# Patient Record
Sex: Female | Born: 1940 | Race: White | Hispanic: No | Marital: Married | Smoking: Never smoker
Health system: Southern US, Community
[De-identification: ages and names within clinical notes are randomized; demographics above are authoritative.]

## PROBLEM LIST (undated history)

## (undated) DIAGNOSIS — I499 Cardiac arrhythmia, unspecified: Secondary | ICD-10-CM

## (undated) DIAGNOSIS — I4891 Unspecified atrial fibrillation: Secondary | ICD-10-CM

## (undated) DIAGNOSIS — D128 Benign neoplasm of rectum: Secondary | ICD-10-CM

## (undated) DIAGNOSIS — Z9221 Personal history of antineoplastic chemotherapy: Secondary | ICD-10-CM

## (undated) DIAGNOSIS — C9510 Chronic leukemia of unspecified cell type not having achieved remission: Secondary | ICD-10-CM

## (undated) DIAGNOSIS — E7800 Pure hypercholesterolemia, unspecified: Secondary | ICD-10-CM

## (undated) DIAGNOSIS — E0590 Thyrotoxicosis, unspecified without thyrotoxic crisis or storm: Secondary | ICD-10-CM

## (undated) DIAGNOSIS — D129 Benign neoplasm of anus and anal canal: Secondary | ICD-10-CM

## (undated) DIAGNOSIS — C8590 Non-Hodgkin lymphoma, unspecified, unspecified site: Secondary | ICD-10-CM

## (undated) DIAGNOSIS — I10 Essential (primary) hypertension: Secondary | ICD-10-CM

## (undated) HISTORY — DX: Essential (primary) hypertension: I10

## (undated) HISTORY — DX: Non-Hodgkin lymphoma, unspecified, unspecified site: C85.90

## (undated) HISTORY — DX: Unspecified atrial fibrillation: I48.91

## (undated) HISTORY — DX: Chronic leukemia of unspecified cell type not having achieved remission: C95.10

## (undated) HISTORY — PX: APPENDECTOMY: SHX54

## (undated) HISTORY — DX: Pure hypercholesterolemia, unspecified: E78.00

## (undated) HISTORY — DX: Thyrotoxicosis, unspecified without thyrotoxic crisis or storm: E05.90

## (undated) HISTORY — DX: Benign neoplasm of anus and anal canal: D12.9

## (undated) HISTORY — DX: Benign neoplasm of rectum: D12.8

## (undated) HISTORY — PX: COLONOSCOPY: SHX174

---
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
## 1968-01-23 HISTORY — PX: BREAST BIOPSY: SHX20

## 1988-01-23 HISTORY — PX: BREAST SURGERY: SHX581

## 1996-01-23 HISTORY — PX: FLEXIBLE SIGMOIDOSCOPY: SHX1649

## 1997-01-22 HISTORY — PX: CORONARY ARTERY BYPASS GRAFT: SHX141

## 1998-01-22 HISTORY — PX: BREAST CYST ASPIRATION: SHX578

## 2000-01-23 HISTORY — PX: BREAST BIOPSY: SHX20

## 2004-08-16 ENCOUNTER — Ambulatory Visit: Payer: Self-pay | Admitting: Family Medicine

## 2004-11-07 ENCOUNTER — Ambulatory Visit: Payer: Self-pay | Admitting: General Surgery

## 2005-01-22 DIAGNOSIS — C8590 Non-Hodgkin lymphoma, unspecified, unspecified site: Secondary | ICD-10-CM

## 2005-01-22 HISTORY — DX: Non-Hodgkin lymphoma, unspecified, unspecified site: C85.90

## 2005-07-06 ENCOUNTER — Other Ambulatory Visit: Payer: Self-pay

## 2005-08-06 ENCOUNTER — Ambulatory Visit: Payer: Self-pay | Admitting: General Surgery

## 2005-08-13 ENCOUNTER — Ambulatory Visit: Payer: Self-pay | Admitting: Oncology

## 2005-08-22 ENCOUNTER — Ambulatory Visit: Payer: Self-pay | Admitting: Oncology

## 2005-09-22 ENCOUNTER — Ambulatory Visit: Payer: Self-pay | Admitting: Oncology

## 2005-10-22 ENCOUNTER — Ambulatory Visit: Payer: Self-pay | Admitting: Oncology

## 2005-11-22 ENCOUNTER — Ambulatory Visit: Payer: Self-pay | Admitting: Oncology

## 2006-01-18 ENCOUNTER — Ambulatory Visit: Payer: Self-pay | Admitting: Nurse Practitioner

## 2006-01-22 DIAGNOSIS — C8590 Non-Hodgkin lymphoma, unspecified, unspecified site: Secondary | ICD-10-CM

## 2006-01-22 HISTORY — DX: Non-Hodgkin lymphoma, unspecified, unspecified site: C85.90

## 2006-02-01 ENCOUNTER — Ambulatory Visit: Payer: Self-pay | Admitting: Oncology

## 2006-02-22 ENCOUNTER — Ambulatory Visit: Payer: Self-pay | Admitting: Oncology

## 2006-04-24 ENCOUNTER — Ambulatory Visit: Payer: Self-pay | Admitting: Oncology

## 2006-04-26 ENCOUNTER — Ambulatory Visit: Payer: Self-pay | Admitting: Oncology

## 2006-05-23 ENCOUNTER — Ambulatory Visit: Payer: Self-pay | Admitting: Oncology

## 2006-06-03 ENCOUNTER — Ambulatory Visit: Payer: Self-pay | Admitting: Family Medicine

## 2006-07-19 ENCOUNTER — Ambulatory Visit: Payer: Self-pay | Admitting: Oncology

## 2006-07-23 ENCOUNTER — Ambulatory Visit: Payer: Self-pay | Admitting: Oncology

## 2006-09-23 ENCOUNTER — Ambulatory Visit: Payer: Self-pay | Admitting: Oncology

## 2006-10-11 ENCOUNTER — Ambulatory Visit: Payer: Self-pay | Admitting: Oncology

## 2006-10-23 ENCOUNTER — Ambulatory Visit: Payer: Self-pay | Admitting: Oncology

## 2006-12-23 ENCOUNTER — Ambulatory Visit: Payer: Self-pay | Admitting: Oncology

## 2007-01-03 ENCOUNTER — Ambulatory Visit: Payer: Self-pay | Admitting: Oncology

## 2007-01-23 ENCOUNTER — Ambulatory Visit: Payer: Self-pay | Admitting: Oncology

## 2007-03-23 ENCOUNTER — Ambulatory Visit: Payer: Self-pay | Admitting: Oncology

## 2007-03-26 ENCOUNTER — Ambulatory Visit: Payer: Self-pay | Admitting: Oncology

## 2007-04-23 ENCOUNTER — Ambulatory Visit: Payer: Self-pay | Admitting: Oncology

## 2007-05-23 ENCOUNTER — Ambulatory Visit: Payer: Self-pay | Admitting: Internal Medicine

## 2007-06-20 ENCOUNTER — Ambulatory Visit: Payer: Self-pay | Admitting: Oncology

## 2007-06-23 ENCOUNTER — Ambulatory Visit: Payer: Self-pay | Admitting: Oncology

## 2007-06-23 ENCOUNTER — Ambulatory Visit: Payer: Self-pay | Admitting: Internal Medicine

## 2007-09-19 ENCOUNTER — Ambulatory Visit: Payer: Self-pay | Admitting: Oncology

## 2007-09-23 ENCOUNTER — Ambulatory Visit: Payer: Self-pay | Admitting: Oncology

## 2007-10-30 ENCOUNTER — Ambulatory Visit: Payer: Self-pay | Admitting: Family Medicine

## 2007-12-01 ENCOUNTER — Ambulatory Visit: Payer: Self-pay | Admitting: General Surgery

## 2007-12-23 ENCOUNTER — Ambulatory Visit: Payer: Self-pay | Admitting: Oncology

## 2008-01-22 ENCOUNTER — Ambulatory Visit: Payer: Self-pay | Admitting: Oncology

## 2008-01-23 ENCOUNTER — Ambulatory Visit: Payer: Self-pay | Admitting: Oncology

## 2008-07-21 ENCOUNTER — Ambulatory Visit: Payer: Self-pay | Admitting: Oncology

## 2008-07-22 ENCOUNTER — Ambulatory Visit: Payer: Self-pay | Admitting: Oncology

## 2008-07-23 ENCOUNTER — Ambulatory Visit: Payer: Self-pay | Admitting: Oncology

## 2008-08-22 ENCOUNTER — Ambulatory Visit: Payer: Self-pay | Admitting: Oncology

## 2008-12-22 ENCOUNTER — Ambulatory Visit: Payer: Self-pay | Admitting: Oncology

## 2008-12-28 ENCOUNTER — Ambulatory Visit: Payer: Self-pay | Admitting: Family Medicine

## 2009-01-10 ENCOUNTER — Ambulatory Visit: Payer: Self-pay | Admitting: Oncology

## 2009-01-22 ENCOUNTER — Ambulatory Visit: Payer: Self-pay | Admitting: Oncology

## 2009-06-22 ENCOUNTER — Ambulatory Visit: Payer: Self-pay | Admitting: Oncology

## 2009-07-11 ENCOUNTER — Ambulatory Visit: Payer: Self-pay | Admitting: Oncology

## 2009-07-22 ENCOUNTER — Ambulatory Visit: Payer: Self-pay | Admitting: Oncology

## 2010-01-10 ENCOUNTER — Ambulatory Visit: Payer: Self-pay | Admitting: Oncology

## 2010-01-22 ENCOUNTER — Ambulatory Visit: Payer: Self-pay | Admitting: Oncology

## 2010-02-20 ENCOUNTER — Ambulatory Visit: Payer: Self-pay | Admitting: Family Medicine

## 2010-02-28 ENCOUNTER — Ambulatory Visit: Payer: Self-pay | Admitting: Family Medicine

## 2010-07-13 ENCOUNTER — Ambulatory Visit: Payer: Self-pay | Admitting: Oncology

## 2010-07-23 ENCOUNTER — Ambulatory Visit: Payer: Self-pay | Admitting: Oncology

## 2010-12-29 ENCOUNTER — Ambulatory Visit: Payer: Self-pay | Admitting: General Surgery

## 2010-12-29 LAB — HM COLONOSCOPY

## 2011-01-01 LAB — PATHOLOGY REPORT

## 2011-01-09 ENCOUNTER — Ambulatory Visit: Payer: Self-pay | Admitting: Oncology

## 2011-01-19 ENCOUNTER — Ambulatory Visit: Payer: Self-pay | Admitting: Oncology

## 2011-01-23 ENCOUNTER — Ambulatory Visit: Payer: Self-pay | Admitting: Oncology

## 2011-02-06 DIAGNOSIS — N83209 Unspecified ovarian cyst, unspecified side: Secondary | ICD-10-CM | POA: Diagnosis not present

## 2011-02-06 DIAGNOSIS — Z7982 Long term (current) use of aspirin: Secondary | ICD-10-CM | POA: Diagnosis not present

## 2011-02-06 DIAGNOSIS — Z79899 Other long term (current) drug therapy: Secondary | ICD-10-CM | POA: Diagnosis not present

## 2011-02-06 DIAGNOSIS — Z90710 Acquired absence of both cervix and uterus: Secondary | ICD-10-CM | POA: Diagnosis not present

## 2011-02-06 DIAGNOSIS — Z5111 Encounter for antineoplastic chemotherapy: Secondary | ICD-10-CM | POA: Diagnosis not present

## 2011-02-06 DIAGNOSIS — N838 Other noninflammatory disorders of ovary, fallopian tube and broad ligament: Secondary | ICD-10-CM | POA: Diagnosis not present

## 2011-02-06 DIAGNOSIS — Z951 Presence of aortocoronary bypass graft: Secondary | ICD-10-CM | POA: Diagnosis not present

## 2011-02-06 DIAGNOSIS — I10 Essential (primary) hypertension: Secondary | ICD-10-CM | POA: Diagnosis not present

## 2011-02-06 DIAGNOSIS — I2510 Atherosclerotic heart disease of native coronary artery without angina pectoris: Secondary | ICD-10-CM | POA: Diagnosis not present

## 2011-02-06 DIAGNOSIS — Z780 Asymptomatic menopausal state: Secondary | ICD-10-CM | POA: Diagnosis not present

## 2011-02-06 DIAGNOSIS — C8595 Non-Hodgkin lymphoma, unspecified, lymph nodes of inguinal region and lower limb: Secondary | ICD-10-CM | POA: Diagnosis not present

## 2011-02-07 DIAGNOSIS — I2510 Atherosclerotic heart disease of native coronary artery without angina pectoris: Secondary | ICD-10-CM | POA: Diagnosis not present

## 2011-02-07 DIAGNOSIS — Z951 Presence of aortocoronary bypass graft: Secondary | ICD-10-CM | POA: Diagnosis not present

## 2011-02-07 DIAGNOSIS — Z5111 Encounter for antineoplastic chemotherapy: Secondary | ICD-10-CM | POA: Diagnosis not present

## 2011-02-07 DIAGNOSIS — N83209 Unspecified ovarian cyst, unspecified side: Secondary | ICD-10-CM | POA: Diagnosis not present

## 2011-02-07 DIAGNOSIS — C8595 Non-Hodgkin lymphoma, unspecified, lymph nodes of inguinal region and lower limb: Secondary | ICD-10-CM | POA: Diagnosis not present

## 2011-02-14 DIAGNOSIS — I2510 Atherosclerotic heart disease of native coronary artery without angina pectoris: Secondary | ICD-10-CM | POA: Diagnosis not present

## 2011-02-14 DIAGNOSIS — N83209 Unspecified ovarian cyst, unspecified side: Secondary | ICD-10-CM | POA: Diagnosis not present

## 2011-02-14 DIAGNOSIS — Z951 Presence of aortocoronary bypass graft: Secondary | ICD-10-CM | POA: Diagnosis not present

## 2011-02-14 DIAGNOSIS — C8595 Non-Hodgkin lymphoma, unspecified, lymph nodes of inguinal region and lower limb: Secondary | ICD-10-CM | POA: Diagnosis not present

## 2011-02-14 DIAGNOSIS — Z5111 Encounter for antineoplastic chemotherapy: Secondary | ICD-10-CM | POA: Diagnosis not present

## 2011-02-14 LAB — CBC CANCER CENTER
Basophil %: 0.9 %
Eosinophil #: 0.1 x10 3/mm (ref 0.0–0.7)
Eosinophil %: 2.1 %
HGB: 13.9 g/dL (ref 12.0–16.0)
Lymphocyte #: 0.9 x10 3/mm — ABNORMAL LOW (ref 1.0–3.6)
MCH: 30.9 pg (ref 26.0–34.0)
MCHC: 34.7 g/dL (ref 32.0–36.0)
MCV: 89 fL (ref 80–100)
Monocyte #: 0.4 x10 3/mm (ref 0.0–0.7)
Monocyte %: 9.9 %
Neutrophil #: 2.7 x10 3/mm (ref 1.4–6.5)
Neutrophil %: 65.6 %
Platelet: 146 x10 3/mm — ABNORMAL LOW (ref 150–440)

## 2011-02-14 LAB — BASIC METABOLIC PANEL
Anion Gap: 6 — ABNORMAL LOW (ref 7–16)
BUN: 20 mg/dL — ABNORMAL HIGH (ref 7–18)
Chloride: 105 mmol/L (ref 98–107)
Creatinine: 1.01 mg/dL (ref 0.60–1.30)
EGFR (Non-African Amer.): 57 — ABNORMAL LOW
Potassium: 3.8 mmol/L (ref 3.5–5.1)
Sodium: 142 mmol/L (ref 136–145)

## 2011-02-21 DIAGNOSIS — I2510 Atherosclerotic heart disease of native coronary artery without angina pectoris: Secondary | ICD-10-CM | POA: Diagnosis not present

## 2011-02-21 DIAGNOSIS — C8595 Non-Hodgkin lymphoma, unspecified, lymph nodes of inguinal region and lower limb: Secondary | ICD-10-CM | POA: Diagnosis not present

## 2011-02-21 DIAGNOSIS — N83209 Unspecified ovarian cyst, unspecified side: Secondary | ICD-10-CM | POA: Diagnosis not present

## 2011-02-21 DIAGNOSIS — Z951 Presence of aortocoronary bypass graft: Secondary | ICD-10-CM | POA: Diagnosis not present

## 2011-02-21 DIAGNOSIS — Z5111 Encounter for antineoplastic chemotherapy: Secondary | ICD-10-CM | POA: Diagnosis not present

## 2011-02-21 LAB — CBC CANCER CENTER
Basophil #: 0 x10 3/mm (ref 0.0–0.1)
Basophil %: 0.7 %
Eosinophil #: 0.1 x10 3/mm (ref 0.0–0.7)
Eosinophil %: 2.3 %
HCT: 38.1 % (ref 35.0–47.0)
HGB: 13.4 g/dL (ref 12.0–16.0)
Lymphocyte #: 1.1 x10 3/mm (ref 1.0–3.6)
Lymphocyte %: 19.7 %
Monocyte %: 8.2 %
Neutrophil #: 3.8 x10 3/mm (ref 1.4–6.5)
RBC: 4.3 10*6/uL (ref 3.80–5.20)
RDW: 14 % (ref 11.5–14.5)
WBC: 5.6 x10 3/mm (ref 3.6–11.0)

## 2011-02-23 ENCOUNTER — Ambulatory Visit: Payer: Self-pay | Admitting: Oncology

## 2011-02-28 ENCOUNTER — Ambulatory Visit: Payer: Self-pay | Admitting: Oncology

## 2011-02-28 DIAGNOSIS — Z7982 Long term (current) use of aspirin: Secondary | ICD-10-CM | POA: Diagnosis not present

## 2011-02-28 DIAGNOSIS — N83209 Unspecified ovarian cyst, unspecified side: Secondary | ICD-10-CM | POA: Diagnosis not present

## 2011-02-28 DIAGNOSIS — I10 Essential (primary) hypertension: Secondary | ICD-10-CM | POA: Diagnosis not present

## 2011-02-28 DIAGNOSIS — Z5111 Encounter for antineoplastic chemotherapy: Secondary | ICD-10-CM | POA: Diagnosis not present

## 2011-02-28 DIAGNOSIS — Z90710 Acquired absence of both cervix and uterus: Secondary | ICD-10-CM | POA: Diagnosis not present

## 2011-02-28 DIAGNOSIS — Z79899 Other long term (current) drug therapy: Secondary | ICD-10-CM | POA: Diagnosis not present

## 2011-02-28 DIAGNOSIS — C8595 Non-Hodgkin lymphoma, unspecified, lymph nodes of inguinal region and lower limb: Secondary | ICD-10-CM | POA: Diagnosis not present

## 2011-02-28 DIAGNOSIS — I2510 Atherosclerotic heart disease of native coronary artery without angina pectoris: Secondary | ICD-10-CM | POA: Diagnosis not present

## 2011-02-28 DIAGNOSIS — Z951 Presence of aortocoronary bypass graft: Secondary | ICD-10-CM | POA: Diagnosis not present

## 2011-02-28 LAB — CBC CANCER CENTER
Eosinophil %: 2.2 %
HCT: 41.1 % (ref 35.0–47.0)
Lymphocyte #: 1.1 x10 3/mm (ref 1.0–3.6)
MCH: 30.8 pg (ref 26.0–34.0)
MCHC: 34.7 g/dL (ref 32.0–36.0)
MCV: 89 fL (ref 80–100)
Monocyte #: 0.4 x10 3/mm (ref 0.0–0.7)
Monocyte %: 7.8 %
Neutrophil #: 3.8 x10 3/mm (ref 1.4–6.5)
Platelet: 190 x10 3/mm (ref 150–440)
RBC: 4.64 10*6/uL (ref 3.80–5.20)
WBC: 5.5 x10 3/mm (ref 3.6–11.0)

## 2011-03-22 ENCOUNTER — Ambulatory Visit: Payer: Self-pay | Admitting: Family Medicine

## 2011-03-22 DIAGNOSIS — R928 Other abnormal and inconclusive findings on diagnostic imaging of breast: Secondary | ICD-10-CM | POA: Diagnosis not present

## 2011-03-22 DIAGNOSIS — Z1231 Encounter for screening mammogram for malignant neoplasm of breast: Secondary | ICD-10-CM | POA: Diagnosis not present

## 2011-03-23 ENCOUNTER — Ambulatory Visit: Payer: Self-pay | Admitting: Oncology

## 2011-04-10 DIAGNOSIS — Z7982 Long term (current) use of aspirin: Secondary | ICD-10-CM | POA: Diagnosis not present

## 2011-04-10 DIAGNOSIS — C8595 Non-Hodgkin lymphoma, unspecified, lymph nodes of inguinal region and lower limb: Secondary | ICD-10-CM | POA: Diagnosis not present

## 2011-04-10 DIAGNOSIS — N9489 Other specified conditions associated with female genital organs and menstrual cycle: Secondary | ICD-10-CM | POA: Diagnosis not present

## 2011-04-10 DIAGNOSIS — I2510 Atherosclerotic heart disease of native coronary artery without angina pectoris: Secondary | ICD-10-CM | POA: Diagnosis not present

## 2011-04-10 DIAGNOSIS — I10 Essential (primary) hypertension: Secondary | ICD-10-CM | POA: Diagnosis not present

## 2011-04-10 DIAGNOSIS — Z79899 Other long term (current) drug therapy: Secondary | ICD-10-CM | POA: Diagnosis not present

## 2011-04-10 DIAGNOSIS — Z780 Asymptomatic menopausal state: Secondary | ICD-10-CM | POA: Diagnosis not present

## 2011-04-10 DIAGNOSIS — Z951 Presence of aortocoronary bypass graft: Secondary | ICD-10-CM | POA: Diagnosis not present

## 2011-04-10 DIAGNOSIS — N838 Other noninflammatory disorders of ovary, fallopian tube and broad ligament: Secondary | ICD-10-CM | POA: Diagnosis not present

## 2011-04-10 DIAGNOSIS — Z90710 Acquired absence of both cervix and uterus: Secondary | ICD-10-CM | POA: Diagnosis not present

## 2011-04-23 ENCOUNTER — Ambulatory Visit: Payer: Self-pay | Admitting: Oncology

## 2011-05-21 DIAGNOSIS — I2510 Atherosclerotic heart disease of native coronary artery without angina pectoris: Secondary | ICD-10-CM | POA: Diagnosis not present

## 2011-05-21 DIAGNOSIS — E7800 Pure hypercholesterolemia, unspecified: Secondary | ICD-10-CM | POA: Diagnosis not present

## 2011-05-21 DIAGNOSIS — Z23 Encounter for immunization: Secondary | ICD-10-CM | POA: Diagnosis not present

## 2011-05-21 DIAGNOSIS — I10 Essential (primary) hypertension: Secondary | ICD-10-CM | POA: Diagnosis not present

## 2011-07-12 ENCOUNTER — Ambulatory Visit: Payer: Self-pay | Admitting: Oncology

## 2011-07-12 DIAGNOSIS — Z9221 Personal history of antineoplastic chemotherapy: Secondary | ICD-10-CM | POA: Diagnosis not present

## 2011-07-12 DIAGNOSIS — I2510 Atherosclerotic heart disease of native coronary artery without angina pectoris: Secondary | ICD-10-CM | POA: Diagnosis not present

## 2011-07-12 DIAGNOSIS — Z79899 Other long term (current) drug therapy: Secondary | ICD-10-CM | POA: Diagnosis not present

## 2011-07-12 DIAGNOSIS — Z7982 Long term (current) use of aspirin: Secondary | ICD-10-CM | POA: Diagnosis not present

## 2011-07-12 DIAGNOSIS — I10 Essential (primary) hypertension: Secondary | ICD-10-CM | POA: Diagnosis not present

## 2011-07-12 DIAGNOSIS — C8595 Non-Hodgkin lymphoma, unspecified, lymph nodes of inguinal region and lower limb: Secondary | ICD-10-CM | POA: Diagnosis not present

## 2011-07-12 DIAGNOSIS — R599 Enlarged lymph nodes, unspecified: Secondary | ICD-10-CM | POA: Diagnosis not present

## 2011-07-12 DIAGNOSIS — Z951 Presence of aortocoronary bypass graft: Secondary | ICD-10-CM | POA: Diagnosis not present

## 2011-07-12 LAB — CBC CANCER CENTER
Eosinophil #: 0.1 x10 3/mm (ref 0.0–0.7)
Eosinophil %: 1.6 %
HCT: 39.3 % (ref 35.0–47.0)
HGB: 13.1 g/dL (ref 12.0–16.0)
Lymphocyte #: 0.9 x10 3/mm — ABNORMAL LOW (ref 1.0–3.6)
Lymphocyte %: 24.4 %
MCH: 29.7 pg (ref 26.0–34.0)
MCHC: 33.4 g/dL (ref 32.0–36.0)
Monocyte #: 0.4 x10 3/mm (ref 0.2–0.9)
Monocyte %: 10.5 %
Neutrophil #: 2.4 x10 3/mm (ref 1.4–6.5)
Platelet: 122 x10 3/mm — ABNORMAL LOW (ref 150–440)
RDW: 13.8 % (ref 11.5–14.5)
WBC: 3.8 x10 3/mm (ref 3.6–11.0)

## 2011-07-12 LAB — COMPREHENSIVE METABOLIC PANEL
Albumin: 3.9 g/dL (ref 3.4–5.0)
Alkaline Phosphatase: 57 U/L (ref 50–136)
Anion Gap: 5 — ABNORMAL LOW (ref 7–16)
Bilirubin,Total: 0.6 mg/dL (ref 0.2–1.0)
Calcium, Total: 10.1 mg/dL (ref 8.5–10.1)
Co2: 30 mmol/L (ref 21–32)
Creatinine: 0.95 mg/dL (ref 0.60–1.30)
EGFR (African American): >60
Glucose: 123 mg/dL — ABNORMAL HIGH (ref 65–99)
Potassium: 3.7 mmol/L (ref 3.5–5.1)
SGOT(AST): 29 U/L (ref 15–37)
SGPT (ALT): 31 U/L
Sodium: 141 mmol/L (ref 136–145)

## 2011-07-12 LAB — SEDIMENTATION RATE: Erythrocyte Sed Rate: 3 mm/hr (ref 0–30)

## 2011-07-23 ENCOUNTER — Ambulatory Visit: Payer: Self-pay | Admitting: Oncology

## 2011-07-24 DIAGNOSIS — I10 Essential (primary) hypertension: Secondary | ICD-10-CM | POA: Diagnosis not present

## 2011-07-24 DIAGNOSIS — C8295 Follicular lymphoma, unspecified, lymph nodes of inguinal region and lower limb: Secondary | ICD-10-CM | POA: Diagnosis not present

## 2011-07-24 DIAGNOSIS — I2510 Atherosclerotic heart disease of native coronary artery without angina pectoris: Secondary | ICD-10-CM | POA: Diagnosis not present

## 2011-07-24 DIAGNOSIS — Z5111 Encounter for antineoplastic chemotherapy: Secondary | ICD-10-CM | POA: Diagnosis not present

## 2011-07-24 DIAGNOSIS — G479 Sleep disorder, unspecified: Secondary | ICD-10-CM | POA: Diagnosis not present

## 2011-07-24 DIAGNOSIS — Z7982 Long term (current) use of aspirin: Secondary | ICD-10-CM | POA: Diagnosis not present

## 2011-07-24 DIAGNOSIS — Z79899 Other long term (current) drug therapy: Secondary | ICD-10-CM | POA: Diagnosis not present

## 2011-08-16 DIAGNOSIS — Z5111 Encounter for antineoplastic chemotherapy: Secondary | ICD-10-CM | POA: Diagnosis not present

## 2011-08-16 DIAGNOSIS — G479 Sleep disorder, unspecified: Secondary | ICD-10-CM | POA: Diagnosis not present

## 2011-08-16 DIAGNOSIS — C8295 Follicular lymphoma, unspecified, lymph nodes of inguinal region and lower limb: Secondary | ICD-10-CM | POA: Diagnosis not present

## 2011-08-16 DIAGNOSIS — I10 Essential (primary) hypertension: Secondary | ICD-10-CM | POA: Diagnosis not present

## 2011-08-16 DIAGNOSIS — Z7982 Long term (current) use of aspirin: Secondary | ICD-10-CM | POA: Diagnosis not present

## 2011-08-16 DIAGNOSIS — I2510 Atherosclerotic heart disease of native coronary artery without angina pectoris: Secondary | ICD-10-CM | POA: Diagnosis not present

## 2011-08-16 LAB — BASIC METABOLIC PANEL
Anion Gap: 9 (ref 7–16)
BUN: 38 mg/dL — ABNORMAL HIGH (ref 7–18)
Chloride: 103 mmol/L (ref 98–107)
Co2: 30 mmol/L (ref 21–32)
Creatinine: 1.17 mg/dL (ref 0.60–1.30)
Potassium: 4.1 mmol/L (ref 3.5–5.1)
Sodium: 142 mmol/L (ref 136–145)

## 2011-08-16 LAB — CBC CANCER CENTER
Basophil #: 0 x10 3/mm (ref 0.0–0.1)
Basophil %: 0.8 %
Eosinophil %: 2.4 %
HGB: 13.8 g/dL (ref 12.0–16.0)
Lymphocyte #: 1.1 x10 3/mm (ref 1.0–3.6)
Lymphocyte %: 24.6 %
MCH: 30.6 pg (ref 26.0–34.0)
MCV: 89 fL (ref 80–100)
Monocyte #: 0.4 x10 3/mm (ref 0.2–0.9)
Monocyte %: 10.1 %
Neutrophil %: 62.1 %
Platelet: 151 x10 3/mm (ref 150–440)
RBC: 4.5 10*6/uL (ref 3.80–5.20)

## 2011-08-23 ENCOUNTER — Ambulatory Visit: Payer: Self-pay | Admitting: Oncology

## 2011-10-18 ENCOUNTER — Ambulatory Visit: Payer: Self-pay | Admitting: Oncology

## 2011-10-18 DIAGNOSIS — Z951 Presence of aortocoronary bypass graft: Secondary | ICD-10-CM | POA: Diagnosis not present

## 2011-10-18 DIAGNOSIS — Z79899 Other long term (current) drug therapy: Secondary | ICD-10-CM | POA: Diagnosis not present

## 2011-10-18 DIAGNOSIS — Z5111 Encounter for antineoplastic chemotherapy: Secondary | ICD-10-CM | POA: Diagnosis not present

## 2011-10-18 DIAGNOSIS — I2510 Atherosclerotic heart disease of native coronary artery without angina pectoris: Secondary | ICD-10-CM | POA: Diagnosis not present

## 2011-10-18 DIAGNOSIS — C8295 Follicular lymphoma, unspecified, lymph nodes of inguinal region and lower limb: Secondary | ICD-10-CM | POA: Diagnosis not present

## 2011-10-18 DIAGNOSIS — I10 Essential (primary) hypertension: Secondary | ICD-10-CM | POA: Diagnosis not present

## 2011-10-18 DIAGNOSIS — Z7982 Long term (current) use of aspirin: Secondary | ICD-10-CM | POA: Diagnosis not present

## 2011-10-18 LAB — CBC CANCER CENTER
Basophil #: 0 x10 3/mm (ref 0.0–0.1)
Basophil %: 1.1 %
Eosinophil #: 0.1 x10 3/mm (ref 0.0–0.7)
Eosinophil %: 2.6 %
HGB: 12.9 g/dL (ref 12.0–16.0)
Lymphocyte %: 23.9 %
MCHC: 32.9 g/dL (ref 32.0–36.0)
Monocyte #: 0.5 x10 3/mm (ref 0.2–0.9)
Monocyte %: 11.5 %
Neutrophil #: 2.6 x10 3/mm (ref 1.4–6.5)
Platelet: 131 x10 3/mm — ABNORMAL LOW (ref 150–440)
RDW: 14 % (ref 11.5–14.5)
WBC: 4.2 x10 3/mm (ref 3.6–11.0)

## 2011-10-18 LAB — COMPREHENSIVE METABOLIC PANEL
Anion Gap: 6 — ABNORMAL LOW (ref 7–16)
Calcium, Total: 9.9 mg/dL (ref 8.5–10.1)
Chloride: 105 mmol/L (ref 98–107)
Co2: 28 mmol/L (ref 21–32)
EGFR (African American): 55 — ABNORMAL LOW
EGFR (Non-African Amer.): 47 — ABNORMAL LOW
SGOT(AST): 29 U/L (ref 15–37)
SGPT (ALT): 35 U/L (ref 12–78)
Sodium: 139 mmol/L (ref 136–145)

## 2011-10-18 LAB — SEDIMENTATION RATE: Erythrocyte Sed Rate: 4 mm/hr (ref 0–30)

## 2011-10-18 LAB — LACTATE DEHYDROGENASE: LDH: 193 U/L (ref 81–246)

## 2011-10-23 ENCOUNTER — Ambulatory Visit: Payer: Self-pay | Admitting: Oncology

## 2011-11-20 DIAGNOSIS — I2510 Atherosclerotic heart disease of native coronary artery without angina pectoris: Secondary | ICD-10-CM | POA: Diagnosis not present

## 2011-11-20 DIAGNOSIS — Z0000 Encounter for general adult medical examination without abnormal findings: Secondary | ICD-10-CM | POA: Diagnosis not present

## 2011-11-20 DIAGNOSIS — Z1339 Encounter for screening examination for other mental health and behavioral disorders: Secondary | ICD-10-CM | POA: Diagnosis not present

## 2011-11-20 DIAGNOSIS — I10 Essential (primary) hypertension: Secondary | ICD-10-CM | POA: Diagnosis not present

## 2011-11-20 DIAGNOSIS — Z23 Encounter for immunization: Secondary | ICD-10-CM | POA: Diagnosis not present

## 2011-11-20 DIAGNOSIS — Z1331 Encounter for screening for depression: Secondary | ICD-10-CM | POA: Diagnosis not present

## 2011-12-17 DIAGNOSIS — Z1331 Encounter for screening for depression: Secondary | ICD-10-CM | POA: Diagnosis not present

## 2011-12-17 DIAGNOSIS — Z23 Encounter for immunization: Secondary | ICD-10-CM | POA: Diagnosis not present

## 2011-12-27 ENCOUNTER — Ambulatory Visit: Payer: Self-pay | Admitting: Oncology

## 2011-12-27 DIAGNOSIS — Z5111 Encounter for antineoplastic chemotherapy: Secondary | ICD-10-CM | POA: Diagnosis not present

## 2011-12-27 DIAGNOSIS — C8295 Follicular lymphoma, unspecified, lymph nodes of inguinal region and lower limb: Secondary | ICD-10-CM | POA: Diagnosis not present

## 2011-12-27 DIAGNOSIS — Z79899 Other long term (current) drug therapy: Secondary | ICD-10-CM | POA: Diagnosis not present

## 2011-12-27 DIAGNOSIS — I2510 Atherosclerotic heart disease of native coronary artery without angina pectoris: Secondary | ICD-10-CM | POA: Diagnosis not present

## 2011-12-27 DIAGNOSIS — Z90710 Acquired absence of both cervix and uterus: Secondary | ICD-10-CM | POA: Diagnosis not present

## 2011-12-27 DIAGNOSIS — I10 Essential (primary) hypertension: Secondary | ICD-10-CM | POA: Diagnosis not present

## 2011-12-27 LAB — COMPREHENSIVE METABOLIC PANEL
Albumin: 3.9 g/dL (ref 3.4–5.0)
Anion Gap: 10 (ref 7–16)
BUN: 18 mg/dL (ref 7–18)
Calcium, Total: 10.4 mg/dL — ABNORMAL HIGH (ref 8.5–10.1)
Co2: 28 mmol/L (ref 21–32)
Creatinine: 1.02 mg/dL (ref 0.60–1.30)
EGFR (African American): >60
Glucose: 118 mg/dL — ABNORMAL HIGH (ref 65–99)
Osmolality: 286 (ref 275–301)
Potassium: 3.7 mmol/L (ref 3.5–5.1)
SGOT(AST): 30 U/L (ref 15–37)
Sodium: 142 mmol/L (ref 136–145)

## 2011-12-27 LAB — CBC CANCER CENTER
Basophil #: 0.1 x10 3/mm (ref 0.0–0.1)
Basophil %: 1 %
Eosinophil #: 0.1 x10 3/mm (ref 0.0–0.7)
HCT: 39.6 % (ref 35.0–47.0)
HGB: 13.8 g/dL (ref 12.0–16.0)
Lymphocyte #: 1.1 x10 3/mm (ref 1.0–3.6)
Lymphocyte %: 23.1 %
MCH: 30.7 pg (ref 26.0–34.0)
MCHC: 34.8 g/dL (ref 32.0–36.0)
MCV: 88 fL (ref 80–100)
Monocyte #: 0.4 x10 3/mm (ref 0.2–0.9)
Neutrophil #: 3.2 x10 3/mm (ref 1.4–6.5)
RDW: 14.1 % (ref 11.5–14.5)
WBC: 4.9 x10 3/mm (ref 3.6–11.0)

## 2011-12-27 LAB — SEDIMENTATION RATE: Erythrocyte Sed Rate: 4 mm/hr (ref 0–30)

## 2012-01-21 DIAGNOSIS — I10 Essential (primary) hypertension: Secondary | ICD-10-CM | POA: Diagnosis not present

## 2012-01-21 DIAGNOSIS — I059 Rheumatic mitral valve disease, unspecified: Secondary | ICD-10-CM | POA: Diagnosis not present

## 2012-01-21 DIAGNOSIS — E782 Mixed hyperlipidemia: Secondary | ICD-10-CM | POA: Diagnosis not present

## 2012-01-21 DIAGNOSIS — I25810 Atherosclerosis of coronary artery bypass graft(s) without angina pectoris: Secondary | ICD-10-CM | POA: Diagnosis not present

## 2012-01-23 ENCOUNTER — Ambulatory Visit: Payer: Self-pay | Admitting: Oncology

## 2012-01-23 HISTORY — PX: CARDIAC CATHETERIZATION: SHX172

## 2012-01-23 HISTORY — PX: BREAST BIOPSY: SHX20

## 2012-02-23 ENCOUNTER — Ambulatory Visit: Payer: Self-pay | Admitting: Oncology

## 2012-02-23 DIAGNOSIS — Z5111 Encounter for antineoplastic chemotherapy: Secondary | ICD-10-CM | POA: Diagnosis not present

## 2012-02-23 DIAGNOSIS — I2510 Atherosclerotic heart disease of native coronary artery without angina pectoris: Secondary | ICD-10-CM | POA: Diagnosis not present

## 2012-02-23 DIAGNOSIS — Z79899 Other long term (current) drug therapy: Secondary | ICD-10-CM | POA: Diagnosis not present

## 2012-02-23 DIAGNOSIS — Z7982 Long term (current) use of aspirin: Secondary | ICD-10-CM | POA: Diagnosis not present

## 2012-02-23 DIAGNOSIS — C8295 Follicular lymphoma, unspecified, lymph nodes of inguinal region and lower limb: Secondary | ICD-10-CM | POA: Diagnosis not present

## 2012-02-23 DIAGNOSIS — Z90710 Acquired absence of both cervix and uterus: Secondary | ICD-10-CM | POA: Diagnosis not present

## 2012-02-23 DIAGNOSIS — I10 Essential (primary) hypertension: Secondary | ICD-10-CM | POA: Diagnosis not present

## 2012-02-28 DIAGNOSIS — Z90710 Acquired absence of both cervix and uterus: Secondary | ICD-10-CM | POA: Diagnosis not present

## 2012-02-28 DIAGNOSIS — C8295 Follicular lymphoma, unspecified, lymph nodes of inguinal region and lower limb: Secondary | ICD-10-CM | POA: Diagnosis not present

## 2012-02-28 LAB — COMPREHENSIVE METABOLIC PANEL
Albumin: 4 g/dL (ref 3.4–5.0)
Anion Gap: 7 (ref 7–16)
Calcium, Total: 10.2 mg/dL — ABNORMAL HIGH (ref 8.5–10.1)
Chloride: 103 mmol/L (ref 98–107)
Creatinine: 0.98 mg/dL (ref 0.60–1.30)
EGFR (African American): >60
EGFR (Non-African Amer.): 57 — ABNORMAL LOW
Glucose: 96 mg/dL (ref 65–99)
Osmolality: 284 (ref 275–301)
Potassium: 3.9 mmol/L (ref 3.5–5.1)
SGPT (ALT): 27 U/L (ref 12–78)
Total Protein: 7.5 g/dL (ref 6.4–8.2)

## 2012-02-28 LAB — CBC CANCER CENTER
Basophil #: 0 x10 3/mm (ref 0.0–0.1)
Eosinophil %: 1.7 %
HCT: 42.1 % (ref 35.0–47.0)
HGB: 14.5 g/dL (ref 12.0–16.0)
Lymphocyte #: 1.1 x10 3/mm (ref 1.0–3.6)
Lymphocyte %: 25 %
MCHC: 34.6 g/dL (ref 32.0–36.0)
Monocyte #: 0.4 x10 3/mm (ref 0.2–0.9)
Monocyte %: 9.7 %
Neutrophil #: 2.6 x10 3/mm (ref 1.4–6.5)
Platelet: 148 x10 3/mm — ABNORMAL LOW (ref 150–440)
RBC: 4.84 10*6/uL (ref 3.80–5.20)
WBC: 4.2 x10 3/mm (ref 3.6–11.0)

## 2012-03-22 ENCOUNTER — Ambulatory Visit: Payer: Self-pay | Admitting: Oncology

## 2012-04-16 ENCOUNTER — Ambulatory Visit: Payer: Self-pay | Admitting: Family Medicine

## 2012-04-16 DIAGNOSIS — Z1231 Encounter for screening mammogram for malignant neoplasm of breast: Secondary | ICD-10-CM | POA: Diagnosis not present

## 2012-04-16 DIAGNOSIS — N6459 Other signs and symptoms in breast: Secondary | ICD-10-CM | POA: Diagnosis not present

## 2012-04-22 ENCOUNTER — Ambulatory Visit: Payer: Self-pay | Admitting: Oncology

## 2012-04-22 DIAGNOSIS — Z90710 Acquired absence of both cervix and uterus: Secondary | ICD-10-CM | POA: Diagnosis not present

## 2012-04-22 DIAGNOSIS — Z951 Presence of aortocoronary bypass graft: Secondary | ICD-10-CM | POA: Diagnosis not present

## 2012-04-22 DIAGNOSIS — Z7982 Long term (current) use of aspirin: Secondary | ICD-10-CM | POA: Diagnosis not present

## 2012-04-22 DIAGNOSIS — Z79899 Other long term (current) drug therapy: Secondary | ICD-10-CM | POA: Diagnosis not present

## 2012-04-22 DIAGNOSIS — Z5111 Encounter for antineoplastic chemotherapy: Secondary | ICD-10-CM | POA: Diagnosis not present

## 2012-04-22 DIAGNOSIS — R928 Other abnormal and inconclusive findings on diagnostic imaging of breast: Secondary | ICD-10-CM | POA: Diagnosis not present

## 2012-04-22 DIAGNOSIS — N6489 Other specified disorders of breast: Secondary | ICD-10-CM | POA: Diagnosis not present

## 2012-04-22 DIAGNOSIS — I10 Essential (primary) hypertension: Secondary | ICD-10-CM | POA: Diagnosis not present

## 2012-04-22 DIAGNOSIS — I2510 Atherosclerotic heart disease of native coronary artery without angina pectoris: Secondary | ICD-10-CM | POA: Diagnosis not present

## 2012-04-22 DIAGNOSIS — C8295 Follicular lymphoma, unspecified, lymph nodes of inguinal region and lower limb: Secondary | ICD-10-CM | POA: Diagnosis not present

## 2012-05-01 ENCOUNTER — Ambulatory Visit: Payer: Self-pay | Admitting: Family Medicine

## 2012-05-01 DIAGNOSIS — N6489 Other specified disorders of breast: Secondary | ICD-10-CM | POA: Diagnosis not present

## 2012-05-01 DIAGNOSIS — R928 Other abnormal and inconclusive findings on diagnostic imaging of breast: Secondary | ICD-10-CM | POA: Diagnosis not present

## 2012-05-01 DIAGNOSIS — N63 Unspecified lump in unspecified breast: Secondary | ICD-10-CM | POA: Diagnosis not present

## 2012-05-01 DIAGNOSIS — C8295 Follicular lymphoma, unspecified, lymph nodes of inguinal region and lower limb: Secondary | ICD-10-CM | POA: Diagnosis not present

## 2012-05-01 LAB — COMPREHENSIVE METABOLIC PANEL
Albumin: 3.9 g/dL (ref 3.4–5.0)
BUN: 24 mg/dL — ABNORMAL HIGH (ref 7–18)
Bilirubin,Total: 0.5 mg/dL (ref 0.2–1.0)
Calcium, Total: 9.9 mg/dL (ref 8.5–10.1)
Chloride: 102 mmol/L (ref 98–107)
EGFR (Non-African Amer.): 47 — ABNORMAL LOW
Glucose: 109 mg/dL — ABNORMAL HIGH (ref 65–99)
Osmolality: 282 (ref 275–301)
Potassium: 3.9 mmol/L (ref 3.5–5.1)
SGPT (ALT): 27 U/L (ref 12–78)
Total Protein: 7.6 g/dL (ref 6.4–8.2)

## 2012-05-01 LAB — CBC CANCER CENTER
Basophil #: 0 x10 3/mm (ref 0.0–0.1)
Basophil %: 1.1 %
Eosinophil %: 1.3 %
Lymphocyte #: 1.3 x10 3/mm (ref 1.0–3.6)
MCH: 30.1 pg (ref 26.0–34.0)
MCHC: 34.4 g/dL (ref 32.0–36.0)
MCV: 88 fL (ref 80–100)
Monocyte #: 0.4 x10 3/mm (ref 0.2–0.9)
Neutrophil %: 59.9 %
Platelet: 201 x10 3/mm (ref 150–440)
RBC: 4.73 10*6/uL (ref 3.80–5.20)
RDW: 13.8 % (ref 11.5–14.5)
WBC: 4.6 x10 3/mm (ref 3.6–11.0)

## 2012-05-08 ENCOUNTER — Ambulatory Visit: Payer: Medicare Other

## 2012-05-08 ENCOUNTER — Other Ambulatory Visit: Payer: Self-pay | Admitting: *Deleted

## 2012-05-08 ENCOUNTER — Encounter: Payer: Self-pay | Admitting: General Surgery

## 2012-05-08 ENCOUNTER — Ambulatory Visit (INDEPENDENT_AMBULATORY_CARE_PROVIDER_SITE_OTHER): Payer: Medicare Other | Admitting: General Surgery

## 2012-05-08 VITALS — BP 132/68 | HR 76 | Resp 14 | Ht 65.0 in | Wt 157.0 lb

## 2012-05-08 DIAGNOSIS — N6029 Fibroadenosis of unspecified breast: Secondary | ICD-10-CM | POA: Diagnosis not present

## 2012-05-08 DIAGNOSIS — N6310 Unspecified lump in the right breast, unspecified quadrant: Secondary | ICD-10-CM

## 2012-05-08 DIAGNOSIS — N6019 Diffuse cystic mastopathy of unspecified breast: Secondary | ICD-10-CM | POA: Diagnosis not present

## 2012-05-08 DIAGNOSIS — N6009 Solitary cyst of unspecified breast: Secondary | ICD-10-CM | POA: Diagnosis not present

## 2012-05-08 DIAGNOSIS — N63 Unspecified lump in unspecified breast: Secondary | ICD-10-CM | POA: Diagnosis not present

## 2012-05-08 DIAGNOSIS — D249 Benign neoplasm of unspecified breast: Secondary | ICD-10-CM | POA: Diagnosis not present

## 2012-05-08 HISTORY — PX: BREAST SURGERY: SHX581

## 2012-05-08 NOTE — Progress Notes (Signed)
The patient has been asked to return to the office in six months for a unilateral right breast diagnostic mammogram. 

## 2012-05-08 NOTE — Patient Instructions (Addendum)
Continue to do self breast exams. Call for any new breast changes or concerns. The breast biopsy procedure was reviewed with the patient. The potential for bleeding, infection, and pain was reviewed. At this time, the benefits outweigh the risk and the patient is amenable to proceed.     CARE AFTER BREAST BIOPSY  1. Leave the dressing on that your doctor applied after surgery. It is waterproof. You may bathe, shower and/or swim. The dressing will probably remain intact until your return office visit. If the dressing comes off, you will see small strips of tape against your skin on the incision. Do not remove these strips.  2. You may want to use a gauze,cloth or similar protection in your bra to prevent rubbing against your dressing and incision. This is not necessary, but you may feel more comfortable doing so.  3. It is recommended that you wear a bra day and night to give support to the breast. This will prevent the weight of the breast from pulling on the incision.  4. Your breast will feel hard and lumpy under the incision. Do not be alarmed. This is the underlying stitching of tissue. Softening of this tissue will occur in time.  5. Make sure you call the office and schedule an appointment in one week after your surgery. The office phone number is [phone]. The [facility] may have already done this for you.  6. You will notice about a week after your office visit that the strips of the tape on your incision will begin to loosen. These may then be removed.  7. Report to your doctor any of the following:  * Severe pain not relieved by your pain medication  *Redness of the incision  * Drainage from the incision  *Fever greater than 101 degrees

## 2012-05-08 NOTE — Progress Notes (Signed)
Patient ID: Jacqueline Webb, female   DOB: [birth year], 72 y.o.   MRN: [id number]  Chief Complaint  Patient presents with  . Other    mammogram    HPI Jacqueline Webb is a 72 y.o. female here today for an abnormal mammogram done on 05-01-12.  States she really "can't feel anything" in the breast. An ultrasound was done as well.   HPI  Past Medical History  Diagnosis Date  . Hypertension   . Hypercholesteremia   . Lymphoma 2007    Past Surgical History  Procedure Laterality Date  . Appendectomy    . Colonoscopy  2009, 2012    Jacqueline Webb  . Abdominal hysterectomy  1958  . Breast surgery Left 1990    biopsy  . Flexible sigmoidoscopy  1998  . Coronary artery bypass graft  1999    No family history on file.  Social History History  Substance Use Topics  . Smoking status: Never Smoker   . Smokeless tobacco: Not on file  . Alcohol Use: Yes    No Known Allergies  Current Outpatient Prescriptions  Medication Sig Dispense Refill  . aspirin 81 MG tablet Take 81 mg by mouth daily.      [name] levothyroxine (SYNTHROID, LEVOTHROID) 100 MCG tablet Take 100 mcg by mouth daily before breakfast.      . lisinopril-hydrochlorothiazide (PRINZIDE,ZESTORETIC) 20-12.5 MG per tablet Take 1 tablet by mouth daily.      [name] RASPBERRY KETONES PO Take 100 mg by mouth daily.      . simvastatin (ZOCOR) 40 MG tablet Take 40 mg by mouth every evening.       No current facility-administered medications for this visit.    Review of Systems Review of Systems  Constitutional: Negative.   Respiratory: Negative.   Cardiovascular: Negative.     Blood pressure 132/68, pulse 76, resp. rate 14, height 5\' 5"  (1.651 m), weight 157 lb (71.215 kg).  Physical Exam Physical Exam  Constitutional: She is oriented to person, place, and time. She appears well-developed and well-nourished.  Cardiovascular: Normal rate and regular rhythm.   Pulmonary/Chest: Effort normal and breath sounds normal. Right breast  exhibits no inverted nipple, no mass, no nipple discharge, no skin change and no tenderness. Left breast exhibits no inverted nipple, no mass, no nipple discharge, no skin change and no tenderness.  Neurological: She is alert and oriented to person, place, and time.  Skin: Skin is warm and dry.    Data Reviewed  Right breast mammogram dated May 01, 2012 as well as the associated ultrasound was reviewed. There is a hypoechoic mass at the 10:00 positional right breast. BI-RAD-4.  Ultrasound examination of the upper outer quadrant of the right breast showed multiple small cysts. The index lesion at the 10:00 position, 4 cm from the nipple showed a 0.5 x 0.65 x 0 point a 1 cm hypoechoic mass with internal echoes.  The patient was amenable to a vacuum-assisted biopsy. 10 cc of 0.5% Xylocaine with 0.25% Marcaine with 1-200,000 units of epinephrine was utilized a well tolerated. The 14-gauge Vasculitis was then passed under ultrasound guidance to the area and multiple planes. A core samples were obtained. No bleeding was evident. A postbiopsy clip was placed. The skin defect was closed with benzoin and Steri-Strips followed by Telfa and Tegaderm dressing. Written instructions were provided to the patient regarding postoperative wound care.  Assessment    Abnormal right breast mammogram.     Plan    The patient  will becontacted when the pathology is available.        Jacqueline Webb 05/09/2012, 8:21 PM

## 2012-05-09 DIAGNOSIS — N6310 Unspecified lump in the right breast, unspecified quadrant: Secondary | ICD-10-CM | POA: Insufficient documentation

## 2012-05-13 ENCOUNTER — Telehealth: Payer: Self-pay | Admitting: *Deleted

## 2012-05-13 LAB — PATHOLOGY

## 2012-05-13 NOTE — Telephone Encounter (Signed)
Message copied by Currie Paris on Tue May 13, 2012  2:18 PM ------      Message from: [place], [place]      Created: Tue May 13, 2012 12:47 PM       Please notify the biopsy was fine.  Arrange for a f/u exam in six months with a mammogram the day of the appt, office u/s to follow. Thanks. ------

## 2012-05-13 NOTE — Telephone Encounter (Signed)
The patient is aware to call back for any questions or concerns. Notified patient as instructed, patient pleased. Discussed follow-up appointments, patient agrees

## 2012-05-14 NOTE — Progress Notes (Signed)
Quick Note:  Pathology showed a small complex fibroadenoma with benign adjacent breast tissue. This would correspond with the rubbery texture appreciated on core biopsy.  Arrangements were made for a followup right breast mammogram and office visit in 6 months.  CC: Julieanne Manson, M.D. ______

## 2012-05-19 ENCOUNTER — Ambulatory Visit (INDEPENDENT_AMBULATORY_CARE_PROVIDER_SITE_OTHER): Payer: Medicare Other | Admitting: *Deleted

## 2012-05-19 DIAGNOSIS — N63 Unspecified lump in unspecified breast: Secondary | ICD-10-CM

## 2012-05-19 NOTE — Progress Notes (Signed)
Patient here today for follow up post right breast biopsy.  Dressing and steristrip was already removed.  Minimal bruising noted.  The patient is aware that a heating pad may be used for comfort as needed.  Aware of pathology. Follow up as scheduled.

## 2012-05-19 NOTE — Patient Instructions (Addendum)
Patient to return as scheduled.  

## 2012-05-22 ENCOUNTER — Ambulatory Visit: Payer: Self-pay | Admitting: Oncology

## 2012-06-09 DIAGNOSIS — I2510 Atherosclerotic heart disease of native coronary artery without angina pectoris: Secondary | ICD-10-CM | POA: Diagnosis not present

## 2012-06-09 DIAGNOSIS — E7800 Pure hypercholesterolemia, unspecified: Secondary | ICD-10-CM | POA: Diagnosis not present

## 2012-06-09 DIAGNOSIS — C8589 Other specified types of non-Hodgkin lymphoma, extranodal and solid organ sites: Secondary | ICD-10-CM | POA: Diagnosis not present

## 2012-06-09 DIAGNOSIS — I10 Essential (primary) hypertension: Secondary | ICD-10-CM | POA: Diagnosis not present

## 2012-06-17 DIAGNOSIS — E039 Hypothyroidism, unspecified: Secondary | ICD-10-CM | POA: Diagnosis not present

## 2012-06-17 DIAGNOSIS — E7800 Pure hypercholesterolemia, unspecified: Secondary | ICD-10-CM | POA: Diagnosis not present

## 2012-06-17 DIAGNOSIS — I10 Essential (primary) hypertension: Secondary | ICD-10-CM | POA: Diagnosis not present

## 2012-06-22 ENCOUNTER — Ambulatory Visit: Payer: Self-pay | Admitting: Oncology

## 2012-06-22 DIAGNOSIS — Z79899 Other long term (current) drug therapy: Secondary | ICD-10-CM | POA: Diagnosis not present

## 2012-06-22 DIAGNOSIS — C8299 Follicular lymphoma, unspecified, extranodal and solid organ sites: Secondary | ICD-10-CM | POA: Diagnosis not present

## 2012-06-22 DIAGNOSIS — Z5111 Encounter for antineoplastic chemotherapy: Secondary | ICD-10-CM | POA: Diagnosis not present

## 2012-06-22 DIAGNOSIS — I2510 Atherosclerotic heart disease of native coronary artery without angina pectoris: Secondary | ICD-10-CM | POA: Diagnosis not present

## 2012-06-22 DIAGNOSIS — I10 Essential (primary) hypertension: Secondary | ICD-10-CM | POA: Diagnosis not present

## 2012-06-22 DIAGNOSIS — Z951 Presence of aortocoronary bypass graft: Secondary | ICD-10-CM | POA: Diagnosis not present

## 2012-06-22 DIAGNOSIS — R5381 Other malaise: Secondary | ICD-10-CM | POA: Diagnosis not present

## 2012-06-22 DIAGNOSIS — R109 Unspecified abdominal pain: Secondary | ICD-10-CM | POA: Diagnosis not present

## 2012-07-03 DIAGNOSIS — C8299 Follicular lymphoma, unspecified, extranodal and solid organ sites: Secondary | ICD-10-CM | POA: Diagnosis not present

## 2012-07-03 LAB — COMPREHENSIVE METABOLIC PANEL
Albumin: 3.9 g/dL (ref 3.4–5.0)
Alkaline Phosphatase: 61 U/L (ref 50–136)
Anion Gap: 6 — ABNORMAL LOW (ref 7–16)
BUN: 28 mg/dL — ABNORMAL HIGH (ref 7–18)
Bilirubin,Total: 0.7 mg/dL (ref 0.2–1.0)
Calcium, Total: 10.3 mg/dL — ABNORMAL HIGH (ref 8.5–10.1)
Chloride: 102 mmol/L (ref 98–107)
Co2: 31 mmol/L (ref 21–32)
Creatinine: 1.13 mg/dL (ref 0.60–1.30)
Glucose: 132 mg/dL — ABNORMAL HIGH (ref 65–99)
Osmolality: 285 (ref 275–301)
Potassium: 3.6 mmol/L (ref 3.5–5.1)
SGOT(AST): 29 U/L (ref 15–37)
SGPT (ALT): 39 U/L (ref 12–78)
Sodium: 139 mmol/L (ref 136–145)
Total Protein: 7.2 g/dL (ref 6.4–8.2)

## 2012-07-03 LAB — CBC CANCER CENTER
Basophil #: 0.1 x10 3/mm (ref 0.0–0.1)
Eosinophil #: 0.1 x10 3/mm (ref 0.0–0.7)
Lymphocyte #: 1.3 x10 3/mm (ref 1.0–3.6)
Lymphocyte %: 26.5 %
MCV: 88 fL (ref 80–100)
Neutrophil %: 60 %
Platelet: 179 x10 3/mm (ref 150–440)
RBC: 4.61 10*6/uL (ref 3.80–5.20)
RDW: 13.6 % (ref 11.5–14.5)
WBC: 5 x10 3/mm (ref 3.6–11.0)

## 2012-07-22 ENCOUNTER — Ambulatory Visit: Payer: Self-pay | Admitting: Oncology

## 2012-08-07 ENCOUNTER — Encounter: Payer: Self-pay | Admitting: *Deleted

## 2012-08-22 ENCOUNTER — Ambulatory Visit: Payer: Self-pay | Admitting: Oncology

## 2012-08-22 DIAGNOSIS — C8299 Follicular lymphoma, unspecified, extranodal and solid organ sites: Secondary | ICD-10-CM | POA: Diagnosis not present

## 2012-08-22 DIAGNOSIS — R109 Unspecified abdominal pain: Secondary | ICD-10-CM | POA: Diagnosis not present

## 2012-08-22 DIAGNOSIS — I2510 Atherosclerotic heart disease of native coronary artery without angina pectoris: Secondary | ICD-10-CM | POA: Diagnosis not present

## 2012-08-22 DIAGNOSIS — Z951 Presence of aortocoronary bypass graft: Secondary | ICD-10-CM | POA: Diagnosis not present

## 2012-08-22 DIAGNOSIS — Z79899 Other long term (current) drug therapy: Secondary | ICD-10-CM | POA: Diagnosis not present

## 2012-08-22 DIAGNOSIS — Z803 Family history of malignant neoplasm of breast: Secondary | ICD-10-CM | POA: Diagnosis not present

## 2012-08-22 DIAGNOSIS — N839 Noninflammatory disorder of ovary, fallopian tube and broad ligament, unspecified: Secondary | ICD-10-CM | POA: Diagnosis not present

## 2012-08-22 DIAGNOSIS — Z801 Family history of malignant neoplasm of trachea, bronchus and lung: Secondary | ICD-10-CM | POA: Diagnosis not present

## 2012-08-22 DIAGNOSIS — Z7982 Long term (current) use of aspirin: Secondary | ICD-10-CM | POA: Diagnosis not present

## 2012-08-22 DIAGNOSIS — Z90710 Acquired absence of both cervix and uterus: Secondary | ICD-10-CM | POA: Diagnosis not present

## 2012-08-22 DIAGNOSIS — Z5111 Encounter for antineoplastic chemotherapy: Secondary | ICD-10-CM | POA: Diagnosis not present

## 2012-08-22 DIAGNOSIS — R599 Enlarged lymph nodes, unspecified: Secondary | ICD-10-CM | POA: Diagnosis not present

## 2012-08-22 DIAGNOSIS — I10 Essential (primary) hypertension: Secondary | ICD-10-CM | POA: Diagnosis not present

## 2012-09-04 ENCOUNTER — Ambulatory Visit: Payer: Self-pay | Admitting: Oncology

## 2012-09-04 DIAGNOSIS — R599 Enlarged lymph nodes, unspecified: Secondary | ICD-10-CM | POA: Diagnosis not present

## 2012-09-04 DIAGNOSIS — C8299 Follicular lymphoma, unspecified, extranodal and solid organ sites: Secondary | ICD-10-CM | POA: Diagnosis not present

## 2012-09-04 DIAGNOSIS — C8589 Other specified types of non-Hodgkin lymphoma, extranodal and solid organ sites: Secondary | ICD-10-CM | POA: Diagnosis not present

## 2012-09-04 DIAGNOSIS — R109 Unspecified abdominal pain: Secondary | ICD-10-CM | POA: Diagnosis not present

## 2012-09-04 DIAGNOSIS — N839 Noninflammatory disorder of ovary, fallopian tube and broad ligament, unspecified: Secondary | ICD-10-CM | POA: Diagnosis not present

## 2012-09-04 LAB — COMPREHENSIVE METABOLIC PANEL
Albumin: 4 g/dL (ref 3.4–5.0)
Alkaline Phosphatase: 59 U/L (ref 50–136)
Anion Gap: 8 (ref 7–16)
Bilirubin,Total: 0.6 mg/dL (ref 0.2–1.0)
Calcium, Total: 10.6 mg/dL — ABNORMAL HIGH (ref 8.5–10.1)
EGFR (Non-African Amer.): 54 — ABNORMAL LOW
Glucose: 101 mg/dL — ABNORMAL HIGH (ref 65–99)
Osmolality: 285 (ref 275–301)
Potassium: 3.7 mmol/L (ref 3.5–5.1)
SGOT(AST): 25 U/L (ref 15–37)
SGPT (ALT): 28 U/L (ref 12–78)
Sodium: 139 mmol/L (ref 136–145)
Total Protein: 7.3 g/dL (ref 6.4–8.2)

## 2012-09-04 LAB — CBC CANCER CENTER
Basophil #: 0 x10 3/mm (ref 0.0–0.1)
Eosinophil #: 0.1 x10 3/mm (ref 0.0–0.7)
HCT: 41.9 % (ref 35.0–47.0)
HGB: 14.8 g/dL (ref 12.0–16.0)
Lymphocyte #: 0.9 x10 3/mm — ABNORMAL LOW (ref 1.0–3.6)
Monocyte %: 8.1 %
Neutrophil %: 66.6 %
RBC: 4.79 10*6/uL (ref 3.80–5.20)
WBC: 4 x10 3/mm (ref 3.6–11.0)

## 2012-09-22 ENCOUNTER — Ambulatory Visit: Payer: Self-pay | Admitting: Oncology

## 2012-10-21 DIAGNOSIS — R079 Chest pain, unspecified: Secondary | ICD-10-CM | POA: Diagnosis not present

## 2012-10-21 DIAGNOSIS — E785 Hyperlipidemia, unspecified: Secondary | ICD-10-CM | POA: Diagnosis not present

## 2012-10-21 DIAGNOSIS — I2510 Atherosclerotic heart disease of native coronary artery without angina pectoris: Secondary | ICD-10-CM | POA: Diagnosis not present

## 2012-10-21 DIAGNOSIS — I10 Essential (primary) hypertension: Secondary | ICD-10-CM | POA: Diagnosis not present

## 2012-11-03 DIAGNOSIS — R9430 Abnormal result of cardiovascular function study, unspecified: Secondary | ICD-10-CM | POA: Diagnosis not present

## 2012-11-03 DIAGNOSIS — I209 Angina pectoris, unspecified: Secondary | ICD-10-CM | POA: Diagnosis not present

## 2012-11-03 DIAGNOSIS — I2510 Atherosclerotic heart disease of native coronary artery without angina pectoris: Secondary | ICD-10-CM | POA: Diagnosis not present

## 2012-11-04 ENCOUNTER — Ambulatory Visit: Payer: Self-pay | Admitting: Oncology

## 2012-11-04 ENCOUNTER — Ambulatory Visit: Payer: Medicare Other | Admitting: General Surgery

## 2012-11-04 DIAGNOSIS — Z7982 Long term (current) use of aspirin: Secondary | ICD-10-CM | POA: Diagnosis not present

## 2012-11-04 DIAGNOSIS — Z79899 Other long term (current) drug therapy: Secondary | ICD-10-CM | POA: Diagnosis not present

## 2012-11-04 DIAGNOSIS — C8589 Other specified types of non-Hodgkin lymphoma, extranodal and solid organ sites: Secondary | ICD-10-CM | POA: Diagnosis not present

## 2012-11-04 DIAGNOSIS — Z5111 Encounter for antineoplastic chemotherapy: Secondary | ICD-10-CM | POA: Diagnosis not present

## 2012-11-04 DIAGNOSIS — I2510 Atherosclerotic heart disease of native coronary artery without angina pectoris: Secondary | ICD-10-CM | POA: Diagnosis not present

## 2012-11-04 DIAGNOSIS — I10 Essential (primary) hypertension: Secondary | ICD-10-CM | POA: Diagnosis not present

## 2012-11-04 DIAGNOSIS — Z23 Encounter for immunization: Secondary | ICD-10-CM | POA: Diagnosis not present

## 2012-11-04 LAB — CBC CANCER CENTER
Basophil %: 1.4 %
HCT: 40.5 % (ref 35.0–47.0)
HGB: 13.9 g/dL (ref 12.0–16.0)
Lymphocyte %: 22.5 %
MCH: 30.8 pg (ref 26.0–34.0)
MCHC: 34.3 g/dL (ref 32.0–36.0)
MCV: 90 fL (ref 80–100)
Monocyte %: 8.2 %
Neutrophil #: 2.8 x10 3/mm (ref 1.4–6.5)
Neutrophil %: 64.5 %
Platelet: 162 x10 3/mm (ref 150–440)
RDW: 13.7 % (ref 11.5–14.5)
WBC: 4.3 x10 3/mm (ref 3.6–11.0)

## 2012-11-04 LAB — COMPREHENSIVE METABOLIC PANEL
Albumin: 3.5 g/dL (ref 3.4–5.0)
Bilirubin,Total: 0.5 mg/dL (ref 0.2–1.0)
Creatinine: 1.16 mg/dL (ref 0.60–1.30)
EGFR (African American): 54 — ABNORMAL LOW
EGFR (Non-African Amer.): 46 — ABNORMAL LOW
Glucose: 130 mg/dL — ABNORMAL HIGH (ref 65–99)
Osmolality: 295 (ref 275–301)
Potassium: 4 mmol/L (ref 3.5–5.1)
SGPT (ALT): 35 U/L (ref 12–78)
Sodium: 143 mmol/L (ref 136–145)
Total Protein: 6.9 g/dL (ref 6.4–8.2)

## 2012-11-05 ENCOUNTER — Ambulatory Visit: Payer: Self-pay | Admitting: Internal Medicine

## 2012-11-05 DIAGNOSIS — R9430 Abnormal result of cardiovascular function study, unspecified: Secondary | ICD-10-CM | POA: Diagnosis not present

## 2012-11-05 DIAGNOSIS — E785 Hyperlipidemia, unspecified: Secondary | ICD-10-CM | POA: Diagnosis not present

## 2012-11-05 DIAGNOSIS — I2510 Atherosclerotic heart disease of native coronary artery without angina pectoris: Secondary | ICD-10-CM | POA: Diagnosis not present

## 2012-11-05 DIAGNOSIS — I498 Other specified cardiac arrhythmias: Secondary | ICD-10-CM | POA: Diagnosis not present

## 2012-11-05 DIAGNOSIS — I209 Angina pectoris, unspecified: Secondary | ICD-10-CM | POA: Diagnosis not present

## 2012-11-05 DIAGNOSIS — I10 Essential (primary) hypertension: Secondary | ICD-10-CM | POA: Diagnosis not present

## 2012-11-05 DIAGNOSIS — Z79899 Other long term (current) drug therapy: Secondary | ICD-10-CM | POA: Diagnosis not present

## 2012-11-05 DIAGNOSIS — Z7982 Long term (current) use of aspirin: Secondary | ICD-10-CM | POA: Diagnosis not present

## 2012-11-05 DIAGNOSIS — Z9861 Coronary angioplasty status: Secondary | ICD-10-CM | POA: Diagnosis not present

## 2012-11-05 DIAGNOSIS — Z8249 Family history of ischemic heart disease and other diseases of the circulatory system: Secondary | ICD-10-CM | POA: Diagnosis not present

## 2012-11-05 DIAGNOSIS — I25810 Atherosclerosis of coronary artery bypass graft(s) without angina pectoris: Secondary | ICD-10-CM | POA: Diagnosis not present

## 2012-11-18 ENCOUNTER — Encounter: Payer: Self-pay | Admitting: General Surgery

## 2012-11-18 ENCOUNTER — Ambulatory Visit: Payer: Self-pay | Admitting: General Surgery

## 2012-11-18 ENCOUNTER — Ambulatory Visit (INDEPENDENT_AMBULATORY_CARE_PROVIDER_SITE_OTHER): Payer: Medicare Other | Admitting: General Surgery

## 2012-11-18 VITALS — BP 110/70 | HR 62 | Resp 14 | Ht 65.0 in | Wt 154.0 lb

## 2012-11-18 DIAGNOSIS — I209 Angina pectoris, unspecified: Secondary | ICD-10-CM | POA: Diagnosis not present

## 2012-11-18 DIAGNOSIS — N63 Unspecified lump in unspecified breast: Secondary | ICD-10-CM | POA: Diagnosis not present

## 2012-11-18 DIAGNOSIS — I25810 Atherosclerosis of coronary artery bypass graft(s) without angina pectoris: Secondary | ICD-10-CM | POA: Diagnosis not present

## 2012-11-18 DIAGNOSIS — I119 Hypertensive heart disease without heart failure: Secondary | ICD-10-CM | POA: Diagnosis not present

## 2012-11-18 DIAGNOSIS — I059 Rheumatic mitral valve disease, unspecified: Secondary | ICD-10-CM | POA: Diagnosis not present

## 2012-11-18 NOTE — Patient Instructions (Signed)
Patient to return as needed. 

## 2012-11-18 NOTE — Progress Notes (Signed)
Patient ID: Jacqueline Webb, female   DOB: 03/02/1940, 72 y.o.   MRN: [id number]  Chief Complaint  Patient presents with  . Follow-up    6 month follow up right diagnostic mammogram     HPI Jacqueline Webb is a 72 y.o. female who presents for a breast evaluation. The most recent mammogram was done on 11/18/12. Patient does perform regular self breast checks and gets regular mammograms done. The patient denies any new problems with her breasts at this time. The patient underwent a right breast biopsy in April 2014 with findings of fibroadenomatous changes.   HPI  Past Medical History  Diagnosis Date  . Hypertension   . Hypercholesteremia   . Lymphoma 2007  . Benign neoplasm of rectum and anal canal   . Chronic leukemia of unspecified cell type, without mention of having achieved remission     Past Surgical History  Procedure Laterality Date  . Appendectomy    . Colonoscopy  2009, 2012     hyperplastic rectal polyp 2012 tubular adenoma of proximal ascending colon 2000 9 repeat exam due to 2017.  . Abdominal hysterectomy  1958  . Flexible sigmoidoscopy  1998  . Coronary artery bypass graft  1999  . Cardiac catheterization  2014  . Breast surgery Left 1990    biopsy  . Breast surgery Right May 08, 2012    complex fibroadenoma without malignancy.    History reviewed. No pertinent family history.  Social History History  Substance Use Topics  . Smoking status: Never Smoker   . Smokeless tobacco: Never Used  . Alcohol Use: Yes    No Known Allergies  Current Outpatient Prescriptions  Medication Sig Dispense Refill  . aspirin 81 MG tablet Take 81 mg by mouth daily.      . isosorbide mononitrate (IMDUR) 30 MG 24 hr tablet Take 1 tablet by mouth daily.      [name] levothyroxine (SYNTHROID, LEVOTHROID) 100 MCG tablet Take 88 mcg by mouth daily before breakfast.       . lisinopril-hydrochlorothiazide (PRINZIDE,ZESTORETIC) 20-12.5 MG per tablet Take 1 tablet by mouth daily.       . Melatonin 1 MG CAPS Take 1 capsule by mouth at bedtime.      [name] RASPBERRY KETONES PO Take 100 mg by mouth daily.      . simvastatin (ZOCOR) 40 MG tablet Take 40 mg by mouth every evening.       No current facility-administered medications for this visit.    Review of Systems Review of Systems  Constitutional: Negative.   Respiratory: Negative.   Cardiovascular: Negative.     Blood pressure 110/70, pulse 62, resp. rate 14, height 5\' 5"  (1.651 m), weight 154 lb (69.854 kg).  Physical Exam Physical Exam  Constitutional: She is oriented to person, place, and time. She appears well-developed and well-nourished.  Neck: No thyromegaly present.  Cardiovascular: Normal rate, regular rhythm and normal heart sounds.   No murmur heard. Pulmonary/Chest: Effort normal and breath sounds normal. Right breast exhibits no inverted nipple, no mass, no nipple discharge, no skin change and no tenderness. Left breast exhibits no inverted nipple, no mass, no nipple discharge, no skin change and no tenderness.  Lymphadenopathy:    She has no cervical adenopathy.    She has no axillary adenopathy.  Neurological: She is alert and oriented to person, place, and time.  Skin: Skin is warm and dry.     Left shoulder recurrent lipoma.     Data  Reviewed Right breast mammogram dated today showed a biopsy clip present. No areas of concern. BI-RAD-1.  Assessment    Benign breast exam.     Plan    The patient will resume screening mammograms with Julieanne Manson, M.D. In spring 2015. She'll be contacted in 2017 regarding a followup colonoscopy.         [name] 11/18/2012, 7:42 PM

## 2012-11-22 ENCOUNTER — Ambulatory Visit: Payer: Self-pay | Admitting: Oncology

## 2012-11-27 ENCOUNTER — Encounter: Payer: Self-pay | Admitting: General Surgery

## 2012-12-16 DIAGNOSIS — I10 Essential (primary) hypertension: Secondary | ICD-10-CM | POA: Diagnosis not present

## 2012-12-16 DIAGNOSIS — I2510 Atherosclerotic heart disease of native coronary artery without angina pectoris: Secondary | ICD-10-CM | POA: Diagnosis not present

## 2012-12-16 DIAGNOSIS — Z1339 Encounter for screening examination for other mental health and behavioral disorders: Secondary | ICD-10-CM | POA: Diagnosis not present

## 2012-12-16 DIAGNOSIS — Z1331 Encounter for screening for depression: Secondary | ICD-10-CM | POA: Diagnosis not present

## 2012-12-16 DIAGNOSIS — Z0000 Encounter for general adult medical examination without abnormal findings: Secondary | ICD-10-CM | POA: Diagnosis not present

## 2012-12-16 DIAGNOSIS — E7800 Pure hypercholesterolemia, unspecified: Secondary | ICD-10-CM | POA: Diagnosis not present

## 2012-12-30 ENCOUNTER — Ambulatory Visit: Payer: Self-pay | Admitting: Oncology

## 2012-12-30 DIAGNOSIS — Z951 Presence of aortocoronary bypass graft: Secondary | ICD-10-CM | POA: Diagnosis not present

## 2012-12-30 DIAGNOSIS — I209 Angina pectoris, unspecified: Secondary | ICD-10-CM | POA: Diagnosis not present

## 2012-12-30 DIAGNOSIS — I119 Hypertensive heart disease without heart failure: Secondary | ICD-10-CM | POA: Diagnosis not present

## 2012-12-30 DIAGNOSIS — N839 Noninflammatory disorder of ovary, fallopian tube and broad ligament, unspecified: Secondary | ICD-10-CM | POA: Diagnosis not present

## 2012-12-30 DIAGNOSIS — Z9089 Acquired absence of other organs: Secondary | ICD-10-CM | POA: Diagnosis not present

## 2012-12-30 DIAGNOSIS — R109 Unspecified abdominal pain: Secondary | ICD-10-CM | POA: Diagnosis not present

## 2012-12-30 DIAGNOSIS — Z5111 Encounter for antineoplastic chemotherapy: Secondary | ICD-10-CM | POA: Diagnosis not present

## 2012-12-30 DIAGNOSIS — R5381 Other malaise: Secondary | ICD-10-CM | POA: Diagnosis not present

## 2012-12-30 DIAGNOSIS — I2510 Atherosclerotic heart disease of native coronary artery without angina pectoris: Secondary | ICD-10-CM | POA: Diagnosis not present

## 2012-12-30 DIAGNOSIS — Z79899 Other long term (current) drug therapy: Secondary | ICD-10-CM | POA: Diagnosis not present

## 2012-12-30 DIAGNOSIS — Z7982 Long term (current) use of aspirin: Secondary | ICD-10-CM | POA: Diagnosis not present

## 2012-12-30 DIAGNOSIS — I10 Essential (primary) hypertension: Secondary | ICD-10-CM | POA: Diagnosis not present

## 2012-12-30 DIAGNOSIS — I25810 Atherosclerosis of coronary artery bypass graft(s) without angina pectoris: Secondary | ICD-10-CM | POA: Diagnosis not present

## 2012-12-30 DIAGNOSIS — C8299 Follicular lymphoma, unspecified, extranodal and solid organ sites: Secondary | ICD-10-CM | POA: Diagnosis not present

## 2012-12-30 DIAGNOSIS — I059 Rheumatic mitral valve disease, unspecified: Secondary | ICD-10-CM | POA: Diagnosis not present

## 2012-12-30 DIAGNOSIS — Z90710 Acquired absence of both cervix and uterus: Secondary | ICD-10-CM | POA: Diagnosis not present

## 2012-12-30 LAB — CBC CANCER CENTER
Basophil #: 0 x10 3/mm (ref 0.0–0.1)
Eosinophil #: 0.1 x10 3/mm (ref 0.0–0.7)
Eosinophil %: 2.1 %
HCT: 42 % (ref 35.0–47.0)
HGB: 14.2 g/dL (ref 12.0–16.0)
Lymphocyte %: 22.6 %
MCHC: 33.7 g/dL (ref 32.0–36.0)
Platelet: 153 x10 3/mm (ref 150–440)

## 2012-12-30 LAB — COMPREHENSIVE METABOLIC PANEL
Albumin: 3.8 g/dL (ref 3.4–5.0)
Alkaline Phosphatase: 47 U/L
Anion Gap: 7 (ref 7–16)
Bilirubin,Total: 0.6 mg/dL (ref 0.2–1.0)
Calcium, Total: 10 mg/dL (ref 8.5–10.1)
Chloride: 102 mmol/L (ref 98–107)
Creatinine: 1.02 mg/dL (ref 0.60–1.30)
EGFR (Non-African Amer.): 54 — ABNORMAL LOW
Glucose: 110 mg/dL — ABNORMAL HIGH (ref 65–99)
Osmolality: 284 (ref 275–301)
Potassium: 3.7 mmol/L (ref 3.5–5.1)
SGOT(AST): 28 U/L (ref 15–37)

## 2013-01-22 ENCOUNTER — Ambulatory Visit: Payer: Self-pay | Admitting: Oncology

## 2013-03-02 ENCOUNTER — Ambulatory Visit: Payer: Self-pay | Admitting: Oncology

## 2013-03-02 DIAGNOSIS — Z7982 Long term (current) use of aspirin: Secondary | ICD-10-CM | POA: Diagnosis not present

## 2013-03-02 DIAGNOSIS — Z803 Family history of malignant neoplasm of breast: Secondary | ICD-10-CM | POA: Diagnosis not present

## 2013-03-02 DIAGNOSIS — Z801 Family history of malignant neoplasm of trachea, bronchus and lung: Secondary | ICD-10-CM | POA: Diagnosis not present

## 2013-03-02 DIAGNOSIS — C8295 Follicular lymphoma, unspecified, lymph nodes of inguinal region and lower limb: Secondary | ICD-10-CM | POA: Diagnosis not present

## 2013-03-02 DIAGNOSIS — Z8249 Family history of ischemic heart disease and other diseases of the circulatory system: Secondary | ICD-10-CM | POA: Diagnosis not present

## 2013-03-02 DIAGNOSIS — Z79899 Other long term (current) drug therapy: Secondary | ICD-10-CM | POA: Diagnosis not present

## 2013-03-02 DIAGNOSIS — I10 Essential (primary) hypertension: Secondary | ICD-10-CM | POA: Diagnosis not present

## 2013-03-02 DIAGNOSIS — Z5111 Encounter for antineoplastic chemotherapy: Secondary | ICD-10-CM | POA: Diagnosis not present

## 2013-03-02 DIAGNOSIS — Z951 Presence of aortocoronary bypass graft: Secondary | ICD-10-CM | POA: Diagnosis not present

## 2013-03-02 DIAGNOSIS — I2510 Atherosclerotic heart disease of native coronary artery without angina pectoris: Secondary | ICD-10-CM | POA: Diagnosis not present

## 2013-03-03 DIAGNOSIS — C8295 Follicular lymphoma, unspecified, lymph nodes of inguinal region and lower limb: Secondary | ICD-10-CM | POA: Diagnosis not present

## 2013-03-03 DIAGNOSIS — Z5111 Encounter for antineoplastic chemotherapy: Secondary | ICD-10-CM | POA: Diagnosis not present

## 2013-03-03 DIAGNOSIS — I2510 Atherosclerotic heart disease of native coronary artery without angina pectoris: Secondary | ICD-10-CM | POA: Diagnosis not present

## 2013-03-03 DIAGNOSIS — I10 Essential (primary) hypertension: Secondary | ICD-10-CM | POA: Diagnosis not present

## 2013-03-03 DIAGNOSIS — Z7982 Long term (current) use of aspirin: Secondary | ICD-10-CM | POA: Diagnosis not present

## 2013-03-03 LAB — CBC CANCER CENTER
BASOS PCT: 1.4 %
Basophil #: 0.1 x10 3/mm (ref 0.0–0.1)
EOS PCT: 2.4 %
Eosinophil #: 0.1 x10 3/mm (ref 0.0–0.7)
HCT: 41.6 % (ref 35.0–47.0)
HGB: 13.9 g/dL (ref 12.0–16.0)
LYMPHS PCT: 23.2 %
Lymphocyte #: 1.1 x10 3/mm (ref 1.0–3.6)
MCH: 29.7 pg (ref 26.0–34.0)
MCHC: 33.3 g/dL (ref 32.0–36.0)
MCV: 89 fL (ref 80–100)
Monocyte #: 0.4 x10 3/mm (ref 0.2–0.9)
Monocyte %: 9.5 %
NEUTROS ABS: 2.9 x10 3/mm (ref 1.4–6.5)
Neutrophil %: 63.5 %
PLATELETS: 158 x10 3/mm (ref 150–440)
RBC: 4.66 10*6/uL (ref 3.80–5.20)
RDW: 13.7 % (ref 11.5–14.5)
WBC: 4.6 x10 3/mm (ref 3.6–11.0)

## 2013-03-03 LAB — COMPREHENSIVE METABOLIC PANEL
ALK PHOS: 48 U/L
ALT: 30 U/L (ref 12–78)
ANION GAP: 8 (ref 7–16)
Albumin: 3.8 g/dL (ref 3.4–5.0)
BILIRUBIN TOTAL: 0.6 mg/dL (ref 0.2–1.0)
BUN: 24 mg/dL — ABNORMAL HIGH (ref 7–18)
CALCIUM: 9.4 mg/dL (ref 8.5–10.1)
CREATININE: 1.1 mg/dL (ref 0.60–1.30)
Chloride: 102 mmol/L (ref 98–107)
Co2: 30 mmol/L (ref 21–32)
EGFR (African American): 57 — ABNORMAL LOW
GFR CALC NON AF AMER: 49 — AB
Glucose: 120 mg/dL — ABNORMAL HIGH (ref 65–99)
Osmolality: 285 (ref 275–301)
Potassium: 3.7 mmol/L (ref 3.5–5.1)
SGOT(AST): 29 U/L (ref 15–37)
Sodium: 140 mmol/L (ref 136–145)
Total Protein: 6.9 g/dL (ref 6.4–8.2)

## 2013-03-03 LAB — MAGNESIUM: Magnesium: 1.3 mg/dL — ABNORMAL LOW

## 2013-03-03 LAB — LACTATE DEHYDROGENASE: LDH: 168 U/L (ref 81–246)

## 2013-03-22 ENCOUNTER — Ambulatory Visit: Payer: Self-pay | Admitting: Oncology

## 2013-04-09 ENCOUNTER — Ambulatory Visit: Payer: Self-pay | Admitting: Family Medicine

## 2013-04-09 DIAGNOSIS — C8589 Other specified types of non-Hodgkin lymphoma, extranodal and solid organ sites: Secondary | ICD-10-CM | POA: Diagnosis not present

## 2013-04-09 DIAGNOSIS — C8295 Follicular lymphoma, unspecified, lymph nodes of inguinal region and lower limb: Secondary | ICD-10-CM | POA: Diagnosis not present

## 2013-04-09 DIAGNOSIS — R948 Abnormal results of function studies of other organs and systems: Secondary | ICD-10-CM | POA: Diagnosis not present

## 2013-04-09 DIAGNOSIS — R946 Abnormal results of thyroid function studies: Secondary | ICD-10-CM | POA: Diagnosis not present

## 2013-04-22 ENCOUNTER — Ambulatory Visit: Payer: Self-pay | Admitting: Oncology

## 2013-04-22 DIAGNOSIS — Z7982 Long term (current) use of aspirin: Secondary | ICD-10-CM | POA: Diagnosis not present

## 2013-04-22 DIAGNOSIS — Z8249 Family history of ischemic heart disease and other diseases of the circulatory system: Secondary | ICD-10-CM | POA: Diagnosis not present

## 2013-04-22 DIAGNOSIS — I10 Essential (primary) hypertension: Secondary | ICD-10-CM | POA: Diagnosis not present

## 2013-04-22 DIAGNOSIS — C8295 Follicular lymphoma, unspecified, lymph nodes of inguinal region and lower limb: Secondary | ICD-10-CM | POA: Diagnosis not present

## 2013-04-22 DIAGNOSIS — Z951 Presence of aortocoronary bypass graft: Secondary | ICD-10-CM | POA: Diagnosis not present

## 2013-04-22 DIAGNOSIS — Z803 Family history of malignant neoplasm of breast: Secondary | ICD-10-CM | POA: Diagnosis not present

## 2013-04-22 DIAGNOSIS — Z801 Family history of malignant neoplasm of trachea, bronchus and lung: Secondary | ICD-10-CM | POA: Diagnosis not present

## 2013-05-05 DIAGNOSIS — I059 Rheumatic mitral valve disease, unspecified: Secondary | ICD-10-CM | POA: Diagnosis not present

## 2013-05-05 DIAGNOSIS — E7800 Pure hypercholesterolemia, unspecified: Secondary | ICD-10-CM | POA: Diagnosis not present

## 2013-05-05 DIAGNOSIS — I119 Hypertensive heart disease without heart failure: Secondary | ICD-10-CM | POA: Diagnosis not present

## 2013-05-05 DIAGNOSIS — I2510 Atherosclerotic heart disease of native coronary artery without angina pectoris: Secondary | ICD-10-CM | POA: Diagnosis not present

## 2013-05-19 DIAGNOSIS — E041 Nontoxic single thyroid nodule: Secondary | ICD-10-CM | POA: Diagnosis not present

## 2013-05-19 DIAGNOSIS — I2510 Atherosclerotic heart disease of native coronary artery without angina pectoris: Secondary | ICD-10-CM | POA: Diagnosis not present

## 2013-05-19 DIAGNOSIS — N839 Noninflammatory disorder of ovary, fallopian tube and broad ligament, unspecified: Secondary | ICD-10-CM | POA: Diagnosis not present

## 2013-05-19 DIAGNOSIS — C8295 Follicular lymphoma, unspecified, lymph nodes of inguinal region and lower limb: Secondary | ICD-10-CM | POA: Diagnosis not present

## 2013-05-19 DIAGNOSIS — I10 Essential (primary) hypertension: Secondary | ICD-10-CM | POA: Diagnosis not present

## 2013-05-19 DIAGNOSIS — E039 Hypothyroidism, unspecified: Secondary | ICD-10-CM | POA: Diagnosis not present

## 2013-05-19 DIAGNOSIS — E785 Hyperlipidemia, unspecified: Secondary | ICD-10-CM | POA: Diagnosis not present

## 2013-05-19 DIAGNOSIS — N949 Unspecified condition associated with female genital organs and menstrual cycle: Secondary | ICD-10-CM | POA: Diagnosis not present

## 2013-05-22 ENCOUNTER — Ambulatory Visit: Payer: Self-pay | Admitting: Oncology

## 2013-05-25 DIAGNOSIS — E7800 Pure hypercholesterolemia, unspecified: Secondary | ICD-10-CM | POA: Diagnosis not present

## 2013-05-25 DIAGNOSIS — E039 Hypothyroidism, unspecified: Secondary | ICD-10-CM | POA: Diagnosis not present

## 2013-05-25 DIAGNOSIS — I10 Essential (primary) hypertension: Secondary | ICD-10-CM | POA: Diagnosis not present

## 2013-05-25 LAB — HEPATIC FUNCTION PANEL
ALK PHOS: 46 U/L (ref 25–125)
ALT: 23 U/L (ref 7–35)
AST: 32 U/L (ref 13–35)
Bilirubin, Total: 0.6 mg/dL

## 2013-05-25 LAB — CBC AND DIFFERENTIAL
HCT: 39 % (ref 36–46)
Hemoglobin: 12.9 g/dL (ref 12.0–16.0)
NEUTROS ABS: 60 /uL
PLATELETS: 175 10*3/uL (ref 150–399)
WBC: 3.1 10*3/mL

## 2013-05-25 LAB — BASIC METABOLIC PANEL
BUN: 27 mg/dL — AB (ref 4–21)
Creatinine: 1.1 mg/dL (ref <=1.1)
Glucose: 98 mg/dL
Potassium: 4.4 mmol/L (ref 3.4–5.3)
Sodium: 142 mmol/L (ref 137–147)

## 2013-05-25 LAB — TSH: TSH: 1.81 u[IU]/mL (ref <=5.90)

## 2013-07-09 ENCOUNTER — Ambulatory Visit: Payer: Self-pay | Admitting: Oncology

## 2013-07-09 DIAGNOSIS — R599 Enlarged lymph nodes, unspecified: Secondary | ICD-10-CM | POA: Diagnosis not present

## 2013-07-09 DIAGNOSIS — Z9089 Acquired absence of other organs: Secondary | ICD-10-CM | POA: Diagnosis not present

## 2013-07-09 DIAGNOSIS — F411 Generalized anxiety disorder: Secondary | ICD-10-CM | POA: Diagnosis not present

## 2013-07-09 DIAGNOSIS — Z803 Family history of malignant neoplasm of breast: Secondary | ICD-10-CM | POA: Diagnosis not present

## 2013-07-09 DIAGNOSIS — Z90710 Acquired absence of both cervix and uterus: Secondary | ICD-10-CM | POA: Diagnosis not present

## 2013-07-09 DIAGNOSIS — Z79899 Other long term (current) drug therapy: Secondary | ICD-10-CM | POA: Diagnosis not present

## 2013-07-09 DIAGNOSIS — Z8249 Family history of ischemic heart disease and other diseases of the circulatory system: Secondary | ICD-10-CM | POA: Diagnosis not present

## 2013-07-09 DIAGNOSIS — I2510 Atherosclerotic heart disease of native coronary artery without angina pectoris: Secondary | ICD-10-CM | POA: Diagnosis not present

## 2013-07-09 DIAGNOSIS — R1032 Left lower quadrant pain: Secondary | ICD-10-CM | POA: Diagnosis not present

## 2013-07-09 DIAGNOSIS — Z7982 Long term (current) use of aspirin: Secondary | ICD-10-CM | POA: Diagnosis not present

## 2013-07-09 DIAGNOSIS — Z9221 Personal history of antineoplastic chemotherapy: Secondary | ICD-10-CM | POA: Diagnosis not present

## 2013-07-09 DIAGNOSIS — Z801 Family history of malignant neoplasm of trachea, bronchus and lung: Secondary | ICD-10-CM | POA: Diagnosis not present

## 2013-07-09 DIAGNOSIS — N9489 Other specified conditions associated with female genital organs and menstrual cycle: Secondary | ICD-10-CM | POA: Diagnosis not present

## 2013-07-09 DIAGNOSIS — C8589 Other specified types of non-Hodgkin lymphoma, extranodal and solid organ sites: Secondary | ICD-10-CM | POA: Diagnosis not present

## 2013-07-09 DIAGNOSIS — I10 Essential (primary) hypertension: Secondary | ICD-10-CM | POA: Diagnosis not present

## 2013-07-09 LAB — COMPREHENSIVE METABOLIC PANEL
ALT: 31 U/L (ref 12–78)
Albumin: 4 g/dL (ref 3.4–5.0)
Alkaline Phosphatase: 53 U/L
Anion Gap: 8 (ref 7–16)
BUN: 21 mg/dL — ABNORMAL HIGH (ref 7–18)
Bilirubin,Total: 0.6 mg/dL (ref 0.2–1.0)
Calcium, Total: 10 mg/dL (ref 8.5–10.1)
Chloride: 104 mmol/L (ref 98–107)
Co2: 30 mmol/L (ref 21–32)
Creatinine: 1.04 mg/dL (ref 0.60–1.30)
GFR CALC NON AF AMER: 53 — AB
GLUCOSE: 101 mg/dL — AB (ref 65–99)
Osmolality: 286 (ref 275–301)
Potassium: 3.7 mmol/L (ref 3.5–5.1)
SGOT(AST): 27 U/L (ref 15–37)
Sodium: 142 mmol/L (ref 136–145)
TOTAL PROTEIN: 7.3 g/dL (ref 6.4–8.2)

## 2013-07-09 LAB — CBC CANCER CENTER
BASOS ABS: 0 x10 3/mm (ref 0.0–0.1)
BASOS PCT: 0.9 %
EOS PCT: 0.8 %
Eosinophil #: 0 x10 3/mm (ref 0.0–0.7)
HCT: 40.7 % (ref 35.0–47.0)
HGB: 13.8 g/dL (ref 12.0–16.0)
Lymphocyte #: 1.1 x10 3/mm (ref 1.0–3.6)
Lymphocyte %: 22.2 %
MCH: 30.2 pg (ref 26.0–34.0)
MCHC: 33.8 g/dL (ref 32.0–36.0)
MCV: 89 fL (ref 80–100)
MONO ABS: 0.4 x10 3/mm (ref 0.2–0.9)
Monocyte %: 8.5 %
NEUTROS PCT: 67.6 %
Neutrophil #: 3.2 x10 3/mm (ref 1.4–6.5)
Platelet: 145 x10 3/mm — ABNORMAL LOW (ref 150–440)
RBC: 4.56 10*6/uL (ref 3.80–5.20)
RDW: 13.7 % (ref 11.5–14.5)
WBC: 4.7 x10 3/mm (ref 3.6–11.0)

## 2013-07-09 LAB — LACTATE DEHYDROGENASE: LDH: 180 U/L (ref 81–246)

## 2013-07-22 ENCOUNTER — Ambulatory Visit: Payer: Self-pay | Admitting: Oncology

## 2013-11-24 DIAGNOSIS — I25810 Atherosclerosis of coronary artery bypass graft(s) without angina pectoris: Secondary | ICD-10-CM | POA: Insufficient documentation

## 2013-11-25 ENCOUNTER — Ambulatory Visit: Payer: Self-pay | Admitting: Family Medicine

## 2013-11-25 DIAGNOSIS — Z1231 Encounter for screening mammogram for malignant neoplasm of breast: Secondary | ICD-10-CM | POA: Diagnosis not present

## 2013-11-25 DIAGNOSIS — E782 Mixed hyperlipidemia: Secondary | ICD-10-CM | POA: Diagnosis not present

## 2013-11-25 DIAGNOSIS — I10 Essential (primary) hypertension: Secondary | ICD-10-CM | POA: Diagnosis not present

## 2013-11-25 DIAGNOSIS — I25810 Atherosclerosis of coronary artery bypass graft(s) without angina pectoris: Secondary | ICD-10-CM | POA: Diagnosis not present

## 2013-11-25 LAB — HM MAMMOGRAPHY: HM Mammogram: NEGATIVE

## 2013-12-24 DIAGNOSIS — Z1389 Encounter for screening for other disorder: Secondary | ICD-10-CM | POA: Diagnosis not present

## 2013-12-24 DIAGNOSIS — Z0000 Encounter for general adult medical examination without abnormal findings: Secondary | ICD-10-CM | POA: Diagnosis not present

## 2013-12-31 ENCOUNTER — Ambulatory Visit: Payer: Self-pay | Admitting: Oncology

## 2013-12-31 DIAGNOSIS — Z9049 Acquired absence of other specified parts of digestive tract: Secondary | ICD-10-CM | POA: Diagnosis not present

## 2013-12-31 DIAGNOSIS — Z7982 Long term (current) use of aspirin: Secondary | ICD-10-CM | POA: Diagnosis not present

## 2013-12-31 DIAGNOSIS — F419 Anxiety disorder, unspecified: Secondary | ICD-10-CM | POA: Diagnosis not present

## 2013-12-31 DIAGNOSIS — Z951 Presence of aortocoronary bypass graft: Secondary | ICD-10-CM | POA: Diagnosis not present

## 2013-12-31 DIAGNOSIS — C8590 Non-Hodgkin lymphoma, unspecified, unspecified site: Secondary | ICD-10-CM | POA: Diagnosis not present

## 2013-12-31 DIAGNOSIS — R1032 Left lower quadrant pain: Secondary | ICD-10-CM | POA: Diagnosis not present

## 2013-12-31 DIAGNOSIS — I2510 Atherosclerotic heart disease of native coronary artery without angina pectoris: Secondary | ICD-10-CM | POA: Diagnosis not present

## 2013-12-31 DIAGNOSIS — Z79899 Other long term (current) drug therapy: Secondary | ICD-10-CM | POA: Diagnosis not present

## 2013-12-31 DIAGNOSIS — Z803 Family history of malignant neoplasm of breast: Secondary | ICD-10-CM | POA: Diagnosis not present

## 2013-12-31 DIAGNOSIS — I10 Essential (primary) hypertension: Secondary | ICD-10-CM | POA: Diagnosis not present

## 2013-12-31 DIAGNOSIS — Z23 Encounter for immunization: Secondary | ICD-10-CM | POA: Diagnosis not present

## 2013-12-31 DIAGNOSIS — Z801 Family history of malignant neoplasm of trachea, bronchus and lung: Secondary | ICD-10-CM | POA: Diagnosis not present

## 2013-12-31 DIAGNOSIS — Z90710 Acquired absence of both cervix and uterus: Secondary | ICD-10-CM | POA: Diagnosis not present

## 2013-12-31 DIAGNOSIS — Z0000 Encounter for general adult medical examination without abnormal findings: Secondary | ICD-10-CM | POA: Diagnosis not present

## 2013-12-31 DIAGNOSIS — Z1389 Encounter for screening for other disorder: Secondary | ICD-10-CM | POA: Diagnosis not present

## 2013-12-31 LAB — CBC CANCER CENTER
Basophil #: 0 x10 3/mm (ref 0.0–0.1)
Basophil %: 1.2 %
EOS PCT: 1.3 %
Eosinophil #: 0.1 x10 3/mm (ref 0.0–0.7)
HCT: 41.5 % (ref 35.0–47.0)
HGB: 13.8 g/dL (ref 12.0–16.0)
LYMPHS PCT: 27.4 %
Lymphocyte #: 1.1 x10 3/mm (ref 1.0–3.6)
MCH: 29.5 pg (ref 26.0–34.0)
MCHC: 33.2 g/dL (ref 32.0–36.0)
MCV: 89 fL (ref 80–100)
Monocyte #: 0.4 x10 3/mm (ref 0.2–0.9)
Monocyte %: 10.8 %
NEUTROS ABS: 2.4 x10 3/mm (ref 1.4–6.5)
Neutrophil %: 59.3 %
Platelet: 148 x10 3/mm — ABNORMAL LOW (ref 150–440)
RBC: 4.67 10*6/uL (ref 3.80–5.20)
RDW: 14 % (ref 11.5–14.5)
WBC: 4 x10 3/mm (ref 3.6–11.0)

## 2013-12-31 LAB — COMPREHENSIVE METABOLIC PANEL WITH GFR
Albumin: 3.8 g/dL (ref 3.4–5.0)
Alkaline Phosphatase: 58 U/L
Anion Gap: 9 (ref 7–16)
BUN: 24 mg/dL — ABNORMAL HIGH (ref 7–18)
Bilirubin,Total: 0.8 mg/dL (ref 0.2–1.0)
Calcium, Total: 10 mg/dL (ref 8.5–10.1)
Chloride: 103 mmol/L (ref 98–107)
Co2: 29 mmol/L (ref 21–32)
Creatinine: 0.99 mg/dL (ref 0.60–1.30)
EGFR (African American): >60
EGFR (Non-African Amer.): 58 — ABNORMAL LOW
Glucose: 124 mg/dL — ABNORMAL HIGH (ref 65–99)
Osmolality: 287 (ref 275–301)
Potassium: 3.9 mmol/L (ref 3.5–5.1)
SGOT(AST): 22 U/L (ref 15–37)
SGPT (ALT): 27 U/L
Sodium: 141 mmol/L (ref 136–145)
Total Protein: 6.9 g/dL (ref 6.4–8.2)

## 2013-12-31 LAB — LACTATE DEHYDROGENASE: LDH: 166 U/L (ref 81–246)

## 2014-01-19 DIAGNOSIS — E780 Pure hypercholesterolemia: Secondary | ICD-10-CM | POA: Diagnosis not present

## 2014-01-20 LAB — LIPID PANEL
Cholesterol: 214 mg/dL — AB (ref 0–200)
HDL: 73 mg/dL — AB (ref 35–70)
LDL Cholesterol: 127 mg/dL
LDL/HDL RATIO: 1.7
Triglycerides: 68 mg/dL (ref 40–160)

## 2014-01-22 ENCOUNTER — Ambulatory Visit: Payer: Self-pay | Admitting: Oncology

## 2014-03-17 DIAGNOSIS — H2510 Age-related nuclear cataract, unspecified eye: Secondary | ICD-10-CM | POA: Diagnosis not present

## 2014-04-05 ENCOUNTER — Ambulatory Visit: Payer: Self-pay | Admitting: Oncology

## 2014-04-05 DIAGNOSIS — I2510 Atherosclerotic heart disease of native coronary artery without angina pectoris: Secondary | ICD-10-CM | POA: Diagnosis not present

## 2014-04-05 DIAGNOSIS — R161 Splenomegaly, not elsewhere classified: Secondary | ICD-10-CM | POA: Diagnosis not present

## 2014-04-05 DIAGNOSIS — C8590 Non-Hodgkin lymphoma, unspecified, unspecified site: Secondary | ICD-10-CM | POA: Diagnosis not present

## 2014-04-05 DIAGNOSIS — R590 Localized enlarged lymph nodes: Secondary | ICD-10-CM | POA: Diagnosis not present

## 2014-04-05 DIAGNOSIS — R399 Unspecified symptoms and signs involving the genitourinary system: Secondary | ICD-10-CM | POA: Diagnosis not present

## 2014-04-05 DIAGNOSIS — Z23 Encounter for immunization: Secondary | ICD-10-CM | POA: Diagnosis not present

## 2014-04-05 DIAGNOSIS — C9110 Chronic lymphocytic leukemia of B-cell type not having achieved remission: Secondary | ICD-10-CM | POA: Diagnosis not present

## 2014-04-07 ENCOUNTER — Ambulatory Visit
Admit: 2014-04-07 | Disposition: A | Discharge status: Home / Self Care | Payer: Self-pay | Attending: Oncology | Admitting: Oncology

## 2014-04-07 DIAGNOSIS — Z801 Family history of malignant neoplasm of trachea, bronchus and lung: Secondary | ICD-10-CM | POA: Diagnosis not present

## 2014-04-07 DIAGNOSIS — C8590 Non-Hodgkin lymphoma, unspecified, unspecified site: Secondary | ICD-10-CM | POA: Diagnosis not present

## 2014-04-07 DIAGNOSIS — I2510 Atherosclerotic heart disease of native coronary artery without angina pectoris: Secondary | ICD-10-CM | POA: Diagnosis not present

## 2014-04-07 DIAGNOSIS — Z90710 Acquired absence of both cervix and uterus: Secondary | ICD-10-CM | POA: Diagnosis not present

## 2014-04-07 DIAGNOSIS — I10 Essential (primary) hypertension: Secondary | ICD-10-CM | POA: Diagnosis not present

## 2014-04-07 DIAGNOSIS — Z9049 Acquired absence of other specified parts of digestive tract: Secondary | ICD-10-CM | POA: Diagnosis not present

## 2014-04-07 DIAGNOSIS — Z951 Presence of aortocoronary bypass graft: Secondary | ICD-10-CM | POA: Diagnosis not present

## 2014-04-07 DIAGNOSIS — F419 Anxiety disorder, unspecified: Secondary | ICD-10-CM | POA: Diagnosis not present

## 2014-04-07 DIAGNOSIS — Z803 Family history of malignant neoplasm of breast: Secondary | ICD-10-CM | POA: Diagnosis not present

## 2014-04-07 DIAGNOSIS — Z9221 Personal history of antineoplastic chemotherapy: Secondary | ICD-10-CM | POA: Diagnosis not present

## 2014-04-07 DIAGNOSIS — Z79899 Other long term (current) drug therapy: Secondary | ICD-10-CM | POA: Diagnosis not present

## 2014-04-07 DIAGNOSIS — Z7982 Long term (current) use of aspirin: Secondary | ICD-10-CM | POA: Diagnosis not present

## 2014-04-07 DIAGNOSIS — N3281 Overactive bladder: Secondary | ICD-10-CM | POA: Diagnosis not present

## 2014-04-09 DIAGNOSIS — H25011 Cortical age-related cataract, right eye: Secondary | ICD-10-CM | POA: Diagnosis not present

## 2014-04-09 DIAGNOSIS — H2511 Age-related nuclear cataract, right eye: Secondary | ICD-10-CM | POA: Diagnosis not present

## 2014-04-09 DIAGNOSIS — H25012 Cortical age-related cataract, left eye: Secondary | ICD-10-CM | POA: Diagnosis not present

## 2014-04-09 DIAGNOSIS — H40013 Open angle with borderline findings, low risk, bilateral: Secondary | ICD-10-CM | POA: Diagnosis not present

## 2014-04-09 DIAGNOSIS — H524 Presbyopia: Secondary | ICD-10-CM | POA: Diagnosis not present

## 2014-04-13 ENCOUNTER — Encounter: Payer: Self-pay | Admitting: General Surgery

## 2014-04-13 ENCOUNTER — Other Ambulatory Visit: Payer: PRIVATE HEALTH INSURANCE

## 2014-04-13 ENCOUNTER — Ambulatory Visit (INDEPENDENT_AMBULATORY_CARE_PROVIDER_SITE_OTHER): Payer: Medicare Other | Admitting: General Surgery

## 2014-04-13 VITALS — BP 132/82 | HR 76 | Resp 12 | Ht 65.0 in | Wt 158.0 lb

## 2014-04-13 DIAGNOSIS — R599 Enlarged lymph nodes, unspecified: Secondary | ICD-10-CM

## 2014-04-13 DIAGNOSIS — C8590 Non-Hodgkin lymphoma, unspecified, unspecified site: Secondary | ICD-10-CM | POA: Diagnosis not present

## 2014-04-13 DIAGNOSIS — R591 Generalized enlarged lymph nodes: Secondary | ICD-10-CM | POA: Diagnosis not present

## 2014-04-13 DIAGNOSIS — C8304 Small cell B-cell lymphoma, lymph nodes of axilla and upper limb: Secondary | ICD-10-CM | POA: Diagnosis not present

## 2014-04-13 NOTE — Progress Notes (Signed)
Patient ID: GLAYDS INSCO, female   DOB: 06-09-1940, 74 y.o.   MRN: [id number]  Chief Complaint  Patient presents with  . Follow-up    left inguinal hernia    HPI Jacqueline Webb is a 74 y.o. female here today for a evaluation of a Left groin lymph node. Patient states their is swelling present 3+ months. Patient was seen at least 6 years ago for the same problem. She has been followed by Blain Pais, M.D. for medical oncology. Patient had a PET scan on 04/12/14. She stopped treatment in 2015.  HPI  Past Medical History  Diagnosis Date  . Hypertension   . Hypercholesteremia   . Lymphoma 2007  . Benign neoplasm of rectum and anal canal   . Chronic leukemia of unspecified cell type, without mention of having achieved remission     Past Surgical History  Procedure Laterality Date  . Appendectomy    . Colonoscopy  2009, 2012     hyperplastic rectal polyp 2012 tubular adenoma of proximal ascending colon 2000 9 repeat exam due to 2017.  . Abdominal hysterectomy  1958  . Flexible sigmoidoscopy  1998  . Coronary artery bypass graft  1999  . Cardiac catheterization  2014  . Breast surgery Left 1990    biopsy  . Breast surgery Right May 08, 2012    complex fibroadenoma without malignancy.    Family History  Problem Relation Age of Onset  . Cancer Other     breast cancer nieces  . Lung cancer Father     Social History History  Substance Use Topics  . Smoking status: Never Smoker   . Smokeless tobacco: Never Used  . Alcohol Use: Yes    No Known Allergies  Current Outpatient Prescriptions  Medication Sig Dispense Refill  . aspirin 81 MG tablet Take 81 mg by mouth daily.    [name] levothyroxine (SYNTHROID, LEVOTHROID) 100 MCG tablet Take 88 mcg by mouth daily before breakfast.     . lisinopril-hydrochlorothiazide (PRINZIDE,ZESTORETIC) 20-12.5 MG per tablet Take 1 tablet by mouth daily.    . Melatonin 1 MG CAPS Take 1 capsule by mouth at bedtime.    . simvastatin  (ZOCOR) 40 MG tablet Take 40 mg by mouth every evening.    . solifenacin (VESICARE) 5 MG tablet Take 5 mg by mouth daily.     No current facility-administered medications for this visit.    Review of Systems Review of Systems  Constitutional: Negative.   Respiratory: Negative.   Cardiovascular: Positive for leg swelling (left).    Blood pressure 132/82, pulse 76, resp. rate 12, height 5\' 5"  (1.651 m), weight 158 lb (71.668 kg).  Physical Exam Physical Exam  Constitutional: She is oriented to person, place, and time. She appears well-developed and well-nourished.  Eyes: Conjunctivae are normal. No scleral icterus.  Neck: Neck supple.  Cardiovascular: Normal rate, regular rhythm and normal heart sounds.   Pulses:      Dorsalis pedis pulses are 2+ on the right side, and 2+ on the left side.       Posterior tibial pulses are 2+ on the right side, and 2+ on the left side.  Bilateral leg mild edema. 1+  Pulmonary/Chest: Effort normal and breath sounds normal.  Abdominal: Soft. Normal appearance and bowel sounds are normal. There is no splenomegaly or hepatomegaly. There is no tenderness.  Lymphadenopathy:    She has cervical adenopathy.       Right cervical: Posterior cervical adenopathy present.  Left cervical: Posterior cervical adenopathy present.    She has axillary adenopathy.       Right: Inguinal (small ) adenopathy present.       Left: Inguinal adenopathy present.  Neurological: She is alert and oriented to person, place, and time.  Skin: Skin is warm and dry.    Data Reviewed PET/CT of 04/05/2014 was reviewed. Multiple nodal basins with increased uptake in elevated SUV.    Assessment    Recurrent lymphoma.    Plan    The case was reviewed with Dr. Oliva Bustard by phone. As she has arty an established diagnosis of lymphoma, a core biopsy was adequate from his point of view to his reestablish cell type.  The patient was amenable to core biopsy.  Ultrasound  examination of the left axilla shows a markedly enlarged lymph node measuring 2.0 x 2.9 x 6.06 cm.  10 mL of 0.5% Xylocaine with 0.25% Marcaine with 1-200,000 of epinephrine was utilized well tolerated. ChloraPrep was applied to the skin and multiple samples with a 14-gauge Bard biopsy device were obtained. The specimen was hand delivered to pathology with telephone report suggestive of lymphoma. Flow cytometry will be completed.  The patient will have a wound check with the staff in one week.     PCP:  Cranford Mon, Federico Flake 04/14/2014, 6:44 AM

## 2014-04-14 DIAGNOSIS — R599 Enlarged lymph nodes, unspecified: Secondary | ICD-10-CM | POA: Insufficient documentation

## 2014-04-15 LAB — PATHOLOGY

## 2014-04-19 ENCOUNTER — Telehealth: Payer: Self-pay | Admitting: *Deleted

## 2014-04-19 NOTE — Telephone Encounter (Signed)
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 Final report is pending. Treatment plans will be based on Dr. Metro Kung recommendations.

## 2014-04-19 NOTE — Telephone Encounter (Signed)
Pt is looking for her results on a BX that she had done on 04/13/14

## 2014-04-23 ENCOUNTER — Ambulatory Visit
Admit: 2014-04-23 | Disposition: A | Discharge status: Home / Self Care | Payer: Self-pay | Attending: Oncology | Admitting: Oncology

## 2014-04-23 DIAGNOSIS — I10 Essential (primary) hypertension: Secondary | ICD-10-CM | POA: Diagnosis not present

## 2014-04-23 DIAGNOSIS — Z803 Family history of malignant neoplasm of breast: Secondary | ICD-10-CM | POA: Diagnosis not present

## 2014-04-23 DIAGNOSIS — Z801 Family history of malignant neoplasm of trachea, bronchus and lung: Secondary | ICD-10-CM | POA: Diagnosis not present

## 2014-04-23 DIAGNOSIS — Z7982 Long term (current) use of aspirin: Secondary | ICD-10-CM | POA: Diagnosis not present

## 2014-04-23 DIAGNOSIS — R161 Splenomegaly, not elsewhere classified: Secondary | ICD-10-CM | POA: Diagnosis not present

## 2014-04-23 DIAGNOSIS — I2510 Atherosclerotic heart disease of native coronary artery without angina pectoris: Secondary | ICD-10-CM | POA: Diagnosis not present

## 2014-04-23 DIAGNOSIS — Z9221 Personal history of antineoplastic chemotherapy: Secondary | ICD-10-CM | POA: Diagnosis not present

## 2014-04-23 DIAGNOSIS — Z90710 Acquired absence of both cervix and uterus: Secondary | ICD-10-CM | POA: Diagnosis not present

## 2014-04-23 DIAGNOSIS — Z951 Presence of aortocoronary bypass graft: Secondary | ICD-10-CM | POA: Diagnosis not present

## 2014-04-23 DIAGNOSIS — F419 Anxiety disorder, unspecified: Secondary | ICD-10-CM | POA: Diagnosis not present

## 2014-04-23 DIAGNOSIS — R599 Enlarged lymph nodes, unspecified: Secondary | ICD-10-CM | POA: Diagnosis not present

## 2014-04-23 DIAGNOSIS — D709 Neutropenia, unspecified: Secondary | ICD-10-CM | POA: Diagnosis not present

## 2014-04-23 DIAGNOSIS — T451X5S Adverse effect of antineoplastic and immunosuppressive drugs, sequela: Secondary | ICD-10-CM | POA: Diagnosis not present

## 2014-04-23 DIAGNOSIS — C8590 Non-Hodgkin lymphoma, unspecified, unspecified site: Secondary | ICD-10-CM | POA: Diagnosis not present

## 2014-04-23 DIAGNOSIS — Z418 Encounter for other procedures for purposes other than remedying health state: Secondary | ICD-10-CM | POA: Diagnosis not present

## 2014-04-23 DIAGNOSIS — Z79899 Other long term (current) drug therapy: Secondary | ICD-10-CM | POA: Diagnosis not present

## 2014-04-23 DIAGNOSIS — Z9049 Acquired absence of other specified parts of digestive tract: Secondary | ICD-10-CM | POA: Diagnosis not present

## 2014-04-26 DIAGNOSIS — C8590 Non-Hodgkin lymphoma, unspecified, unspecified site: Secondary | ICD-10-CM | POA: Diagnosis not present

## 2014-04-26 DIAGNOSIS — T451X5S Adverse effect of antineoplastic and immunosuppressive drugs, sequela: Secondary | ICD-10-CM | POA: Diagnosis not present

## 2014-04-26 DIAGNOSIS — Z79899 Other long term (current) drug therapy: Secondary | ICD-10-CM | POA: Diagnosis not present

## 2014-04-26 DIAGNOSIS — Z418 Encounter for other procedures for purposes other than remedying health state: Secondary | ICD-10-CM | POA: Diagnosis not present

## 2014-04-26 DIAGNOSIS — D709 Neutropenia, unspecified: Secondary | ICD-10-CM | POA: Diagnosis not present

## 2014-04-26 DIAGNOSIS — R599 Enlarged lymph nodes, unspecified: Secondary | ICD-10-CM | POA: Diagnosis not present

## 2014-04-27 DIAGNOSIS — H2511 Age-related nuclear cataract, right eye: Secondary | ICD-10-CM | POA: Diagnosis not present

## 2014-05-05 DIAGNOSIS — C8590 Non-Hodgkin lymphoma, unspecified, unspecified site: Secondary | ICD-10-CM | POA: Diagnosis not present

## 2014-05-05 DIAGNOSIS — H2511 Age-related nuclear cataract, right eye: Secondary | ICD-10-CM | POA: Diagnosis not present

## 2014-05-05 DIAGNOSIS — D709 Neutropenia, unspecified: Secondary | ICD-10-CM | POA: Diagnosis not present

## 2014-05-05 DIAGNOSIS — R599 Enlarged lymph nodes, unspecified: Secondary | ICD-10-CM | POA: Diagnosis not present

## 2014-05-05 DIAGNOSIS — T451X5S Adverse effect of antineoplastic and immunosuppressive drugs, sequela: Secondary | ICD-10-CM | POA: Diagnosis not present

## 2014-05-11 DIAGNOSIS — D709 Neutropenia, unspecified: Secondary | ICD-10-CM | POA: Diagnosis not present

## 2014-05-11 DIAGNOSIS — T451X5S Adverse effect of antineoplastic and immunosuppressive drugs, sequela: Secondary | ICD-10-CM | POA: Diagnosis not present

## 2014-05-11 DIAGNOSIS — Z79899 Other long term (current) drug therapy: Secondary | ICD-10-CM | POA: Diagnosis not present

## 2014-05-11 DIAGNOSIS — R599 Enlarged lymph nodes, unspecified: Secondary | ICD-10-CM | POA: Diagnosis not present

## 2014-05-11 DIAGNOSIS — C8590 Non-Hodgkin lymphoma, unspecified, unspecified site: Secondary | ICD-10-CM | POA: Diagnosis not present

## 2014-05-11 DIAGNOSIS — Z418 Encounter for other procedures for purposes other than remedying health state: Secondary | ICD-10-CM | POA: Diagnosis not present

## 2014-05-11 LAB — COMPREHENSIVE METABOLIC PANEL
Albumin: 4.7 g/dL
Alkaline Phosphatase: 47 U/L
Anion Gap: 9 (ref 7–16)
BILIRUBIN TOTAL: 1 mg/dL
BUN: 22 mg/dL — AB
CHLORIDE: 103 mmol/L
CO2: 29 mmol/L
CREATININE: 1.07 mg/dL — AB
Calcium, Total: 10.1 mg/dL
EGFR (African American): 59 — ABNORMAL LOW
GFR CALC NON AF AMER: 51 — AB
Glucose: 107 mg/dL — ABNORMAL HIGH
POTASSIUM: 3.6 mmol/L
SGOT(AST): 29 U/L
SGPT (ALT): 20 U/L
Sodium: 141 mmol/L
Total Protein: 7.3 g/dL

## 2014-05-11 LAB — CBC CANCER CENTER
BASOS PCT: 1.3 %
Basophil #: 0.1 x10 3/mm (ref 0.0–0.1)
EOS ABS: 0.1 x10 3/mm (ref 0.0–0.7)
Eosinophil %: 2.5 %
HCT: 44.5 % (ref 35.0–47.0)
HGB: 15 g/dL (ref 12.0–16.0)
Lymphocyte #: 1.5 x10 3/mm (ref 1.0–3.6)
Lymphocyte %: 31 %
MCH: 29.5 pg (ref 26.0–34.0)
MCHC: 33.8 g/dL (ref 32.0–36.0)
MCV: 87 fL (ref 80–100)
MONO ABS: 0.5 x10 3/mm (ref 0.2–0.9)
Monocyte %: 10.6 %
NEUTROS ABS: 2.6 x10 3/mm (ref 1.4–6.5)
NEUTROS PCT: 54.6 %
PLATELETS: 128 x10 3/mm — AB (ref 150–440)
RBC: 5.1 10*6/uL (ref 3.80–5.20)
RDW: 14 % (ref 11.5–14.5)
WBC: 4.7 x10 3/mm (ref 3.6–11.0)

## 2014-05-11 LAB — LACTATE DEHYDROGENASE: LDH: 161 U/L

## 2014-05-12 DIAGNOSIS — R599 Enlarged lymph nodes, unspecified: Secondary | ICD-10-CM | POA: Diagnosis not present

## 2014-05-12 DIAGNOSIS — Z418 Encounter for other procedures for purposes other than remedying health state: Secondary | ICD-10-CM | POA: Diagnosis not present

## 2014-05-12 DIAGNOSIS — D709 Neutropenia, unspecified: Secondary | ICD-10-CM | POA: Diagnosis not present

## 2014-05-12 DIAGNOSIS — Z79899 Other long term (current) drug therapy: Secondary | ICD-10-CM | POA: Diagnosis not present

## 2014-05-12 DIAGNOSIS — C8590 Non-Hodgkin lymphoma, unspecified, unspecified site: Secondary | ICD-10-CM | POA: Diagnosis not present

## 2014-05-12 DIAGNOSIS — T451X5S Adverse effect of antineoplastic and immunosuppressive drugs, sequela: Secondary | ICD-10-CM | POA: Diagnosis not present

## 2014-05-14 DIAGNOSIS — I10 Essential (primary) hypertension: Secondary | ICD-10-CM | POA: Insufficient documentation

## 2014-05-14 DIAGNOSIS — R519 Headache, unspecified: Secondary | ICD-10-CM | POA: Insufficient documentation

## 2014-05-14 DIAGNOSIS — E039 Hypothyroidism, unspecified: Secondary | ICD-10-CM | POA: Insufficient documentation

## 2014-05-14 DIAGNOSIS — Q245 Malformation of coronary vessels: Secondary | ICD-10-CM | POA: Insufficient documentation

## 2014-05-14 DIAGNOSIS — Z87718 Personal history of other specified (corrected) congenital malformations of genitourinary system: Secondary | ICD-10-CM | POA: Insufficient documentation

## 2014-05-14 DIAGNOSIS — B029 Zoster without complications: Secondary | ICD-10-CM | POA: Insufficient documentation

## 2014-05-14 DIAGNOSIS — D126 Benign neoplasm of colon, unspecified: Secondary | ICD-10-CM | POA: Insufficient documentation

## 2014-05-14 DIAGNOSIS — Z9889 Other specified postprocedural states: Secondary | ICD-10-CM | POA: Insufficient documentation

## 2014-05-14 DIAGNOSIS — Z853 Personal history of malignant neoplasm of breast: Secondary | ICD-10-CM | POA: Insufficient documentation

## 2014-05-14 DIAGNOSIS — I7300 Raynaud's syndrome without gangrene: Secondary | ICD-10-CM | POA: Insufficient documentation

## 2014-05-14 DIAGNOSIS — N3281 Overactive bladder: Secondary | ICD-10-CM | POA: Insufficient documentation

## 2014-05-14 DIAGNOSIS — R51 Headache: Secondary | ICD-10-CM

## 2014-05-14 DIAGNOSIS — E7800 Pure hypercholesterolemia, unspecified: Secondary | ICD-10-CM | POA: Insufficient documentation

## 2014-05-14 DIAGNOSIS — Z87448 Personal history of other diseases of urinary system: Secondary | ICD-10-CM | POA: Insufficient documentation

## 2014-05-17 DIAGNOSIS — H2511 Age-related nuclear cataract, right eye: Secondary | ICD-10-CM | POA: Diagnosis not present

## 2014-05-17 DIAGNOSIS — H25012 Cortical age-related cataract, left eye: Secondary | ICD-10-CM | POA: Diagnosis not present

## 2014-05-25 DIAGNOSIS — H2512 Age-related nuclear cataract, left eye: Secondary | ICD-10-CM | POA: Diagnosis not present

## 2014-06-01 DIAGNOSIS — I2510 Atherosclerotic heart disease of native coronary artery without angina pectoris: Secondary | ICD-10-CM | POA: Diagnosis not present

## 2014-06-01 DIAGNOSIS — C8590 Non-Hodgkin lymphoma, unspecified, unspecified site: Secondary | ICD-10-CM | POA: Diagnosis not present

## 2014-06-01 DIAGNOSIS — E782 Mixed hyperlipidemia: Secondary | ICD-10-CM | POA: Diagnosis not present

## 2014-06-01 DIAGNOSIS — I25810 Atherosclerosis of coronary artery bypass graft(s) without angina pectoris: Secondary | ICD-10-CM | POA: Diagnosis not present

## 2014-06-01 DIAGNOSIS — I10 Essential (primary) hypertension: Secondary | ICD-10-CM | POA: Diagnosis not present

## 2014-06-01 DIAGNOSIS — E780 Pure hypercholesterolemia: Secondary | ICD-10-CM | POA: Diagnosis not present

## 2014-06-01 DIAGNOSIS — H2512 Age-related nuclear cataract, left eye: Secondary | ICD-10-CM | POA: Diagnosis not present

## 2014-06-01 DIAGNOSIS — E039 Hypothyroidism, unspecified: Secondary | ICD-10-CM | POA: Diagnosis not present

## 2014-06-04 ENCOUNTER — Other Ambulatory Visit: Payer: Self-pay | Admitting: *Deleted

## 2014-06-04 DIAGNOSIS — C8590 Non-Hodgkin lymphoma, unspecified, unspecified site: Secondary | ICD-10-CM

## 2014-06-08 ENCOUNTER — Inpatient Hospital Stay: Payer: Medicare Other | Attending: Oncology | Admitting: Oncology

## 2014-06-08 ENCOUNTER — Encounter (INDEPENDENT_AMBULATORY_CARE_PROVIDER_SITE_OTHER): Payer: Self-pay

## 2014-06-08 ENCOUNTER — Other Ambulatory Visit: Payer: Self-pay | Admitting: Oncology

## 2014-06-08 ENCOUNTER — Inpatient Hospital Stay: Payer: Medicare Other

## 2014-06-08 VITALS — BP 123/82 | HR 71 | Temp 95.1°F | Wt 148.4 lb

## 2014-06-08 VITALS — BP 98/61 | HR 70 | Resp 18

## 2014-06-08 DIAGNOSIS — Z803 Family history of malignant neoplasm of breast: Secondary | ICD-10-CM | POA: Insufficient documentation

## 2014-06-08 DIAGNOSIS — Z7982 Long term (current) use of aspirin: Secondary | ICD-10-CM | POA: Diagnosis not present

## 2014-06-08 DIAGNOSIS — C8590 Non-Hodgkin lymphoma, unspecified, unspecified site: Secondary | ICD-10-CM

## 2014-06-08 DIAGNOSIS — E780 Pure hypercholesterolemia: Secondary | ICD-10-CM | POA: Diagnosis not present

## 2014-06-08 DIAGNOSIS — Z79899 Other long term (current) drug therapy: Secondary | ICD-10-CM | POA: Diagnosis not present

## 2014-06-08 DIAGNOSIS — Z90710 Acquired absence of both cervix and uterus: Secondary | ICD-10-CM | POA: Diagnosis not present

## 2014-06-08 DIAGNOSIS — I10 Essential (primary) hypertension: Secondary | ICD-10-CM | POA: Diagnosis not present

## 2014-06-08 DIAGNOSIS — Z5111 Encounter for antineoplastic chemotherapy: Secondary | ICD-10-CM | POA: Insufficient documentation

## 2014-06-08 DIAGNOSIS — R1909 Other intra-abdominal and pelvic swelling, mass and lump: Secondary | ICD-10-CM | POA: Insufficient documentation

## 2014-06-08 DIAGNOSIS — Z9049 Acquired absence of other specified parts of digestive tract: Secondary | ICD-10-CM

## 2014-06-08 DIAGNOSIS — Z951 Presence of aortocoronary bypass graft: Secondary | ICD-10-CM

## 2014-06-08 DIAGNOSIS — Z800 Family history of malignant neoplasm of digestive organs: Secondary | ICD-10-CM | POA: Insufficient documentation

## 2014-06-08 LAB — CBC WITH DIFFERENTIAL/PLATELET
Basophils Absolute: 0.1 10*3/uL (ref 0–0.1)
Basophils Relative: 2 %
EOS ABS: 0.3 10*3/uL (ref 0–0.7)
Eosinophils Relative: 6 %
HCT: 40.5 % (ref 35.0–47.0)
HEMOGLOBIN: 13.7 g/dL (ref 12.0–16.0)
LYMPHS ABS: 0.5 10*3/uL — AB (ref 1.0–3.6)
LYMPHS PCT: 13 %
MCH: 29 pg (ref 26.0–34.0)
MCHC: 33.7 g/dL (ref 32.0–36.0)
MCV: 86.2 fL (ref 80.0–100.0)
MONOS PCT: 12 %
Monocytes Absolute: 0.5 10*3/uL (ref 0.2–0.9)
NEUTROS ABS: 2.9 10*3/uL (ref 1.4–6.5)
Neutrophils Relative %: 67 %
Platelets: 139 10*3/uL — ABNORMAL LOW (ref 150–440)
RBC: 4.7 MIL/uL (ref 3.80–5.20)
RDW: 14.1 % (ref 11.5–14.5)
WBC: 4.3 10*3/uL (ref 3.6–11.0)

## 2014-06-08 LAB — COMPREHENSIVE METABOLIC PANEL
ALT: 23 U/L (ref 14–54)
ANION GAP: 6 (ref 5–15)
AST: 29 U/L (ref 15–41)
Albumin: 4 g/dL (ref 3.5–5.0)
Alkaline Phosphatase: 53 U/L (ref 38–126)
BILIRUBIN TOTAL: 0.8 mg/dL (ref 0.3–1.2)
BUN: 16 mg/dL (ref 6–20)
CO2: 29 mmol/L (ref 22–32)
CREATININE: 0.92 mg/dL (ref 0.44–1.00)
Calcium: 9.9 mg/dL (ref 8.9–10.3)
Chloride: 105 mmol/L (ref 101–111)
GFR calc non Af Amer: 59 mL/min — ABNORMAL LOW (ref >60)
Glucose, Bld: 111 mg/dL — ABNORMAL HIGH (ref 65–99)
Potassium: 3.8 mmol/L (ref 3.5–5.1)
Sodium: 140 mmol/L (ref 135–145)
TOTAL PROTEIN: 6.5 g/dL (ref 6.5–8.1)

## 2014-06-08 LAB — LACTATE DEHYDROGENASE: LDH: 165 U/L (ref 98–192)

## 2014-06-08 LAB — MAGNESIUM: Magnesium: 1.6 mg/dL — ABNORMAL LOW (ref 1.7–2.4)

## 2014-06-08 MED ORDER — DIPHENHYDRAMINE HCL 25 MG PO CAPS
50.0000 mg | ORAL_CAPSULE | Freq: Once | ORAL | Status: AC
  Administered 2014-06-08: 50 mg via ORAL
  Filled 2014-06-08: qty 2

## 2014-06-08 MED ORDER — SODIUM CHLORIDE 0.9 % IV SOLN
80.0000 mg/m2 | Freq: Once | INTRAVENOUS | Status: AC
  Administered 2014-06-08: 144 mg via INTRAVENOUS
  Filled 2014-06-08: qty 1.6

## 2014-06-08 MED ORDER — SODIUM CHLORIDE 0.9 % IV SOLN
375.0000 mg/m2 | Freq: Once | INTRAVENOUS | Status: AC
  Administered 2014-06-08: 700 mg via INTRAVENOUS
  Filled 2014-06-08: qty 60

## 2014-06-08 MED ORDER — SODIUM CHLORIDE 0.9 % IV SOLN
Freq: Once | INTRAVENOUS | Status: AC
  Administered 2014-06-08: 11:00:00 via INTRAVENOUS
  Filled 2014-06-08: qty 1000

## 2014-06-08 MED ORDER — SODIUM CHLORIDE 0.9 % IV SOLN
Freq: Once | INTRAVENOUS | Status: AC
  Administered 2014-06-08: 13:00:00 via INTRAVENOUS
  Filled 2014-06-08: qty 4

## 2014-06-08 MED ORDER — BENDAMUSTINE HCL CHEMO INJECTION 180 MG/2ML: 80.0000 mg/m2 | Freq: Once | INTRAVENOUS | Status: DC

## 2014-06-08 MED ORDER — SODIUM CHLORIDE 0.9 % IV SOLN: 375.0000 mg/m2 | Freq: Once | INTRAVENOUS | Status: DC

## 2014-06-08 MED ORDER — SODIUM CHLORIDE 0.9 % IV SOLN: 80.0000 mg/m2 | Freq: Once | INTRAVENOUS | Status: DC

## 2014-06-08 MED ORDER — ACETAMINOPHEN 325 MG PO TABS
650.0000 mg | ORAL_TABLET | Freq: Once | ORAL | Status: AC
  Administered 2014-06-08: 650 mg via ORAL
  Filled 2014-06-08: qty 2

## 2014-06-08 NOTE — Progress Notes (Signed)
[place] [facility] Telephone:([phone]  Fax:([phone]) [place] OB: May 24, 1940  MR#: [id number]  ELF#:[id number]  Patient Care Team: [name], MD as PCP - General (Unknown Physician Specialty) Robert Bellow, MD (General Surgery) Forest Gleason, MD (Oncology)  CHIEF COMPLAINT:  Chief Complaint  Patient presents with  . Follow-up    Oncology History   1. Lymphoma low-grade, status Rituxan therapy. 2. Recurrent disease by clinical examination in December of 2012 3. Ovarian mass on PET scan in December of 2012 (normal CA 125). Ovarian mass has resolved after rituximab therapy. 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, 2016) 6, patient started on bendamustine and Rituxan because of progressive disease and symptomatic disease (May 11, 2014)     Lymphoma   04/14/2014 Initial Diagnosis Lymphoma    No flowsheet data found.  INTERVAL HISTORY: 74 year old lady with stage IV facility lymphoma with progressive disease and symptomatic disease was started on bendamustine tolerated first treatment very well here for second treatment consideration.  Had mild nausea grade 2 responded to Zofran.  No soreness in the mouth.  See [name] noticed an very pleased with reduction in the size of the lymph node.  Chills.  No fever.  REVIEW OF SYSTEMS:   GENERAL:  Feels good.  Active.  No fevers, sweats or weight loss. PERFORMANCE STATUS (ECOG):  01 HEENT:  No visual changes, runny nose, sore throat, mouth sores or tenderness. Lungs: No shortness of breath or cough.  No hemoptysis. Cardiac:  No chest pain, palpitations, orthopnea, or PND. GI:  No nausea, vomiting, diarrhea, constipation, melena or hematochezia. GU:  No urgency, frequency, dysuria, or hematuria. Musculoskeletal:  No back pain.  No joint pain.  No muscle tenderness. Extremities:  No pain or swelling. Skin:  No rashes or  skin changes. Neuro:  No headache, numbness or weakness, balance or coordination issues. Endocrine:  No diabetes, thyroid issues, hot flashes or night sweats. Psych:  No mood changes, depression or anxiety. Pain:  No focal pain. Review of systems:  All other systems reviewed and found to be negative.  As per HPI. Otherwise, a complete review of systems is negatve.  PAST MEDICAL HISTORY: Past Medical History  Diagnosis Date  . Hypertension   . Hypercholesteremia   . Lymphoma 2007  . Benign neoplasm of rectum and anal canal   . Chronic leukemia of unspecified cell type, without mention of having achieved remission     PAST SURGICAL HISTORY: Past Surgical History  Procedure Laterality Date  . Colonoscopy  2009, 2012     hyperplastic rectal polyp 2012 tubular adenoma of proximal ascending colon 2000 9 repeat exam due to 2017.  [name] Flexible sigmoidoscopy  1998  . Cardiac catheterization  2014  . Appendectomy    . Breast surgery Left 1990    biopsy  . Breast surgery Right May 08, 2012    complex fibroadenoma without malignancy.  . Coronary artery bypass graft  1999  . Abdominal hysterectomy  1958    FAMILY HISTORY Family History  Problem Relation Age of Onset  . Cancer Other     breast cancer niece, pancreatic cancer niece  . Lung cancer Father     Died age 37  . Heart disease Mother     Died age 29  . Asthma Mother   . CAD Brother     CABG age 37  . Drug abuse Daughter   .  Diabetes Maternal Uncle   . Early death Paternal 81   . Heart attack Sister   . Tuberculosis Sister     Teen years  . Lung cancer Sister     Died in her 32s  . Heart attack Sister   . Angina Sister   . Breast cancer Sister   . Rheum arthritis Sister   . Aneurysm Sister     Brain, died age 107  . Angina Sister   . Angina Sister   . Kidney cancer Sister   . Heart disease Brother     Stent placement  . Heart disease Brother     stent placement x 2    GYNECOLOGIC HISTORY:  No LMP  recorded. Patient has had a hysterectomy.     ADVANCED DIRECTIVES:    HEALTH MAINTENANCE: History  Substance Use Topics  . Smoking status: Never Smoker   . Smokeless tobacco: Never Used  . Alcohol Use: 1.8 - 2.4 oz/week    3-4 Shots of liquor per week     Colonoscopy:  PAP:  Bone density:  Lipid panel:  No Known Allergies  Current Outpatient Prescriptions  Medication Sig Dispense Refill  . aspirin 81 MG tablet Take 81 mg by mouth daily.    [name] levothyroxine (SYNTHROID, LEVOTHROID) 100 MCG tablet Take 88 mcg by mouth daily before breakfast.     . lisinopril-hydrochlorothiazide (PRINZIDE,ZESTORETIC) 20-12.5 MG per tablet Take 1 tablet by mouth daily.    . Melatonin 1 MG CAPS Take 1 capsule by mouth at bedtime.    . solifenacin (VESICARE) 5 MG tablet Take 5 mg by mouth daily.    . ASPIRIN EC LOW STRENGTH PO Take 1 tablet by mouth 1 day or 1 dose.    . levothyroxine (SYNTHROID, LEVOTHROID) 88 MCG tablet Take 1 tablet by mouth 1 day or 1 dose.    [name] lisinopril-hydrochlorothiazide (PRINZIDE,ZESTORETIC) 20-12.5 MG per tablet Take 1 tablet by mouth 1 day or 1 dose.    . nitrofurantoin, macrocrystal-monohydrate, (MACROBID) 100 MG capsule Take 1 capsule by mouth 2 (two) times daily.    . simvastatin (ZOCOR) 40 MG tablet Take 40 mg by mouth every evening.    . solifenacin (VESICARE) 10 MG tablet Take 1 tablet by mouth 1 day or 1 dose.    . solifenacin (VESICARE) 5 MG tablet Take 1 tablet by mouth 1 day or 1 dose.     No current facility-administered medications for this visit.    OBJECTIVE:  Filed Vitals:   06/08/14 0946  BP: 123/82  Pulse: 71  Temp: 95.1 F (35.1 C)     Body mass index is 24.69 kg/(m^2).    ECOG FS:1 - Symptomatic but completely ambulatory  PHYSICAL EXAM: Gen. status: Patient is alert oriented not any acute distress performance status..Examination of the chest was unremarkable. There were no bony deformities, no asymmetry, and no other abnormalities.Abdominal  exam revealed normal bowel sounds. The abdomen was soft, non-tender, and without masses, organomegaly, or appreciable enlargement of the abdominal aorta.  Lymphatic system: Palpable axillary lymph node has decreased in size inguinal lymph node a decrease in size.Neurologically, the patient was awake, alert, and oriented to person, place and time. There were no obvious focal neurologic abnormalities.. Neck was supple and without jugular venous distension, thyromegaly, or carotid bruits. Carotids were easily palpable bilaterally. There was no adenopathy.  Other systems were examined LAB RESULTS:  Appointment on 06/08/2014  Component Date Value Ref Range Status  . WBC 06/08/2014 4.3  3.6 - 11.0 K/uL Final  . RBC 06/08/2014 4.70  3.80 - 5.20 MIL/uL Final  . Hemoglobin 06/08/2014 13.7  12.0 - 16.0 g/dL Final  . HCT 06/08/2014 40.5  35.0 - 47.0 % Final  . MCV 06/08/2014 86.2  80.0 - 100.0 fL Final  . MCH 06/08/2014 29.0  26.0 - 34.0 pg Final  . MCHC 06/08/2014 33.7  32.0 - 36.0 g/dL Final  . RDW 06/08/2014 14.1  11.5 - 14.5 % Final  . Platelets 06/08/2014 139* 150 - 440 K/uL Final  . Neutrophils Relative % 06/08/2014 67   Final  . Neutro Abs 06/08/2014 2.9  1.4 - 6.5 K/uL Final  . Lymphocytes Relative 06/08/2014 13   Final  . Lymphs Abs 06/08/2014 0.5* 1.0 - 3.6 K/uL Final  . Monocytes Relative 06/08/2014 12   Final  . Monocytes Absolute 06/08/2014 0.5  0.2 - 0.9 K/uL Final  . Eosinophils Relative 06/08/2014 6   Final  . Eosinophils Absolute 06/08/2014 0.3  0 - 0.7 K/uL Final  . Basophils Relative 06/08/2014 2   Final  . Basophils Absolute 06/08/2014 0.1  0 - 0.1 K/uL Final    No results found for: LABCA2 No results found for: CA199 No results found for: CEA No results found for: PSA Lab Results  Component Value Date   CA125 18.9 01/22/2011     STUDIES: No results found.  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   Patient is symptomatic significant improvement in lymph node as well as the symptoms after first cycle of chemotherapy  MEDICAL DECISION MAKING:  All lab data has been reviewed.  All the questions patient had is been answered. We will continue chemotherapy  Patient expressed understanding and was in agreement with this plan. She also understands that She can call clinic at any time with any questions, concerns, or complaints.    Lymphoma   Staging form: Lymphoid Neoplasms, AJCC 6th Edition     Clinical: No stage assigned - Marni Griffon, MD   06/08/2014 10:07 AM

## 2014-06-09 ENCOUNTER — Encounter (INDEPENDENT_AMBULATORY_CARE_PROVIDER_SITE_OTHER): Payer: Self-pay

## 2014-06-09 ENCOUNTER — Inpatient Hospital Stay: Payer: Medicare Other

## 2014-06-09 VITALS — BP 122/74 | HR 67 | Temp 96.0°F | Resp 16

## 2014-06-09 DIAGNOSIS — I10 Essential (primary) hypertension: Secondary | ICD-10-CM | POA: Diagnosis not present

## 2014-06-09 DIAGNOSIS — E780 Pure hypercholesterolemia: Secondary | ICD-10-CM | POA: Diagnosis not present

## 2014-06-09 DIAGNOSIS — C8590 Non-Hodgkin lymphoma, unspecified, unspecified site: Secondary | ICD-10-CM | POA: Diagnosis not present

## 2014-06-09 DIAGNOSIS — Z7982 Long term (current) use of aspirin: Secondary | ICD-10-CM | POA: Diagnosis not present

## 2014-06-09 DIAGNOSIS — Z79899 Other long term (current) drug therapy: Secondary | ICD-10-CM | POA: Diagnosis not present

## 2014-06-09 DIAGNOSIS — Z5111 Encounter for antineoplastic chemotherapy: Secondary | ICD-10-CM | POA: Diagnosis not present

## 2014-06-09 MED ORDER — SODIUM CHLORIDE 0.9 % IV SOLN
144.0000 mg | Freq: Once | INTRAVENOUS | Status: AC
  Administered 2014-06-09: 144 mg via INTRAVENOUS
  Filled 2014-06-09: qty 1.6

## 2014-06-09 MED ORDER — DEXAMETHASONE SODIUM PHOSPHATE 100 MG/10ML IJ SOLN
Freq: Once | INTRAMUSCULAR | Status: AC
  Administered 2014-06-09: 15:00:00 via INTRAVENOUS
  Filled 2014-06-09: qty 4

## 2014-06-09 MED ORDER — SODIUM CHLORIDE 0.9 % IV SOLN
Freq: Once | INTRAVENOUS | Status: AC
  Administered 2014-06-09: 14:00:00 via INTRAVENOUS
  Filled 2014-06-09: qty 250

## 2014-06-09 MED ORDER — PEGFILGRASTIM 6 MG/0.6ML ~~LOC~~ PSKT
6.0000 mg | PREFILLED_SYRINGE | Freq: Once | SUBCUTANEOUS | Status: AC
  Administered 2014-06-09: 6 mg via SUBCUTANEOUS
  Filled 2014-06-09: qty 0.6

## 2014-07-06 ENCOUNTER — Ambulatory Visit: Payer: Medicare Other | Admitting: Family Medicine

## 2014-07-06 ENCOUNTER — Other Ambulatory Visit: Payer: Medicare Other

## 2014-07-06 ENCOUNTER — Ambulatory Visit: Payer: Medicare Other

## 2014-07-07 ENCOUNTER — Other Ambulatory Visit: Payer: Self-pay | Admitting: Oncology

## 2014-07-07 ENCOUNTER — Inpatient Hospital Stay: Payer: Medicare Other | Attending: Oncology | Admitting: Family Medicine

## 2014-07-07 ENCOUNTER — Inpatient Hospital Stay: Payer: Medicare Other

## 2014-07-07 ENCOUNTER — Encounter: Payer: Self-pay | Admitting: Oncology

## 2014-07-07 ENCOUNTER — Ambulatory Visit: Payer: Medicare Other

## 2014-07-07 VITALS — BP 103/62 | HR 59 | Temp 95.7°F

## 2014-07-07 VITALS — BP 122/74 | HR 70 | Temp 96.6°F | Wt 134.9 lb

## 2014-07-07 DIAGNOSIS — C8590 Non-Hodgkin lymphoma, unspecified, unspecified site: Secondary | ICD-10-CM | POA: Insufficient documentation

## 2014-07-07 DIAGNOSIS — Z803 Family history of malignant neoplasm of breast: Secondary | ICD-10-CM | POA: Diagnosis not present

## 2014-07-07 DIAGNOSIS — I10 Essential (primary) hypertension: Secondary | ICD-10-CM

## 2014-07-07 DIAGNOSIS — Z800 Family history of malignant neoplasm of digestive organs: Secondary | ICD-10-CM | POA: Insufficient documentation

## 2014-07-07 DIAGNOSIS — Z79899 Other long term (current) drug therapy: Secondary | ICD-10-CM | POA: Insufficient documentation

## 2014-07-07 DIAGNOSIS — E780 Pure hypercholesterolemia: Secondary | ICD-10-CM | POA: Diagnosis not present

## 2014-07-07 DIAGNOSIS — R110 Nausea: Secondary | ICD-10-CM | POA: Diagnosis not present

## 2014-07-07 DIAGNOSIS — Z7982 Long term (current) use of aspirin: Secondary | ICD-10-CM | POA: Diagnosis not present

## 2014-07-07 DIAGNOSIS — Z5111 Encounter for antineoplastic chemotherapy: Secondary | ICD-10-CM | POA: Diagnosis not present

## 2014-07-07 DIAGNOSIS — Z801 Family history of malignant neoplasm of trachea, bronchus and lung: Secondary | ICD-10-CM | POA: Diagnosis not present

## 2014-07-07 LAB — COMPREHENSIVE METABOLIC PANEL
ALT: 28 U/L (ref 14–54)
ANION GAP: 4 — AB (ref 5–15)
AST: 28 U/L (ref 15–41)
Albumin: 4 g/dL (ref 3.5–5.0)
Alkaline Phosphatase: 59 U/L (ref 38–126)
BUN: 22 mg/dL — ABNORMAL HIGH (ref 6–20)
CO2: 27 mmol/L (ref 22–32)
CREATININE: 0.93 mg/dL (ref 0.44–1.00)
Calcium: 9.3 mg/dL (ref 8.9–10.3)
Chloride: 106 mmol/L (ref 101–111)
GFR calc non Af Amer: 59 mL/min — ABNORMAL LOW (ref >60)
Glucose, Bld: 123 mg/dL — ABNORMAL HIGH (ref 65–99)
Potassium: 3.7 mmol/L (ref 3.5–5.1)
SODIUM: 137 mmol/L (ref 135–145)
Total Bilirubin: 1.1 mg/dL (ref 0.3–1.2)
Total Protein: 6.4 g/dL — ABNORMAL LOW (ref 6.5–8.1)

## 2014-07-07 LAB — CBC WITH DIFFERENTIAL/PLATELET
BASOS PCT: 2 %
Basophils Absolute: 0.1 10*3/uL (ref 0–0.1)
EOS PCT: 5 %
Eosinophils Absolute: 0.1 10*3/uL (ref 0–0.7)
HEMATOCRIT: 38 % (ref 35.0–47.0)
HEMOGLOBIN: 12.8 g/dL (ref 12.0–16.0)
Lymphocytes Relative: 21 %
Lymphs Abs: 0.7 10*3/uL — ABNORMAL LOW (ref 1.0–3.6)
MCH: 29.6 pg (ref 26.0–34.0)
MCHC: 33.7 g/dL (ref 32.0–36.0)
MCV: 87.7 fL (ref 80.0–100.0)
MONO ABS: 0.4 10*3/uL (ref 0.2–0.9)
Monocytes Relative: 12 %
NEUTROS ABS: 2 10*3/uL (ref 1.4–6.5)
Neutrophils Relative %: 60 %
Platelets: 144 10*3/uL — ABNORMAL LOW (ref 150–440)
RBC: 4.33 MIL/uL (ref 3.80–5.20)
RDW: 14.9 % — ABNORMAL HIGH (ref 11.5–14.5)
WBC: 3.4 10*3/uL — ABNORMAL LOW (ref 3.6–11.0)

## 2014-07-07 LAB — MAGNESIUM: MAGNESIUM: 1.6 mg/dL — AB (ref 1.7–2.4)

## 2014-07-07 MED ORDER — DIPHENHYDRAMINE HCL 25 MG PO CAPS
50.0000 mg | ORAL_CAPSULE | Freq: Once | ORAL | Status: AC
  Administered 2014-07-07: 50 mg via ORAL
  Filled 2014-07-07: qty 2

## 2014-07-07 MED ORDER — ACETAMINOPHEN 325 MG PO TABS
650.0000 mg | ORAL_TABLET | Freq: Once | ORAL | Status: AC
  Administered 2014-07-07: 650 mg via ORAL
  Filled 2014-07-07: qty 2

## 2014-07-07 MED ORDER — SODIUM CHLORIDE 0.9 % IV SOLN: 375.0000 mg/m2 | Freq: Once | INTRAVENOUS | Status: DC

## 2014-07-07 MED ORDER — SODIUM CHLORIDE 0.9 % IV SOLN
Freq: Once | INTRAVENOUS | Status: AC
  Administered 2014-07-07: 13:00:00 via INTRAVENOUS
  Filled 2014-07-07: qty 4

## 2014-07-07 MED ORDER — RITUXIMAB CHEMO INJECTION 500 MG/50ML
700.0000 mg | Freq: Once | INTRAVENOUS | Status: AC
Start: 2014-07-07 — End: 2014-07-07
  Administered 2014-07-07: 700 mg via INTRAVENOUS
  Filled 2014-07-07: qty 60

## 2014-07-07 MED ORDER — SODIUM CHLORIDE 0.9 % IV SOLN
Freq: Once | INTRAVENOUS | Status: AC
  Administered 2014-07-07: 11:00:00 via INTRAVENOUS
  Filled 2014-07-07: qty 1000

## 2014-07-07 MED ORDER — SODIUM CHLORIDE 0.9 % IJ SOLN
3.0000 mL | INTRAMUSCULAR | Status: DC | PRN
  Filled 2014-07-07: qty 10

## 2014-07-07 MED ORDER — SODIUM CHLORIDE 0.9 % IV SOLN
80.0000 mg/m2 | Freq: Once | INTRAVENOUS | Status: AC
  Administered 2014-07-07: 150 mg via INTRAVENOUS
  Filled 2014-07-07: qty 6

## 2014-07-08 ENCOUNTER — Inpatient Hospital Stay: Payer: Medicare Other

## 2014-07-08 DIAGNOSIS — R110 Nausea: Secondary | ICD-10-CM | POA: Diagnosis not present

## 2014-07-08 DIAGNOSIS — Z5111 Encounter for antineoplastic chemotherapy: Secondary | ICD-10-CM | POA: Diagnosis not present

## 2014-07-08 DIAGNOSIS — C8590 Non-Hodgkin lymphoma, unspecified, unspecified site: Secondary | ICD-10-CM

## 2014-07-08 DIAGNOSIS — Z79899 Other long term (current) drug therapy: Secondary | ICD-10-CM | POA: Diagnosis not present

## 2014-07-08 DIAGNOSIS — I10 Essential (primary) hypertension: Secondary | ICD-10-CM | POA: Diagnosis not present

## 2014-07-08 DIAGNOSIS — Z7982 Long term (current) use of aspirin: Secondary | ICD-10-CM | POA: Diagnosis not present

## 2014-07-08 MED ORDER — SODIUM CHLORIDE 0.9 % IV SOLN
Freq: Once | INTRAVENOUS | Status: AC
  Administered 2014-07-08: 15:00:00 via INTRAVENOUS
  Filled 2014-07-08: qty 1000

## 2014-07-08 MED ORDER — SODIUM CHLORIDE 0.9 % IV SOLN
80.0000 mg/m2 | Freq: Once | INTRAVENOUS | Status: AC
  Administered 2014-07-08: 150 mg via INTRAVENOUS
  Filled 2014-07-08: qty 6

## 2014-07-08 MED ORDER — PEGFILGRASTIM 6 MG/0.6ML ~~LOC~~ PSKT
6.0000 mg | PREFILLED_SYRINGE | Freq: Once | SUBCUTANEOUS | Status: AC
  Administered 2014-07-08: 6 mg via SUBCUTANEOUS
  Filled 2014-07-08: qty 0.6

## 2014-07-08 MED ORDER — SODIUM CHLORIDE 0.9 % IV SOLN
Freq: Once | INTRAVENOUS | Status: AC
  Administered 2014-07-08: 15:00:00 via INTRAVENOUS
  Filled 2014-07-08: qty 4

## 2014-07-13 LAB — LACTATE DEHYDROGENASE: LDH: 197 U/L — AB (ref 98–192)

## 2014-07-22 ENCOUNTER — Other Ambulatory Visit: Payer: Self-pay | Admitting: Family Medicine

## 2014-07-22 NOTE — Progress Notes (Unsigned)
[place]  [phone]  [phone]     Jacqueline Webb DOB: 1940/09/24  MR#: [id number]  OZD#:[id number]  Patient Care Team: [name], MD as PCP - General (Unknown Physician Specialty) Robert Bellow, MD (General Surgery) Forest Gleason, MD (Oncology)  CHIEF COMPLAINT:  Patient is here for continued evaluation of lymphoma and treatment consideration.  INTERVAL HISTORY:  Patient is here for treatment consideration and further evaluation regarding low-grade lymphoma. She is currently on bendamustine and Rituxan due to progressive disease, started 05/11/2014. Today she denies any complaints other than trace lower extremity edema bilaterally. Has been in her usual state of health, active, plays golf.  REVIEW OF SYSTEMS:   Review of Systems  Constitutional: Negative for fever, chills, weight loss, malaise/fatigue and diaphoresis.  HENT: Negative for congestion, ear discharge, ear pain, hearing loss, nosebleeds, sore throat and tinnitus.   Eyes: Negative for blurred vision, double vision, photophobia, pain, discharge and redness.  Respiratory: Negative for cough, hemoptysis, sputum production, shortness of breath, wheezing and stridor.   Cardiovascular: Positive for leg swelling. Negative for chest pain, palpitations, orthopnea, claudication and PND.  Gastrointestinal: Negative for heartburn, nausea, vomiting, abdominal pain, diarrhea, constipation, blood in stool and melena.  Genitourinary: Negative.   Musculoskeletal: Negative.   Skin: Negative.   Neurological: Negative for dizziness, tingling, focal weakness, seizures, weakness and headaches.  Endo/Heme/Allergies: Does not bruise/bleed easily.  Psychiatric/Behavioral: Negative for depression. The patient is not nervous/anxious and does not have insomnia.     As per HPI. Otherwise, a complete review of systems is negatve.  ONCOLOGY HISTORY: Oncology History   1. Lymphoma  low-grade, status Rituxan therapy. 2. Recurrent disease by clinical examination in December of 2012 3. Ovarian mass on PET scan in December of 2012 (normal CA 125). Ovarian mass has resolved after rituximab therapy. 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, 2016) 6, patient started on bendamustine and Rituxan because of progressive disease and symptomatic disease (May 11, 2014)     Lymphoma   04/14/2014 Initial Diagnosis Lymphoma    PAST MEDICAL HISTORY: Past Medical History  Diagnosis Date  . Hypertension   . Hypercholesteremia   . Lymphoma 2007  . Benign neoplasm of rectum and anal canal   . Chronic leukemia of unspecified cell type, without mention of having achieved remission     PAST SURGICAL HISTORY: Past Surgical History  Procedure Laterality Date  . Colonoscopy  2009, 2012     hyperplastic rectal polyp 2012 tubular adenoma of proximal ascending colon 2000 9 repeat exam due to 2017.  [name] Flexible sigmoidoscopy  1998  . Cardiac catheterization  2014  . Appendectomy    . Breast surgery Left 1990    biopsy  . Breast surgery Right May 08, 2012    complex fibroadenoma without malignancy.  . Coronary artery bypass graft  1999  . Abdominal hysterectomy  1958    FAMILY HISTORY Family History  Problem Relation Age of Onset  . Cancer Other     breast cancer niece, pancreatic cancer niece  . Lung cancer Father     Died age 56  . Heart disease Mother     Died age 4  . Asthma Mother   . CAD Brother     CABG age 32  . Drug abuse Daughter   . Diabetes Maternal Uncle   . Early death Paternal 30   . Heart attack Sister   .  Tuberculosis Sister     Teen years  . Lung cancer Sister     Died in her 43s  . Heart attack Sister   . Angina Sister   . Breast cancer Sister   . Rheum arthritis Sister   . Aneurysm Sister     Brain, died age 29  . Angina Sister   . Angina Sister   .  Kidney cancer Sister   . Heart disease Brother     Stent placement  . Heart disease Brother     stent placement x 2    GYNECOLOGIC HISTORY:  No LMP recorded. Patient has had a hysterectomy.     ADVANCED DIRECTIVES:    HEALTH MAINTENANCE: History  Substance Use Topics  . Smoking status: Never Smoker   . Smokeless tobacco: Never Used  . Alcohol Use: 1.8 - 2.4 oz/week    3-4 Shots of liquor per week     Colonoscopy:  PAP:  Bone density:  Lipid panel:  No Known Allergies  Current Outpatient Prescriptions  Medication Sig Dispense Refill  . aspirin 81 MG tablet Take 81 mg by mouth daily.    . ASPIRIN EC LOW STRENGTH PO Take 1 tablet by mouth 1 day or 1 dose.    . levothyroxine (SYNTHROID, LEVOTHROID) 100 MCG tablet Take 88 mcg by mouth daily before breakfast.     . levothyroxine (SYNTHROID, LEVOTHROID) 88 MCG tablet Take 1 tablet by mouth 1 day or 1 dose.    [name] lisinopril-hydrochlorothiazide (PRINZIDE,ZESTORETIC) 20-12.5 MG per tablet Take 1 tablet by mouth daily.    [name] lisinopril-hydrochlorothiazide (PRINZIDE,ZESTORETIC) 20-12.5 MG per tablet Take 1 tablet by mouth 1 day or 1 dose.    . Melatonin 1 MG CAPS Take 1 capsule by mouth at bedtime.    . nitrofurantoin, macrocrystal-monohydrate, (MACROBID) 100 MG capsule Take 1 capsule by mouth 2 (two) times daily.    . simvastatin (ZOCOR) 40 MG tablet Take 40 mg by mouth every evening.    . solifenacin (VESICARE) 10 MG tablet Take 1 tablet by mouth 1 day or 1 dose.    . solifenacin (VESICARE) 5 MG tablet Take 5 mg by mouth daily.    . solifenacin (VESICARE) 5 MG tablet Take 1 tablet by mouth 1 day or 1 dose.     No current facility-administered medications for this visit.   Facility-Administered Medications Ordered in Other Visits  Medication Dose Route Frequency Provider Last Rate Last Dose  . sodium chloride 0.9 % injection 3 mL  3 mL Intravenous PRN Evlyn Kanner, NP        OBJECTIVE: There were no vitals taken for this  visit.   There is no weight on file to calculate BMI.    ECOG FS:0 - Asymptomatic  General: Well-developed, well-nourished, no acute distress. Eyes: Pink conjunctiva, anicteric sclera. HEENT: Normocephalic, moist mucous membranes, clear oropharnyx. Lungs: Clear to auscultation bilaterally. Heart: Regular rate and rhythm. No rubs, murmurs, or gallops. Abdomen: Soft, nontender, nondistended. No organomegaly noted, normoactive bowel sounds. Musculoskeletal: No edema, cyanosis, or clubbing. Neuro: Alert, answering all questions appropriately. Cranial nerves grossly intact. Skin: No rashes or petechiae noted. Psych: Normal affect. Lymphatics: No cervical, calvicular, axillary or inguinal LAD.   LAB RESULTS:  No visits with results within 3 Day(s) from this visit. Latest known visit with results is:  Appointment on 07/07/2014  Component Date Value Ref Range Status  . WBC 07/07/2014 3.4* 3.6 - 11.0 K/uL Final  . RBC 07/07/2014 4.33  3.80 -  5.20 MIL/uL Final  . Hemoglobin 07/07/2014 12.8  12.0 - 16.0 g/dL Final  . HCT 07/07/2014 38.0  35.0 - 47.0 % Final  . MCV 07/07/2014 87.7  80.0 - 100.0 fL Final  . MCH 07/07/2014 29.6  26.0 - 34.0 pg Final  . MCHC 07/07/2014 33.7  32.0 - 36.0 g/dL Final  . RDW 07/07/2014 14.9* 11.5 - 14.5 % Final  . Platelets 07/07/2014 144* 150 - 440 K/uL Final  . Neutrophils Relative % 07/07/2014 60   Final  . Neutro Abs 07/07/2014 2.0  1.4 - 6.5 K/uL Final  . Lymphocytes Relative 07/07/2014 21   Final  . Lymphs Abs 07/07/2014 0.7* 1.0 - 3.6 K/uL Final  . Monocytes Relative 07/07/2014 12   Final  . Monocytes Absolute 07/07/2014 0.4  0.2 - 0.9 K/uL Final  . Eosinophils Relative 07/07/2014 5   Final  . Eosinophils Absolute 07/07/2014 0.1  0 - 0.7 K/uL Final  . Basophils Relative 07/07/2014 2   Final  . Basophils Absolute 07/07/2014 0.1  0 - 0.1 K/uL Final  . Sodium 07/07/2014 137  135 - 145 mmol/L Final  . Potassium 07/07/2014 3.7  3.5 - 5.1 mmol/L Final  .  Chloride 07/07/2014 106  101 - 111 mmol/L Final  . CO2 07/07/2014 27  22 - 32 mmol/L Final  . Glucose, Bld 07/07/2014 123* 65 - 99 mg/dL Final  . BUN 07/07/2014 22* 6 - 20 mg/dL Final  . Creatinine, Ser 07/07/2014 0.93  0.44 - 1.00 mg/dL Final  . Calcium 07/07/2014 9.3  8.9 - 10.3 mg/dL Final  . Total Protein 07/07/2014 6.4* 6.5 - 8.1 g/dL Final  . Albumin 07/07/2014 4.0  3.5 - 5.0 g/dL Final  . AST 07/07/2014 28  15 - 41 U/L Final  . ALT 07/07/2014 28  14 - 54 U/L Final  . Alkaline Phosphatase 07/07/2014 59  38 - 126 U/L Final  . Total Bilirubin 07/07/2014 1.1  0.3 - 1.2 mg/dL Final  . GFR calc non Af Amer 07/07/2014 59* >60 mL/min Final  . GFR calc Af Amer 07/07/2014 >60  >60 mL/min Final   Comment: (NOTE) The eGFR has been calculated using the CKD EPI equation. This calculation has not been validated in all clinical situations. eGFR's persistently <60 mL/min signify possible Chronic Kidney Disease.   . Anion gap 07/07/2014 4* 5 - 15 Final  . Magnesium 07/07/2014 1.6* 1.7 - 2.4 mg/dL Final  . LDH 07/07/2014 197* 98 - 192 U/L Final    STUDIES: No results found.  ASSESSMENT:  Stage IV small lymphocytic lymphoma.  PLAN:   1. Lymphoma. Patient has responded very well to treatment with Rituxan and bendamustine. We'll continue with chemotherapy as previously dosed. Patient would like to delay the next cycle by 1 week due to a golf tournament, she will return July 19 and 20th.  Patient expressed understanding and was in agreement with this plan. She also understands that She can call clinic at any time with any questions, concerns, or complaints.   Dr. Grayland Ormond was available for consultation and review of plan of care for this patient.  Lymphoma   Staging form: Lymphoid Neoplasms, AJCC 6th Edition     Clinical: No stage assigned - Unsigned   Evlyn Kanner, NP   07/07/2014 10:13 AM

## 2014-07-29 ENCOUNTER — Other Ambulatory Visit: Payer: Self-pay | Admitting: Family Medicine

## 2014-07-30 NOTE — Progress Notes (Signed)
[place]  [phone]) [phone]  [phone]     Jacqueline Webb DOB: Mar 29, 1940  MR#: [id number]  WVP#:[id number]  Patient Care Team: [name], MD as PCP - General (Unknown Physician Specialty) Robert Bellow, MD (General Surgery) Forest Gleason, MD (Oncology)   Patient was seen and evaluated on 07/07/2014.  CHIEF COMPLAINT:  Chief Complaint  Patient presents with  . Follow-up   Patient with history of low-grade lymphoma. Patient was started on bendamustine and Rituxan due to progressive disease in April 2016.  INTERVAL HISTORY:  Overall patient feels very well today and denies any acute complaints. She has occasional nausea that is managed with oral Zofran. This is her second treatment cycle with Rituxan and bendamustine and reports a noticeable reduction in size of lymph nodes. She is requesting to delay her next treatment by 1 week in order to play in a golf tournament.  REVIEW OF SYSTEMS:   Review of Systems  Constitutional: Negative for fever, chills, weight loss, malaise/fatigue and diaphoresis.  HENT: Negative for congestion, ear discharge, ear pain, hearing loss, nosebleeds, sore throat and tinnitus.   Eyes: Negative for blurred vision, double vision, photophobia, pain, discharge and redness.  Respiratory: Negative for cough, hemoptysis, sputum production, shortness of breath, wheezing and stridor.   Cardiovascular: Negative for chest pain, palpitations, orthopnea, claudication, leg swelling and PND.  Gastrointestinal: Negative for heartburn, nausea, vomiting, abdominal pain, diarrhea, constipation, blood in stool and melena.  Genitourinary: Negative.   Musculoskeletal: Negative.   Skin: Negative.   Neurological: Negative for dizziness, tingling, focal weakness, seizures, weakness and headaches.  Endo/Heme/Allergies: Does not bruise/bleed easily.  Psychiatric/Behavioral: Negative for depression. The patient is not  nervous/anxious and does not have insomnia.     As per HPI. Otherwise, a complete review of systems is negatve.  ONCOLOGY HISTORY: Oncology History   1. Lymphoma low-grade, status Rituxan therapy. 2. Recurrent disease by clinical examination in December of 2012 3. Ovarian mass on PET scan in December of 2012 (normal CA 125). Ovarian mass has resolved after rituximab therapy. 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, 2016) 6, patient started on bendamustine and Rituxan because of progressive disease and symptomatic disease (May 11, 2014)     Lymphoma   04/14/2014 Initial Diagnosis Lymphoma    PAST MEDICAL HISTORY: Past Medical History  Diagnosis Date  . Hypertension   . Hypercholesteremia   . Lymphoma 2007  . Benign neoplasm of rectum and anal canal   . Chronic leukemia of unspecified cell type, without mention of having achieved remission     PAST SURGICAL HISTORY: Past Surgical History  Procedure Laterality Date  . Colonoscopy  2009, 2012     hyperplastic rectal polyp 2012 tubular adenoma of proximal ascending colon 2000 9 repeat exam due to 2017.  [name] Flexible sigmoidoscopy  1998  . Cardiac catheterization  2014  . Appendectomy    . Breast surgery Left 1990    biopsy  . Breast surgery Right May 08, 2012    complex fibroadenoma without malignancy.  . Coronary artery bypass graft  1999  . Abdominal hysterectomy  1958    FAMILY HISTORY Family History  Problem Relation Age of Onset  . Cancer Other     breast cancer niece, pancreatic cancer niece  . Lung cancer Father     Died age 19  . Heart disease Mother     Died age 25  .  Asthma Mother   . CAD Brother     CABG age 35  . Drug abuse Daughter   . Diabetes Maternal Uncle   . Early death Paternal 51   . Heart attack Sister   . Tuberculosis Sister     Teen years  . Lung cancer Sister     Died in her 13s  . Heart attack  Sister   . Angina Sister   . Breast cancer Sister   . Rheum arthritis Sister   . Aneurysm Sister     Brain, died age 85  . Angina Sister   . Angina Sister   . Kidney cancer Sister   . Heart disease Brother     Stent placement  . Heart disease Brother     stent placement x 2    GYNECOLOGIC HISTORY:  No LMP recorded. Patient has had a hysterectomy.     ADVANCED DIRECTIVES:    HEALTH MAINTENANCE: History  Substance Use Topics  . Smoking status: Never Smoker   . Smokeless tobacco: Never Used  . Alcohol Use: 1.8 - 2.4 oz/week    3-4 Shots of liquor per week     Colonoscopy:  PAP:  Bone density:  Lipid panel:  No Known Allergies  Current Outpatient Prescriptions  Medication Sig Dispense Refill  . aspirin 81 MG tablet Take 81 mg by mouth daily.    [name] levothyroxine (SYNTHROID, LEVOTHROID) 100 MCG tablet Take 88 mcg by mouth daily before breakfast.     . lisinopril-hydrochlorothiazide (PRINZIDE,ZESTORETIC) 20-12.5 MG per tablet Take 1 tablet by mouth daily.    . Melatonin 1 MG CAPS Take 1 capsule by mouth at bedtime.    . solifenacin (VESICARE) 5 MG tablet Take 5 mg by mouth daily.    . ASPIRIN EC LOW STRENGTH PO Take 1 tablet by mouth 1 day or 1 dose.    . levothyroxine (SYNTHROID, LEVOTHROID) 88 MCG tablet Take 1 tablet by mouth 1 day or 1 dose.    [name] lisinopril-hydrochlorothiazide (PRINZIDE,ZESTORETIC) 20-12.5 MG per tablet Take 1 tablet by mouth 1 day or 1 dose.    . nitrofurantoin, macrocrystal-monohydrate, (MACROBID) 100 MG capsule Take 1 capsule by mouth 2 (two) times daily.    . simvastatin (ZOCOR) 40 MG tablet Take 40 mg by mouth every evening.    . solifenacin (VESICARE) 10 MG tablet Take 1 tablet by mouth 1 day or 1 dose.    . solifenacin (VESICARE) 5 MG tablet Take 1 tablet by mouth 1 day or 1 dose.     No current facility-administered medications for this visit.   Facility-Administered Medications Ordered in Other Visits  Medication Dose Route Frequency  Provider Last Rate Last Dose  . sodium chloride 0.9 % injection 3 mL  3 mL Intravenous PRN Evlyn Kanner, NP        OBJECTIVE: BP 122/74 mmHg  Pulse 70  Temp(Src) 96.6 F (35.9 C) (Tympanic)  Wt 134 lb 14.7 oz (61.2 kg)   Body mass index is 22.45 kg/(m^2).    ECOG FS:0 - Asymptomatic  General: Well-developed, well-nourished, no acute distress. Eyes: Pink conjunctiva, anicteric sclera. HEENT: Normocephalic, moist mucous membranes, clear oropharnyx. Lungs: Clear to auscultation bilaterally. Heart: Regular rate and rhythm. No rubs, murmurs, or gallops. Abdomen: Soft, nontender, nondistended. No organomegaly noted, normoactive bowel sounds. Musculoskeletal: No edema, cyanosis, or clubbing. Neuro: Alert, answering all questions appropriately. Cranial nerves grossly intact. Skin: No rashes or petechiae noted. Psych: Normal affect. Lymphatics: No palpable cervical, calvicular, axillary  or inguinal LAD.   LAB RESULTS:  Appointment on 07/07/2014  Component Date Value Ref Range Status  . WBC 07/07/2014 3.4* 3.6 - 11.0 K/uL Final  . RBC 07/07/2014 4.33  3.80 - 5.20 MIL/uL Final  . Hemoglobin 07/07/2014 12.8  12.0 - 16.0 g/dL Final  . HCT 07/07/2014 38.0  35.0 - 47.0 % Final  . MCV 07/07/2014 87.7  80.0 - 100.0 fL Final  . MCH 07/07/2014 29.6  26.0 - 34.0 pg Final  . MCHC 07/07/2014 33.7  32.0 - 36.0 g/dL Final  . RDW 07/07/2014 14.9* 11.5 - 14.5 % Final  . Platelets 07/07/2014 144* 150 - 440 K/uL Final  . Neutrophils Relative % 07/07/2014 60   Final  . Neutro Abs 07/07/2014 2.0  1.4 - 6.5 K/uL Final  . Lymphocytes Relative 07/07/2014 21   Final  . Lymphs Abs 07/07/2014 0.7* 1.0 - 3.6 K/uL Final  . Monocytes Relative 07/07/2014 12   Final  . Monocytes Absolute 07/07/2014 0.4  0.2 - 0.9 K/uL Final  . Eosinophils Relative 07/07/2014 5   Final  . Eosinophils Absolute 07/07/2014 0.1  0 - 0.7 K/uL Final  . Basophils Relative 07/07/2014 2   Final  . Basophils Absolute 07/07/2014 0.1  0  - 0.1 K/uL Final  . Sodium 07/07/2014 137  135 - 145 mmol/L Final  . Potassium 07/07/2014 3.7  3.5 - 5.1 mmol/L Final  . Chloride 07/07/2014 106  101 - 111 mmol/L Final  . CO2 07/07/2014 27  22 - 32 mmol/L Final  . Glucose, Bld 07/07/2014 123* 65 - 99 mg/dL Final  . BUN 07/07/2014 22* 6 - 20 mg/dL Final  . Creatinine, Ser 07/07/2014 0.93  0.44 - 1.00 mg/dL Final  . Calcium 07/07/2014 9.3  8.9 - 10.3 mg/dL Final  . Total Protein 07/07/2014 6.4* 6.5 - 8.1 g/dL Final  . Albumin 07/07/2014 4.0  3.5 - 5.0 g/dL Final  . AST 07/07/2014 28  15 - 41 U/L Final  . ALT 07/07/2014 28  14 - 54 U/L Final  . Alkaline Phosphatase 07/07/2014 59  38 - 126 U/L Final  . Total Bilirubin 07/07/2014 1.1  0.3 - 1.2 mg/dL Final  . GFR calc non Af Amer 07/07/2014 59* >60 mL/min Final  . GFR calc Af Amer 07/07/2014 >60  >60 mL/min Final   Comment: (NOTE) The eGFR has been calculated using the CKD EPI equation. This calculation has not been validated in all clinical situations. eGFR's persistently <60 mL/min signify possible Chronic Kidney Disease.   . Anion gap 07/07/2014 4* 5 - 15 Final  . Magnesium 07/07/2014 1.6* 1.7 - 2.4 mg/dL Final  . LDH 07/07/2014 197* 98 - 192 U/L Final    STUDIES: No results found.  ASSESSMENT:  Stage IV small lymphocytic lymphoma.  PLAN:  1. Lymphoma. Patient reports grade improvement in overall size of lymph nodes. She also reports most symptoms have resolved. We'll proceed with cycle 3 of bendamustine and Rituxan. Patient has requested to delay next treatment by 1 week. We'll schedule for July 19.  Patient expressed understanding and was in agreement with this plan. She also understands that She can call clinic at any time with any questions, concerns, or complaints.   Dr. Oliva Bustard was available for consultation and review of plan of care for this patient.  Lymphoma   Staging form: Lymphoid Neoplasms, AJCC 6th Edition     Clinical: No stage assigned - Unsigned   Evlyn Kanner, NP  07/07/2014 9:30 AM

## 2014-08-06 ENCOUNTER — Other Ambulatory Visit: Payer: Self-pay | Admitting: *Deleted

## 2014-08-06 DIAGNOSIS — C8590 Non-Hodgkin lymphoma, unspecified, unspecified site: Secondary | ICD-10-CM

## 2014-08-10 ENCOUNTER — Inpatient Hospital Stay: Payer: Medicare Other | Attending: Oncology | Admitting: Oncology

## 2014-08-10 ENCOUNTER — Encounter: Payer: Self-pay | Admitting: Oncology

## 2014-08-10 ENCOUNTER — Inpatient Hospital Stay: Payer: Medicare Other

## 2014-08-10 VITALS — BP 111/72 | HR 79 | Temp 96.2°F | Wt 141.5 lb

## 2014-08-10 DIAGNOSIS — C8590 Non-Hodgkin lymphoma, unspecified, unspecified site: Secondary | ICD-10-CM

## 2014-08-10 DIAGNOSIS — Z90710 Acquired absence of both cervix and uterus: Secondary | ICD-10-CM | POA: Diagnosis not present

## 2014-08-10 DIAGNOSIS — Z86010 Personal history of colonic polyps: Secondary | ICD-10-CM | POA: Diagnosis not present

## 2014-08-10 DIAGNOSIS — I10 Essential (primary) hypertension: Secondary | ICD-10-CM | POA: Diagnosis not present

## 2014-08-10 DIAGNOSIS — Z79899 Other long term (current) drug therapy: Secondary | ICD-10-CM | POA: Insufficient documentation

## 2014-08-10 DIAGNOSIS — Z803 Family history of malignant neoplasm of breast: Secondary | ICD-10-CM | POA: Diagnosis not present

## 2014-08-10 DIAGNOSIS — E780 Pure hypercholesterolemia: Secondary | ICD-10-CM | POA: Diagnosis not present

## 2014-08-10 DIAGNOSIS — Z800 Family history of malignant neoplasm of digestive organs: Secondary | ICD-10-CM | POA: Insufficient documentation

## 2014-08-10 DIAGNOSIS — Z7982 Long term (current) use of aspirin: Secondary | ICD-10-CM | POA: Diagnosis not present

## 2014-08-10 DIAGNOSIS — Z9049 Acquired absence of other specified parts of digestive tract: Secondary | ICD-10-CM | POA: Diagnosis not present

## 2014-08-10 DIAGNOSIS — R5383 Other fatigue: Secondary | ICD-10-CM

## 2014-08-10 DIAGNOSIS — R531 Weakness: Secondary | ICD-10-CM | POA: Diagnosis not present

## 2014-08-10 DIAGNOSIS — Z5111 Encounter for antineoplastic chemotherapy: Secondary | ICD-10-CM | POA: Insufficient documentation

## 2014-08-10 DIAGNOSIS — Z801 Family history of malignant neoplasm of trachea, bronchus and lung: Secondary | ICD-10-CM | POA: Diagnosis not present

## 2014-08-10 LAB — COMPREHENSIVE METABOLIC PANEL
ALBUMIN: 4 g/dL (ref 3.5–5.0)
ALK PHOS: 70 U/L (ref 38–126)
ALT: 24 U/L (ref 14–54)
ANION GAP: 8 (ref 5–15)
AST: 27 U/L (ref 15–41)
BUN: 24 mg/dL — ABNORMAL HIGH (ref 6–20)
CALCIUM: 9.6 mg/dL (ref 8.9–10.3)
CO2: 28 mmol/L (ref 22–32)
CREATININE: 0.94 mg/dL (ref 0.44–1.00)
Chloride: 104 mmol/L (ref 101–111)
GFR, EST NON AFRICAN AMERICAN: 58 mL/min — AB (ref >60)
GLUCOSE: 113 mg/dL — AB (ref 65–99)
POTASSIUM: 3.8 mmol/L (ref 3.5–5.1)
Sodium: 140 mmol/L (ref 135–145)
Total Bilirubin: 1 mg/dL (ref 0.3–1.2)
Total Protein: 6.6 g/dL (ref 6.5–8.1)

## 2014-08-10 LAB — CBC WITH DIFFERENTIAL/PLATELET
Basophils Absolute: 0.1 10*3/uL (ref 0–0.1)
Basophils Relative: 2 %
EOS PCT: 4 %
Eosinophils Absolute: 0.1 10*3/uL (ref 0–0.7)
HCT: 37.1 % (ref 35.0–47.0)
HEMOGLOBIN: 12.6 g/dL (ref 12.0–16.0)
LYMPHS PCT: 18 %
Lymphs Abs: 0.7 10*3/uL — ABNORMAL LOW (ref 1.0–3.6)
MCH: 29.8 pg (ref 26.0–34.0)
MCHC: 34.1 g/dL (ref 32.0–36.0)
MCV: 87.6 fL (ref 80.0–100.0)
MONO ABS: 0.4 10*3/uL (ref 0.2–0.9)
Monocytes Relative: 10 %
Neutro Abs: 2.5 10*3/uL (ref 1.4–6.5)
Neutrophils Relative %: 66 %
PLATELETS: 144 10*3/uL — AB (ref 150–440)
RBC: 4.24 MIL/uL (ref 3.80–5.20)
RDW: 13.8 % (ref 11.5–14.5)
WBC: 3.7 10*3/uL (ref 3.6–11.0)

## 2014-08-10 LAB — MAGNESIUM: MAGNESIUM: 1.7 mg/dL (ref 1.7–2.4)

## 2014-08-10 LAB — LACTATE DEHYDROGENASE: LDH: 172 U/L (ref 98–192)

## 2014-08-10 MED ORDER — SODIUM CHLORIDE 0.9 % IJ SOLN
10.0000 mL | INTRAMUSCULAR | Status: DC | PRN
  Filled 2014-08-10: qty 10

## 2014-08-10 MED ORDER — ACETAMINOPHEN 325 MG PO TABS
650.0000 mg | ORAL_TABLET | Freq: Once | ORAL | Status: AC
  Administered 2014-08-10: 650 mg via ORAL
  Filled 2014-08-10: qty 2

## 2014-08-10 MED ORDER — SODIUM CHLORIDE 0.9 % IV SOLN
80.0000 mg/m2 | Freq: Once | INTRAVENOUS | Status: AC
  Administered 2014-08-10: 150 mg via INTRAVENOUS
  Filled 2014-08-10: qty 6

## 2014-08-10 MED ORDER — SODIUM CHLORIDE 0.9 % IV SOLN
375.0000 mg/m2 | Freq: Once | INTRAVENOUS | Status: AC
  Administered 2014-08-10: 600 mg via INTRAVENOUS
  Filled 2014-08-10: qty 50

## 2014-08-10 MED ORDER — SODIUM CHLORIDE 0.9 % IV SOLN
Freq: Once | INTRAVENOUS | Status: AC
  Administered 2014-08-10: 10:00:00 via INTRAVENOUS
  Filled 2014-08-10: qty 1000

## 2014-08-10 MED ORDER — SODIUM CHLORIDE 0.9 % IV SOLN: 375.0000 mg/m2 | Freq: Once | INTRAVENOUS | Status: DC

## 2014-08-10 MED ORDER — SODIUM CHLORIDE 0.9 % IV SOLN
Freq: Once | INTRAVENOUS | Status: AC
  Administered 2014-08-10: 11:00:00 via INTRAVENOUS
  Filled 2014-08-10: qty 4

## 2014-08-10 MED ORDER — DIPHENHYDRAMINE HCL 25 MG PO CAPS
50.0000 mg | ORAL_CAPSULE | Freq: Once | ORAL | Status: AC
  Administered 2014-08-10: 50 mg via ORAL
  Filled 2014-08-10: qty 2

## 2014-08-10 NOTE — Progress Notes (Signed)
Patient does not have living will.  Never smoked. 

## 2014-08-10 NOTE — Progress Notes (Signed)
[place] @ [facility] [phone]) [phone]  Fax:(336) [place] OB: [birth year]  MR#: [id number]  IPJ#:[id number]  Patient Care Team: [name], MD as PCP - General (Unknown Physician Specialty) Robert Bellow, MD (General Surgery) Forest Gleason, MD (Oncology)  CHIEF COMPLAINT:  Chief Complaint  Patient presents with  . Follow-up    Oncology History   1. Lymphoma low-grade, status Rituxan therapy. 2. Recurrent disease by clinical examination in December of 2012 3. Ovarian mass on PET scan in December of 2012 (normal CA 125). Ovarian mass has resolved after rituximab therapy. 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, 2016) 6, patient started on bendamustine and Rituxan because of progressive disease and symptomatic disease (May 11, 2014)     Lymphoma   04/14/2014 Initial Diagnosis Lymphoma    Oncology Flowsheet 06/08/2014 06/09/2014 07/07/2014 07/08/2014 08/10/2014  Day, Cycle 2, Cycle 1 Day 2, Cycle 1 Day 1, Cycle 2 Day 2, Cycle 2 Day 1, Cycle 3  bendamustine (BENDEKA) IV - - 80 mg/m2 80 mg/m2 -  bendamustine (TREANDA) IV 80 mg/m2 144 mg - - -  dexamethasone (DECADRON) IV [ 10 mg ] [ 10 mg ] [ 10 mg ] [ 10 mg ] [ 10 mg ]  diphenhydrAMINE (BENADRYL) PO 50 mg - 50 mg - 50 mg  ondansetron (ZOFRAN) IV [ 8 mg ] [ 8 mg ] [ 8 mg ] [ 8 mg ] [ 8 mg ]  pegfilgrastim (NEULASTA ONPRO KIT) [place] - 6 mg - 6 mg -  riTUXimab (RITUXAN) IV 375 mg/m2 - 700 mg - 375 mg/m2    INTERVAL HISTORY:  74 year old lady with a history of low-grade lymphoma.  Patient had 3 cycles of chemotherapy with significant response however last chemotherapy patient felt weak and tired.  Has some generalized aches and pains.  No nausea no vomiting.  Patient is here for continuation of 4 cycles of chemotherapy with bendamustine and Rituxan. REVIEW OF SYSTEMS:   GENERAL:  Feels good.  Active.  No fevers,  sweats or weight loss. He should not at some generalized pain lasting for 3 days after last chemotherapy PERFORMANCE STATUS (ECOG):0 HEENT:  No visual changes, runny nose, sore throat, mouth sores or tenderness. Lungs: No shortness of breath or cough.  No hemoptysis. Cardiac:  No chest pain, palpitations, orthopnea, or PND. GI:  No nausea, vomiting, diarrhea, constipation, melena or hematochezia. GU:  No urgency, frequency, dysuria, or hematuria. Musculoskeletal:  No back pain.  No joint pain.  No muscle tenderness. Extremities:  No pain or swelling. Skin:  No rashes or skin changes. Neuro:  No headache, numbness or weakness, balance or coordination issues. Endocrine:  No diabetes, thyroid issues, hot flashes or night sweats. Psych:  No mood changes, depression or anxiety. Pain:  No focal pain. Review of systems:  All other systems reviewed and found to be negative.  As per HPI. Otherwise, a complete review of systems is negatve.  PAST MEDICAL HISTORY: Past Medical History  Diagnosis Date  . Hypertension   . Hypercholesteremia   . Lymphoma 2007  . Benign neoplasm of rectum and anal canal   . Chronic leukemia of unspecified cell type, without mention of having achieved remission     PAST SURGICAL HISTORY: Past Surgical History  Procedure Laterality Date  . Colonoscopy  2009, 2012     hyperplastic rectal polyp 2012 tubular adenoma of proximal ascending  colon 2000 9 repeat exam due to 2017.  [name] Flexible sigmoidoscopy  1998  . Cardiac catheterization  2014  . Appendectomy    . Breast surgery Left 1990    biopsy  . Breast surgery Right May 08, 2012    complex fibroadenoma without malignancy.  . Coronary artery bypass graft  1999  . Abdominal hysterectomy  1958    FAMILY HISTORY Family History  Problem Relation Age of Onset  . Cancer Other     breast cancer niece, pancreatic cancer niece  . Lung cancer Father     Died age 29  . Heart disease Mother     Died age 15  .  Asthma Mother   . CAD Brother     CABG age 75  . Drug abuse Daughter   . Diabetes Maternal Uncle   . Early death Paternal 36   . Heart attack Sister   . Tuberculosis Sister     Teen years  . Lung cancer Sister     Died in her 47s  . Heart attack Sister   . Angina Sister   . Breast cancer Sister   . Rheum arthritis Sister   . Aneurysm Sister     Brain, died age 29  . Angina Sister   . Angina Sister   . Kidney cancer Sister   . Heart disease Brother     Stent placement  . Heart disease Brother     stent placement x 2    ADVANCED DIRECTIVES:  No flowsheet data found.  HEALTH MAINTENANCE: History  Substance Use Topics  . Smoking status: Never Smoker   . Smokeless tobacco: Never Used  . Alcohol Use: 1.8 - 2.4 oz/week    3-4 Shots of liquor per week      No Known Allergies  Current Outpatient Prescriptions  Medication Sig Dispense Refill  . aspirin 81 MG tablet Take 81 mg by mouth daily.    . ASPIRIN EC LOW STRENGTH PO Take 1 tablet by mouth 1 day or 1 dose.    . levothyroxine (SYNTHROID, LEVOTHROID) 100 MCG tablet Take 88 mcg by mouth daily before breakfast.     . levothyroxine (SYNTHROID, LEVOTHROID) 88 MCG tablet Take 1 tablet by mouth 1 day or 1 dose.    [name] lisinopril-hydrochlorothiazide (PRINZIDE,ZESTORETIC) 20-12.5 MG per tablet Take 1 tablet by mouth daily.    [name] lisinopril-hydrochlorothiazide (PRINZIDE,ZESTORETIC) 20-12.5 MG per tablet Take 1 tablet by mouth 1 day or 1 dose.    . Melatonin 1 MG CAPS Take 1 capsule by mouth at bedtime.    . nitrofurantoin, macrocrystal-monohydrate, (MACROBID) 100 MG capsule Take 1 capsule by mouth 2 (two) times daily.    . simvastatin (ZOCOR) 40 MG tablet Take 40 mg by mouth every evening.    . solifenacin (VESICARE) 10 MG tablet Take 1 tablet by mouth 1 day or 1 dose.    . solifenacin (VESICARE) 5 MG tablet Take 5 mg by mouth daily.    . solifenacin (VESICARE) 5 MG tablet Take 1 tablet by mouth 1 day or 1 dose.     No current  facility-administered medications for this visit.   Facility-Administered Medications Ordered in Other Visits  Medication Dose Route Frequency Provider Last Rate Last Dose  . bendamustine (BENDEKA) 150 mg in sodium chloride 0.9 % 50 mL (2.6786 mg/mL) chemo infusion  80 mg/m2 (Treatment Plan Actual) Intravenous Once Forest Gleason, MD      . sodium chloride 0.9 % injection 10 mL  10 mL Intracatheter PRN Forest Gleason, MD        OBJECTIVE:  Filed Vitals:   08/10/14 0916  BP: 111/72  Pulse: 79  Temp: 96.2 F (35.7 C)     Body mass index is 23.55 kg/(m^2).    ECOG FS:0 - Asymptomatic  PHYSICAL EXAM: GENERAL:  Well developed, well nourished, sitting comfortably in the exam room in no acute distress. MENTAL STATUS:  Alert and oriented to person, place and time. HEAD:    Normocephalic, atraumatic, face symmetric, no Cushingoid features. EYES:  .  Pupils equal round and reactive to light and accomodation.  No conjunctivitis or scleral icterus. ENT:  Oropharynx clear without lesion.  Tongue normal. Mucous membranes moist.  RESPIRATORY:  Clear to auscultation without rales, wheezes or rhonchi. CARDIOVASCULAR:  Regular rate and rhythm without murmur, rub or gallop. BREAST:  Right breast without masses, skin changes or nipple discharge.  Left breast without masses, skin changes or nipple discharge. ABDOMEN:  Soft, non-tender, with active bowel sounds, and no hepatosplenomegaly.  No masses. BACK:  No CVA tenderness.  No tenderness on percussion of the back or rib cage. SKIN:  No rashes, ulcers or lesions. EXTREMITIES: No edema, no skin discoloration or tenderness.  No palpable cords. LYMPH NODES: All palpable lymph node has resolved . NEUROLOGICAL: Unremarkable. PSYCH:  Appropriate.  LAB RESULTS:  Appointment on 08/10/2014  Component Date Value Ref Range Status  . WBC 08/10/2014 3.7  3.6 - 11.0 K/uL Final  . RBC 08/10/2014 4.24  3.80 - 5.20 MIL/uL Final  . Hemoglobin 08/10/2014 12.6  12.0 -  16.0 g/dL Final  . HCT 08/10/2014 37.1  35.0 - 47.0 % Final  . MCV 08/10/2014 87.6  80.0 - 100.0 fL Final  . MCH 08/10/2014 29.8  26.0 - 34.0 pg Final  . MCHC 08/10/2014 34.1  32.0 - 36.0 g/dL Final  . RDW 08/10/2014 13.8  11.5 - 14.5 % Final  . Platelets 08/10/2014 144* 150 - 440 K/uL Final  . Neutrophils Relative % 08/10/2014 66   Final  . Neutro Abs 08/10/2014 2.5  1.4 - 6.5 K/uL Final  . Lymphocytes Relative 08/10/2014 18   Final  . Lymphs Abs 08/10/2014 0.7* 1.0 - 3.6 K/uL Final  . Monocytes Relative 08/10/2014 10   Final  . Monocytes Absolute 08/10/2014 0.4  0.2 - 0.9 K/uL Final  . Eosinophils Relative 08/10/2014 4   Final  . Eosinophils Absolute 08/10/2014 0.1  0 - 0.7 K/uL Final  . Basophils Relative 08/10/2014 2   Final  . Basophils Absolute 08/10/2014 0.1  0 - 0.1 K/uL Final  . Sodium 08/10/2014 140  135 - 145 mmol/L Final  . Potassium 08/10/2014 3.8  3.5 - 5.1 mmol/L Final  . Chloride 08/10/2014 104  101 - 111 mmol/L Final  . CO2 08/10/2014 28  22 - 32 mmol/L Final  . Glucose, Bld 08/10/2014 113* 65 - 99 mg/dL Final  . BUN 08/10/2014 24* 6 - 20 mg/dL Final  . Creatinine, Ser 08/10/2014 0.94  0.44 - 1.00 mg/dL Final  . Calcium 08/10/2014 9.6  8.9 - 10.3 mg/dL Final  . Total Protein 08/10/2014 6.6  6.5 - 8.1 g/dL Final  . Albumin 08/10/2014 4.0  3.5 - 5.0 g/dL Final  . AST 08/10/2014 27  15 - 41 U/L Final  . ALT 08/10/2014 24  14 - 54 U/L Final  . Alkaline Phosphatase 08/10/2014 70  38 - 126 U/L Final  . Total Bilirubin 08/10/2014 1.0  0.3 -  1.2 mg/dL Final  . GFR calc non Af Amer 08/10/2014 58* >60 mL/min Final  . GFR calc Af Amer 08/10/2014 >60  >60 mL/min Final   Comment: (NOTE) The eGFR has been calculated using the CKD EPI equation. This calculation has not been validated in all clinical situations. eGFR's persistently <60 mL/min signify possible Chronic Kidney Disease.   . Anion gap 08/10/2014 8  5 - 15 Final  . LDH 08/10/2014 172  98 - 192 U/L Final  .  Magnesium 08/10/2014 1.7  1.7 - 2.4 mg/dL Final      ASSESSMENT: Low-grade lymphoma at present time on bendamustine and Rituxan with excellent response. Patient has significant side effect with feeling weak and tired lasting for 4 days after last chemotherapy  MEDICAL DECISION MAKING:  All lab data has been reviewed.  Patient was advised to keep herself hydrated.  Will continue same chemotherapy.  After this chemotherapy treatment a PET scan can be arranged.  The patient is in complete remission chemotherapy can be discontinued. Duration on maintenance chemotherapy because of bulky disease (Rituxan every 2 months) can be considered. Patient expressed understanding and was in agreement with this plan. She also understands that She can call clinic at any time with any questions, concerns, or complaints.    Lymphoma   Staging form: Lymphoid Neoplasms, AJCC 6th Edition     Clinical: No stage assigned - Marni Griffon, MD   08/10/2014 11:56 AM

## 2014-08-11 ENCOUNTER — Ambulatory Visit: Payer: Medicare Other | Admitting: Oncology

## 2014-08-11 ENCOUNTER — Other Ambulatory Visit: Payer: Medicare Other

## 2014-08-11 ENCOUNTER — Ambulatory Visit: Payer: Medicare Other

## 2014-08-11 ENCOUNTER — Inpatient Hospital Stay: Payer: Medicare Other

## 2014-08-11 VITALS — BP 109/56 | HR 98 | Temp 97.2°F | Resp 18

## 2014-08-11 DIAGNOSIS — R531 Weakness: Secondary | ICD-10-CM | POA: Diagnosis not present

## 2014-08-11 DIAGNOSIS — Z5111 Encounter for antineoplastic chemotherapy: Secondary | ICD-10-CM | POA: Diagnosis not present

## 2014-08-11 DIAGNOSIS — C8590 Non-Hodgkin lymphoma, unspecified, unspecified site: Secondary | ICD-10-CM

## 2014-08-11 DIAGNOSIS — R5383 Other fatigue: Secondary | ICD-10-CM | POA: Diagnosis not present

## 2014-08-11 DIAGNOSIS — Z79899 Other long term (current) drug therapy: Secondary | ICD-10-CM | POA: Diagnosis not present

## 2014-08-11 DIAGNOSIS — Z7982 Long term (current) use of aspirin: Secondary | ICD-10-CM | POA: Diagnosis not present

## 2014-08-11 MED ORDER — SODIUM CHLORIDE 0.9 % IV SOLN
Freq: Once | INTRAVENOUS | Status: AC
  Administered 2014-08-11: 15:00:00 via INTRAVENOUS
  Filled 2014-08-11: qty 4

## 2014-08-11 MED ORDER — PEGFILGRASTIM 6 MG/0.6ML ~~LOC~~ PSKT
6.0000 mg | PREFILLED_SYRINGE | Freq: Once | SUBCUTANEOUS | Status: AC
  Administered 2014-08-11: 6 mg via SUBCUTANEOUS
  Filled 2014-08-11: qty 0.6

## 2014-08-11 MED ORDER — SODIUM CHLORIDE 0.9 % IV SOLN
Freq: Once | INTRAVENOUS | Status: AC
  Administered 2014-08-11: 14:00:00 via INTRAVENOUS
  Filled 2014-08-11: qty 1000

## 2014-08-11 MED ORDER — SODIUM CHLORIDE 0.9 % IV SOLN
80.0000 mg/m2 | Freq: Once | INTRAVENOUS | Status: AC
  Administered 2014-08-11: 150 mg via INTRAVENOUS
  Filled 2014-08-11: qty 6

## 2014-08-12 ENCOUNTER — Ambulatory Visit: Payer: Medicare Other

## 2014-08-23 ENCOUNTER — Telehealth: Payer: Self-pay | Admitting: Family Medicine

## 2014-08-23 NOTE — Telephone Encounter (Signed)
Pt thinks she has a UTI and asked that Dr. [name] call something in for her. I advised pt that we can not treat over the phone. Pt request that a nurse call her back. Pt stated that she has increase frequency urinating, pressure, & some blood in her urine. Pt stated that she lives 2 hours away and coming in isn't an option for her. Thanks TNP

## 2014-08-23 NOTE — Telephone Encounter (Signed)
Patient has been advised that she will need to be seen.  Have also given her the option of an urgent care since she lives so far away and feels coming in is not an option.  ED

## 2014-09-07 ENCOUNTER — Encounter (INDEPENDENT_AMBULATORY_CARE_PROVIDER_SITE_OTHER): Payer: Self-pay

## 2014-09-07 ENCOUNTER — Inpatient Hospital Stay: Payer: Medicare Other

## 2014-09-07 ENCOUNTER — Inpatient Hospital Stay: Payer: Medicare Other | Attending: Oncology | Admitting: Oncology

## 2014-09-07 ENCOUNTER — Encounter: Payer: Self-pay | Admitting: Oncology

## 2014-09-07 VITALS — BP 109/68 | HR 81 | Temp 96.4°F | Resp 18 | Wt 139.4 lb

## 2014-09-07 DIAGNOSIS — Z7982 Long term (current) use of aspirin: Secondary | ICD-10-CM

## 2014-09-07 DIAGNOSIS — E780 Pure hypercholesterolemia: Secondary | ICD-10-CM | POA: Diagnosis not present

## 2014-09-07 DIAGNOSIS — R531 Weakness: Secondary | ICD-10-CM | POA: Diagnosis not present

## 2014-09-07 DIAGNOSIS — Z800 Family history of malignant neoplasm of digestive organs: Secondary | ICD-10-CM

## 2014-09-07 DIAGNOSIS — Z803 Family history of malignant neoplasm of breast: Secondary | ICD-10-CM | POA: Diagnosis not present

## 2014-09-07 DIAGNOSIS — R5383 Other fatigue: Secondary | ICD-10-CM | POA: Diagnosis not present

## 2014-09-07 DIAGNOSIS — Z79899 Other long term (current) drug therapy: Secondary | ICD-10-CM

## 2014-09-07 DIAGNOSIS — I10 Essential (primary) hypertension: Secondary | ICD-10-CM

## 2014-09-07 DIAGNOSIS — Z801 Family history of malignant neoplasm of trachea, bronchus and lung: Secondary | ICD-10-CM

## 2014-09-07 DIAGNOSIS — C8590 Non-Hodgkin lymphoma, unspecified, unspecified site: Secondary | ICD-10-CM

## 2014-09-07 DIAGNOSIS — Z86010 Personal history of colonic polyps: Secondary | ICD-10-CM | POA: Diagnosis not present

## 2014-09-07 DIAGNOSIS — Z9221 Personal history of antineoplastic chemotherapy: Secondary | ICD-10-CM | POA: Diagnosis not present

## 2014-09-07 LAB — COMPREHENSIVE METABOLIC PANEL
ALK PHOS: 65 U/L (ref 38–126)
ALT: 25 U/L (ref 14–54)
ANION GAP: 6 (ref 5–15)
AST: 24 U/L (ref 15–41)
Albumin: 4 g/dL (ref 3.5–5.0)
BILIRUBIN TOTAL: 0.8 mg/dL (ref 0.3–1.2)
BUN: 30 mg/dL — ABNORMAL HIGH (ref 6–20)
CALCIUM: 9.3 mg/dL (ref 8.9–10.3)
CO2: 28 mmol/L (ref 22–32)
Chloride: 106 mmol/L (ref 101–111)
Creatinine, Ser: 0.99 mg/dL (ref 0.44–1.00)
GFR calc Af Amer: >60 mL/min (ref >60)
GFR calc non Af Amer: 54 mL/min — ABNORMAL LOW (ref >60)
GLUCOSE: 90 mg/dL (ref 65–99)
Potassium: 3.8 mmol/L (ref 3.5–5.1)
SODIUM: 140 mmol/L (ref 135–145)
TOTAL PROTEIN: 6.6 g/dL (ref 6.5–8.1)

## 2014-09-07 LAB — CBC WITH DIFFERENTIAL/PLATELET
BASOS ABS: 0.1 10*3/uL (ref 0–0.1)
BASOS PCT: 2 %
EOS ABS: 0.1 10*3/uL (ref 0–0.7)
Eosinophils Relative: 5 %
HEMATOCRIT: 35.5 % (ref 35.0–47.0)
HEMOGLOBIN: 12.2 g/dL (ref 12.0–16.0)
Lymphocytes Relative: 19 %
Lymphs Abs: 0.6 10*3/uL — ABNORMAL LOW (ref 1.0–3.6)
MCH: 29.8 pg (ref 26.0–34.0)
MCHC: 34.3 g/dL (ref 32.0–36.0)
MCV: 86.8 fL (ref 80.0–100.0)
Monocytes Absolute: 0.4 10*3/uL (ref 0.2–0.9)
Monocytes Relative: 13 %
NEUTROS ABS: 1.8 10*3/uL (ref 1.4–6.5)
NEUTROS PCT: 61 %
Platelets: 143 10*3/uL — ABNORMAL LOW (ref 150–440)
RBC: 4.09 MIL/uL (ref 3.80–5.20)
RDW: 13.6 % (ref 11.5–14.5)
WBC: 3 10*3/uL — AB (ref 3.6–11.0)

## 2014-09-07 LAB — MAGNESIUM: MAGNESIUM: 1.6 mg/dL — AB (ref 1.7–2.4)

## 2014-09-07 NOTE — Progress Notes (Signed)
Patient does not have living will.  Never smoked. Patient c/o weakness in her legs. Patient had UTI 2 weeks ago.  Took OTC AZO.

## 2014-09-07 NOTE — Progress Notes (Signed)
[place] @ [facility] [phone]) [phone]  Fax:(336) [place] OB: 1940-06-14  MR#: [id number]  XQJ#:[id number]  Patient Care Team: [name], MD as PCP - General (Unknown Physician Specialty) Robert Bellow, MD (General Surgery) Forest Gleason, MD (Oncology)  CHIEF COMPLAINT:  Chief Complaint  Patient presents with  . Follow-up    Oncology History   1. Lymphoma low-grade, status Rituxan therapy. 2. Recurrent disease by clinical examination in December of 2012 3. Ovarian mass on PET scan in December of 2012 (normal CA 125). Ovarian mass has resolved after rituximab therapy. 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  Patient had total 5 cycles of chemotherapy with bendamustine and rituximab and 6 cycle was omitted because of significant side effects with and weakness and fatigue.  I asked treatment was in July of 2016     Lymphoma   04/14/2014 Initial Diagnosis Lymphoma    Oncology Flowsheet 06/08/2014 06/09/2014 07/07/2014 07/08/2014 08/10/2014 08/11/2014  Day, Cycle 2, Cycle 1 Day 2, Cycle 1 Day 1, Cycle 2 Day 2, Cycle 2 Day 1, Cycle 3 Day 2, Cycle 3  bendamustine (BENDEKA) IV - - 80 mg/m2 80 mg/m2 80 mg/m2 80 mg/m2  bendamustine (TREANDA) IV 80 mg/m2 144 mg - - - -  dexamethasone (DECADRON) IV [ 10 mg ] [ 10 mg ] [ 10 mg ] [ 10 mg ] [ 10 mg ] [ 10 mg ]  diphenhydrAMINE (BENADRYL) PO 50 mg - 50 mg - 50 mg -  ondansetron (ZOFRAN) IV [ 8 mg ] [ 8 mg ] [ 8 mg ] [ 8 mg ] [ 8 mg ] [ 8 mg ]  pegfilgrastim (NEULASTA ONPRO KIT) [place] - 6 mg - 6 mg - 6 mg  riTUXimab (RITUXAN) IV 375 mg/m2 - 700 mg - 375 mg/m2 -    INTERVAL HISTORY:  74 year old lady with a history of low-grade lymphoma.  Patient had 3 cycles of chemotherapy with significant  response however last chemotherapy patient felt weak and tired.  Has some generalized aches and pains.  No nausea no vomiting.  Patient is here for continuation of 4 cycles of chemotherapy with bendamustine and Rituxan. September 07, 2014 Patient is here for ongoing evaluation and treatment consideration.  Patient is gradually recovering but weakness and fatigue still persists.  No chills.  No fever.  Appetite is improved.  No nausea or vomiting. Here for further follow-up and treatment consideration REVIEW OF SYSTEMS:   GENERAL:  Feels good.  Active.  No fevers, sweats or weight loss. Fatigue grade 2 He should not at some generalized pain lasting for 3 days after last chemotherapy PERFORMANCE STATUS (ECOG):0 HEENT:  No visual changes, runny nose, sore throat, mouth sores or tenderness. Lungs: No shortness of breath or cough.  No hemoptysis. Cardiac:  No chest pain, palpitations, orthopnea, or PND. GI:  No nausea, vomiting, diarrhea, constipation, melena or hematochezia. GU:  No urgency, frequency, dysuria, or hematuria. Musculoskeletal:  No back pain.  No joint pain.  No muscle tenderness. Extremities:  No pain or swelling. Skin:  No rashes or skin changes. Neuro:  No headache, numbness or weakness, balance or coordination issues. Endocrine:  No diabetes, thyroid issues, hot flashes or night sweats. Psych:  No mood changes, depression or anxiety. Pain:  No  focal pain. Review of systems:  All other systems reviewed and found to be negative.  As per HPI. Otherwise, a complete review of systems is negatve.  PAST MEDICAL HISTORY: Past Medical History  Diagnosis Date  . Hypertension   . Hypercholesteremia   . Lymphoma 2007  . Benign neoplasm of rectum and anal canal   . Chronic leukemia of unspecified cell type, without mention of having achieved remission     PAST SURGICAL HISTORY: Past Surgical History  Procedure Laterality Date  . Colonoscopy  2009, 2012     hyperplastic rectal  polyp 2012 tubular adenoma of proximal ascending colon 2000 9 repeat exam due to 2017.  [name] Flexible sigmoidoscopy  1998  . Cardiac catheterization  2014  . Appendectomy    . Breast surgery Left 1990    biopsy  . Breast surgery Right May 08, 2012    complex fibroadenoma without malignancy.  . Coronary artery bypass graft  1999  . Abdominal hysterectomy  1958    FAMILY HISTORY Family History  Problem Relation Age of Onset  . Cancer Other     breast cancer niece, pancreatic cancer niece  . Lung cancer Father     Died age 48  . Heart disease Mother     Died age 68  . Asthma Mother   . CAD Brother     CABG age 95  . Drug abuse Daughter   . Diabetes Maternal Uncle   . Early death Paternal 31   . Heart attack Sister   . Tuberculosis Sister     Teen years  . Lung cancer Sister     Died in her 90s  . Heart attack Sister   . Angina Sister   . Breast cancer Sister   . Rheum arthritis Sister   . Aneurysm Sister     Brain, died age 76  . Angina Sister   . Angina Sister   . Kidney cancer Sister   . Heart disease Brother     Stent placement  . Heart disease Brother     stent placement x 2    ADVANCED DIRECTIVES:  No flowsheet data found.  HEALTH MAINTENANCE: Social History  Substance Use Topics  . Smoking status: Never Smoker   . Smokeless tobacco: Never Used  . Alcohol Use: 1.8 - 2.4 oz/week    3-4 Shots of liquor per week      No Known Allergies  Current Outpatient Prescriptions  Medication Sig Dispense Refill  . aspirin 81 MG tablet Take 81 mg by mouth daily.    . ASPIRIN EC LOW STRENGTH PO Take 1 tablet by mouth 1 day or 1 dose.    . levothyroxine (SYNTHROID, LEVOTHROID) 100 MCG tablet Take 88 mcg by mouth daily before breakfast.     . levothyroxine (SYNTHROID, LEVOTHROID) 88 MCG tablet Take 1 tablet by mouth 1 day or 1 dose.    [name] lisinopril-hydrochlorothiazide (PRINZIDE,ZESTORETIC) 20-12.5 MG per tablet Take 1 tablet by mouth daily.    [name]  lisinopril-hydrochlorothiazide (PRINZIDE,ZESTORETIC) 20-12.5 MG per tablet Take 1 tablet by mouth 1 day or 1 dose.    . Melatonin 1 MG CAPS Take 1 capsule by mouth at bedtime.    . nitrofurantoin, macrocrystal-monohydrate, (MACROBID) 100 MG capsule Take 1 capsule by mouth 2 (two) times daily.    . simvastatin (ZOCOR) 40 MG tablet Take 40 mg by mouth every evening.    . solifenacin (VESICARE) 10 MG tablet Take 1 tablet by mouth 1 day  or 1 dose.    . solifenacin (VESICARE) 5 MG tablet Take 5 mg by mouth daily.    . solifenacin (VESICARE) 5 MG tablet Take 1 tablet by mouth 1 day or 1 dose.    . ondansetron (ZOFRAN) 4 MG tablet      No current facility-administered medications for this visit.    OBJECTIVE:  Filed Vitals:   09/07/14 1037  BP: 109/68  Pulse: 81  Temp: 96.4 F (35.8 C)  Resp: 18     Body mass index is 23.19 kg/(m^2).    ECOG FS:0 - Asymptomatic  PHYSICAL EXAM: GENERAL:  Well developed, well nourished, sitting comfortably in the exam room in no acute distress. MENTAL STATUS:  Alert and oriented to person, place and time. HEAD:    Normocephalic, atraumatic, face symmetric, no Cushingoid features. EYES:  .  Pupils equal round and reactive to light and accomodation.  No conjunctivitis or scleral icterus. ENT:  Oropharynx clear without lesion.  Tongue normal. Mucous membranes moist.  RESPIRATORY:  Clear to auscultation without rales, wheezes or rhonchi. CARDIOVASCULAR:  Regular rate and rhythm without murmur, rub or gallop. BREAST:  Right breast without masses, skin changes or nipple discharge.  Left breast without masses, skin changes or nipple discharge. ABDOMEN:  Soft, non-tender, with active bowel sounds, and no hepatosplenomegaly.  No masses. BACK:  No CVA tenderness.  No tenderness on percussion of the back or rib cage. SKIN:  No rashes, ulcers or lesions. EXTREMITIES: No edema, no skin discoloration or tenderness.  No palpable cords. LYMPH NODES: All palpable lymph  node has resolved . NEUROLOGICAL: Unremarkable. PSYCH:  Appropriate.  LAB RESULTS:  Appointment on 09/07/2014  Component Date Value Ref Range Status  . WBC 09/07/2014 3.0* 3.6 - 11.0 K/uL Final  . RBC 09/07/2014 4.09  3.80 - 5.20 MIL/uL Final  . Hemoglobin 09/07/2014 12.2  12.0 - 16.0 g/dL Final  . HCT 09/07/2014 35.5  35.0 - 47.0 % Final  . MCV 09/07/2014 86.8  80.0 - 100.0 fL Final  . MCH 09/07/2014 29.8  26.0 - 34.0 pg Final  . MCHC 09/07/2014 34.3  32.0 - 36.0 g/dL Final  . RDW 09/07/2014 13.6  11.5 - 14.5 % Final  . Platelets 09/07/2014 143* 150 - 440 K/uL Final  . Neutrophils Relative % 09/07/2014 61   Final  . Neutro Abs 09/07/2014 1.8  1.4 - 6.5 K/uL Final  . Lymphocytes Relative 09/07/2014 19   Final  . Lymphs Abs 09/07/2014 0.6* 1.0 - 3.6 K/uL Final  . Monocytes Relative 09/07/2014 13   Final  . Monocytes Absolute 09/07/2014 0.4  0.2 - 0.9 K/uL Final  . Eosinophils Relative 09/07/2014 5   Final  . Eosinophils Absolute 09/07/2014 0.1  0 - 0.7 K/uL Final  . Basophils Relative 09/07/2014 2   Final  . Basophils Absolute 09/07/2014 0.1  0 - 0.1 K/uL Final  . Sodium 09/07/2014 140  135 - 145 mmol/L Final  . Potassium 09/07/2014 3.8  3.5 - 5.1 mmol/L Final  . Chloride 09/07/2014 106  101 - 111 mmol/L Final  . CO2 09/07/2014 28  22 - 32 mmol/L Final  . Glucose, Bld 09/07/2014 90  65 - 99 mg/dL Final  . BUN 09/07/2014 30* 6 - 20 mg/dL Final  . Creatinine, Ser 09/07/2014 0.99  0.44 - 1.00 mg/dL Final  . Calcium 09/07/2014 9.3  8.9 - 10.3 mg/dL Final  . Total Protein 09/07/2014 6.6  6.5 - 8.1 g/dL Final  . Albumin  09/07/2014 4.0  3.5 - 5.0 g/dL Final  . AST 09/07/2014 24  15 - 41 U/L Final  . ALT 09/07/2014 25  14 - 54 U/L Final  . Alkaline Phosphatase 09/07/2014 65  38 - 126 U/L Final  . Total Bilirubin 09/07/2014 0.8  0.3 - 1.2 mg/dL Final  . GFR calc non Af Amer 09/07/2014 54* >60 mL/min Final  . GFR calc Af Amer 09/07/2014 >60  >60 mL/min Final  . Anion gap 09/07/2014 6   5 - 15 Final  . Magnesium 09/07/2014 1.6* 1.7 - 2.4 mg/dL Final      ASSESSMENT: Low-grade lymphoma patient has responded very well to bendamustine and Rituxan. At present time clinically patient is in complete remission   MEDICAL DECISION MAKING:  All lab data has been reviewed.  The anemia is improved.  Fatigue grade 2 hopefully will gradually improve.  We will reevaluate patient in 2 months with PET scan or before the patient feels any palpable lymph node or if there is no significant improvement in the fatigue or weakness.    Lymphoma   Staging form: Lymphoid Neoplasms, AJCC 6th Edition     Clinical: No stage assigned - Marni Griffon, MD   09/07/2014 8:07 PM

## 2014-09-28 ENCOUNTER — Encounter: Payer: Self-pay | Admitting: Family Medicine

## 2014-09-28 ENCOUNTER — Ambulatory Visit (INDEPENDENT_AMBULATORY_CARE_PROVIDER_SITE_OTHER): Payer: Medicare Other | Admitting: Family Medicine

## 2014-09-28 VITALS — BP 114/62 | HR 84 | Temp 97.5°F | Resp 18 | Wt 142.0 lb

## 2014-09-28 DIAGNOSIS — N390 Urinary tract infection, site not specified: Secondary | ICD-10-CM

## 2014-09-28 MED ORDER — NITROFURANTOIN MONOHYD MACRO 100 MG PO CAPS: 100.0000 mg | ORAL_CAPSULE | Freq: Two times a day (BID) | ORAL | Status: DC

## 2014-09-28 NOTE — Progress Notes (Signed)
Patient ID: Jacqueline Webb, female   DOB: 04-Oct-1940, 74 y.o.   MRN: [id number]    Subjective:  HPI  Patient is here for an acute issue. Patient states about 4 weeks ago she had UTI-blood in urine, frequency, urgency, pressure and she took OTC medication. Symptoms improved gradually for the next 2 weeks or so. Last week September 1 st she started to have pressure again in pelvic area, frequency, urgency to use the bathroom but no blood in urine this time and not as severe as last time. She takes Vesicare 5 mg daily and states she read this could cause UTIs. She is not sexually active and is not sure why she is getting this again. Last Urine culture in March was done but it was negative.   Prior to Admission medications   Medication Sig Start Date End Date Taking? Authorizing Provider  ASPIRIN EC LOW STRENGTH PO Take 1 tablet by mouth 1 day or 1 dose. 07/13/10  Yes Historical Provider, MD  levothyroxine (SYNTHROID, LEVOTHROID) 88 MCG tablet Take 1 tablet by mouth 1 day or 1 dose. 12/24/13  Yes Historical Provider, MD  lisinopril-hydrochlorothiazide (PRINZIDE,ZESTORETIC) 20-12.5 MG per tablet Take 1 tablet by mouth 1 day or 1 dose. 12/24/13  Yes Historical Provider, MD  Melatonin 1 MG CAPS Take 1 capsule by mouth at bedtime.   Yes Historical Provider, MD  ondansetron (ZOFRAN) 4 MG tablet  08/11/14  Yes Historical Provider, MD  simvastatin (ZOCOR) 40 MG tablet Take 40 mg by mouth every evening.   Yes Historical Provider, MD  solifenacin (VESICARE) 5 MG tablet Take 1 tablet by mouth 1 day or 1 dose. 04/05/14  Yes Historical Provider, MD    Patient Active Problem List   Diagnosis Date Noted  . Personal history of surgery to heart and great vessels, presenting hazards to health 05/14/2014  . Raynaud's syndrome 05/14/2014  . Adult hypothyroidism 05/14/2014  . Hypercholesteremia 05/14/2014  . Coronary artery abnormality 05/14/2014  . Essential (primary) hypertension 05/14/2014  . Personal  history of malignant neoplasm of breast 05/14/2014  . Lymphoma 04/14/2014    Past Medical History  Diagnosis Date  . Hypertension   . Hypercholesteremia   . Lymphoma 2007  . Benign neoplasm of rectum and anal canal   . 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 Marital Status: Married    Spouse Name: N/A  . Number of Children: N/A  . Years of Education: N/A   Occupational History  . Not on file.   Social History Main Topics  . Smoking status: Never Smoker   . Smokeless tobacco: Never Used  . Alcohol Use: 1.8 - 2.4 oz/week    3-4 Shots of liquor per week  . Drug Use: No  . Sexual Activity: No   Other Topics Concern  . Not on file   Social History Narrative    No Known Allergies  Review of Systems  Constitutional: Negative.   Respiratory: Negative.   Cardiovascular: Negative.   Gastrointestinal: Negative.   Genitourinary: Positive for urgency, frequency and flank pain.  Musculoskeletal: Negative.     Immunization History  Administered Date(s) Administered  . Pneumococcal Conjugate-13 12/31/2013  . Pneumococcal Polysaccharide-23 11/21/2010  . Td 10/09/2007   Objective:  BP 114/62 mmHg  Pulse 84  Temp(Src) 97.5 F (36.4 C)  Resp 18  Wt 142 lb (64.411 kg)  Physical Exam  Lab Results  Component Value Date   WBC 3.0* 09/07/2014   HGB 12.2 09/07/2014   HCT 35.5 09/07/2014   PLT 143* 09/07/2014   GLUCOSE 90 09/07/2014   CHOL 214* 01/20/2014   TRIG 68 01/20/2014   HDL 73* 01/20/2014   LDLCALC 127 01/20/2014   TSH 1.81 05/25/2013    CMP     Component Value Date/Time   NA 140 09/07/2014 0944   NA 141 05/11/2014 0853   NA 142 05/25/2013   K 3.8 09/07/2014 0944   K 3.6 05/11/2014 0853   CL 106 09/07/2014 0944   CL 103 05/11/2014 0853   CO2 28 09/07/2014 0944   CO2 29 05/11/2014 0853   GLUCOSE 90 09/07/2014 0944   GLUCOSE 107* 05/11/2014 0853   BUN 30*  09/07/2014 0944   BUN 22* 05/11/2014 0853   BUN 27* 05/25/2013   CREATININE 0.99 09/07/2014 0944   CREATININE 1.07* 05/11/2014 0853   CREATININE 1.1 05/25/2013   CALCIUM 9.3 09/07/2014 0944   CALCIUM 10.1 05/11/2014 0853   PROT 6.6 09/07/2014 0944   PROT 7.3 05/11/2014 0853   ALBUMIN 4.0 09/07/2014 0944   ALBUMIN 4.7 05/11/2014 0853   AST 24 09/07/2014 0944   AST 29 05/11/2014 0853   ALT 25 09/07/2014 0944   ALT 20 05/11/2014 0853   ALKPHOS 65 09/07/2014 0944   ALKPHOS 47 05/11/2014 0853   BILITOT 0.8 09/07/2014 0944   BILITOT 1.0 05/11/2014 0853   GFRNONAA 54* 09/07/2014 0944   GFRNONAA 51* 05/11/2014 0853   GFRNONAA 58* 12/31/2013 1052   GFRAA >60 09/07/2014 0944   GFRAA 59* 05/11/2014 0853   GFRAA >60 12/31/2013 1052    Assessment and Plan :  1. Urinary tract infection without hematuria, site unspecified  - Urine Culture 2.Lymphoma Per Dr Oliva Bustard. 3.CAD  I have done the exam and reviewed the above chart and it is accurate to the best of my knowledge.   Miguel Aschoff MD [facility] 09/28/2014 4:07 PM

## 2014-09-29 ENCOUNTER — Other Ambulatory Visit: Payer: Self-pay | Admitting: Family Medicine

## 2014-09-29 DIAGNOSIS — N390 Urinary tract infection, site not specified: Secondary | ICD-10-CM | POA: Diagnosis not present

## 2014-09-30 LAB — URINE CULTURE: Organism ID, Bacteria: NO GROWTH

## 2014-10-07 ENCOUNTER — Telehealth: Payer: Self-pay

## 2014-10-07 NOTE — Telephone Encounter (Signed)
ERROR

## 2014-10-07 NOTE — Telephone Encounter (Signed)
Patient request Survivorship Care Plan be mailed to residence.  Patient lives 3 hours away and she was not available to come to cancer center on days [place] could be presented to her.  Informed patient SCP will be mailed and to contact me for any questions or concerns about SCP or [facility] answers Survivorship booklet.

## 2014-10-14 ENCOUNTER — Encounter: Payer: Self-pay | Admitting: *Deleted

## 2014-11-16 ENCOUNTER — Ambulatory Visit
Admission: RE | Admit: 2014-11-16 | Discharge: 2014-11-16 | Disposition: A | Discharge status: Home / Self Care | Payer: Medicare Other | Source: Ambulatory Visit | Attending: Oncology | Admitting: Oncology

## 2014-11-16 DIAGNOSIS — R590 Localized enlarged lymph nodes: Secondary | ICD-10-CM | POA: Insufficient documentation

## 2014-11-16 DIAGNOSIS — Z08 Encounter for follow-up examination after completed treatment for malignant neoplasm: Secondary | ICD-10-CM | POA: Insufficient documentation

## 2014-11-16 DIAGNOSIS — C8590 Non-Hodgkin lymphoma, unspecified, unspecified site: Secondary | ICD-10-CM | POA: Insufficient documentation

## 2014-11-16 LAB — GLUCOSE, CAPILLARY: Glucose-Capillary: 78 mg/dL (ref 65–99)

## 2014-11-16 MED ORDER — FLUDEOXYGLUCOSE F - 18 (FDG) INJECTION
11.6800 | Freq: Once | INTRAVENOUS | Status: DC | PRN
  Administered 2014-11-16: 11.68 via INTRAVENOUS
  Filled 2014-11-16: qty 11.68

## 2014-11-18 ENCOUNTER — Encounter: Payer: Self-pay | Admitting: Oncology

## 2014-11-18 ENCOUNTER — Inpatient Hospital Stay: Payer: Medicare Other | Attending: Oncology | Admitting: Oncology

## 2014-11-18 ENCOUNTER — Inpatient Hospital Stay: Payer: Medicare Other

## 2014-11-18 ENCOUNTER — Other Ambulatory Visit: Payer: Medicare Other

## 2014-11-18 ENCOUNTER — Ambulatory Visit: Payer: Medicare Other | Admitting: Oncology

## 2014-11-18 ENCOUNTER — Ambulatory Visit: Payer: Medicare Other

## 2014-11-18 VITALS — BP 116/71 | HR 84 | Temp 96.0°F | Wt 140.2 lb

## 2014-11-18 DIAGNOSIS — Z1231 Encounter for screening mammogram for malignant neoplasm of breast: Secondary | ICD-10-CM

## 2014-11-18 DIAGNOSIS — Z7982 Long term (current) use of aspirin: Secondary | ICD-10-CM | POA: Diagnosis not present

## 2014-11-18 DIAGNOSIS — C8590 Non-Hodgkin lymphoma, unspecified, unspecified site: Secondary | ICD-10-CM

## 2014-11-18 DIAGNOSIS — D128 Benign neoplasm of rectum: Secondary | ICD-10-CM | POA: Insufficient documentation

## 2014-11-18 DIAGNOSIS — Z79899 Other long term (current) drug therapy: Secondary | ICD-10-CM

## 2014-11-18 DIAGNOSIS — Z803 Family history of malignant neoplasm of breast: Secondary | ICD-10-CM | POA: Diagnosis not present

## 2014-11-18 DIAGNOSIS — E7800 Pure hypercholesterolemia, unspecified: Secondary | ICD-10-CM | POA: Diagnosis not present

## 2014-11-18 DIAGNOSIS — Z8572 Personal history of non-Hodgkin lymphomas: Secondary | ICD-10-CM | POA: Insufficient documentation

## 2014-11-18 DIAGNOSIS — I10 Essential (primary) hypertension: Secondary | ICD-10-CM | POA: Diagnosis not present

## 2014-11-18 DIAGNOSIS — Z9221 Personal history of antineoplastic chemotherapy: Secondary | ICD-10-CM | POA: Diagnosis not present

## 2014-11-18 DIAGNOSIS — Z801 Family history of malignant neoplasm of trachea, bronchus and lung: Secondary | ICD-10-CM

## 2014-11-18 LAB — CBC WITH DIFFERENTIAL/PLATELET
BASOS PCT: 2 %
Basophils Absolute: 0 10*3/uL (ref 0–0.1)
EOS ABS: 0.1 10*3/uL (ref 0–0.7)
Eosinophils Relative: 4 %
HCT: 40.4 % (ref 35.0–47.0)
HEMOGLOBIN: 13.4 g/dL (ref 12.0–16.0)
LYMPHS ABS: 0.5 10*3/uL — AB (ref 1.0–3.6)
Lymphocytes Relative: 19 %
MCH: 28.8 pg (ref 26.0–34.0)
MCHC: 33.3 g/dL (ref 32.0–36.0)
MCV: 86.6 fL (ref 80.0–100.0)
MONO ABS: 0.3 10*3/uL (ref 0.2–0.9)
MONOS PCT: 10 %
NEUTROS ABS: 1.8 10*3/uL (ref 1.4–6.5)
NEUTROS PCT: 65 %
Platelets: 131 10*3/uL — ABNORMAL LOW (ref 150–440)
RBC: 4.67 MIL/uL (ref 3.80–5.20)
RDW: 14.4 % (ref 11.5–14.5)
WBC: 2.8 10*3/uL — ABNORMAL LOW (ref 3.6–11.0)

## 2014-11-18 LAB — COMPREHENSIVE METABOLIC PANEL
ALBUMIN: 4.1 g/dL (ref 3.5–5.0)
ALK PHOS: 68 U/L (ref 38–126)
ALT: 31 U/L (ref 14–54)
AST: 32 U/L (ref 15–41)
Anion gap: 6 (ref 5–15)
BUN: 21 mg/dL — ABNORMAL HIGH (ref 6–20)
CALCIUM: 9.4 mg/dL (ref 8.9–10.3)
CO2: 28 mmol/L (ref 22–32)
CREATININE: 0.99 mg/dL (ref 0.44–1.00)
Chloride: 105 mmol/L (ref 101–111)
GFR calc non Af Amer: 54 mL/min — ABNORMAL LOW (ref >60)
GLUCOSE: 119 mg/dL — AB (ref 65–99)
Potassium: 3.8 mmol/L (ref 3.5–5.1)
SODIUM: 139 mmol/L (ref 135–145)
Total Bilirubin: 0.9 mg/dL (ref 0.3–1.2)
Total Protein: 6.7 g/dL (ref 6.5–8.1)

## 2014-11-18 LAB — LACTATE DEHYDROGENASE: LDH: 172 U/L (ref 98–192)

## 2014-11-18 NOTE — Progress Notes (Signed)
[place] @ [facility] Telephone:([phone]) [phone]  Fax:([phone]) [place]: Oct 03, 1940  MR#: [id number]  KY[facility]#:[id number]  Patient Care Team: [name], MD as PCP - General (Unknown Physician Specialty) Robert Bellow, MD (General Surgery) Forest Gleason, MD (Oncology)  CHIEF COMPLAINT:  Chief Complaint  Patient presents with  . Results    1. Lymphoma low-grade, status Rituxan therapy. 2. Recurrent disease by clinical examination in December of 2012 3. Ovarian mass on PET scan in December of 2012 (normal CA 125). Ovarian mass has resolved after rituximab therapy. 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  Patient had total 5 cycles of chemotherapy with bendamustine and rituximab and 6 cycle was omitted because of significant side effects with and weakness and fatigue.  I asked treatment was in July of 2016   8.PET scan in October of 2016 Patient is   In complete remission     Oncology Flowsheet 06/08/2014 06/09/2014 07/07/2014 07/08/2014 08/10/2014 08/11/2014  Day, Cycle 2, Cycle 1 Day 2, Cycle 1 Day 1, Cycle 2 Day 2, Cycle 2 Day 1, Cycle 3 Day 2, Cycle 3  bendamustine (BENDEKA) IV - - 80 mg/m2 80 mg/m2 80 mg/m2 80 mg/m2  bendamustine (TREANDA) IV 80 mg/m2 144 mg - - - -  dexamethasone (DECADRON) IV [ 10 mg ] [ 10 mg ] [ 10 mg ] [ 10 mg ] [ 10 mg ] [ 10 mg ]  diphenhydrAMINE (BENADRYL) PO 50 mg - 50 mg - 50 mg -  ondansetron (ZOFRAN) IV [ 8 mg ] [ 8 mg ] [ 8 mg ] [ 8 mg ] [ 8 mg ] [ 8 mg ]  pegfilgrastim (NEULASTA ONPRO KIT) [place] - 6 mg - 6 mg - 6 mg  riTUXimab (RITUXAN) IV 375 mg/m2 - 700 mg - 375 mg/m2 -    INTERVAL HISTORY:  74 year old lady with a history of low-grade lymphoma.  Status post chemotherapy with bendamustine and  rituximab  Patient is here for ongoing evaluation and follow-up.  No chills.  No fever.  No abdominal pain. REVIEW OF SYSTEMS:   GENERAL:  Feels good.  Active.  No fevers, sweats or weight loss. Fatigue grade 2 He should not at some generalized pain lasting for 3 days after last chemotherapy PERFORMANCE STATUS (ECOG):0 HEENT:  No visual changes, runny nose, sore throat, mouth sores or tenderness. Lungs: No shortness of breath or cough.  No hemoptysis. Cardiac:  No chest pain, palpitations, orthopnea, or PND. GI:  No nausea, vomiting, diarrhea, constipation, melena or hematochezia. GU:  No urgency, frequency, dysuria, or hematuria. Musculoskeletal:  No back pain.  No joint pain.  No muscle tenderness. Extremities:  No pain or swelling. Skin:  No rashes or skin changes. Neuro:  No headache, numbness or weakness, balance or coordination issues. Endocrine:  No diabetes, thyroid issues, hot flashes or night sweats. Psych:  No mood changes, depression or anxiety. Pain:  No focal pain. Review of systems:  All other systems reviewed and found to be negative.  As per HPI. Otherwise, a complete review of systems is negatve.  PAST MEDICAL HISTORY: Past Medical History  Diagnosis Date  . Hypertension   . Hypercholesteremia   . Lymphoma ([place]) 2007  . Benign neoplasm of rectum and anal canal   .  Chronic leukemia of unspecified cell type, without mention of having achieved remission     PAST SURGICAL HISTORY: Past Surgical History  Procedure Laterality Date  . Colonoscopy  2009, 2012     hyperplastic rectal polyp 2012 tubular adenoma of proximal ascending colon 2000 9 repeat exam due to 2017.  [name] Flexible sigmoidoscopy  1998  . Cardiac catheterization  2014  . Appendectomy    . Breast surgery Left 1990    biopsy  . Breast surgery Right May 08, 2012    complex fibroadenoma without malignancy.  . Coronary artery bypass graft  1999  . Abdominal hysterectomy  1958    FAMILY  HISTORY Family History  Problem Relation Age of Onset  . Cancer Other     breast cancer niece, pancreatic cancer niece  . Lung cancer Father     Died age 3  . Heart disease Mother     Died age 41  . Asthma Mother   . CAD Brother     CABG age 79, died age 43  . Heart attack Sister   . Tuberculosis Sister     37 years old  . Lung cancer Sister     Died in her 3s  . Angina Sister   . Breast cancer Sister   . Rheum arthritis Sister   . Aneurysm Sister     Brain, died age 56  . Angina Sister   . Heart disease Brother     Stent placement  . Heart disease Brother     stent placement    ADVANCED DIRECTIVES:  No flowsheet data found.  HEALTH MAINTENANCE: Social History  Substance Use Topics  . Smoking status: Never Smoker   . Smokeless tobacco: Never Used  . Alcohol Use: 1.8 - 2.4 oz/week    3-4 Shots of liquor per week      No Known Allergies  Current Outpatient Prescriptions  Medication Sig Dispense Refill  . ASPIRIN EC LOW STRENGTH PO Take 1 tablet by mouth 1 day or 1 dose.    . levothyroxine (SYNTHROID, LEVOTHROID) 88 MCG tablet Take 1 tablet by mouth 1 day or 1 dose.    [name] lisinopril-hydrochlorothiazide (PRINZIDE,ZESTORETIC) 20-12.5 MG per tablet Take 1 tablet by mouth 1 day or 1 dose.    . Melatonin 1 MG CAPS Take 1 capsule by mouth at bedtime.    . nitrofurantoin, macrocrystal-monohydrate, (MACROBID) 100 MG capsule Take 1 capsule (100 mg total) by mouth 2 (two) times daily. 14 capsule 10  . ondansetron (ZOFRAN) 4 MG tablet     . simvastatin (ZOCOR) 40 MG tablet Take 40 mg by mouth every evening.    . solifenacin (VESICARE) 5 MG tablet Take 1 tablet by mouth 1 day or 1 dose.     No current facility-administered medications for this visit.   Facility-Administered Medications Ordered in Other Visits  Medication Dose Route Frequency Provider Last Rate Last Dose  . fludeoxyglucose F - 18 (FDG) injection 11.68 milli Curie  11.68 milli Curie Intravenous Once PRN  Forest Gleason, MD   11.68 milli Curie at 11/16/14 7062    OBJECTIVE:  Filed Vitals:   11/18/14 1015  BP: 116/71  Pulse: 84  Temp: 96 F (35.6 C)     Body mass index is 23.33 kg/(m^2).    ECOG FS:0 - Asymptomatic  PHYSICAL EXAM: GENERAL:  Well developed, well nourished, sitting comfortably in the exam room in no acute distress. MENTAL STATUS:  Alert and oriented to person, place and  time. HEAD:    Normocephalic, atraumatic, face symmetric, no Cushingoid features. EYES:  .  Pupils equal round and reactive to light and accomodation.  No conjunctivitis or scleral icterus. ENT:  Oropharynx clear without lesion.  Tongue normal. Mucous membranes moist.  RESPIRATORY:  Clear to auscultation without rales, wheezes or rhonchi. CARDIOVASCULAR:  Regular rate and rhythm without murmur, rub or gallop.  ABDOMEN:  Soft, non-tender, with active bowel sounds, and no hepatosplenomegaly.  No masses. BACK:  No CVA tenderness.  No tenderness on percussion of the back or rib cage. SKIN:  No rashes, ulcers or lesions. EXTREMITIES: No edema, no skin discoloration or tenderness.  No palpable cords. LYMPH NODES: All palpable lymph node has resolved . NEUROLOGICAL: Unremarkable. PSYCH:  Appropriate.  LAB RESULTS:  Appointment on 11/18/2014  Component Date Value Ref Range Status  . WBC 11/18/2014 2.8* 3.6 - 11.0 K/uL Final  . RBC 11/18/2014 4.67  3.80 - 5.20 MIL/uL Final  . Hemoglobin 11/18/2014 13.4  12.0 - 16.0 g/dL Final  . HCT 11/18/2014 40.4  35.0 - 47.0 % Final  . MCV 11/18/2014 86.6  80.0 - 100.0 fL Final  . MCH 11/18/2014 28.8  26.0 - 34.0 pg Final  . MCHC 11/18/2014 33.3  32.0 - 36.0 g/dL Final  . RDW 11/18/2014 14.4  11.5 - 14.5 % Final  . Platelets 11/18/2014 131* 150 - 440 K/uL Final  . Neutrophils Relative % 11/18/2014 65   Final  . Neutro Abs 11/18/2014 1.8  1.4 - 6.5 K/uL Final  . Lymphocytes Relative 11/18/2014 19   Final  . Lymphs Abs 11/18/2014 0.5* 1.0 - 3.6 K/uL Final  . Monocytes  Relative 11/18/2014 10   Final  . Monocytes Absolute 11/18/2014 0.3  0.2 - 0.9 K/uL Final  . Eosinophils Relative 11/18/2014 4   Final  . Eosinophils Absolute 11/18/2014 0.1  0 - 0.7 K/uL Final  . Basophils Relative 11/18/2014 2   Final  . Basophils Absolute 11/18/2014 0.0  0 - 0.1 K/uL Final  . Sodium 11/18/2014 139  135 - 145 mmol/L Final  . Potassium 11/18/2014 3.8  3.5 - 5.1 mmol/L Final  . Chloride 11/18/2014 105  101 - 111 mmol/L Final  . CO2 11/18/2014 28  22 - 32 mmol/L Final  . Glucose, Bld 11/18/2014 119* 65 - 99 mg/dL Final  . BUN 11/18/2014 21* 6 - 20 mg/dL Final  . Creatinine, Ser 11/18/2014 0.99  0.44 - 1.00 mg/dL Final  . Calcium 11/18/2014 9.4  8.9 - 10.3 mg/dL Final  . Total Protein 11/18/2014 6.7  6.5 - 8.1 g/dL Final  . Albumin 11/18/2014 4.1  3.5 - 5.0 g/dL Final  . AST 11/18/2014 32  15 - 41 U/L Final  . ALT 11/18/2014 31  14 - 54 U/L Final  . Alkaline Phosphatase 11/18/2014 68  38 - 126 U/L Final  . Total Bilirubin 11/18/2014 0.9  0.3 - 1.2 mg/dL Final  . GFR calc non Af Amer 11/18/2014 54* >60 mL/min Final  . GFR calc Af Amer 11/18/2014 >60  >60 mL/min Final   Comment: (NOTE) The eGFR has been calculated using the CKD EPI equation. This calculation has not been validated in all clinical situations. eGFR's persistently <60 mL/min signify possible Chronic Kidney Disease.   . Anion gap 11/18/2014 6  5 - 15 Final  . LDH 11/18/2014 172  98 - 192 U/L Final      ASSESSMENT: Low-grade lymphoma patient has responded very well to bendamustine and Rituxan.  At present time clinically patient is in complete remission PET scan has been reviewed independently reviewed with the patient. There is complete resolution of fall abnormality.  Clinically there are no palpable lymph node.  Continue follow-up without any intervention.  Because of small volume disease no further maintenance rituximab has been recommended. Patient will get her flu vaccine with primary care  physician Will proceed with a mammogram (screening in November. Has an appointment with primary care physician on November 17.   Lymphoma   Staging form: Lymphoid Neoplasms, AJCC 6th Edition     Clinical: No stage assigned - Marni Griffon, MD   11/18/2014 10:46 AM

## 2014-11-29 ENCOUNTER — Ambulatory Visit: Payer: Self-pay | Admitting: Family Medicine

## 2014-12-09 ENCOUNTER — Ambulatory Visit (INDEPENDENT_AMBULATORY_CARE_PROVIDER_SITE_OTHER): Payer: Medicare Other | Admitting: Family Medicine

## 2014-12-09 ENCOUNTER — Ambulatory Visit
Admission: RE | Admit: 2014-12-09 | Discharge: 2014-12-09 | Disposition: A | Discharge status: Home / Self Care | Payer: Medicare Other | Source: Ambulatory Visit | Attending: Oncology | Admitting: Oncology

## 2014-12-09 ENCOUNTER — Encounter: Payer: Self-pay | Admitting: Family Medicine

## 2014-12-09 ENCOUNTER — Other Ambulatory Visit: Payer: Self-pay | Admitting: Oncology

## 2014-12-09 VITALS — BP 114/62 | HR 72 | Temp 98.4°F | Resp 14 | Wt 142.0 lb

## 2014-12-09 DIAGNOSIS — Z1231 Encounter for screening mammogram for malignant neoplasm of breast: Secondary | ICD-10-CM | POA: Diagnosis not present

## 2014-12-09 DIAGNOSIS — Z23 Encounter for immunization: Secondary | ICD-10-CM

## 2014-12-09 DIAGNOSIS — E038 Other specified hypothyroidism: Secondary | ICD-10-CM | POA: Diagnosis not present

## 2014-12-09 DIAGNOSIS — I10 Essential (primary) hypertension: Secondary | ICD-10-CM

## 2014-12-09 DIAGNOSIS — E7800 Pure hypercholesterolemia, unspecified: Secondary | ICD-10-CM

## 2014-12-09 MED ORDER — LEVOTHYROXINE SODIUM 88 MCG PO TABS: 88.0000 ug | ORAL_TABLET | ORAL | Status: DC

## 2014-12-09 MED ORDER — LISINOPRIL-HYDROCHLOROTHIAZIDE 20-12.5 MG PO TABS: 1.0000 | ORAL_TABLET | ORAL | Status: DC

## 2014-12-09 NOTE — Progress Notes (Signed)
Patient ID: Jacqueline Webb, female   DOB: September 27, 1940, 74 y.o.   MRN: [id number]    Subjective:  Hyperlipidemia  Hypertension    Hypertension follow up: Patient was last seen in September for UTI. She does not check her B/P due to seen Dr. Oliva Bustard often and having it checked there. BP Readings from Last 3 Encounters:  12/09/14 114/62  11/18/14 116/71  09/28/14 114/62    Hyperlipidemia follow up: Lab Results  Component Value Date   CHOL 214* 01/20/2014   HDL 73* 01/20/2014   LDLCALC 127 01/20/2014   TRIG 68 01/20/2014    Hypothyroidism follow up: Lab Results  Component Value Date   TSH 1.81 05/25/2013   CBC and MetC gets checked routinely with Dr. Oliva Bustard. Prior to Admission medications   Medication Sig Start Date End Date Taking? Authorizing Provider  ASPIRIN EC LOW STRENGTH PO Take 1 tablet by mouth 1 day or 1 dose. 07/13/10  Yes Historical Provider, MD  levothyroxine (SYNTHROID, LEVOTHROID) 88 MCG tablet Take 1 tablet (88 mcg total) by mouth 1 day or 1 dose. 12/09/14  Yes Richard Maceo Pro., MD  lisinopril-hydrochlorothiazide (PRINZIDE,ZESTORETIC) 20-12.5 MG tablet Take 1 tablet by mouth 1 day or 1 dose. 12/09/14  Yes Richard Maceo Pro., MD  Melatonin 1 MG CAPS Take 1 capsule by mouth at bedtime.   Yes Historical Provider, MD  nitrofurantoin, macrocrystal-monohydrate, (MACROBID) 100 MG capsule Take 1 capsule (100 mg total) by mouth 2 (two) times daily. 09/28/14  Yes Richard Maceo Pro., MD  ondansetron [facility]) 4 MG tablet  08/11/14  Yes Historical Provider, MD  simvastatin (ZOCOR) 40 MG tablet Take 40 mg by mouth every evening.   Yes Historical Provider, MD  solifenacin (VESICARE) 5 MG tablet Take 1 tablet by mouth 1 day or 1 dose. 04/05/14  Yes Historical Provider, MD    Patient Active Problem List   Diagnosis Date Noted  . Personal history of surgery to heart and great vessels, presenting hazards to health 05/14/2014  . Raynaud's syndrome 05/14/2014  . Adult  hypothyroidism 05/14/2014  . Hypercholesteremia 05/14/2014  . Coronary artery abnormality 05/14/2014  . Essential (primary) hypertension 05/14/2014  . Personal history of malignant neoplasm of breast 05/14/2014  . Lymphoma ([place]) 04/14/2014    Past Medical History  Diagnosis Date  . Hypertension   . Hypercholesteremia   . Lymphoma ([place]) 2007  . Benign neoplasm of rectum and anal canal   . 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 Marital Status: Married    Spouse Name: N/A  . Number of Children: N/A  . Years of Education: N/A   Occupational History  . Not on file.   Social History Main Topics  . Smoking status: Never Smoker   . Smokeless tobacco: Never Used  . Alcohol Use: 1.8 - 2.4 oz/week    3-4 Shots of liquor per week  . Drug Use: No  . Sexual Activity: No   Other Topics Concern  . Not on file   Social History Narrative    No Known Allergies  Review of Systems  Constitutional: Negative.   Eyes: Negative.   Respiratory: Negative.   Cardiovascular: Negative.   Gastrointestinal: Negative.   Musculoskeletal: Negative.   Skin: Negative.   Neurological: Negative.   Psychiatric/Behavioral: Negative.     Immunization History  Administered Date(s) Administered  . Pneumococcal Conjugate-13 12/31/2013  . Pneumococcal  Polysaccharide-23 11/21/2010  . Td 10/09/2007   Objective:  BP 114/62 mmHg  Pulse 72  Temp(Src) 98.4 F (36.9 C)  Resp 14  Wt 142 lb (64.411 kg)  Physical Exam  Constitutional: She is oriented to person, place, and time and well-developed, well-nourished, and in no distress.  HENT:  Head: Normocephalic and atraumatic.  Eyes: Conjunctivae are normal. Pupils are equal, round, and reactive to light.  Neck: Normal range of motion. Neck supple.  Cardiovascular: Normal rate, regular rhythm, normal heart sounds and intact distal pulses.   No murmur  heard. Pulmonary/Chest: Effort normal and breath sounds normal.  Musculoskeletal: Normal range of motion.  Neurological: She is alert and oriented to person, place, and time.  Psychiatric: Mood, memory, affect and judgment normal.    Lab Results  Component Value Date   WBC 2.8* 11/18/2014   HGB 13.4 11/18/2014   HCT 40.4 11/18/2014   PLT 131* 11/18/2014   GLUCOSE 119* 11/18/2014   CHOL 214* 01/20/2014   TRIG 68 01/20/2014   HDL 73* 01/20/2014   LDLCALC 127 01/20/2014   TSH 1.81 05/25/2013    CMP     Component Value Date/Time   NA 139 11/18/2014 0936   NA 141 05/11/2014 0853   NA 142 05/25/2013   K 3.8 11/18/2014 0936   K 3.6 05/11/2014 0853   CL 105 11/18/2014 0936   CL 103 05/11/2014 0853   CO2 28 11/18/2014 0936   CO2 29 05/11/2014 0853   GLUCOSE 119* 11/18/2014 0936   GLUCOSE 107* 05/11/2014 0853   BUN 21* 11/18/2014 0936   BUN 22* 05/11/2014 0853   BUN 27* 05/25/2013   CREATININE 0.99 11/18/2014 0936   CREATININE 1.07* 05/11/2014 0853   CREATININE 1.1 05/25/2013   CALCIUM 9.4 11/18/2014 0936   CALCIUM 10.1 05/11/2014 0853   PROT 6.7 11/18/2014 0936   PROT 7.3 05/11/2014 0853   ALBUMIN 4.1 11/18/2014 0936   ALBUMIN 4.7 05/11/2014 0853   AST 32 11/18/2014 0936   AST 29 05/11/2014 0853   ALT 31 11/18/2014 0936   ALT 20 05/11/2014 0853   ALKPHOS 68 11/18/2014 0936   ALKPHOS 47 05/11/2014 0853   BILITOT 0.9 11/18/2014 0936   BILITOT 1.0 05/11/2014 0853   GFRNONAA 54* 11/18/2014 0936   GFRNONAA 51* 05/11/2014 0853   GFRNONAA 58* 12/31/2013 1052   GFRAA >60 11/18/2014 0936   GFRAA 59* 05/11/2014 0853   GFRAA >60 12/31/2013 1052    Assessment and Plan :  1. Essential (primary) hypertension Stable.  2. Other specified hypothyroidism  - TSH  3. Hypercholesteremia  - Lipid Panel With LDL/HDL Ratio  4. Need for influenza vaccination  - Flu vaccine HIGH DOSE PF (Fluzone High dose) 5.[place] MD [facility] 12/09/2014 4:11 PM

## 2014-12-10 DIAGNOSIS — I10 Essential (primary) hypertension: Secondary | ICD-10-CM | POA: Diagnosis not present

## 2014-12-10 DIAGNOSIS — E7800 Pure hypercholesterolemia, unspecified: Secondary | ICD-10-CM | POA: Diagnosis not present

## 2014-12-10 DIAGNOSIS — Z23 Encounter for immunization: Secondary | ICD-10-CM | POA: Diagnosis not present

## 2014-12-10 DIAGNOSIS — E038 Other specified hypothyroidism: Secondary | ICD-10-CM | POA: Diagnosis not present

## 2014-12-14 ENCOUNTER — Other Ambulatory Visit: Payer: Self-pay

## 2014-12-14 MED ORDER — LISINOPRIL-HYDROCHLOROTHIAZIDE 20-12.5 MG PO TABS: 1.0000 | ORAL_TABLET | ORAL | Status: DC

## 2014-12-14 MED ORDER — LEVOTHYROXINE SODIUM 88 MCG PO TABS: 88.0000 ug | ORAL_TABLET | ORAL | Status: DC

## 2014-12-21 ENCOUNTER — Telehealth: Payer: Self-pay | Admitting: Family Medicine

## 2014-12-21 NOTE — Telephone Encounter (Signed)
[facility] called for clarification on a prescription for Mrs. Jacqueline Webb.   Please give directions on her   Levothyroxine and Lisinpril  Call back to pharmacy is [phone]  Thanks Con Memos

## 2014-12-22 DIAGNOSIS — E7800 Pure hypercholesterolemia, unspecified: Secondary | ICD-10-CM | POA: Diagnosis not present

## 2014-12-22 DIAGNOSIS — E038 Other specified hypothyroidism: Secondary | ICD-10-CM | POA: Diagnosis not present

## 2014-12-22 MED ORDER — LISINOPRIL-HYDROCHLOROTHIAZIDE 20-12.5 MG PO TABS: 1.0000 | ORAL_TABLET | Freq: Every day | ORAL | Status: DC

## 2014-12-22 MED ORDER — LEVOTHYROXINE SODIUM 88 MCG PO TABS: 88.0000 ug | ORAL_TABLET | Freq: Every day | ORAL | Status: DC

## 2014-12-22 NOTE — Telephone Encounter (Signed)
Re sent RXs with corrected more understandable dose-aa

## 2014-12-23 LAB — LIPID PANEL WITH LDL/HDL RATIO
CHOLESTEROL TOTAL: 186 mg/dL (ref 100–199)
HDL: 74 mg/dL (ref >39)
LDL CALC: 99 mg/dL (ref 0–99)
LDl/HDL Ratio: 1.3 ratio units (ref 0.0–3.2)
Triglycerides: 64 mg/dL (ref 0–149)
VLDL CHOLESTEROL CAL: 13 mg/dL (ref 5–40)

## 2014-12-23 LAB — TSH: TSH: 0.624 u[IU]/mL (ref 0.450–4.500)

## 2015-03-10 DIAGNOSIS — H401130 Primary open-angle glaucoma, bilateral, stage unspecified: Secondary | ICD-10-CM | POA: Diagnosis not present

## 2015-03-10 DIAGNOSIS — Z961 Presence of intraocular lens: Secondary | ICD-10-CM | POA: Diagnosis not present

## 2015-03-29 ENCOUNTER — Encounter: Payer: Self-pay | Admitting: *Deleted

## 2015-05-18 ENCOUNTER — Ambulatory Visit: Payer: Medicare Other | Admitting: Oncology

## 2015-05-18 ENCOUNTER — Other Ambulatory Visit: Payer: Medicare Other

## 2015-05-19 ENCOUNTER — Inpatient Hospital Stay: Payer: Medicare Other | Attending: Oncology

## 2015-05-19 ENCOUNTER — Inpatient Hospital Stay (HOSPITAL_BASED_OUTPATIENT_CLINIC_OR_DEPARTMENT_OTHER): Payer: Medicare Other | Admitting: Oncology

## 2015-05-19 ENCOUNTER — Encounter: Payer: Self-pay | Admitting: Oncology

## 2015-05-19 VITALS — BP 141/96 | HR 97 | Temp 96.4°F | Resp 18 | Wt 147.3 lb

## 2015-05-19 DIAGNOSIS — E7800 Pure hypercholesterolemia, unspecified: Secondary | ICD-10-CM

## 2015-05-19 DIAGNOSIS — Z801 Family history of malignant neoplasm of trachea, bronchus and lung: Secondary | ICD-10-CM

## 2015-05-19 DIAGNOSIS — Z9221 Personal history of antineoplastic chemotherapy: Secondary | ICD-10-CM | POA: Insufficient documentation

## 2015-05-19 DIAGNOSIS — I10 Essential (primary) hypertension: Secondary | ICD-10-CM

## 2015-05-19 DIAGNOSIS — Z7982 Long term (current) use of aspirin: Secondary | ICD-10-CM | POA: Insufficient documentation

## 2015-05-19 DIAGNOSIS — Z8572 Personal history of non-Hodgkin lymphomas: Secondary | ICD-10-CM | POA: Insufficient documentation

## 2015-05-19 DIAGNOSIS — Z803 Family history of malignant neoplasm of breast: Secondary | ICD-10-CM

## 2015-05-19 DIAGNOSIS — Z86010 Personal history of colonic polyps: Secondary | ICD-10-CM | POA: Diagnosis not present

## 2015-05-19 DIAGNOSIS — C8590 Non-Hodgkin lymphoma, unspecified, unspecified site: Secondary | ICD-10-CM

## 2015-05-19 DIAGNOSIS — Z800 Family history of malignant neoplasm of digestive organs: Secondary | ICD-10-CM | POA: Diagnosis not present

## 2015-05-19 LAB — CBC WITH DIFFERENTIAL/PLATELET
BASOS ABS: 0 10*3/uL (ref 0–0.1)
BASOS PCT: 1 %
EOS ABS: 0.1 10*3/uL (ref 0–0.7)
EOS PCT: 2 %
HEMATOCRIT: 41.9 % (ref 35.0–47.0)
Hemoglobin: 14.6 g/dL (ref 12.0–16.0)
Lymphocytes Relative: 27 %
Lymphs Abs: 1 10*3/uL (ref 1.0–3.6)
MCH: 31.5 pg (ref 26.0–34.0)
MCHC: 34.9 g/dL (ref 32.0–36.0)
MCV: 90.3 fL (ref 80.0–100.0)
MONO ABS: 0.3 10*3/uL (ref 0.2–0.9)
Monocytes Relative: 8 %
NEUTROS ABS: 2.2 10*3/uL (ref 1.4–6.5)
Neutrophils Relative %: 62 %
PLATELETS: 145 10*3/uL — AB (ref 150–440)
RBC: 4.64 MIL/uL (ref 3.80–5.20)
RDW: 13.9 % (ref 11.5–14.5)
WBC: 3.6 10*3/uL (ref 3.6–11.0)

## 2015-05-19 LAB — COMPREHENSIVE METABOLIC PANEL
ALBUMIN: 4.5 g/dL (ref 3.5–5.0)
ALT: 18 U/L (ref 14–54)
ANION GAP: 6 (ref 5–15)
AST: 26 U/L (ref 15–41)
Alkaline Phosphatase: 52 U/L (ref 38–126)
BUN: 25 mg/dL — AB (ref 6–20)
CHLORIDE: 105 mmol/L (ref 101–111)
CO2: 30 mmol/L (ref 22–32)
Calcium: 11.2 mg/dL — ABNORMAL HIGH (ref 8.9–10.3)
Creatinine, Ser: 1 mg/dL (ref 0.44–1.00)
GFR calc Af Amer: >60 mL/min (ref >60)
GFR calc non Af Amer: 53 mL/min — ABNORMAL LOW (ref >60)
GLUCOSE: 114 mg/dL — AB (ref 65–99)
POTASSIUM: 3.8 mmol/L (ref 3.5–5.1)
SODIUM: 141 mmol/L (ref 135–145)
Total Bilirubin: 0.9 mg/dL (ref 0.3–1.2)
Total Protein: 7.1 g/dL (ref 6.5–8.1)

## 2015-05-19 LAB — LACTATE DEHYDROGENASE: LDH: 146 U/L (ref 98–192)

## 2015-05-19 LAB — SEDIMENTATION RATE: Sed Rate: 5 mm/hr (ref 0–30)

## 2015-05-19 NOTE — Progress Notes (Signed)
[place] @ [facility] [phone]  Fax:([phone]) [place] OB: October 10, 1940  MR#: [id number]  BPZ#:[id number]  Patient Care Team: [name], MD as PCP - General (Unknown Physician Specialty) Robert Bellow, MD (General Surgery) Forest Gleason, MD (Oncology)  CHIEF COMPLAINT:  Chief Complaint  Patient presents with  . Lymphoma    1. 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 Recurrent disease by clinical examination in December of 2012 3. Ovarian mass on PET scan in December of 2012 (normal CA 125). Ovarian mass has resolved after rituximab therapy. 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  Patient had total 5 cycles of chemotherapy with bendamustine and rituximab and 6 cycle was omitted because of significant side effects with and weakness and fatigue.  I asked treatment was in July of 2016   8.PET scan in October of 2016 Patient is   In complete remission       INTERVAL HISTORY:  75 year old lady with a history of low-grade lymphoma.  Status post chemotherapy with bendamustine and rituximab  Patient is feeling very well.  No abdominal pain.  No nausea no vomiting.  No chills.  Here for further follow-up and treatment consideration Patient is here for ongoing evaluation and follow-up.  No chills.  No fever.  No abdominal pain. REVIEW OF SYSTEMS:   Has not noticed any enlarged lymph nodes GENERAL:  Feels good.  Active.  No fevers, sweats or weight loss. Fatigue grade 2 He should not at some generalized pain lasting for 3 days after last chemotherapy PERFORMANCE STATUS (ECOG):0 HEENT:  No visual changes, runny nose, sore throat, mouth sores or tenderness. Lungs: No shortness of breath or cough.  No  hemoptysis. Cardiac:  No chest pain, palpitations, orthopnea, or PND. GI:  No nausea, vomiting, diarrhea, constipation, melena or hematochezia. GU:  No urgency, frequency, dysuria, or hematuria. Musculoskeletal:  No back pain.  No joint pain.  No muscle tenderness. Extremities:  No pain or swelling. Skin:  No rashes or skin changes. Neuro:  No headache, numbness or weakness, balance or coordination issues. Endocrine:  No diabetes, thyroid issues, hot flashes or night sweats. Psych:  No mood changes, depression or anxiety. Pain:  No focal pain. Review of systems:  All other systems reviewed and found to be negative.  As per HPI. Otherwise, a complete review of systems is negatve.  PAST MEDICAL HISTORY: Past Medical History  Diagnosis Date  . Hypertension   . Hypercholesteremia   . Lymphoma ([place]) 2007  . Benign neoplasm of rectum and anal canal   . Chronic leukemia of unspecified cell type, without mention of having achieved remission     PAST SURGICAL HISTORY: Past Surgical History  Procedure Laterality Date  . Colonoscopy  2009, 2012     hyperplastic rectal polyp 2012 tubular adenoma of proximal ascending colon 2000 9 repeat exam due to 2017.  [name] Flexible sigmoidoscopy  1998  . Cardiac catheterization  2014  . Appendectomy    . Breast surgery Left 1990    biopsy  . Breast surgery Right May 08, 2012    complex fibroadenoma without malignancy.  . Coronary artery bypass graft  1999  . Abdominal hysterectomy  1958  . Breast biopsy Right 1970    right breast biopsy with clip  .  Breast biopsy Left 2002    left breast biopsy with clip  . Breast biopsy Right 2014    right breast biopsy with clip  . Breast cyst aspiration Bilateral 2000    bilateral fine needle aspiration    FAMILY HISTORY Family History  Problem Relation Age of Onset  . Cancer Other     breast cancer niece, pancreatic cancer niece  . Breast cancer Other   . Lung cancer Father     Died age 6  . Heart  disease Mother     Died age 1  . Asthma Mother   . CAD Brother     CABG age 61, died age 20  . Heart attack Sister   . Tuberculosis Sister     68 years old  . Lung cancer Sister     Died in her 30s  . Angina Sister   . Breast cancer Sister   . Rheum arthritis Sister   . Aneurysm Sister     Brain, died age 72  . Angina Sister   . Heart disease Brother     Stent placement  . Heart disease Brother     stent placement    ADVANCED DIRECTIVES:  No flowsheet data found.  HEALTH MAINTENANCE: Social History  Substance Use Topics  . Smoking status: Never Smoker   . Smokeless tobacco: Never Used  . Alcohol Use: 1.8 - 2.4 oz/week    3-4 Shots of liquor per week      No Known Allergies  Current Outpatient Prescriptions  Medication Sig Dispense Refill  . ASPIRIN EC LOW STRENGTH PO Take 1 tablet by mouth 1 day or 1 dose.    . levothyroxine (SYNTHROID, LEVOTHROID) 88 MCG tablet Take 1 tablet (88 mcg total) by mouth daily before breakfast. 90 tablet 3  . lisinopril-hydrochlorothiazide (PRINZIDE,ZESTORETIC) 20-12.5 MG tablet Take 1 tablet by mouth daily. 90 tablet 3  . Melatonin 1 MG CAPS Take 1 capsule by mouth at bedtime.    . nitrofurantoin, macrocrystal-monohydrate, (MACROBID) 100 MG capsule Take 1 capsule (100 mg total) by mouth 2 (two) times daily. 14 capsule 10  . ondansetron (ZOFRAN) 4 MG tablet     . simvastatin (ZOCOR) 40 MG tablet Take 40 mg by mouth every evening.    . solifenacin (VESICARE) 5 MG tablet Take 1 tablet by mouth 1 day or 1 dose.     No current facility-administered medications for this visit.    OBJECTIVE:  Filed Vitals:   05/19/15 1149  BP: 141/96  Pulse: 97  Temp: 96.4 F (35.8 C)  Resp: 18     Body mass index is 24.51 kg/(m^2).    ECOG FS:0 - Asymptomatic  PHYSICAL EXAM: GENERAL:  Well developed, well nourished, sitting comfortably in the exam room in no acute distress. MENTAL STATUS:  Alert and oriented to person, place and time. HEAD:     Normocephalic, atraumatic, face symmetric, no Cushingoid features. EYES:  .  Pupils equal round and reactive to light and accomodation.  No conjunctivitis or scleral icterus. ENT:  Oropharynx clear without lesion.  Tongue normal. Mucous membranes moist.  RESPIRATORY:  Clear to auscultation without rales, wheezes or rhonchi. CARDIOVASCULAR:  Regular rate and rhythm without murmur, rub or gallop.  ABDOMEN:  Soft, non-tender, with active bowel sounds, and no hepatosplenomegaly.  No masses. BACK:  No CVA tenderness.  No tenderness on percussion of the back or rib cage. SKIN:  No rashes, ulcers or lesions. EXTREMITIES: No edema, no skin discoloration  or tenderness.  No palpable cords. LYMPH NODES: All palpable lymph node has resolved . NEUROLOGICAL: Unremarkable. PSYCH:  Appropriate.  LAB RESULTS:  Appointment on 05/19/2015  Component Date Value Ref Range Status  . WBC 05/19/2015 3.6  3.6 - 11.0 K/uL Final  . RBC 05/19/2015 4.64  3.80 - 5.20 MIL/uL Final  . Hemoglobin 05/19/2015 14.6  12.0 - 16.0 g/dL Final  . HCT 05/19/2015 41.9  35.0 - 47.0 % Final  . MCV 05/19/2015 90.3  80.0 - 100.0 fL Final  . MCH 05/19/2015 31.5  26.0 - 34.0 pg Final  . MCHC 05/19/2015 34.9  32.0 - 36.0 g/dL Final  . RDW 05/19/2015 13.9  11.5 - 14.5 % Final  . Platelets 05/19/2015 145* 150 - 440 K/uL Final  . Neutrophils Relative % 05/19/2015 62   Final  . Neutro Abs 05/19/2015 2.2  1.4 - 6.5 K/uL Final  . Lymphocytes Relative 05/19/2015 27   Final  . Lymphs Abs 05/19/2015 1.0  1.0 - 3.6 K/uL Final  . Monocytes Relative 05/19/2015 8   Final  . Monocytes Absolute 05/19/2015 0.3  0.2 - 0.9 K/uL Final  . Eosinophils Relative 05/19/2015 2   Final  . Eosinophils Absolute 05/19/2015 0.1  0 - 0.7 K/uL Final  . Basophils Relative 05/19/2015 1   Final  . Basophils Absolute 05/19/2015 0.0  0 - 0.1 K/uL Final  . Sodium 05/19/2015 141  135 - 145 mmol/L Final  . Potassium 05/19/2015 3.8  3.5 - 5.1 mmol/L Final  . Chloride  05/19/2015 105  101 - 111 mmol/L Final  . CO2 05/19/2015 30  22 - 32 mmol/L Final  . Glucose, Bld 05/19/2015 114* 65 - 99 mg/dL Final  . BUN 05/19/2015 25* 6 - 20 mg/dL Final  . Creatinine, Ser 05/19/2015 1.00  0.44 - 1.00 mg/dL Final  . Calcium 05/19/2015 11.2* 8.9 - 10.3 mg/dL Final  . Total Protein 05/19/2015 7.1  6.5 - 8.1 g/dL Final  . Albumin 05/19/2015 4.5  3.5 - 5.0 g/dL Final  . AST 05/19/2015 26  15 - 41 U/L Final  . ALT 05/19/2015 18  14 - 54 U/L Final  . Alkaline Phosphatase 05/19/2015 52  38 - 126 U/L Final  . Total Bilirubin 05/19/2015 0.9  0.3 - 1.2 mg/dL Final  . GFR calc non Af Amer 05/19/2015 53* >60 mL/min Final  . GFR calc Af Amer 05/19/2015 >60  >60 mL/min Final   Comment: (NOTE) The eGFR has been calculated using the CKD EPI equation. This calculation has not been validated in all clinical situations. eGFR's persistently <60 mL/min signify possible Chronic Kidney Disease.   . Anion gap 05/19/2015 6  5 - 15 Final  . LDH 05/19/2015 146  98 - 192 U/L Final      ASSESSMENT: Low-grade lymphoma patient has responded very well to bendamustine and Rituxan.(CLL versus SLL) At present time clinically patient is in complete remission We will continue observation. 2.  Old median mass being followed by Dr. Theora Gianotti most likely is due to lymphoma 3.  Hypercalcemia The patient has started taking calcium.  Recently.  Calcium level was normal throughout patient's past evaluation. She was advised to stop calcium tablets and recheck calcium level if it continues to be up then patient will be further evaluated rule out progressive lymphoma versus hyperparathyroidism  Lymphoma   Staging form: Lymphoid Neoplasms, AJCC 6th Edition     Clinical: No stage assigned - Unsigned   Forest Gleason, MD  05/19/2015 11:59 AM

## 2015-05-21 ENCOUNTER — Encounter: Payer: Self-pay | Admitting: Oncology

## 2015-06-06 ENCOUNTER — Encounter: Payer: Medicare Other | Admitting: Family Medicine

## 2015-06-07 ENCOUNTER — Ambulatory Visit (INDEPENDENT_AMBULATORY_CARE_PROVIDER_SITE_OTHER): Payer: Medicare Other | Admitting: Family Medicine

## 2015-06-07 VITALS — BP 112/68 | HR 84 | Resp 14 | Wt 149.0 lb

## 2015-06-07 DIAGNOSIS — C8590 Non-Hodgkin lymphoma, unspecified, unspecified site: Secondary | ICD-10-CM | POA: Diagnosis not present

## 2015-06-07 DIAGNOSIS — N3941 Urge incontinence: Secondary | ICD-10-CM

## 2015-06-07 DIAGNOSIS — E038 Other specified hypothyroidism: Secondary | ICD-10-CM | POA: Diagnosis not present

## 2015-06-07 DIAGNOSIS — I10 Essential (primary) hypertension: Secondary | ICD-10-CM

## 2015-06-07 DIAGNOSIS — E7800 Pure hypercholesterolemia, unspecified: Secondary | ICD-10-CM | POA: Diagnosis not present

## 2015-06-07 DIAGNOSIS — M25512 Pain in left shoulder: Secondary | ICD-10-CM

## 2015-06-07 MED ORDER — MELOXICAM 7.5 MG PO TABS: 7.5000 mg | ORAL_TABLET | Freq: Two times a day (BID) | ORAL | Status: DC

## 2015-06-07 MED ORDER — MIRABEGRON ER 25 MG PO TB24: 25.0000 mg | ORAL_TABLET | Freq: Every day | ORAL | Status: DC

## 2015-06-07 NOTE — Progress Notes (Signed)
Patient ID: Jacqueline Webb, female   DOB: 31-Oct-1940, 75 y.o.   MRN: [id number]    Subjective:  HPI  Patient is here for 6 months follow up.  Hypertension: Patient does not check her b/p regularly, when she thinks about it. No cardiac symptoms. BP Readings from Last 3 Encounters:  06/07/15 112/68  05/19/15 141/96  12/09/14 114/62   Hypothyroidism: last level and labs were done on 12/22/14. CBC and MetC was repeated in April. Lab Results  Component Value Date   TSH 0.624 12/22/2014   Lab Results  Component Value Date   CHOL 186 12/22/2014   HDL 74 12/22/2014   LDLCALC 99 12/22/2014   TRIG 64 12/22/2014   Patient has been following Dr. Oliva Bustard for lymphoma, she had labs done and her calcium was up but she took calcium supplements for 3 weeks prior to the lab draw and she was advised to stop. She is scheduled to have this repeated on June 26th and follow up with a new doctor in August since Dr. Oliva Bustard is leaving.  Prior to Admission medications   Medication Sig Start Date End Date Taking? Authorizing Provider  ASPIRIN EC LOW STRENGTH PO Take 1 tablet by mouth 1 day or 1 dose. 07/13/10  Yes Historical Provider, MD  levothyroxine (SYNTHROID, LEVOTHROID) 88 MCG tablet Take 1 tablet (88 mcg total) by mouth daily before breakfast. 12/22/14  Yes [name], MD  lisinopril-hydrochlorothiazide (PRINZIDE,ZESTORETIC) 20-12.5 MG tablet Take 1 tablet by mouth daily. 12/22/14  Yes Richard Maceo Pro., MD  Melatonin 1 MG CAPS Take 1 capsule by mouth at bedtime.   Yes Historical Provider, MD  ondansetron (ZOFRAN) 4 MG tablet  08/11/14  Yes Historical Provider, MD  simvastatin (ZOCOR) 40 MG tablet Take 40 mg by mouth every evening.   Yes Historical Provider, MD  solifenacin (VESICARE) 5 MG tablet Take 1 tablet by mouth 1 day or 1 dose. 04/05/14  Yes Historical Provider, MD    Patient Active Problem List   Diagnosis Date Noted  . Personal history of surgery to heart and great  vessels, presenting hazards to health 05/14/2014  . Raynaud's syndrome 05/14/2014  . Adult hypothyroidism 05/14/2014  . Hypercholesteremia 05/14/2014  . Coronary artery abnormality 05/14/2014  . Essential (primary) hypertension 05/14/2014  . Personal history of malignant neoplasm of breast 05/14/2014  . Lymphoma ([place]) 04/14/2014    Past Medical History  Diagnosis Date  . Hypertension   . Hypercholesteremia   . Lymphoma ([place]) 2007  . Benign neoplasm of rectum and anal canal   . 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 Marital Status: Married    Spouse Name: N/A  . Number of Children: N/A  . Years of Education: N/A   Occupational History  . Not on file.   Social History Main Topics  . Smoking status: Never Smoker   . Smokeless tobacco: Never Used  . Alcohol Use: 1.8 - 2.4 oz/week    3-4 Shots of liquor per week  . Drug Use: No  . Sexual Activity: No   Other Topics Concern  . Not on file   Social History Narrative    No Known Allergies  Review of Systems  Constitutional: Negative.   HENT: Negative.   Eyes: Negative.   Respiratory: Negative.   Cardiovascular: Positive for leg swelling. Negative for chest pain and palpitations.  Gastrointestinal: Negative.  Genitourinary: Positive for urgency.       Urinary incontinence.  Musculoskeletal: Positive for myalgias and joint pain.  Skin: Negative.   Neurological: Negative.     Immunization History  Administered Date(s) Administered  . Influenza, High Dose Seasonal PF 12/10/2014  . Pneumococcal Conjugate-13 12/31/2013  . Pneumococcal Polysaccharide-23 11/21/2010  . Td 10/09/2007   Objective:  BP 112/68 mmHg  Pulse 84  Resp 14  Wt 149 lb (67.586 kg)  Physical Exam  Constitutional: She is oriented to person, place, and time and well-developed, well-nourished, and in no distress.  HENT:  Head: Normocephalic and  atraumatic.  Right Ear: External ear normal.  Left Ear: External ear normal.  Nose: Nose normal.  Eyes: Conjunctivae are normal. Pupils are equal, round, and reactive to light.  Neck: Normal range of motion. Neck supple.  Cardiovascular: Normal rate, regular rhythm, normal heart sounds and intact distal pulses.   No murmur heard. Pulmonary/Chest: Effort normal and breath sounds normal. No respiratory distress. She has no wheezes.  Abdominal: Soft.  Musculoskeletal:  Slight Tenderness over the left trapezius  Neurological: She is alert and oriented to person, place, and time. Gait normal.  Skin: Skin is warm and dry.  Psychiatric: Mood, memory, affect and judgment normal.    Lab Results  Component Value Date   WBC 3.6 05/19/2015   HGB 14.6 05/19/2015   HCT 41.9 05/19/2015   PLT 145* 05/19/2015   GLUCOSE 114* 05/19/2015   CHOL 186 12/22/2014   TRIG 64 12/22/2014   HDL 74 12/22/2014   LDLCALC 99 12/22/2014   TSH 0.624 12/22/2014    CMP     Component Value Date/Time   NA 141 05/19/2015 1108   NA 141 05/11/2014 0853   NA 142 05/25/2013   K 3.8 05/19/2015 1108   K 3.6 05/11/2014 0853   CL 105 05/19/2015 1108   CL 103 05/11/2014 0853   CO2 30 05/19/2015 1108   CO2 29 05/11/2014 0853   GLUCOSE 114* 05/19/2015 1108   GLUCOSE 107* 05/11/2014 0853   BUN 25* 05/19/2015 1108   BUN 22* 05/11/2014 0853   BUN 27* 05/25/2013   CREATININE 1.00 05/19/2015 1108   CREATININE 1.07* 05/11/2014 0853   CREATININE 1.1 05/25/2013   CALCIUM 11.2* 05/19/2015 1108   CALCIUM 10.1 05/11/2014 0853   PROT 7.1 05/19/2015 1108   PROT 7.3 05/11/2014 0853   ALBUMIN 4.5 05/19/2015 1108   ALBUMIN 4.7 05/11/2014 0853   AST 26 05/19/2015 1108   AST 29 05/11/2014 0853   ALT 18 05/19/2015 1108   ALT 20 05/11/2014 0853   ALKPHOS 52 05/19/2015 1108   ALKPHOS 47 05/11/2014 0853   BILITOT 0.9 05/19/2015 1108   BILITOT 1.0 05/11/2014 0853   GFRNONAA 53* 05/19/2015 1108   GFRNONAA 51* 05/11/2014 0853    GFRNONAA 58* 12/31/2013 1052   GFRAA >60 05/19/2015 1108   GFRAA 59* 05/11/2014 0853   GFRAA >60 12/31/2013 1052    Assessment and Plan :  1. Essential (primary) hypertension Stable.  2. Other specified hypothyroidism Stable.  3. Hypercholesteremia Stable.  4. Lymphoma, unspecified body region, unspecified lymphoma type [facility]) Follows oncologist.  5. Left shoulder pain Will try Meloxicam and offered Xray. Advised patient just to take Meloxicam till the end of May and follow up as needed at that time. - DG Cervical Spine Complete; Future  6. Urge incontinence of urine/overactive bladder Worsening again. Patient was doing ok on Vesicare for a while but not anymore in the  past 2 months at least. Discussed options with patient. Will try Myrbetriq and if this fails patient will need to see urologist. Patient does not want to be referred at this time.  Patient was seen and examined by Dr. Eulas Post and note was scribed by Theressa Millard, RMA.    Miguel Aschoff MD [facility] 06/07/2015 3:22 PM

## 2015-06-14 ENCOUNTER — Telehealth: Payer: Self-pay | Admitting: Family Medicine

## 2015-06-14 NOTE — Telephone Encounter (Signed)
Pt was in last week to see Dr. [name] for neck pain.  He ask her if she wanted to get an xray but she said she would rather try medication first.  She took the medication but is still hurting.  She wants to know if someone can still set her up to have the xray.  She understands Dr. [name] is out of the office.  Her call back is [phone]  Thanks teri

## 2015-06-14 NOTE — Telephone Encounter (Signed)
Looks like there was an order put in for this on the day of the visit. Pt informed and instructed where to go for this.

## 2015-06-15 ENCOUNTER — Ambulatory Visit
Admission: RE | Admit: 2015-06-15 | Discharge: 2015-06-15 | Disposition: A | Discharge status: Home / Self Care | Payer: Medicare Other | Source: Ambulatory Visit | Attending: Family Medicine | Admitting: Family Medicine

## 2015-06-15 DIAGNOSIS — M25512 Pain in left shoulder: Secondary | ICD-10-CM | POA: Diagnosis not present

## 2015-06-15 DIAGNOSIS — M50322 Other cervical disc degeneration at C5-C6 level: Secondary | ICD-10-CM | POA: Diagnosis not present

## 2015-06-15 DIAGNOSIS — M50323 Other cervical disc degeneration at C6-C7 level: Secondary | ICD-10-CM | POA: Diagnosis not present

## 2015-06-15 DIAGNOSIS — M5032 Other cervical disc degeneration, mid-cervical region, unspecified level: Secondary | ICD-10-CM | POA: Diagnosis not present

## 2015-06-16 ENCOUNTER — Telehealth: Payer: Self-pay

## 2015-06-16 DIAGNOSIS — M5030 Other cervical disc degeneration, unspecified cervical region: Secondary | ICD-10-CM

## 2015-06-16 MED ORDER — ETODOLAC ER 400 MG PO TB24: 400.0000 mg | ORAL_TABLET | Freq: Every day | ORAL | Status: DC

## 2015-06-16 NOTE — Telephone Encounter (Signed)
Will switch to etodolac (another NSAID) to see if it offers better relief. Take daily with food. Med sent to [facility] in [place].

## 2015-06-16 NOTE — Telephone Encounter (Signed)
Before i call patient just wanted to make sure no other medications she is able to try?-aa

## 2015-06-16 NOTE — Telephone Encounter (Signed)
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 Most likely cause of pain.

## 2015-06-16 NOTE — Telephone Encounter (Signed)
Patient advised of results. Patient is still taking Meloxicam 7.5 mg 1 BID since May 16th. Pain has not improved much and wanted to see what else can she try and take or what the next step is. Patient [place] 2 [place] away just [place]. Please review. Thank you-aa

## 2015-06-16 NOTE — Telephone Encounter (Signed)
If interested she could consider PT. That can help a lot. Also if wanted we can refer her to a back orhtopedic close to where she is located for further treatment management.

## 2015-06-16 NOTE — Telephone Encounter (Signed)
Pt advised and she will try the new medication and will let us know if she wants to proceed with referral or PT-aa

## 2015-07-18 ENCOUNTER — Inpatient Hospital Stay: Payer: Medicare Other | Attending: Internal Medicine

## 2015-07-18 DIAGNOSIS — I480 Paroxysmal atrial fibrillation: Secondary | ICD-10-CM | POA: Diagnosis not present

## 2015-07-18 DIAGNOSIS — Z8572 Personal history of non-Hodgkin lymphomas: Secondary | ICD-10-CM | POA: Diagnosis not present

## 2015-07-18 DIAGNOSIS — I10 Essential (primary) hypertension: Secondary | ICD-10-CM | POA: Diagnosis not present

## 2015-07-18 DIAGNOSIS — C8590 Non-Hodgkin lymphoma, unspecified, unspecified site: Secondary | ICD-10-CM

## 2015-07-18 DIAGNOSIS — Z0000 Encounter for general adult medical examination without abnormal findings: Secondary | ICD-10-CM | POA: Diagnosis not present

## 2015-07-18 DIAGNOSIS — I25810 Atherosclerosis of coronary artery bypass graft(s) without angina pectoris: Secondary | ICD-10-CM | POA: Diagnosis not present

## 2015-07-18 DIAGNOSIS — E782 Mixed hyperlipidemia: Secondary | ICD-10-CM | POA: Diagnosis not present

## 2015-07-18 LAB — CALCIUM: CALCIUM: 10 mg/dL (ref 8.9–10.3)

## 2015-08-08 DIAGNOSIS — I480 Paroxysmal atrial fibrillation: Secondary | ICD-10-CM | POA: Diagnosis not present

## 2015-08-10 DIAGNOSIS — I10 Essential (primary) hypertension: Secondary | ICD-10-CM | POA: Diagnosis not present

## 2015-08-10 DIAGNOSIS — I25810 Atherosclerosis of coronary artery bypass graft(s) without angina pectoris: Secondary | ICD-10-CM | POA: Diagnosis not present

## 2015-08-10 DIAGNOSIS — I340 Nonrheumatic mitral (valve) insufficiency: Secondary | ICD-10-CM | POA: Insufficient documentation

## 2015-08-10 DIAGNOSIS — I480 Paroxysmal atrial fibrillation: Secondary | ICD-10-CM | POA: Diagnosis not present

## 2015-08-10 DIAGNOSIS — I4819 Other persistent atrial fibrillation: Secondary | ICD-10-CM | POA: Insufficient documentation

## 2015-08-10 DIAGNOSIS — I481 Persistent atrial fibrillation: Secondary | ICD-10-CM | POA: Diagnosis not present

## 2015-08-18 ENCOUNTER — Ambulatory Visit (INDEPENDENT_AMBULATORY_CARE_PROVIDER_SITE_OTHER): Payer: Medicare Other | Admitting: Family Medicine

## 2015-08-18 VITALS — BP 100/62 | HR 82 | Temp 98.2°F | Resp 14 | Wt 148.0 lb

## 2015-08-18 DIAGNOSIS — N3941 Urge incontinence: Secondary | ICD-10-CM

## 2015-08-18 DIAGNOSIS — I4819 Other persistent atrial fibrillation: Secondary | ICD-10-CM

## 2015-08-18 DIAGNOSIS — I481 Persistent atrial fibrillation: Secondary | ICD-10-CM | POA: Diagnosis not present

## 2015-08-18 DIAGNOSIS — C8590 Non-Hodgkin lymphoma, unspecified, unspecified site: Secondary | ICD-10-CM | POA: Diagnosis not present

## 2015-08-18 LAB — POCT URINALYSIS DIPSTICK
BILIRUBIN UA: NEGATIVE
Blood, UA: NEGATIVE
GLUCOSE UA: NEGATIVE
Ketones, UA: NEGATIVE
LEUKOCYTES UA: NEGATIVE
NITRITE UA: NEGATIVE
Protein, UA: NEGATIVE
Spec Grav, UA: 1.015
Urobilinogen, UA: 0.2
pH, UA: 6

## 2015-08-18 MED ORDER — MIRABEGRON ER 50 MG PO TB24: 50.0000 mg | ORAL_TABLET | Freq: Every day | ORAL | 5 refills | Status: DC

## 2015-08-18 NOTE — Progress Notes (Signed)
Subjective:  HPI  Patient is here for follow up for urinary incontinence. She was taking Vesicare and that did not help anymore, she is now on Myrbetriq and states at first it seemed to help but now she is having severe urinary incontinence again. She has not seen urologist for this yet.  Prior to Admission medications   Medication Sig Start Date End Date Taking? Authorizing Provider  etodolac (LODINE XL) 400 MG 24 hr tablet Take 1 tablet (400 mg total) by mouth daily. 06/16/15  Yes Mar Daring, PA-C  levothyroxine (SYNTHROID, LEVOTHROID) 88 MCG tablet Take 1 tablet (88 mcg total) by mouth daily before breakfast. 12/22/14  Yes [name], MD  lisinopril-hydrochlorothiazide (PRINZIDE,ZESTORETIC) 20-12.5 MG tablet Take 1 tablet by mouth daily. 12/22/14  Yes Richard Maceo Pro., MD  Melatonin 1 MG CAPS Take 1 capsule by mouth at bedtime.   Yes Historical Provider, MD  metoprolol succinate (TOPROL-XL) 25 MG 24 hr tablet  08/10/15  Yes Historical Provider, MD  mirabegron ER (MYRBETRIQ) 25 MG TB24 tablet Take 1 tablet (25 mg total) by mouth daily. 06/07/15  Yes Richard Maceo Pro., MD  XARELTO 20 MG TABS tablet  08/12/15  Yes Historical Provider, MD  ondansetron (ZOFRAN) 4 MG tablet  08/11/14   Historical Provider, MD    Patient Active Problem List   Diagnosis Date Noted  . Urge incontinence of urine 06/07/2015  . Personal history of surgery to heart and great vessels, presenting hazards to health 05/14/2014  . Raynaud's syndrome 05/14/2014  . Adult hypothyroidism 05/14/2014  . Hypercholesteremia 05/14/2014  . Coronary artery abnormality 05/14/2014  . Essential (primary) hypertension 05/14/2014  . Personal history of malignant neoplasm of breast 05/14/2014  . Lymphoma ([place]) 04/14/2014    Past Medical History:  Diagnosis Date  . Benign neoplasm of rectum and anal canal   . 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  Hypercholesteremia   . Hypertension   . Lymphoma ([place]) 2007    Social History   Social History  . Marital status: Married    Spouse name: N/A  . Number of children: N/A  . Years of education: N/A   Occupational History  . Not on file.   Social History Main Topics  . Smoking status: Never Smoker  . Smokeless tobacco: Never Used  . Alcohol use 1.8 - 2.4 oz/week    3 - 4 Shots of liquor per week  . Drug use: No  . Sexual activity: No   Other Topics Concern  . Not on file   Social History Narrative  . No narrative on file    No Known Allergies  Review of Systems  Constitutional: Negative.   HENT: Negative.   Eyes: Negative.   Respiratory: Negative.   Cardiovascular: Negative.   Gastrointestinal: Negative.   Genitourinary: Positive for frequency and urgency.  Musculoskeletal: Negative.   Skin: Negative.   Endo/Heme/Allergies: Negative.   Psychiatric/Behavioral: Negative.     Immunization History  Administered Date(s) Administered  . Influenza, High Dose Seasonal PF 12/10/2014  . Pneumococcal Conjugate-13 12/31/2013  . Pneumococcal Polysaccharide-23 11/21/2010  . Td 10/09/2007   Objective:  BP 100/62   Pulse 82   Temp 98.2 F (36.8 C)   Resp 14   Wt 148 lb (67.1 kg)   BMI 24.63 kg/m   Physical Exam  Constitutional: She is oriented to person, place, and time and well-developed, well-nourished, and in  no distress.  HENT:  Head: Normocephalic and atraumatic.  Eyes: Conjunctivae are normal. Pupils are equal, round, and reactive to light.  Neck: Normal range of motion. Neck supple.  Cardiovascular: Normal rate, regular rhythm, normal heart sounds and intact distal pulses.   No murmur heard. Pulmonary/Chest: Effort normal and breath sounds normal. No respiratory distress. She has no wheezes.  Abdominal: Soft. She exhibits no distension. There is no tenderness.  Musculoskeletal: Normal range of motion.  Neurological: She is alert and oriented to person,  place, and time. Gait normal.  Skin: Skin is warm and dry.  Psychiatric: Mood, memory, affect and judgment normal.    Lab Results  Component Value Date   WBC 3.6 05/19/2015   HGB 14.6 05/19/2015   HCT 41.9 05/19/2015   PLT 145 (L) 05/19/2015   GLUCOSE 114 (H) 05/19/2015   CHOL 186 12/22/2014   TRIG 64 12/22/2014   HDL 74 12/22/2014   LDLCALC 99 12/22/2014   TSH 0.624 12/22/2014    CMP     Component Value Date/Time   NA 141 05/19/2015 1108   NA 141 05/11/2014 0853   K 3.8 05/19/2015 1108   K 3.6 05/11/2014 0853   CL 105 05/19/2015 1108   CL 103 05/11/2014 0853   CO2 30 05/19/2015 1108   CO2 29 05/11/2014 0853   GLUCOSE 114 (H) 05/19/2015 1108   GLUCOSE 107 (H) 05/11/2014 0853   BUN 25 (H) 05/19/2015 1108   BUN 22 (H) 05/11/2014 0853   CREATININE 1.00 05/19/2015 1108   CREATININE 1.07 (H) 05/11/2014 0853   CALCIUM 10.0 07/18/2015 0950   CALCIUM 10.1 05/11/2014 0853   PROT 7.1 05/19/2015 1108   PROT 7.3 05/11/2014 0853   ALBUMIN 4.5 05/19/2015 1108   ALBUMIN 4.7 05/11/2014 0853   AST 26 05/19/2015 1108   AST 29 05/11/2014 0853   ALT 18 05/19/2015 1108   ALT 20 05/11/2014 0853   ALKPHOS 52 05/19/2015 1108   ALKPHOS 47 05/11/2014 0853   BILITOT 0.9 05/19/2015 1108   BILITOT 1.0 05/11/2014 0853   GFRNONAA 53 (L) 05/19/2015 1108   GFRNONAA 51 (L) 05/11/2014 0853   GFRAA >60 05/19/2015 1108   GFRAA 59 (L) 05/11/2014 0853    Assessment and Plan :  1. Urge incontinence of urine/OAB by description Not better. UA normal today. Will increase Myrbetriq to 50 mg and will refer to urologist for further plan of care. - POCT urinalysis dipstick - mirabegron ER (MYRBETRIQ) 50 MG TB24 tablet; Take 1 tablet (50 mg total) by mouth daily.  Dispense: 30 tablet; Refill: 5 - Ambulatory referral to Urology  2. Persistent atrial fibrillation (HCC) New. Rhythm normal today. Following Dr. Nehemiah Massed.  3. Lymphoma, unspecified body region, unspecified lymphoma type [facility]) Following  oncologist. 4.Breast Cancer 5.CAD Stable.RTC 3-4 months. Patient was seen and examined by Dr. Eulas Post and note was scribed by Theressa Millard, RMA.    Miguel Aschoff MD [facility] 08/18/2015 10:44 AM

## 2015-08-19 ENCOUNTER — Ambulatory Visit: Payer: Medicare Other | Admitting: Family Medicine

## 2015-09-08 ENCOUNTER — Encounter: Payer: Self-pay | Admitting: Urology

## 2015-09-08 ENCOUNTER — Ambulatory Visit (INDEPENDENT_AMBULATORY_CARE_PROVIDER_SITE_OTHER): Payer: Medicare Other | Admitting: Urology

## 2015-09-08 VITALS — BP 125/78 | HR 85 | Ht 66.0 in | Wt 148.7 lb

## 2015-09-08 DIAGNOSIS — N393 Stress incontinence (female) (male): Secondary | ICD-10-CM | POA: Diagnosis not present

## 2015-09-08 DIAGNOSIS — N952 Postmenopausal atrophic vaginitis: Secondary | ICD-10-CM | POA: Diagnosis not present

## 2015-09-08 DIAGNOSIS — IMO0002 Reserved for concepts with insufficient information to code with codable children: Secondary | ICD-10-CM

## 2015-09-08 DIAGNOSIS — N3941 Urge incontinence: Secondary | ICD-10-CM | POA: Diagnosis not present

## 2015-09-08 DIAGNOSIS — I481 Persistent atrial fibrillation: Secondary | ICD-10-CM | POA: Diagnosis not present

## 2015-09-08 DIAGNOSIS — IMO0001 Reserved for inherently not codable concepts without codable children: Secondary | ICD-10-CM

## 2015-09-08 DIAGNOSIS — I340 Nonrheumatic mitral (valve) insufficiency: Secondary | ICD-10-CM | POA: Diagnosis not present

## 2015-09-08 DIAGNOSIS — N8110 Cystocele, unspecified: Secondary | ICD-10-CM

## 2015-09-08 DIAGNOSIS — R32 Unspecified urinary incontinence: Secondary | ICD-10-CM

## 2015-09-08 DIAGNOSIS — I25810 Atherosclerosis of coronary artery bypass graft(s) without angina pectoris: Secondary | ICD-10-CM | POA: Diagnosis not present

## 2015-09-08 DIAGNOSIS — E782 Mixed hyperlipidemia: Secondary | ICD-10-CM | POA: Diagnosis not present

## 2015-09-08 LAB — BLADDER SCAN AMB NON-IMAGING: SCAN RESULT: 0

## 2015-09-08 MED ORDER — ESTROGENS, CONJUGATED 0.625 MG/GM VA CREA: 1.0000 | TOPICAL_CREAM | Freq: Every day | VAGINAL | 12 refills | Status: DC

## 2015-09-08 MED ORDER — ESTRADIOL 0.1 MG/GM VA CREA
TOPICAL_CREAM | VAGINAL | 12 refills | Status: DC
Start: 2015-09-08 — End: 2015-10-03

## 2015-09-08 NOTE — Patient Instructions (Signed)
  I have given you two prescriptions for a vaginal estrogen cream.  Estrace and Premarin.  Please take these to your pharmacy and see which one your insurance covers.  If both are too expensive, please call the office at [phone] for an alternative.  You are given a sample of vaginal estrogen cream (Estrace) and instructed to apply 0.5mg  (pea-sized amount)  just inside the vaginal introitus with a finger-tip every night for two weeks.

## 2015-09-08 NOTE — Progress Notes (Signed)
09/08/2015 2:27 PM   Jacqueline Webb Jul 21, 1940 [id number]  Referring provider: [name], MD [address] [place], [place] [address]  Chief Complaint  Patient presents with  . New Patient (Initial Visit)    urge incontinence     HPI: Patient is a 75 year old Caucasian female who is referred by Dr. [name] for urge incontinence.    Patient states she has been suffering with incontinence for over a year.  It was initially controlled with Vesicare, but she recently had worsening of her symptoms.  She was changed to Myrbetriq 25 mg one month ago without improvement and then the dose was increased to 50 mg while she waited for her appointment with [name].  She has not noticed any improvement with the increase of dose.    She states the incontinence happens when she rises to a standing position from a sitting position.  She also has had some incidences of wetting herself when she was unaware of voiding.  She is wearing 3 to 4 pads daily and they are usually soaked.  She is also wearing dark clothes in an effort to conceal accidents.  She makes several trips to the bathroom during the day.    She has nocturia x 2, but she states she feels the urges to urinate during the night more often than twice.    Her fluid consumption is mostly water.  She golf's and will drink Gatorade during golf games.  She drinks 2 to 3 cups of decaffeinated coffees daily.    She does not have a history of recurrent UTI's, stones or gross hematuria.  She is not having pain with urination, bladder fullness or bladder emptying.    Her PVR today is 0 mL.    Her sister, who lives in [place], had an indwelling foley placed for her urinary issues.  Patient is fearful that she is heading in that direction.    PMH: Past Medical History:  Diagnosis Date  . Benign neoplasm of rectum and anal canal   . 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  Hypercholesteremia   . Hypertension   . Lymphoma ([place]) 2007    Surgical History: Past Surgical History:  Procedure Laterality Date  . ABDOMINAL HYSTERECTOMY  1958  . APPENDECTOMY    . BREAST BIOPSY Right 1970   right breast biopsy with clip  . BREAST BIOPSY Left 2002   left breast biopsy with clip  . BREAST BIOPSY Right 2014   right breast biopsy with clip  . BREAST CYST ASPIRATION Bilateral 2000   bilateral fine needle aspiration  . BREAST SURGERY Left 1990   biopsy  . BREAST SURGERY Right May 08, 2012   complex fibroadenoma without malignancy.  [name] CARDIAC CATHETERIZATION  2014  . COLONOSCOPY  2009, 2012    hyperplastic rectal polyp 2012 tubular adenoma of proximal ascending colon 2000 9 repeat exam due to 2017.  [name] CORONARY ARTERY BYPASS GRAFT  1999  . FLEXIBLE SIGMOIDOSCOPY  1998    Home Medications:    Medication List       Accurate as of 09/08/15  2:27 PM. Always use your most recent med list.          conjugated estrogens vaginal cream Commonly known as:  PREMARIN Place 1 Applicatorful vaginally daily. Apply 0.5mg  (pea-sized amount)  just inside the vaginal introitus with a finger-tip every night for two weeks and then  Monday, Wednesday and Friday nights.   estradiol 0.1 MG/GM vaginal cream Commonly known as:  ESTRACE VAGINAL Apply 0.5mg  (pea-sized amount)  just inside the vaginal introitus with a finger-tip every night for two weeks and then Monday, Wednesday and Friday nights.   etodolac 400 MG 24 hr tablet Commonly known as:  LODINE XL Take 1 tablet (400 mg total) by mouth daily.   levothyroxine 88 MCG tablet Commonly known as:  SYNTHROID, LEVOTHROID Take 1 tablet (88 mcg total) by mouth daily before breakfast.   lisinopril-hydrochlorothiazide 20-12.5 MG tablet Commonly known as:  PRINZIDE,ZESTORETIC Take 1 tablet by mouth daily.   Melatonin 1 MG Caps Take 1 capsule by mouth at bedtime.   metoprolol succinate 25 MG 24 hr tablet Commonly known as:   TOPROL-XL   mirabegron ER 50 MG Tb24 tablet Commonly known as:  MYRBETRIQ Take 1 tablet (50 mg total) by mouth daily.   XARELTO 20 MG Tabs tablet Generic drug:  rivaroxaban       Allergies: No Known Allergies  Family History: Family History  Problem Relation Age of Onset  . Lung cancer Father     Died age 65  . Heart disease Mother     Died age 84  . Asthma Mother   . CAD Brother     CABG age 26, died age 69  . Heart attack Sister   . Tuberculosis Sister     65 years old  . Lung cancer Sister     Died in her 33s  . Angina Sister   . Breast cancer Sister   . Rheum arthritis Sister   . Aneurysm Sister     Brain, died age 83  . Angina Sister   . Heart disease Brother     Stent placement  . Heart disease Brother     stent placement  . Cancer Other     breast cancer niece, pancreatic cancer niece  . Breast cancer Other     Social History:  reports that she has never smoked. She has never used smokeless tobacco. She reports that she drinks about 1.8 - 2.4 oz of alcohol per week . She reports that she does not use drugs.  ROS: UROLOGY Frequent Urination?: Yes Hard to postpone urination?: Yes Burning/pain with urination?: No Get up at night to urinate?: Yes Leakage of urine?: Yes Urine stream starts and stops?: No Trouble starting stream?: No Do you have to strain to urinate?: No Blood in urine?: No Urinary tract infection?: No Sexually transmitted disease?: No Injury to kidneys or bladder?: No Painful intercourse?: No Weak stream?: No Currently pregnant?: No Vaginal bleeding?: No Last menstrual period?: No  Gastrointestinal Nausea?: No Vomiting?: No Indigestion/heartburn?: No Diarrhea?: No Constipation?: No  Constitutional Fever: No Night sweats?: No Weight loss?: No Fatigue?: No  Skin Skin rash/lesions?: No Itching?: No  Eyes Blurred vision?: No Double vision?: No  Ears/Nose/Throat Sore throat?: No Sinus problems?:  No  Hematologic/Lymphatic Swollen glands?: No Easy bruising?: No  Cardiovascular Leg swelling?: Yes Chest pain?: No  Respiratory Cough?: No Shortness of breath?: No  Endocrine Excessive thirst?: No  Musculoskeletal Back pain?: No Joint pain?: No  Neurological Headaches?: No Dizziness?: No  Psychologic Depression?: No Anxiety?: No  Physical Exam: BP 125/78   Pulse 85   Ht 5\' 6"  (1.676 m)   Wt 148 lb 11.2 oz (67.4 kg)   BMI 24.00 kg/m   Constitutional: Well nourished. Alert and oriented, No acute distress. HEENT:  AT, moist mucus membranes.  Trachea midline, no masses. Cardiovascular: No clubbing, cyanosis, or edema. Respiratory: Normal respiratory effort, no increased work of breathing. GI: Abdomen is soft, non tender, non distended, no abdominal masses. Liver and spleen not palpable.  No hernias appreciated.  Stool sample for occult testing is not indicated.   GU: No CVA tenderness.  No bladder fullness or masses.  Atrophic external genitalia, normal pubic hair distribution, no lesions.  Normal urethral meatus, no lesions, no prolapse, no discharge.   Urethral caruncle is noted.  No bladder fullness, tenderness or masses. Pale vagina mucosa, poor estrogen effect, no discharge, no lesions, good pelvic support, Grade II cystocele is noted. No rectocele is noted.  Cervix and uterus are surgically absent.  No adnexal/parametria masses or tenderness noted.  Anus and perineum are without rashes or lesions.    Skin: No rashes, bruises or suspicious lesions. Lymph: No cervical or inguinal adenopathy. Neurologic: Grossly intact, no focal deficits, moving all 4 extremities. Psychiatric: Normal mood and affect.  Laboratory Data: Lab Results  Component Value Date   WBC 3.6 05/19/2015   HGB 14.6 05/19/2015   HCT 41.9 05/19/2015   MCV 90.3 05/19/2015   PLT 145 (L) 05/19/2015    Lab Results  Component Value Date   CREATININE 1.00 05/19/2015    Lab Results  Component  Value Date   TSH 0.624 12/22/2014       Component Value Date/Time   CHOL 186 12/22/2014 0907   HDL 74 12/22/2014 0907   LDLCALC 99 12/22/2014 0907    Lab Results  Component Value Date   AST 26 05/19/2015   Lab Results  Component Value Date   ALT 18 05/19/2015     Pertinent Imaging: Results for AHAANA, DIGLIO (MRN [id number]) as of 09/08/2015 14:06  Ref. Range 09/08/2015 14:03  Scan Result Unknown 0    Assessment & Plan:    1. Incontinence  - failed medical therapy with beta-3 adrenergic receptor agonist and anticholinergics  - offered behavioral therapies; bladder training, bladder control strategies, pelvic floor muscle training  - offered refer to gynecology for a pessary fitting-she would like a referral for pessary fitting  - offered an appointment with one of our surgeons for further evaluation for a bladder surgery  - Bladder Scan (Post Void Residual) in office  - refer to gynecology for pessary fitting  - follow up after pessary fitting to assess symptoms as the urgency may worsen  2. Vaginal atrophy  - I explained to the patient that when women go through menopause and her estrogen levels are severely diminished, the normal vaginal mucosa will change.   Because of this, the vaginal canal can narrow and the bladder can become more irritable.   Patient was given a sample of vaginal estrogen cream (Estrace) and instructed to apply 0.5mg  (pea-sized amount)  just inside the vaginal introitus with a finger-tip every night for two weeks and then Monday, Wednesday and Friday nights.  I explained to the patient that vaginally administered estrogen, which causes only a slight increase in the blood estrogen levels, have fewer contraindications and adverse systemic effects that oral HT. I have also given prescriptions for the Estrace cream and Premarin cream, so that the patient may carry them to the pharmacy to see which one of the branded creams would be most economical for  her.  She will return after pessary fitting for symptom recheck, exam and report if she was available to require the vaginal cream by prescription.  3. Cystocele  -  see Incontinence  Return for Follow up after pessary fitting.  These notes generated with voice recognition software. I apologize for typographical errors.  Zara Council, [facility] [address], [place],  [id number] [phone]

## 2015-09-19 ENCOUNTER — Inpatient Hospital Stay: Payer: Medicare Other

## 2015-09-19 ENCOUNTER — Inpatient Hospital Stay: Payer: Medicare Other | Admitting: Internal Medicine

## 2015-09-30 ENCOUNTER — Other Ambulatory Visit: Payer: Self-pay

## 2015-09-30 DIAGNOSIS — C8590 Non-Hodgkin lymphoma, unspecified, unspecified site: Secondary | ICD-10-CM

## 2015-10-03 ENCOUNTER — Inpatient Hospital Stay: Payer: Medicare Other | Attending: Internal Medicine

## 2015-10-03 ENCOUNTER — Inpatient Hospital Stay (HOSPITAL_BASED_OUTPATIENT_CLINIC_OR_DEPARTMENT_OTHER): Payer: Medicare Other | Admitting: Internal Medicine

## 2015-10-03 ENCOUNTER — Encounter: Payer: Self-pay | Admitting: Internal Medicine

## 2015-10-03 DIAGNOSIS — E7800 Pure hypercholesterolemia, unspecified: Secondary | ICD-10-CM | POA: Diagnosis not present

## 2015-10-03 DIAGNOSIS — Z9221 Personal history of antineoplastic chemotherapy: Secondary | ICD-10-CM | POA: Diagnosis not present

## 2015-10-03 DIAGNOSIS — Z7901 Long term (current) use of anticoagulants: Secondary | ICD-10-CM | POA: Insufficient documentation

## 2015-10-03 DIAGNOSIS — I10 Essential (primary) hypertension: Secondary | ICD-10-CM | POA: Insufficient documentation

## 2015-10-03 DIAGNOSIS — Z8572 Personal history of non-Hodgkin lymphomas: Secondary | ICD-10-CM | POA: Insufficient documentation

## 2015-10-03 DIAGNOSIS — Z79899 Other long term (current) drug therapy: Secondary | ICD-10-CM | POA: Insufficient documentation

## 2015-10-03 DIAGNOSIS — Z801 Family history of malignant neoplasm of trachea, bronchus and lung: Secondary | ICD-10-CM

## 2015-10-03 DIAGNOSIS — D128 Benign neoplasm of rectum: Secondary | ICD-10-CM | POA: Insufficient documentation

## 2015-10-03 DIAGNOSIS — C8308 Small cell B-cell lymphoma, lymph nodes of multiple sites: Secondary | ICD-10-CM

## 2015-10-03 DIAGNOSIS — C8590 Non-Hodgkin lymphoma, unspecified, unspecified site: Secondary | ICD-10-CM

## 2015-10-03 LAB — CBC WITH DIFFERENTIAL/PLATELET
BASOS ABS: 0 10*3/uL (ref 0–0.1)
BASOS PCT: 1 %
EOS ABS: 0.1 10*3/uL (ref 0–0.7)
Eosinophils Relative: 3 %
HCT: 39 % (ref 35.0–47.0)
HEMOGLOBIN: 13.5 g/dL (ref 12.0–16.0)
LYMPHS ABS: 1 10*3/uL (ref 1.0–3.6)
Lymphocytes Relative: 26 %
MCH: 31.4 pg (ref 26.0–34.0)
MCHC: 34.7 g/dL (ref 32.0–36.0)
MCV: 90.4 fL (ref 80.0–100.0)
Monocytes Absolute: 0.3 10*3/uL (ref 0.2–0.9)
Monocytes Relative: 8 %
NEUTROS PCT: 62 %
Neutro Abs: 2.5 10*3/uL (ref 1.4–6.5)
Platelets: 139 10*3/uL — ABNORMAL LOW (ref 150–440)
RBC: 4.32 MIL/uL (ref 3.80–5.20)
RDW: 15.1 % — ABNORMAL HIGH (ref 11.5–14.5)
WBC: 3.9 10*3/uL (ref 3.6–11.0)

## 2015-10-03 LAB — COMPREHENSIVE METABOLIC PANEL
ALBUMIN: 4.1 g/dL (ref 3.5–5.0)
ALK PHOS: 58 U/L (ref 38–126)
ALT: 27 U/L (ref 14–54)
AST: 30 U/L (ref 15–41)
Anion gap: 5 (ref 5–15)
BUN: 24 mg/dL — AB (ref 6–20)
CALCIUM: 9.5 mg/dL (ref 8.9–10.3)
CHLORIDE: 105 mmol/L (ref 101–111)
CO2: 26 mmol/L (ref 22–32)
CREATININE: 1.05 mg/dL — AB (ref 0.44–1.00)
GFR calc Af Amer: 58 mL/min — ABNORMAL LOW (ref >60)
GFR calc non Af Amer: 50 mL/min — ABNORMAL LOW (ref >60)
GLUCOSE: 102 mg/dL — AB (ref 65–99)
Potassium: 3.8 mmol/L (ref 3.5–5.1)
SODIUM: 136 mmol/L (ref 135–145)
Total Bilirubin: 1 mg/dL (ref 0.3–1.2)
Total Protein: 6.7 g/dL (ref 6.5–8.1)

## 2015-10-03 NOTE — Progress Notes (Signed)
[place] OFFICE PROGRESS NOTE  Patient Care Team: [name], MD as PCP - General (Unknown Physician Specialty) Robert Bellow, MD (General Surgery) Forest Gleason, MD (Oncology)  Lymphoma [facility])   Staging form: Lymphoid Neoplasms, AJCC 6th Edition   - Clinical: No stage assigned - Unsigned   Oncology History   1. Lymphoma low-grade, status Rituxan therapy. 2. Recurrent disease by clinical examination in December of 2012 3. Ovarian mass on PET scan in December of 2012 (normal CA 125). Ovarian mass has resolved after rituximab therapy. 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  Patient had total 5 cycles of chemotherapy with bendamustine and rituximab and 6 cycle was omitted because of significant side effects with and weakness and fatigue.  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 I reviewed the patient's prior charts/pertinent labs/imaging in detail; findings are summarized above.     INTERVAL HISTORY:  Jacqueline Webb 75 y.o.  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  Patient denies any new lumps or bumps. Appetite is good. No weight loss. No night sweats.   REVIEW OF SYSTEMS:  A complete 10 point review of system is done which is negative except mentioned  above/history of present illness.   PAST MEDICAL HISTORY :  Past Medical History:  Diagnosis Date  . Benign neoplasm of rectum and anal canal   . 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 Hypercholesteremia   . Hypertension   . Lymphoma ([place]) 2007    PAST SURGICAL HISTORY :   Past Surgical History:  Procedure Laterality Date  . ABDOMINAL HYSTERECTOMY  1958  . APPENDECTOMY    . BREAST BIOPSY Right 1970   right breast biopsy with clip  . BREAST BIOPSY Left 2002   left breast biopsy with clip  . BREAST BIOPSY Right 2014   right breast biopsy with clip  . BREAST CYST ASPIRATION Bilateral 2000   bilateral fine needle aspiration  . BREAST SURGERY Left 1990   biopsy  . BREAST SURGERY Right May 08, 2012   complex fibroadenoma without malignancy.  [name] CARDIAC CATHETERIZATION  2014  . COLONOSCOPY  2009, 2012    hyperplastic rectal polyp 2012 tubular adenoma of proximal ascending colon 2000 9 repeat exam due to 2017.  [name] CORONARY ARTERY BYPASS GRAFT  1999  . FLEXIBLE SIGMOIDOSCOPY  1998    FAMILY HISTORY :   Family History  Problem Relation Age of Onset  . Lung cancer Father     Died age 66  . Heart disease Mother     Died age 21  . Asthma Mother   . CAD Brother     CABG age 67, died age 29  . Heart attack Sister   .  Tuberculosis Sister     78 years old  . Lung cancer Sister     Died in her 67s  . Angina Sister   . Breast cancer Sister   . Rheum arthritis Sister   . Aneurysm Sister     Brain, died age 42  . Angina Sister   . Heart disease Brother     Stent placement  . Heart disease Brother     stent placement  . Cancer Other     breast cancer niece, pancreatic cancer niece  . Breast cancer Other     SOCIAL HISTORY:   Social History  Substance Use Topics  . Smoking status: Never Smoker  . Smokeless tobacco: Never Used  . Alcohol use 1.8 - 2.4 oz/week    3 - 4 Shots of liquor per week    ALLERGIES:  has No  Known Allergies.  MEDICATIONS:  Current Outpatient Prescriptions  Medication Sig Dispense Refill  . conjugated estrogens (PREMARIN) vaginal cream Place 1 Applicatorful vaginally daily. Apply 0.5mg  (pea-sized amount)  just inside the vaginal introitus with a finger-tip every night for two weeks and then Monday, Wednesday and Friday nights. 30 g 12  . levothyroxine (SYNTHROID, LEVOTHROID) 88 MCG tablet Take 1 tablet (88 mcg total) by mouth daily before breakfast. 90 tablet 3  . lisinopril-hydrochlorothiazide (PRINZIDE,ZESTORETIC) 20-12.5 MG tablet Take 1 tablet by mouth daily. 90 tablet 3  . Melatonin 1 MG CAPS Take 1 capsule by mouth at bedtime.    . metoprolol succinate (TOPROL-XL) 25 MG 24 hr tablet     . mirabegron ER (MYRBETRIQ) 50 MG TB24 tablet Take 1 tablet (50 mg total) by mouth daily. 30 tablet 5  . XARELTO 20 MG TABS tablet      No current facility-administered medications for this visit.     PHYSICAL EXAMINATION: ECOG PERFORMANCE STATUS: 0 - Asymptomatic  BP 125/84 (BP Location: Left Arm, Patient Position: Sitting)   Pulse 79   Temp (!) 96.8 F (36 C) (Tympanic)   Resp 16   Ht 5\' 6"  (1.676 m)   Wt 148 lb 12.8 oz (67.5 kg)   BMI 24.02 kg/m   Filed Weights   10/03/15 1133  Weight: 148 lb 12.8 oz (67.5 kg)    GENERAL: Well-nourished well-developed; Alert, no distress and comfortable.  Accompanied by her husband. EYES: no pallor or icterus OROPHARYNX: no thrush or ulceration; good dentition  NECK: supple, no masses felt LYMPH:  no palpable lymphadenopathy in the cervical, axillary or inguinal regions LUNGS: clear to auscultation and  No wheeze or crackles HEART/CVS: regular rate & rhythm and no murmurs; No lower extremity edema ABDOMEN:abdomen soft, non-tender and normal bowel sounds Musculoskeletal:no cyanosis of digits and no clubbing  PSYCH: alert & oriented x 3 with fluent speech NEURO: no focal motor/sensory deficits SKIN:  no rashes or significant  lesions  LABORATORY DATA:  I have reviewed the data as listed    Component Value Date/Time   NA 136 10/03/2015 1114   NA 141 05/11/2014 0853   K 3.8 10/03/2015 1114   K 3.6 05/11/2014 0853   CL 105 10/03/2015 1114   CL 103 05/11/2014 0853   CO2 26 10/03/2015 1114   CO2 29 05/11/2014 0853   GLUCOSE 102 (H) 10/03/2015 1114   GLUCOSE 107 (H) 05/11/2014 0853   BUN 24 (H) 10/03/2015 1114   BUN 22 (H) 05/11/2014 0853   CREATININE 1.05 (H) 10/03/2015 1114   CREATININE 1.07 (H) 05/11/2014 0853   CALCIUM  9.5 10/03/2015 1114   CALCIUM 10.1 05/11/2014 0853   PROT 6.7 10/03/2015 1114   PROT 7.3 05/11/2014 0853   ALBUMIN 4.1 10/03/2015 1114   ALBUMIN 4.7 05/11/2014 0853   AST 30 10/03/2015 1114   AST 29 05/11/2014 0853   ALT 27 10/03/2015 1114   ALT 20 05/11/2014 0853   ALKPHOS 58 10/03/2015 1114   ALKPHOS 47 05/11/2014 0853   BILITOT 1.0 10/03/2015 1114   BILITOT 1.0 05/11/2014 0853   GFRNONAA 50 (L) 10/03/2015 1114   GFRNONAA 51 (L) 05/11/2014 0853   GFRAA 58 (L) 10/03/2015 1114   GFRAA 59 (L) 05/11/2014 0853    No results found for: SPEP, UPEP  Lab Results  Component Value Date   WBC 3.9 10/03/2015   NEUTROABS 2.5 10/03/2015   HGB 13.5 10/03/2015   HCT 39.0 10/03/2015   MCV 90.4 10/03/2015   PLT 139 (L) 10/03/2015      Chemistry      Component Value Date/Time   NA 136 10/03/2015 1114   NA 141 05/11/2014 0853   K 3.8 10/03/2015 1114   K 3.6 05/11/2014 0853   CL 105 10/03/2015 1114   CL 103 05/11/2014 0853   CO2 26 10/03/2015 1114   CO2 29 05/11/2014 0853   BUN 24 (H) 10/03/2015 1114   BUN 22 (H) 05/11/2014 0853   CREATININE 1.05 (H) 10/03/2015 1114   CREATININE 1.07 (H) 05/11/2014 0853   GLU 98 05/25/2013      Component Value Date/Time   CALCIUM 9.5 10/03/2015 1114   CALCIUM 10.1 05/11/2014 0853   ALKPHOS 58 10/03/2015 1114   ALKPHOS 47 05/11/2014 0853   AST 30 10/03/2015 1114   AST 29 05/11/2014 0853   ALT 27 10/03/2015 1114   ALT 20 05/11/2014 0853    BILITOT 1.0 10/03/2015 1114   BILITOT 1.0 05/11/2014 0853       RADIOGRAPHIC STUDIES: I have personally reviewed the radiological images as listed and agreed with the findings in the report. No results found.   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  # Clinically no evidence of recurrence. CBC CMP and LDH normal. Recommend follow-up PET scan in 6 months.  # Elevated calcium- today calcium is normal.  # Above plan of care was discussed with the patient and family in detail. They agree.   Orders Placed This Encounter  Procedures  . NM PET Image Restag (PS) Skull Base To Thigh    Standing Status:   Future    Standing Expiration Date:   12/02/2016    Order Specific Question:   Reason for Exam (SYMPTOM  OR DIAGNOSIS REQUIRED)    Answer:   lymphoma    Order Specific Question:   Preferred imaging location?    Answer:   [facility]  . Comprehensive metabolic panel    Standing Status:   Future    Standing Expiration Date:   10/02/2016  . CBC with Differential    Standing Status:   Future    Standing Expiration Date:   10/02/2016  . Lactate dehydrogenase    Standing Status:   Future    Standing Expiration Date:   10/02/2016   All questions were answered. The patient knows to call the clinic with any problems, questions or concerns.      Lenetta Quaker R  Rogue Bussing, MD 10/03/2015 12:55 PM

## 2015-10-03 NOTE — Assessment & Plan Note (Addendum)
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  # Clinically no evidence of recurrence. CBC CMP and LDH normal. Recommend follow-up PET scan in 6 months.  # Elevated calcium- today calcium is normal.  # Above plan of care was discussed with the patient and family in detail. They agree.

## 2015-10-19 ENCOUNTER — Encounter: Payer: Self-pay | Admitting: Obstetrics and Gynecology

## 2015-10-19 ENCOUNTER — Ambulatory Visit (INDEPENDENT_AMBULATORY_CARE_PROVIDER_SITE_OTHER): Payer: Medicare Other | Admitting: Obstetrics and Gynecology

## 2015-10-19 VITALS — BP 115/77 | HR 109 | Ht 66.0 in | Wt 150.3 lb

## 2015-10-19 DIAGNOSIS — N8110 Cystocele, unspecified: Secondary | ICD-10-CM

## 2015-10-19 DIAGNOSIS — Z90710 Acquired absence of both cervix and uterus: Secondary | ICD-10-CM | POA: Diagnosis not present

## 2015-10-19 DIAGNOSIS — IMO0002 Reserved for concepts with insufficient information to code with codable children: Secondary | ICD-10-CM | POA: Insufficient documentation

## 2015-10-19 DIAGNOSIS — N952 Postmenopausal atrophic vaginitis: Secondary | ICD-10-CM | POA: Diagnosis not present

## 2015-10-19 DIAGNOSIS — N3941 Urge incontinence: Secondary | ICD-10-CM | POA: Diagnosis not present

## 2015-10-19 MED ORDER — ESTRADIOL 2 MG VA RING: 2.0000 mg | VAGINAL_RING | VAGINAL | 0 refills | Status: DC

## 2015-10-19 NOTE — Patient Instructions (Signed)
1. Discontinue Premarin cream 2. Inserted Estring vaginal insert 3. Return in 12 weeks for follow-up 4. Consider Go Girl cup for emergencies

## 2015-10-19 NOTE — Progress Notes (Signed)
GYN ENCOUNTER NOTE  Subjective:       Jacqueline Webb is a 75 y.o. G7P1102 female is here for gynecologic evaluation of the following issues:  1. Urinary incontinence 2. Cystocele 3. Vaginal atrophy  Patient is status post total abdominal hysterectomy.   Just recently started on Premarin vaginal cream. Experiences spontaneous urinary incontinence and urgency without stress urinary incontinence symptoms. Not sexually active.   Gynecologic History No LMP recorded. Patient has had a hysterectomy. Status post TAH Contraception: status post hysterectomy  Obstetric History OB History  Gravida Para Term Preterm AB Living  2 2 1 1   2   SAB TAB Ectopic Multiple Live Births          2    # Outcome Date GA Lbr Len/2nd Weight Sex Delivery Anes PTL Lv  2 Preterm 1965   5 lb 9.6 oz (2.54 kg) F Vag-Spont   LIV  1 Term 1963   5 lb 1.8 oz (2.318 kg) M Vag-Spont   LIV      Past Medical History:  Diagnosis Date  . A-fib ([place])   . Benign neoplasm of rectum and anal canal   . 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 Hypercholesteremia   . Hypertension   . Hyperthyroidism   . Lymphoma ([place]) 2007    Past Surgical History:  Procedure Laterality Date  . ABDOMINAL HYSTERECTOMY  1958  . APPENDECTOMY    . BREAST BIOPSY Right 1970   right breast biopsy with clip  . BREAST BIOPSY Left 2002   left breast biopsy with clip  . BREAST BIOPSY Right 2014   right breast biopsy with clip  . BREAST CYST ASPIRATION Bilateral 2000   bilateral fine needle aspiration  . BREAST SURGERY Left 1990   biopsy  . BREAST SURGERY Right May 08, 2012   complex fibroadenoma without malignancy.  [name] CARDIAC CATHETERIZATION  2014  . COLONOSCOPY  2009, 2012    hyperplastic rectal polyp 2012 tubular adenoma of proximal ascending colon 2000 9 repeat exam due to 2017.  [name] CORONARY ARTERY BYPASS GRAFT  1999  . [place]    Current Outpatient  Prescriptions on File Prior to Visit  Medication Sig Dispense Refill  . conjugated estrogens (PREMARIN) vaginal cream Place 1 Applicatorful vaginally daily. Apply 0.5mg  (pea-sized amount)  just inside the vaginal introitus with a finger-tip every night for two weeks and then Monday, Wednesday and Friday nights. 30 g 12  . levothyroxine (SYNTHROID, LEVOTHROID) 88 MCG tablet Take 1 tablet (88 mcg total) by mouth daily before breakfast. 90 tablet 3  . lisinopril-hydrochlorothiazide (PRINZIDE,ZESTORETIC) 20-12.5 MG tablet Take 1 tablet by mouth daily. 90 tablet 3  . Melatonin 1 MG CAPS Take 1 capsule by mouth at bedtime.    . metoprolol succinate (TOPROL-XL) 25 MG 24 hr tablet     . mirabegron ER (MYRBETRIQ) 50 MG TB24 tablet Take 1 tablet (50 mg total) by mouth daily. 30 tablet 5  . XARELTO 20 MG TABS tablet      No current facility-administered medications on file prior to visit.     No Known Allergies  Social History   Social History  . Marital status: Married    Spouse name: N/A  . Number of children: N/A  . Years of education: N/A   Occupational History  . Not on file.   Social History Main Topics  . Smoking status: Never Smoker  . Smokeless  tobacco: Never Used  . Alcohol use 1.8 - 2.4 oz/week    3 - 4 Shots of liquor per week     Comment: daily-   . Drug use: No  . Sexual activity: No   Other Topics Concern  . Not on file   Social History Narrative  . No narrative on file    Family History  Problem Relation Age of Onset  . Lung cancer Father     Died age 28  . Heart disease Mother     Died age 74  . Asthma Mother   . CAD Brother     CABG age 66, died age 1  . Heart attack Sister   . Tuberculosis Sister     4 years old  . Lung cancer Sister     Died in her 64s  . Angina Sister   . Breast cancer Sister   . Rheum arthritis Sister   . Aneurysm Sister     Brain, died age 57  . Angina Sister   . Heart disease Brother     Stent placement  . Heart disease  Brother     stent placement  . Cancer Other     breast cancer niece, pancreatic cancer niece  . Breast cancer Other   . Ovarian cancer Neg Hx   . Diabetes Neg Hx     The following portions of the patient's history were reviewed and updated as appropriate: allergies, current medications, past family history, past medical history, past social history, past surgical history and problem list.  Review of Systems Review of Systems - per history of present illness Objective:   BP 115/77   Pulse (!) 109   Ht 5\' 6"  (1.676 m)   Wt 150 lb 4.8 oz (68.2 kg)   BMI 24.26 kg/m  CONSTITUTIONAL: Well-developed, well-nourished female in no acute distress.  HENT:  Normocephalic, atraumatic.  NECK: Not examined SKIN: Skin is warm and dry. No rash noted. Not diaphoretic. No erythema. No pallor. [place]: Alert and oriented to person, place, and time. PSYCHIATRIC: Normal mood and affect. Normal behavior. Normal judgment and thought content. CARDIOVASCULAR:Not Examined RESPIRATORY: Not Examined BREASTS: Not Examined ABDOMEN: Soft, non distended; Non tender.  No Organomegaly. Midline scar well-healed PELVIC:  External Genitalia: Atrophic changes  BUS: Normal  Vagina: Moderate to severe atrophy; first-degree cystocele (second degree with Valsalva)  Cervix: Surgically absent  Uterus: Surgically absent  Adnexa: Nonpalpable and nontender  RV: Normal external exam  Bladder: Nontender MUSCULOSKELETAL: Normal range of motion. No tenderness.  No cyanosis, clubbing, or edema.   PROCEDURE: Pessary trial Smallest ring with support pessary was unsuccessful.  Assessment:   1. Urge incontinence of urine  2. Cystocele, First-degree  3. Status post hysterectomy  4. Vaginal atrophy, moderate to severe     Plan:   1. Discontinue  Premarin cream  2. Pessary trial-unsuccessful 3. Estring trial 4. Return in 12 weeks for follow-up; patient may require sling procedure.  Brayton Mars,  MD  Note: This dictation was prepared with Dragon dictation along with smaller phrase technology. Any transcriptional errors that result from this process are unintentional.

## 2015-11-07 ENCOUNTER — Other Ambulatory Visit: Payer: Self-pay | Admitting: Family Medicine

## 2015-11-07 DIAGNOSIS — Z1231 Encounter for screening mammogram for malignant neoplasm of breast: Secondary | ICD-10-CM

## 2015-11-09 DIAGNOSIS — I480 Paroxysmal atrial fibrillation: Secondary | ICD-10-CM | POA: Diagnosis not present

## 2015-11-17 ENCOUNTER — Ambulatory Visit (INDEPENDENT_AMBULATORY_CARE_PROVIDER_SITE_OTHER): Payer: Medicare Other | Admitting: Family Medicine

## 2015-11-17 VITALS — BP 99/68 | HR 100 | Temp 97.9°F | Ht 64.5 in | Wt 149.2 lb

## 2015-11-17 DIAGNOSIS — Z0000 Encounter for general adult medical examination without abnormal findings: Secondary | ICD-10-CM

## 2015-11-17 DIAGNOSIS — Z23 Encounter for immunization: Secondary | ICD-10-CM | POA: Diagnosis not present

## 2015-11-17 NOTE — Progress Notes (Signed)
Subjective:   Jacqueline Webb is a 75 y.o. female who presents for Medicare Annual (Subsequent) preventive examination. [place] with husband in [place]--2 hours from here. Review of Systems:  N/A Cardiac Risk Factors include: advanced age (>61men, >70 women);hypertension;dyslipidemia     Objective:     Vitals: BP 99/68 (BP Location: Right Arm)   Pulse 100   Temp 97.9 F (36.6 C) (Oral)   Ht 5' 4.5" (1.638 m)   Wt 149 lb 4 oz (67.7 kg)   BMI 25.22 kg/m   Body mass index is 25.22 kg/m.   Tobacco History  Smoking Status  . Never Smoker  Smokeless Tobacco  . Never Used     Counseling given: Not Answered   Past Medical History:  Diagnosis Date  . A-fib ([place])   . Benign neoplasm of rectum and anal canal   . 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 Hypercholesteremia   . Hypertension   . Hyperthyroidism   . Lymphoma ([place]) 2007  . Non Hodgkin's lymphoma ([place])   . Non Hodgkin's lymphoma ([place])   . Non Hodgkin's lymphoma ([place]) 2008  . Non Hodgkin's lymphoma ([place]) 01/22/2006   Past Surgical History:  Procedure Laterality Date  . ABDOMINAL HYSTERECTOMY  1958  . APPENDECTOMY    . BREAST BIOPSY Right 1970   right breast biopsy with clip  . BREAST BIOPSY Left 2002   left breast biopsy with clip  . BREAST BIOPSY Right 2014   right breast biopsy with clip  . BREAST CYST ASPIRATION Bilateral 2000   bilateral fine needle aspiration  . BREAST SURGERY Left 1990   biopsy  . BREAST SURGERY Right May 08, 2012   complex fibroadenoma without malignancy.  [name] CARDIAC CATHETERIZATION  2014  . COLONOSCOPY  2009, 2012    hyperplastic rectal polyp 2012 tubular adenoma of proximal ascending colon 2000 9 repeat exam due to 2017.  [name] CORONARY ARTERY BYPASS GRAFT  1999  . FLEXIBLE SIGMOIDOSCOPY  1998   Family History  Problem Relation Age of Onset  . Lung cancer Father     Died age 72  . Heart disease Mother     Died age  5  . Asthma Mother   . CAD Brother     double bypass  . Heart attack Sister   . Tuberculosis Sister     15 years old  . Lung cancer Sister     Died in her 41s  . Angina Sister   . Breast cancer Sister   . Rheum arthritis Sister   . Aneurysm Sister     Brain, died age 18  . Angina Sister   . Heart disease Brother     Stent placement  . Heart disease Brother     stent placement  . Cancer Other     breast cancer niece, pancreatic cancer niece  . Breast cancer Other   . Ovarian cancer Neg Hx   . Diabetes Neg Hx    History  Sexual Activity  . Sexual activity: No    Outpatient Encounter Prescriptions as of 11/17/2015  Medication Sig  . estradiol (ESTRING) 2 MG vaginal ring Place 2 mg vaginally every 3 (three) months. follow package directions  . levothyroxine (SYNTHROID, LEVOTHROID) 88 MCG tablet Take 1 tablet (88 mcg total) by mouth daily before breakfast.  . lisinopril-hydrochlorothiazide (PRINZIDE,ZESTORETIC) 20-12.5 MG tablet Take 1 tablet by mouth daily.  . Melatonin 1 MG CAPS Take  1 capsule by mouth at bedtime.   . metoprolol succinate (TOPROL-XL) 25 MG 24 hr tablet   . mirabegron ER (MYRBETRIQ) 50 MG TB24 tablet Take 1 tablet (50 mg total) by mouth daily.  Alveda Reasons 20 MG TABS tablet   . conjugated estrogens (PREMARIN) vaginal cream Place 1 Applicatorful vaginally daily. Apply 0.5mg  (pea-sized amount)  just inside the vaginal introitus with a finger-tip every night for two weeks and then Monday, Wednesday and Friday nights. (Patient not taking: Reported on 11/17/2015)   No facility-administered encounter medications on file as of 11/17/2015.     Activities of Daily Living In your present state of health, do you have any difficulty performing the following activities: 11/17/2015  Hearing? N  Vision? N  Difficulty concentrating or making decisions? N  Walking or climbing stairs? N  Dressing or bathing? N  Doing errands, shopping? N  Preparing Food and eating ? N    Using the Toilet? N  In the past six months, have you accidently leaked urine? Y  Do you have problems with loss of bowel control? N  Managing your Medications? N  Managing your Finances? N  Housekeeping or managing your Housekeeping? N  Some recent data might be hidden    Patient Care Team: [name], MD as PCP - General (Unknown Physician Specialty) Robert Bellow, MD (General Surgery) Forest Gleason, MD (Oncology) Corey Skains, MD as Consulting Physician (Cardiology) D Orion Modest, OD as Consulting Physician (Optometry)    Assessment:     Exercise Activities and Dietary recommendations Current Exercise Habits: Home exercise routine, Type of exercise: Other - see comments;walking (plays golf), Time (Minutes): > 60, Frequency (Times/Week): 4 (to 5 days), Weekly Exercise (Minutes/Week): 0, Intensity: Mild, Exercise limited by: None identified  Goals    . Increase water intake          Starting 11/17/15, I will increase to 6 glasses of water a day.      Fall Risk Fall Risk  11/17/2015 09/28/2014  Falls in the past year? No No   Depression Screen PHQ 2/9 Scores 11/17/2015 09/28/2014  PHQ - 2 Score 0 0     Cognitive Function     6CIT Screen 11/17/2015  What Year? 0 points  What month? 0 points  What time? 0 points  Count back from 20 0 points  Months in reverse 0 points  Repeat phrase 0 points  Total Score 0    Immunization History  Administered Date(s) Administered  . Influenza, High Dose Seasonal PF 12/10/2014, 11/17/2015  . Pneumococcal Conjugate-13 12/31/2013  . Pneumococcal Polysaccharide-23 11/21/2010  . Td 10/09/2007   Screening Tests Health Maintenance  Topic Date Due  . DEXA SCAN  11/16/2025 (Originally 10/11/2003)  . ZOSTAVAX  11/16/2025 (Originally 10/11/1998)  . TETANUS/TDAP  10/08/2017  . INFLUENZA VACCINE  Completed  . PNA vac Low Risk Adult  Completed      Plan:    I have personally reviewed and addressed the Medicare  Annual Wellness questionnaire and have noted the following in the patient's chart:  A. Medical and social history B. Use of alcohol, tobacco or illicit drugs  C. Current medications and supplements D. Functional ability and status E.  Nutritional status F.  Physical activity G. Advance directives H. List of other physicians I.  Hospitalizations, surgeries, and ER visits in previous 12 months J.  [place] such as hearing and vision if needed, cognitive and depression L. Referrals and appointments -  none  In addition, I have reviewed and discussed with patient certain preventive protocols, quality metrics, and best practice recommendations. A written personalized care plan for preventive services as well as general preventive health recommendations were provided to patient.  See attached scanned questionnaire for additional information.   Signed,  Fabio Neighbors, LPN Nurse Health Advisor  I have reviewed the information as  put it by [facility] LPN and was available for any questions or concerns. Miguel Aschoff MD [facility]

## 2015-11-17 NOTE — Patient Instructions (Signed)
Jacqueline Webb , Thank you for taking time to come for your Medicare Wellness Visit. I appreciate your ongoing commitment to your health goals. Please review the following plan we discussed and let me know if I can assist you in the future.   These are the goals we discussed: Goals    . Increase water intake          Starting 11/17/15, I will increase to 6 glasses of water a day.       This is a list of the screening recommended for you and due dates:  Health Maintenance  Topic Date Due  . DEXA scan (bone density measurement)  11/16/2025*  . Shingles Vaccine  11/16/2025*  . Tetanus Vaccine  10/08/2017  . Flu Shot  Completed  . Pneumonia vaccines  Completed  *Topic was postponed. The date shown is not the original due date.   Preventive Care for Adults  A healthy lifestyle and preventive care can promote health and wellness. Preventive health guidelines for adults include the following key practices.  . A routine yearly physical is a good way to check with your health care provider about your health and preventive screening. It is a chance to share any concerns and updates on your health and to receive a thorough exam.  . Visit your dentist for a routine exam and preventive care every 6 months. Brush your teeth twice a day and floss once a day. Good oral hygiene prevents tooth decay and gum disease.  . The frequency of eye exams is based on your age, health, family medical history, use  of contact lenses, and other factors. Follow your health care provider's ecommendations for frequency of eye exams.  . Eat a healthy diet. Foods like vegetables, fruits, whole grains, low-fat dairy products, and lean protein foods contain the nutrients you need without too many calories. Decrease your intake of foods high in solid fats, added sugars, and salt. Eat the right amount of calories for you. Get information about a proper diet from your health care provider, if necessary.  . Regular physical  exercise is one of the most important things you can do for your health. Most adults should get at least 150 minutes of moderate-intensity exercise (any activity that increases your heart rate and causes you to sweat) each week. In addition, most adults need muscle-strengthening exercises on 2 or more days a week.  Silver Sneakers may be a benefit available to you. To determine eligibility, you may visit the website: www.silversneakers.com or contact program at 747-173-8397 Mon-Fri between 8AM-8PM.   . Maintain a healthy weight. The body mass index (BMI) is a screening tool to identify possible weight problems. It provides an estimate of body fat based on height and weight. Your health care provider can find your BMI and can help you achieve or maintain a healthy weight.   For adults 20 years and older: ? A BMI below 18.5 is considered underweight. ? A BMI of 18.5 to 24.9 is normal. ? A BMI of 25 to 29.9 is considered overweight. ? A BMI of 30 and above is considered obese.   . Maintain normal blood lipids and cholesterol levels by exercising and minimizing your intake of saturated fat. Eat a balanced diet with plenty of fruit and vegetables. Blood tests for lipids and cholesterol should begin at age 50 and be repeated every 5 years. If your lipid or cholesterol levels are high, you are over 50, or you are at high risk for  heart disease, you may need your cholesterol levels checked more frequently. Ongoing high lipid and cholesterol levels should be treated with medicines if diet and exercise are not working.  . If you smoke, find out from your health care provider how to quit. If you do not use tobacco, please do not start.  . If you choose to drink alcohol, please do not consume more than 2 drinks per day. One drink is considered to be 12 ounces (355 mL) of beer, 5 ounces (148 mL) of wine, or 1.5 ounces (44 mL) of liquor.  . If you are 23-1 years old, ask your health care provider if you  should take aspirin to prevent strokes.  . Use sunscreen. Apply sunscreen liberally and repeatedly throughout the day. You should seek shade when your shadow is shorter than you. Protect yourself by wearing long sleeves, pants, a wide-brimmed hat, and sunglasses year round, whenever you are outdoors.  . Once a month, do a whole body skin exam, using a mirror to look at the skin on your back. Tell your health care provider of new moles, moles that have irregular borders, moles that are larger than a pencil eraser, or moles that have changed in shape or color.

## 2015-12-05 ENCOUNTER — Encounter: Payer: Self-pay | Admitting: Family Medicine

## 2015-12-05 ENCOUNTER — Ambulatory Visit (INDEPENDENT_AMBULATORY_CARE_PROVIDER_SITE_OTHER): Payer: Medicare Other | Admitting: Family Medicine

## 2015-12-05 ENCOUNTER — Ambulatory Visit
Admission: RE | Admit: 2015-12-05 | Discharge: 2015-12-05 | Disposition: A | Discharge status: Home / Self Care | Payer: Medicare Other | Source: Ambulatory Visit | Attending: Family Medicine | Admitting: Family Medicine

## 2015-12-05 VITALS — BP 122/78 | HR 93 | Temp 98.7°F | Resp 16 | Wt 149.0 lb

## 2015-12-05 DIAGNOSIS — N3941 Urge incontinence: Secondary | ICD-10-CM | POA: Diagnosis not present

## 2015-12-05 DIAGNOSIS — C8590 Non-Hodgkin lymphoma, unspecified, unspecified site: Secondary | ICD-10-CM

## 2015-12-05 DIAGNOSIS — I10 Essential (primary) hypertension: Secondary | ICD-10-CM

## 2015-12-05 DIAGNOSIS — Z1231 Encounter for screening mammogram for malignant neoplasm of breast: Secondary | ICD-10-CM | POA: Insufficient documentation

## 2015-12-05 DIAGNOSIS — I481 Persistent atrial fibrillation: Secondary | ICD-10-CM | POA: Diagnosis not present

## 2015-12-05 DIAGNOSIS — I4819 Other persistent atrial fibrillation: Secondary | ICD-10-CM

## 2015-12-05 HISTORY — DX: Personal history of antineoplastic chemotherapy: Z92.21

## 2015-12-05 NOTE — Progress Notes (Signed)
Patient: Jacqueline Webb Female    DOB: 13-Sep-1940   75 y.o.   MRN: [id number] Visit Date: 12/05/2015  Today's Provider: [name], MD   Chief Complaint  Patient presents with  . Hypertension  . Hypothyroidism   Subjective:    HPI  Hypertension, follow-up:  BP Readings from Last 3 Encounters:  12/05/15 122/78  11/17/15 99/68  10/19/15 115/77    She was last seen for hypertension 6 months ago.  BP at that visit was 99/68. Management since that visit includes no change. She reports good compliance with treatment. She is not having side effects.  She is not exercising. She is adherent to low salt diet.   Outside blood pressures are checked occasionally. She is experiencing none.  Patient denies chest pain, irregular heart beat, lower extremity edema and palpitations.   Cardiovascular risk factors include hypertension.    Weight trend: stable Wt Readings from Last 3 Encounters:  12/05/15 149 lb (67.6 kg)  11/17/15 149 lb 4 oz (67.7 kg)  10/19/15 150 lb 4.8 oz (68.2 kg)    Current diet: well balanced   Hypothyroidism, follow up:   Patient was last seen for this 6 months ago. No changes were made. Patient is currently taking levothyroxine 49mcg.    No Known Allergies   Current Outpatient Prescriptions:  .  estradiol (ESTRING) 2 MG vaginal ring, Place 2 mg vaginally every 3 (three) months. follow package directions, Disp: 1 each, Rfl: 0 .  levothyroxine (SYNTHROID, LEVOTHROID) 88 MCG tablet, Take 1 tablet (88 mcg total) by mouth daily before breakfast., Disp: 90 tablet, Rfl: 3 .  lisinopril-hydrochlorothiazide (PRINZIDE,ZESTORETIC) 20-12.5 MG tablet, Take 1 tablet by mouth daily., Disp: 90 tablet, Rfl: 3 .  Melatonin 1 MG CAPS, Take 1 capsule by mouth at bedtime. , Disp: , Rfl:  .  metoprolol succinate (TOPROL-XL) 25 MG 24 hr tablet, , Disp: , Rfl:  .  mirabegron ER (MYRBETRIQ) 50 MG TB24 tablet, Take 1 tablet (50 mg total) by mouth daily.,  Disp: 30 tablet, Rfl: 5 .  XARELTO 20 MG TABS tablet, , Disp: , Rfl:  .  conjugated estrogens (PREMARIN) vaginal cream, Place 1 Applicatorful vaginally daily. Apply 0.5mg  (pea-sized amount)  just inside the vaginal introitus with a finger-tip every night for two weeks and then Monday, Wednesday and Friday nights. (Patient not taking: Reported on 12/05/2015), Disp: 30 g, Rfl: 12  Review of Systems  Constitutional: Negative.   Respiratory: Negative.   Cardiovascular: Negative.   Endocrine: Negative.   Musculoskeletal: Negative.   Neurological: Negative.     Social History  Substance Use Topics  . Smoking status: Never Smoker  . Smokeless tobacco: Never Used  . Alcohol use 1.2 - 1.8 oz/week    1 - 2 Glasses of wine, 1 Shots of liquor per week     Comment: daily-    Objective:   BP 122/78 (BP Location: Left Arm, Patient Position: Sitting, Cuff Size: Normal)   Pulse 93   Temp 98.7 F (37.1 C)   Resp 16   Wt 149 lb (67.6 kg)   SpO2 100%   BMI 25.18 kg/m   Physical Exam  Constitutional: She is oriented to person, place, and time. She appears well-developed and well-nourished.  HENT:  Head: Normocephalic and atraumatic.  Right Ear: External ear normal.  Left Ear: External ear normal.  Nose: Nose normal.  Neck: Normal range of motion. Neck supple.  Cardiovascular: Normal rate, regular rhythm  and normal heart sounds.   Pulmonary/Chest: Effort normal and breath sounds normal.  Abdominal: Soft.  Musculoskeletal: She exhibits no edema.  Neurological: She is alert and oriented to person, place, and time.  Skin: Skin is warm and dry.  Psychiatric: She has a normal mood and affect. Her behavior is normal. Judgment and thought content normal.        Assessment & Plan:     1. Essential (primary) hypertension   2. Persistent atrial fibrillation (HCC)   3. Lymphoma, unspecified body region, unspecified lymphoma type ([place])   4. Urge incontinence of urine  5. CAD All risk  factors treated        [name], MD  [facility]

## 2015-12-27 ENCOUNTER — Ambulatory Visit (INDEPENDENT_AMBULATORY_CARE_PROVIDER_SITE_OTHER): Payer: Medicare Other | Admitting: Obstetrics and Gynecology

## 2015-12-27 ENCOUNTER — Encounter: Payer: Self-pay | Admitting: Obstetrics and Gynecology

## 2015-12-27 VITALS — BP 129/83 | HR 105 | Ht 64.5 in | Wt 152.0 lb

## 2015-12-27 DIAGNOSIS — Z90710 Acquired absence of both cervix and uterus: Secondary | ICD-10-CM

## 2015-12-27 DIAGNOSIS — N3289 Other specified disorders of bladder: Secondary | ICD-10-CM | POA: Diagnosis not present

## 2015-12-27 DIAGNOSIS — N952 Postmenopausal atrophic vaginitis: Secondary | ICD-10-CM | POA: Diagnosis not present

## 2015-12-27 DIAGNOSIS — N8111 Cystocele, midline: Secondary | ICD-10-CM | POA: Diagnosis not present

## 2015-12-27 NOTE — Progress Notes (Signed)
Chief complaint: 1. Urinary incontinence 2. Cystocele 3. Vaginal atrophy  Patient is status post total abdominal hysterectomy.   Patient presents for 3 month follow-up; she has been using the Estring for the past 3 months, previously used Premarin vaginal cream Trial of Premarin cream as well as the Estring has not impacted symptomatology. Experiences spontaneous urinary incontinence and urgency without stress urinary incontinence symptoms. Not sexually active.   Past Medical History:  Diagnosis Date  . A-fib ([place])   . Benign neoplasm of rectum and anal canal   . 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 Hypercholesteremia   . Hypertension   . Hyperthyroidism   . Lymphoma ([place]) 2007  . Non Hodgkin's lymphoma ([place])   . Non Hodgkin's lymphoma ([place])   . Non Hodgkin's lymphoma ([place]) 2008  . Non Hodgkin's lymphoma ([place]) 01/22/2006  . Personal history of chemotherapy    Past Surgical History:  Procedure Laterality Date  . ABDOMINAL HYSTERECTOMY  1958  . APPENDECTOMY    . BREAST BIOPSY Right 1970   right breast biopsy with clip- neg  . BREAST BIOPSY Left 2002   left breast biopsy with clip-neg  . BREAST BIOPSY Right 2014   right breast biopsy with clip-neg  . BREAST CYST ASPIRATION Bilateral 2000   bilateral fine needle aspiration  . BREAST SURGERY Left 1990   biopsy  . BREAST SURGERY Right May 08, 2012   complex fibroadenoma without malignancy.  [name] CARDIAC CATHETERIZATION  2014  . COLONOSCOPY  2009, 2012    hyperplastic rectal polyp 2012 tubular adenoma of proximal ascending colon 2000 9 repeat exam due to 2017.  [name] CORONARY ARTERY BYPASS GRAFT  1999  . [place]   Review of systems: Per history of present illness  OBJECTIVE: BP 129/83   Pulse (!) 105   Ht 5' 4.5" (1.638 m)   Wt 152 lb (68.9 kg)   BMI 25.69 kg/m  Physical exam-deferred  ASSESSMENT: 1. Urge incontinence, refractory to Merbetriq  and vaginal estrogen 2. Spontaneous urinary incontinence, uncertain etiology 3. Cystocele, first-degree 4. Status post hysterectomy 5. Vaginal atrophy, moderate to severe  PLAN: 1. Refer back to urology for further evaluation and consideration of urodynamic testing, possibly cystoscopy with intravesical Botox injections.  A total of 15 minutes were spent face-to-face with the patient during this encounter and over half of that time dealt with counseling and coordination of care.  Brayton Mars, MD   Note: This dictation was prepared with Dragon dictation along with smaller phrase technology. Any transcriptional errors that result from this process are unintentional.

## 2015-12-27 NOTE — Patient Instructions (Signed)
1. Patient will be referred to [facility] urology for follow-up on unstable bladder symptoms. Merbetriq has been unsuccessful. Vaginal estrogen therapy has not impacted her symptoms. Consideration for bladder Botox injections may be indicated.

## 2016-01-24 ENCOUNTER — Ambulatory Visit (INDEPENDENT_AMBULATORY_CARE_PROVIDER_SITE_OTHER): Payer: Medicare Other | Admitting: Urology

## 2016-01-24 ENCOUNTER — Other Ambulatory Visit: Payer: Self-pay

## 2016-01-24 ENCOUNTER — Other Ambulatory Visit: Payer: Self-pay | Admitting: Family Medicine

## 2016-01-24 VITALS — BP 144/89 | HR 108 | Ht 65.0 in | Wt 149.5 lb

## 2016-01-24 DIAGNOSIS — I10 Essential (primary) hypertension: Secondary | ICD-10-CM | POA: Diagnosis not present

## 2016-01-24 DIAGNOSIS — I25810 Atherosclerosis of coronary artery bypass graft(s) without angina pectoris: Secondary | ICD-10-CM | POA: Diagnosis not present

## 2016-01-24 DIAGNOSIS — I482 Chronic atrial fibrillation: Secondary | ICD-10-CM | POA: Diagnosis not present

## 2016-01-24 DIAGNOSIS — N3946 Mixed incontinence: Secondary | ICD-10-CM

## 2016-01-24 MED ORDER — LEVOTHYROXINE SODIUM 88 MCG PO TABS: 88.0000 ug | ORAL_TABLET | Freq: Every day | ORAL | 0 refills | Status: DC

## 2016-01-24 NOTE — Progress Notes (Signed)
01/24/2016 10:23 AM   Jacqueline Webb 1940/10/01 [id number]  Referring provider: [name], MD [address] [place], [place] [address]  Chief Complaint  Patient presents with  . Urinary Incontinence     x 9 mths     HPI: The patient is a 76 year old female with urge incontinence especially when she goes from a sitting to standing position. She can leak with coughing and sneezing but it is intermittent and mild. She can void every 30 minutes to 1 hour due to urgency. She gets up 3 times a night to urinate. She has not responded well to the beta 3 agonists or Vesicare or an Estrace ring.  She denies enuresis. Her flow is good and she does feel empty. She denies a history of kidney stones and previous GU surgery and has no neurologic issues. She does not get a lot of bladder infections  Modifying factors: There are no other modifying factors  Associated signs and symptoms: There are no other associated signs and symptoms Aggravating and relieving factors: There are no other aggravating or relieving factors Severity: Moderate Duration: Persistent     PMH: Past Medical History:  Diagnosis Date  . A-fib ([place])   . Benign neoplasm of rectum and anal canal   . 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 Hypercholesteremia   . Hypertension   . Hyperthyroidism   . Lymphoma ([place]) 2007  . Non Hodgkin's lymphoma ([place])   . Non Hodgkin's lymphoma ([place])   . Non Hodgkin's lymphoma ([place]) 2008  . Non Hodgkin's lymphoma ([place]) 01/22/2006  . Personal history of chemotherapy     Surgical History: Past Surgical History:  Procedure Laterality Date  . ABDOMINAL HYSTERECTOMY  1958  . APPENDECTOMY    . BREAST BIOPSY Right 1970   right breast biopsy with clip- neg  . BREAST BIOPSY Left 2002   left breast biopsy with clip-neg  . BREAST BIOPSY Right 2014   right breast biopsy with clip-neg  . BREAST CYST ASPIRATION  Bilateral 2000   bilateral fine needle aspiration  . BREAST SURGERY Left 1990   biopsy  . BREAST SURGERY Right May 08, 2012   complex fibroadenoma without malignancy.  [name] CARDIAC CATHETERIZATION  2014  . COLONOSCOPY  2009, 2012    hyperplastic rectal polyp 2012 tubular adenoma of proximal ascending colon 2000 9 repeat exam due to 2017.  [name] CORONARY ARTERY BYPASS GRAFT  1999  . FLEXIBLE SIGMOIDOSCOPY  1998    Home Medications:  Allergies as of 01/24/2016   No Known Allergies     Medication List       Accurate as of 01/24/16 10:23 AM. Always use your most recent med list.          levothyroxine 88 MCG tablet Commonly known as:  SYNTHROID, LEVOTHROID Take 1 tablet (88 mcg total) by mouth daily before breakfast.   lisinopril-hydrochlorothiazide 20-12.5 MG tablet Commonly known as:  PRINZIDE,ZESTORETIC TAKE 1 TABLET BY MOUTH  DAILY   Melatonin 1 MG Caps Take 1 capsule by mouth at bedtime.   metoprolol succinate 25 MG 24 hr tablet Commonly known as:  TOPROL-XL   mirabegron ER 50 MG Tb24 tablet Commonly known as:  MYRBETRIQ Take 1 tablet (50 mg total) by mouth daily.   XARELTO 20 MG Tabs tablet Generic drug:  rivaroxaban       Allergies: No Known Allergies  Family History: Family History  Problem Relation Age of Onset  . Lung cancer Father     Died age 26  . Heart disease Mother     Died age 32  . Asthma Mother   . CAD Brother     double bypass  . Heart attack Sister   . Tuberculosis Sister     92 years old  . Lung cancer Sister     Died in her 49s  . Angina Sister   . Breast cancer Sister 54  . Rheum arthritis Sister   . Aneurysm Sister     Brain, died age 16  . Angina Sister   . Heart disease Brother     Stent placement  . Heart disease Brother     stent placement  . Cancer Other     breast cancer niece, pancreatic cancer niece  . Breast cancer Other   . Ovarian cancer Neg Hx   . Diabetes Neg Hx     Social History:  reports that she has never  smoked. She has never used smokeless tobacco. She reports that she drinks about 1.2 - 1.8 oz of alcohol per week . She reports that she does not use drugs.  ROS: UROLOGY Frequent Urination?: Yes Hard to postpone urination?: Yes Burning/pain with urination?: No Get up at night to urinate?: Yes Leakage of urine?: Yes Urine stream starts and stops?: No Trouble starting stream?: No Do you have to strain to urinate?: No Blood in urine?: No Urinary tract infection?: No Sexually transmitted disease?: No Injury to kidneys or bladder?: No Painful intercourse?: No Weak stream?: No Currently pregnant?: No Vaginal bleeding?: No Last menstrual period?: No  Gastrointestinal Nausea?: No Vomiting?: No Indigestion/heartburn?: No Diarrhea?: No Constipation?: No  Constitutional Fever: No Night sweats?: No Weight loss?: No Fatigue?: No  Skin Skin rash/lesions?: No Itching?: No  Eyes Blurred vision?: No Double vision?: No  Ears/Nose/Throat Sore throat?: No Sinus problems?: No  Hematologic/Lymphatic Swollen glands?: No Easy bruising?: No  Cardiovascular Leg swelling?: Yes Chest pain?: No  Respiratory Cough?: Yes Shortness of breath?: No  Endocrine Excessive thirst?: No  Musculoskeletal Back pain?: No Joint pain?: No  Neurological Headaches?: No Dizziness?: No  Psychologic Depression?: No Anxiety?: No  Physical Exam: BP (!) 144/89 (BP Location: Left Arm, Patient Position: Sitting)   Pulse (!) 108   Ht 5\' 5"  (1.651 m)   Wt 149 lb 8 oz (67.8 kg)   BMI 24.88 kg/m   Constitutional:  Alert and oriented, No acute distress. HEENT: [place] AT, moist mucus membranes.  Trachea midline, no masses. Cardiovascular: No clubbing, cyanosis, or edema. Respiratory: Normal respiratory effort, no increased work of breathing. GI: Abdomen is soft, nontender, nondistended, no abdominal masses GU: No CVA tenderness. Mild grade 2 hyper mobility of the bladder neck and no stress  incontinence. Little to no cystocele and no rectocele Skin: No rashes, bruises or suspicious lesions. Lymph: No cervical or inguinal adenopathy. Neurologic: Grossly intact, no focal deficits, moving all 4 extremities. Psychiatric: Normal mood and affect.  Laboratory Data: Lab Results  Component Value Date   WBC 3.9 10/03/2015   HGB 13.5 10/03/2015   HCT 39.0 10/03/2015   MCV 90.4 10/03/2015   PLT 139 (L) 10/03/2015    Lab Results  Component Value Date   CREATININE 1.05 (H) 10/03/2015    No results found for: PSA  No results found for: TESTOSTERONE  No results found for: HGBA1C  Urinalysis    Component Value Date/Time   BILIRUBINUR negative 08/18/2015 1103  PROTEINUR negative 08/18/2015 1103   UROBILINOGEN 0.2 08/18/2015 1103   NITRITE negative 08/18/2015 1103   LEUKOCYTESUR Negative 08/18/2015 1103    Pertinent Imaging: none  Assessment & Plan:  Patient has mixed incontinence but primarily urge incontinence refractory to medications. She has a very frequent bladder as well as nocturia  The patient will try Toviaz samples and be reassessed in one month. If she does not reacher treatment goal I will order urodynamics and she agreed  1. Mixed incontinence 2. Urinary frequency 3. Nighttime frequency  There are no diagnoses linked to this encounter.  No Follow-up on file.  Reece Packer, MD  [facility] [address], [place], [place] [address] [phone]

## 2016-01-26 ENCOUNTER — Other Ambulatory Visit: Payer: Self-pay

## 2016-01-26 MED ORDER — LEVOTHYROXINE SODIUM 88 MCG PO TABS: 88.0000 ug | ORAL_TABLET | Freq: Every day | ORAL | 0 refills | Status: DC

## 2016-01-30 ENCOUNTER — Telehealth: Payer: Self-pay | Admitting: Urology

## 2016-01-30 DIAGNOSIS — N3281 Overactive bladder: Secondary | ICD-10-CM

## 2016-01-30 MED ORDER — FESOTERODINE FUMARATE ER 8 MG PO TB24: 8.0000 mg | ORAL_TABLET | Freq: Every day | ORAL | 0 refills | Status: DC

## 2016-01-30 NOTE — Telephone Encounter (Signed)
Medication sent to pt pharmacy 

## 2016-01-30 NOTE — Telephone Encounter (Signed)
Pt called to change appt to 2/14 w/Dr. Matilde Sprang.  She will be out of [place] for 2 weeks.  Pt wants to know if we could call her in enough for 2 weeks until her appt.  Please give pt a call.  They live out of town.

## 2016-02-07 ENCOUNTER — Ambulatory Visit: Payer: Medicare Other | Admitting: General Surgery

## 2016-02-07 ENCOUNTER — Encounter: Payer: Self-pay | Admitting: *Deleted

## 2016-02-08 ENCOUNTER — Other Ambulatory Visit: Payer: Self-pay | Admitting: Nurse Practitioner

## 2016-02-13 ENCOUNTER — Inpatient Hospital Stay: Payer: Self-pay

## 2016-02-13 ENCOUNTER — Encounter: Payer: Self-pay | Admitting: General Surgery

## 2016-02-13 ENCOUNTER — Ambulatory Visit (INDEPENDENT_AMBULATORY_CARE_PROVIDER_SITE_OTHER): Payer: Medicare Other | Admitting: General Surgery

## 2016-02-13 ENCOUNTER — Telehealth: Payer: Self-pay | Admitting: Internal Medicine

## 2016-02-13 VITALS — BP 120/70 | HR 98 | Resp 12 | Ht 65.0 in | Wt 150.0 lb

## 2016-02-13 DIAGNOSIS — Z86010 Personal history of colonic polyps: Secondary | ICD-10-CM | POA: Diagnosis not present

## 2016-02-13 DIAGNOSIS — R2232 Localized swelling, mass and lump, left upper limb: Secondary | ICD-10-CM

## 2016-02-13 NOTE — Telephone Encounter (Signed)
msg sent to cancer [facility] sch. Team to r/s pet to sooner date.

## 2016-02-13 NOTE — Telephone Encounter (Signed)
Got a call from Dr.Byrnett-regarding enlarged lymphadenopathy in the axilla. Get CBC CMP and LDH today with Dr. Bary Castilla;  Please move the PET as soon as possible [was scheduled for march 2018]/ follow up with few days later. Thx

## 2016-02-13 NOTE — Progress Notes (Signed)
Patient ID: NOVIS KEMPE, female   DOB: 1940/01/29, 76 y.o.   MRN: [id number]  Chief Complaint  Patient presents with  . Colonoscopy    HPI Jacqueline Webb is a 76 y.o. female here today for a evaluation of a colonoscopy . Last colonoscopy was 12/29/2010. The patient had a negative exam at that time, and 2009 she had a tubular adenoma removed from the ascending colon.Patient states she has no Gi problems at this time. Moves her bowels every other day.  The patient is now being followed by Dr. Rogue Bussing or her low-grade lymphoma.  Last exam was 6 months ago and was unremarkable by patient report (as well as review of the EMR).  Patient has feeling well.  HPI  Past Medical History:  Diagnosis Date  . A-fib ([place])   . Benign neoplasm of rectum and anal canal   . 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 Hypercholesteremia   . Hypertension   . Hyperthyroidism   . Lymphoma ([place]) 2007  . Non Hodgkin's lymphoma ([place])   . Non Hodgkin's lymphoma ([place])   . Non Hodgkin's lymphoma ([place]) 2008  . Non Hodgkin's lymphoma ([place]) 01/22/2006  . Personal history of chemotherapy     Past Surgical History:  Procedure Laterality Date  . ABDOMINAL HYSTERECTOMY  1958  . APPENDECTOMY    . BREAST BIOPSY Right 1970   right breast biopsy with clip- neg  . BREAST BIOPSY Left 2002   left breast biopsy with clip-neg  . BREAST BIOPSY Right 2014   right breast biopsy with clip-neg  . BREAST CYST ASPIRATION Bilateral 2000   bilateral fine needle aspiration  . BREAST SURGERY Left 1990   biopsy  . BREAST SURGERY Right May 08, 2012   complex fibroadenoma without malignancy.  [name] CARDIAC CATHETERIZATION  2014  . COLONOSCOPY  2009, 2012    hyperplastic rectal polyp 2012 tubular adenoma of proximal ascending colon 2000 9 repeat exam due to 2017.  [name] CORONARY ARTERY BYPASS GRAFT  1999  . FLEXIBLE SIGMOIDOSCOPY  1998    Family History  Problem  Relation Age of Onset  . Lung cancer Father     Died age 15  . Heart disease Mother     Died age 37  . Asthma Mother   . CAD Brother     double bypass  . Heart attack Sister   . Tuberculosis Sister     52 years old  . Lung cancer Sister     Died in her 82s  . Angina Sister   . Breast cancer Sister 88  . Rheum arthritis Sister   . Aneurysm Sister     Brain, died age 80  . Angina Sister   . Heart disease Brother     Stent placement  . Heart disease Brother     stent placement  . Cancer Other     breast cancer niece, pancreatic cancer niece  . Breast cancer Other   . Ovarian cancer Neg Hx   . Diabetes Neg Hx     Social History Social History  Substance Use Topics  . Smoking status: Never Smoker  . Smokeless tobacco: Never Used  . Alcohol use 1.2 - 1.8 oz/week    1 - 2 Glasses of wine, 1 Shots of liquor per week     Comment: daily-     No Known Allergies  Current Outpatient Prescriptions  Medication Sig Dispense Refill  .  fesoterodine (TOVIAZ) 8 MG TB24 tablet Take 1 tablet (8 mg total) by mouth daily. 30 tablet 0  . levothyroxine (SYNTHROID, LEVOTHROID) 88 MCG tablet Take 1 tablet (88 mcg total) by mouth daily before breakfast. 90 tablet 0  . lisinopril-hydrochlorothiazide (PRINZIDE,ZESTORETIC) 20-12.5 MG tablet TAKE 1 TABLET BY MOUTH  DAILY 90 tablet 3  . Melatonin 1 MG CAPS Take 1 capsule by mouth at bedtime.     . metoprolol succinate (TOPROL-XL) 25 MG 24 hr tablet     . mirabegron ER (MYRBETRIQ) 50 MG TB24 tablet Take 1 tablet (50 mg total) by mouth daily. 30 tablet 5  . XARELTO 20 MG TABS tablet      No current facility-administered medications for this visit.     Review of Systems Review of Systems  Constitutional: Negative.   Respiratory: Negative.   Cardiovascular: Negative.     Blood pressure 120/70, pulse 98, resp. rate 12, height 5\' 5"  (1.651 m), weight 150 lb (68 kg).  Physical Exam Physical Exam  Constitutional: She is oriented to person,  place, and time. She appears well-developed and well-nourished.  Eyes: Conjunctivae are normal. No scleral icterus.  Neck: Neck supple.  Cardiovascular: Normal rate and normal heart sounds.  An irregular rhythm present.  Pulmonary/Chest: Effort normal and breath sounds normal.  Abdominal: Soft. Bowel sounds are normal. There is hepatosplenomegaly. There is no tenderness.  Genitourinary: No breast swelling, tenderness, discharge or bleeding. Pelvic exam was performed with patient prone.  Lymphadenopathy:    She has no cervical adenopathy.    She has axillary adenopathy (Enlarged left axillary nodes appreciated. Nontender.).       Right: No inguinal and no supraclavicular adenopathy present.       Left: No inguinal and no supraclavicular adenopathy present.  Left axilla 3 cm lymph gland.   Neurological: She is alert and oriented to person, place, and time.  Skin: Skin is warm and dry.    Data Reviewed Ultrasound exam of the left axilla showed multiple enlarged lymph nodes with significant cortical thickening. These measure up to 2.7 cm in diameter. The axillary vein and artery are normal.  November 16, 2014 PET/CT showed residual uptake in the left axilla, markedly improved from pretreatment imaging. Axillary node measured approximately 1 cm at that time.   2012 colonoscopy showed a hyperplastic polyp in the rectum. 2009 exam showed a tubular adenoma in the ascending colon without high-grade dysplasia.  Assessment    Recurrent left axillary adenopathy.  Candidatefor follow-up colonoscopy.  Ongoing anticoagulation for atrial fibrillation.    Plan    I spoke with Dr. Rogue Bussing who requested the patient have a CBC, LDH and comprehensive metabolic panel. He will look to move her previously scheduled PET scan up from the March 26 2016 date     Based on his reassessment we'll look to determine if a repeat node biopsy is required, which would necessitate discontinuation of her  anticoagulation and looked complete colonoscopy at the same time.   Colonoscopy with possible biopsy/polypectomy prn: Information regarding the procedure, including its potential risks and complications (including but not limited to perforation of the bowel, which may require emergency surgery to repair, and bleeding) was verbally given to the patient. Educational information regarding lower intestinal endoscopy was given to the patient. Written instructions for how to complete the bowel prep using Miralax were provided. The importance of drinking ample fluids to avoid dehydration as a result of the prep emphasized.   Follow up appointment to be announced.  This information has been scribed by Gaspar Cola CMA.    Robert Bellow 02/13/2016, 8:14 PM

## 2016-02-13 NOTE — Patient Instructions (Addendum)
Colonoscopy, Adult A colonoscopy is an exam to look at the entire large intestine. During the exam, a lubricated, bendable tube is inserted into the anus and then passed into the rectum, colon, and other parts of the large intestine. A colonoscopy is often done as a part of normal colorectal screening or in response to certain symptoms, such as anemia, persistent diarrhea, abdominal pain, and blood in the stool. The exam can help screen for and diagnose medical problems, including:  Tumors.  Polyps.  Inflammation.  Areas of bleeding. Tell a health care provider about:  Any allergies you have.  All medicines you are taking, including vitamins, herbs, eye drops, creams, and over-the-counter medicines.  Any problems you or family members have had with anesthetic medicines.  Any blood disorders you have.  Any surgeries you have had.  Any medical conditions you have.  Any problems you have had passing stool. What are the risks? Generally, this is a safe procedure. However, problems may occur, including:  Bleeding.  A tear in the intestine.  A reaction to medicines given during the exam.  Infection (rare). What happens before the procedure? Eating and drinking restrictions  Follow instructions from your health care provider about eating and drinking, which may include:  A few days before the procedure - follow a low-fiber diet. Avoid nuts, seeds, dried fruit, raw fruits, and vegetables.  1-3 days before the procedure - follow a clear liquid diet. Drink only clear liquids, such as clear broth or bouillon, black coffee or tea, clear juice, clear soft drinks or sports drinks, gelatin desert, and popsicles. Avoid any liquids that contain red or purple dye.  On the day of the procedure - do not eat or drink anything during the 2 hours before the procedure, or within the time period that your health care provider recommends. Bowel prep  If you were prescribed an oral bowel prep to  clean out your colon:  Take it as told by your health care provider. Starting the day before your procedure, you will need to drink a large amount of medicated liquid. The liquid will cause you to have multiple loose stools until your stool is almost clear or light green.  If your skin or anus gets irritated from diarrhea, you may use these to relieve the irritation:  Medicated wipes, such as adult wet wipes with aloe and vitamin E.  A skin soothing-product like petroleum jelly.  If you vomit while drinking the bowel prep, take a break for up to 60 minutes and then begin the bowel prep again. If vomiting continues and you cannot take the bowel prep without vomiting, call your health care provider. General instructions  Ask your health care provider about changing or stopping your regular medicines. This is especially important if you are taking diabetes medicines or blood thinners.  Plan to have someone take you home from the hospital or clinic. What happens during the procedure?  An IV tube may be inserted into one of your veins.  You will be given medicine to help you relax (sedative).  To reduce your risk of infection:  Your health care team will wash or sanitize their hands.  Your anal area will be washed with soap.  You will be asked to lie on your side with your knees bent.  Your health care provider will lubricate a long, thin, flexible tube. The tube will have a camera and a light on the end.  The tube will be inserted into your anus.  The tube will be gently eased through your rectum and colon.  Air will be delivered into your colon to keep it open. You may feel some pressure or cramping.  The camera will be used to take images during the procedure.  A small tissue sample may be removed from your body to be examined under a microscope (biopsy). If any potential problems are found, the tissue will be sent to a lab for testing.  If small polyps are found, your health  care provider may remove them and have them checked for cancer cells.  The tube that was inserted into your anus will be slowly removed. The procedure may vary among health care providers and hospitals. What happens after the procedure?  Your blood pressure, heart rate, breathing rate, and blood oxygen level will be monitored until the medicines you were given have worn off.  Do not drive for 24 hours after the exam.  You may have a small amount of blood in your stool.  You may pass gas and have mild abdominal cramping or bloating due to the air that was used to inflate your colon during the exam.  It is up to you to get the results of your procedure. Ask your health care provider, or the department performing the procedure, when your results will be ready. This information is not intended to replace advice given to you by your health care provider. Make sure you discuss any questions you have with your health care provider. Document Released: 01/06/2000 Document Revised: 07/29/2015 Document Reviewed: 03/22/2015 Elsevier Interactive Patient Education  2017 [place].  Follow up appointment to be announced.

## 2016-02-14 LAB — CBC WITH DIFFERENTIAL/PLATELET
BASOS ABS: 0 10*3/uL (ref 0.0–0.2)
BASOS: 1 %
EOS (ABSOLUTE): 0.1 10*3/uL (ref 0.0–0.4)
Eos: 1 %
HEMATOCRIT: 41.5 % (ref 34.0–46.6)
HEMOGLOBIN: 13.7 g/dL (ref 11.1–15.9)
Immature Grans (Abs): 0 10*3/uL (ref 0.0–0.1)
Immature Granulocytes: 0 %
LYMPHS ABS: 1.5 10*3/uL (ref 0.7–3.1)
Lymphs: 26 %
MCH: 28.8 pg (ref 26.6–33.0)
MCHC: 33 g/dL (ref 31.5–35.7)
MCV: 87 fL (ref 79–97)
MONOCYTES: 8 %
Monocytes Absolute: 0.5 10*3/uL (ref 0.1–0.9)
NEUTROS ABS: 3.6 10*3/uL (ref 1.4–7.0)
Neutrophils: 64 %
Platelets: 138 10*3/uL — ABNORMAL LOW (ref 150–379)
RBC: 4.75 x10E6/uL (ref 3.77–5.28)
RDW: 14.6 % (ref 12.3–15.4)
WBC: 5.6 10*3/uL (ref 3.4–10.8)

## 2016-02-14 LAB — COMPREHENSIVE METABOLIC PANEL WITH GFR
ALT: 17 [IU]/L (ref 0–32)
AST: 23 [IU]/L (ref 0–40)
Albumin/Globulin Ratio: 1.8 (ref 1.2–2.2)
Albumin: 4.2 g/dL (ref 3.5–4.8)
Alkaline Phosphatase: 61 [IU]/L (ref 39–117)
BUN/Creatinine Ratio: 22 (ref 12–28)
BUN: 24 mg/dL (ref 8–27)
Bilirubin Total: 0.6 mg/dL (ref 0.0–1.2)
CO2: 27 mmol/L (ref 18–29)
Calcium: 10.3 mg/dL (ref 8.7–10.3)
Chloride: 103 mmol/L (ref 96–106)
Creatinine, Ser: 1.08 mg/dL — ABNORMAL HIGH (ref 0.57–1.00)
GFR calc Af Amer: 57 mL/min/{1.73_m2} — ABNORMAL LOW
GFR calc non Af Amer: 50 mL/min/{1.73_m2} — ABNORMAL LOW
Globulin, Total: 2.4 g/dL (ref 1.5–4.5)
Glucose: 84 mg/dL (ref 65–99)
Potassium: 3.9 mmol/L (ref 3.5–5.2)
Sodium: 144 mmol/L (ref 134–144)
Total Protein: 6.6 g/dL (ref 6.0–8.5)

## 2016-02-14 LAB — LACTATE DEHYDROGENASE: LDH: 179 IU/L (ref 119–226)

## 2016-02-15 ENCOUNTER — Encounter: Payer: Self-pay | Admitting: General Surgery

## 2016-02-22 ENCOUNTER — Inpatient Hospital Stay: Payer: Medicare Other | Attending: Internal Medicine | Admitting: Internal Medicine

## 2016-02-22 ENCOUNTER — Inpatient Hospital Stay: Payer: Medicare Other

## 2016-02-22 ENCOUNTER — Other Ambulatory Visit: Payer: Self-pay | Admitting: *Deleted

## 2016-02-22 ENCOUNTER — Ambulatory Visit
Admission: RE | Admit: 2016-02-22 | Discharge: 2016-02-22 | Disposition: A | Discharge status: Home / Self Care | Payer: Medicare Other | Source: Ambulatory Visit | Attending: Internal Medicine | Admitting: Internal Medicine

## 2016-02-22 ENCOUNTER — Ambulatory Visit: Payer: Medicare Other

## 2016-02-22 VITALS — BP 113/70 | HR 123 | Temp 98.4°F | Wt 147.2 lb

## 2016-02-22 DIAGNOSIS — E0590 Thyrotoxicosis, unspecified without thyrotoxic crisis or storm: Secondary | ICD-10-CM | POA: Diagnosis not present

## 2016-02-22 DIAGNOSIS — C8308 Small cell B-cell lymphoma, lymph nodes of multiple sites: Secondary | ICD-10-CM | POA: Insufficient documentation

## 2016-02-22 DIAGNOSIS — I10 Essential (primary) hypertension: Secondary | ICD-10-CM | POA: Insufficient documentation

## 2016-02-22 DIAGNOSIS — E7800 Pure hypercholesterolemia, unspecified: Secondary | ICD-10-CM | POA: Insufficient documentation

## 2016-02-22 DIAGNOSIS — Z9221 Personal history of antineoplastic chemotherapy: Secondary | ICD-10-CM | POA: Diagnosis not present

## 2016-02-22 DIAGNOSIS — Z801 Family history of malignant neoplasm of trachea, bronchus and lung: Secondary | ICD-10-CM | POA: Diagnosis not present

## 2016-02-22 DIAGNOSIS — R1900 Intra-abdominal and pelvic swelling, mass and lump, unspecified site: Secondary | ICD-10-CM | POA: Insufficient documentation

## 2016-02-22 DIAGNOSIS — I4891 Unspecified atrial fibrillation: Secondary | ICD-10-CM | POA: Diagnosis not present

## 2016-02-22 DIAGNOSIS — C8590 Non-Hodgkin lymphoma, unspecified, unspecified site: Secondary | ICD-10-CM

## 2016-02-22 DIAGNOSIS — Z803 Family history of malignant neoplasm of breast: Secondary | ICD-10-CM | POA: Diagnosis not present

## 2016-02-22 DIAGNOSIS — Z79899 Other long term (current) drug therapy: Secondary | ICD-10-CM | POA: Diagnosis not present

## 2016-02-22 LAB — CBC WITH DIFFERENTIAL/PLATELET
BASOS ABS: 0.1 10*3/uL (ref 0–0.1)
BASOS PCT: 1 %
EOS ABS: 0.1 10*3/uL (ref 0–0.7)
Eosinophils Relative: 2 %
HCT: 43 % (ref 35.0–47.0)
Hemoglobin: 14.6 g/dL (ref 12.0–16.0)
Lymphocytes Relative: 27 %
Lymphs Abs: 1.4 10*3/uL (ref 1.0–3.6)
MCH: 29.5 pg (ref 26.0–34.0)
MCHC: 33.9 g/dL (ref 32.0–36.0)
MCV: 87.1 fL (ref 80.0–100.0)
MONO ABS: 0.5 10*3/uL (ref 0.2–0.9)
Monocytes Relative: 9 %
Neutro Abs: 3.3 10*3/uL (ref 1.4–6.5)
Neutrophils Relative %: 61 %
PLATELETS: 142 10*3/uL — AB (ref 150–440)
RBC: 4.94 MIL/uL (ref 3.80–5.20)
RDW: 14.9 % — AB (ref 11.5–14.5)
WBC: 5.4 10*3/uL (ref 3.6–11.0)

## 2016-02-22 LAB — GLUCOSE, CAPILLARY: GLUCOSE-CAPILLARY: 91 mg/dL (ref 65–99)

## 2016-02-22 LAB — COMPREHENSIVE METABOLIC PANEL
ALT: 21 U/L (ref 14–54)
AST: 26 U/L (ref 15–41)
Albumin: 4.2 g/dL (ref 3.5–5.0)
Alkaline Phosphatase: 52 U/L (ref 38–126)
Anion gap: 7 (ref 5–15)
BUN: 28 mg/dL — AB (ref 6–20)
CHLORIDE: 105 mmol/L (ref 101–111)
CO2: 28 mmol/L (ref 22–32)
CREATININE: 1.09 mg/dL — AB (ref 0.44–1.00)
Calcium: 10.3 mg/dL (ref 8.9–10.3)
GFR calc non Af Amer: 48 mL/min — ABNORMAL LOW (ref >60)
GFR, EST AFRICAN AMERICAN: 55 mL/min — AB (ref >60)
Glucose, Bld: 105 mg/dL — ABNORMAL HIGH (ref 65–99)
POTASSIUM: 3.8 mmol/L (ref 3.5–5.1)
SODIUM: 140 mmol/L (ref 135–145)
Total Bilirubin: 0.9 mg/dL (ref 0.3–1.2)
Total Protein: 7 g/dL (ref 6.5–8.1)

## 2016-02-22 LAB — LACTATE DEHYDROGENASE: LDH: 153 U/L (ref 98–192)

## 2016-02-22 MED ORDER — FLUDEOXYGLUCOSE F - 18 (FDG) INJECTION
12.0000 | Freq: Once | INTRAVENOUS | Status: AC | PRN
  Administered 2016-02-22: 12.35 via INTRAVENOUS

## 2016-02-22 NOTE — Progress Notes (Signed)
Patient here today for follow up.   

## 2016-02-22 NOTE — Assessment & Plan Note (Addendum)
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 JAN 31st PET- bilateral axillary adenopathy; mild mediastinal adenopathy- however no significant SUV uptake.  # Clinically no evidence of progression. CBC CMP and LDH normal.   # left ? Adnexal mass- ? Etiology; mild SUV uptake incidental [stable compared to previous PET in 2016] Previous workup 2015- vaginal ultrasound negative. As per radiology recommended MRI of the pelvis with contrast. Patient is claustrophobic; cannot have an MRI.  Will discuss with gyn-onc re: need for further work up.   # follow up in 6 months/labs.   # I reviewed the blood work- with the patient in detail; also reviewed the imaging independently [as summarized above]; and with the patient in detail.

## 2016-02-22 NOTE — Progress Notes (Signed)
[facility] OFFICE PROGRESS NOTE  Patient Care Team: [name], MD as PCP - General (Unknown Physician Specialty) Robert Bellow, MD (General Surgery) Forest Gleason, MD (Oncology) Corey Skains, MD as Consulting Physician (Cardiology) D Orion Modest, OD as Consulting Physician (Optometry)  Cancer Staging Lymphoma [facility]) Staging form: Lymphoid Neoplasms, AJCC 6th Edition - Clinical: No stage assigned - Unsigned    Oncology History   1. Lymphoma low-grade, status Rituxan therapy. 2. Recurrent disease by clinical examination in December of 2012 3. Ovarian mass on PET scan in December of 2012 (normal CA 125). Ovarian mass has resolved after rituximab therapy. 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  Patient had total 5 cycles of chemotherapy with bendamustine and rituximab and 6 cycle was omitted because of significant side effects with and weakness and fatigue.  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  Patient was recently evaluated by surgery for routine follow-up- noted to have bilateral axillary adenopathy. And hence a PET scan was ordered sooner.   Patient denies any new lumps or bumps. Appetite is good. No weight  loss. No night sweats.   REVIEW OF SYSTEMS:  A complete 10 point review of system is done which is negative except mentioned above/history of present illness.   PAST MEDICAL HISTORY :  Past Medical History:  Diagnosis Date  . A-fib ([place])   . Benign neoplasm of rectum and anal canal   . 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 Hypercholesteremia   . Hypertension   . Hyperthyroidism   . Lymphoma ([place]) 2007  . Non Hodgkin's lymphoma ([place])   . Non Hodgkin's lymphoma ([place])   . Non Hodgkin's lymphoma ([place]) 2008  . Non Hodgkin's lymphoma ([place]) 01/22/2006  . Personal history of chemotherapy     PAST SURGICAL HISTORY :   Past Surgical History:  Procedure Laterality Date  . ABDOMINAL HYSTERECTOMY  1958  . APPENDECTOMY    . BREAST BIOPSY Right 1970   right breast biopsy with clip- neg  . BREAST BIOPSY Left 2002   left breast biopsy with clip-neg  . BREAST BIOPSY Right 2014   right breast biopsy with clip-neg  . BREAST CYST ASPIRATION Bilateral 2000   bilateral fine needle aspiration  . BREAST SURGERY Left 1990   biopsy  . BREAST SURGERY Right May 08, 2012   complex fibroadenoma without malignancy.  [name] CARDIAC CATHETERIZATION  2014  . COLONOSCOPY  2009, 2012    hyperplastic rectal polyp 2012 tubular adenoma of proximal ascending colon 2000 9 repeat exam due to 2017.  [name] CORONARY ARTERY BYPASS GRAFT  1999  . [place]  FAMILY HISTORY :   Family History  Problem Relation Age of Onset  . Lung cancer Father     Died age 93  . Heart disease Mother     Died age 43  . Asthma Mother   . CAD Brother     double bypass  . Heart attack Sister   . Tuberculosis Sister     22 years old  . Lung cancer Sister     Died in her 3s  . Angina Sister   . Breast cancer Sister 32  . Rheum arthritis Sister   . Aneurysm Sister     Brain, died age 50  . Angina Sister   . Heart disease Brother     Stent placement  .  Heart disease Brother     stent placement  . Cancer Other     breast cancer niece, pancreatic cancer niece  . Breast cancer Other   . Ovarian cancer Neg Hx   . Diabetes Neg Hx     SOCIAL HISTORY:   Social History  Substance Use Topics  . Smoking status: Never Smoker  . Smokeless tobacco: Never Used  . Alcohol use 1.2 - 1.8 oz/week    1 - 2 Glasses of wine, 1 Shots of liquor per week     Comment: daily-     ALLERGIES:  has No Known Allergies.  MEDICATIONS:  Current Outpatient Prescriptions  Medication Sig Dispense Refill  . fesoterodine (TOVIAZ) 8 MG TB24 tablet Take 1 tablet (8 mg total) by mouth daily. 30 tablet 0  . levothyroxine (SYNTHROID, LEVOTHROID) 88 MCG tablet Take 1 tablet (88 mcg total) by mouth daily before breakfast. 90 tablet 0  . lisinopril-hydrochlorothiazide (PRINZIDE,ZESTORETIC) 20-12.5 MG tablet TAKE 1 TABLET BY MOUTH  DAILY 90 tablet 3  . Melatonin 1 MG CAPS Take 1 capsule by mouth at bedtime.     . metoprolol succinate (TOPROL-XL) 25 MG 24 hr tablet Take 25 mg by mouth daily.     Alveda Reasons 20 MG TABS tablet Take 20 mg by mouth daily with supper.      No current facility-administered medications for this visit.     PHYSICAL EXAMINATION: ECOG PERFORMANCE STATUS: 0 - Asymptomatic  BP 113/70 (BP Location: Left Arm, Patient Position: Sitting)   Pulse (!) 123   Temp 98.4 F (36.9 C) (Tympanic)   Wt 147 lb 4 oz (66.8 kg)   BMI 24.50 kg/m   Filed Weights   02/22/16 1535  Weight: 147 lb 4 oz (66.8 kg)    GENERAL: Well-nourished well-developed; Alert, no distress and comfortable.  Accompanied by her husband. EYES: no pallor or icterus OROPHARYNX: no thrush or ulceration; good dentition  NECK: supple, no masses felt LYMPH: Bil rubbery 1-2 cm palpable lymphadenopathy in the axillary region; none in cervical/ingiunal regions.  LUNGS: clear to auscultation and  No wheeze or crackles HEART/CVS: regular rate & rhythm and no murmurs; No lower extremity  edema ABDOMEN:abdomen soft, non-tender and normal bowel sounds Musculoskeletal:no cyanosis of digits and no clubbing  PSYCH: alert & oriented x 3 with fluent speech NEURO: no focal motor/sensory deficits SKIN:  no rashes or significant lesions  LABORATORY DATA:  I have reviewed the data as listed    Component Value Date/Time   NA 140 02/22/2016 1500   NA 144 02/13/2016 1412   NA 141 05/11/2014 0853   K 3.8 02/22/2016 1500   K 3.6 05/11/2014 0853   CL 105 02/22/2016 1500   CL 103 05/11/2014  0853   CO2 28 02/22/2016 1500   CO2 29 05/11/2014 0853   GLUCOSE 105 (H) 02/22/2016 1500   GLUCOSE 107 (H) 05/11/2014 0853   BUN 28 (H) 02/22/2016 1500   BUN 24 02/13/2016 1412   BUN 22 (H) 05/11/2014 0853   CREATININE 1.09 (H) 02/22/2016 1500   CREATININE 1.07 (H) 05/11/2014 0853   CALCIUM 10.3 02/22/2016 1500   CALCIUM 10.1 05/11/2014 0853   PROT 7.0 02/22/2016 1500   PROT 6.6 02/13/2016 1412   PROT 7.3 05/11/2014 0853   ALBUMIN 4.2 02/22/2016 1500   ALBUMIN 4.2 02/13/2016 1412   ALBUMIN 4.7 05/11/2014 0853   AST 26 02/22/2016 1500   AST 29 05/11/2014 0853   ALT 21 02/22/2016 1500   ALT 20 05/11/2014 0853   ALKPHOS 52 02/22/2016 1500   ALKPHOS 47 05/11/2014 0853   BILITOT 0.9 02/22/2016 1500   BILITOT 0.6 02/13/2016 1412   BILITOT 1.0 05/11/2014 0853   GFRNONAA 48 (L) 02/22/2016 1500   GFRNONAA 51 (L) 05/11/2014 0853   GFRAA 55 (L) 02/22/2016 1500   GFRAA 59 (L) 05/11/2014 0853    No results found for: SPEP, UPEP  Lab Results  Component Value Date   WBC 5.4 02/22/2016   NEUTROABS 3.3 02/22/2016   HGB 14.6 02/22/2016   HCT 43.0 02/22/2016   MCV 87.1 02/22/2016   PLT 142 (L) 02/22/2016      Chemistry      Component Value Date/Time   NA 140 02/22/2016 1500   NA 144 02/13/2016 1412   NA 141 05/11/2014 0853   K 3.8 02/22/2016 1500   K 3.6 05/11/2014 0853   CL 105 02/22/2016 1500   CL 103 05/11/2014 0853   CO2 28 02/22/2016 1500   CO2 29 05/11/2014 0853   BUN 28  (H) 02/22/2016 1500   BUN 24 02/13/2016 1412   BUN 22 (H) 05/11/2014 0853   CREATININE 1.09 (H) 02/22/2016 1500   CREATININE 1.07 (H) 05/11/2014 0853   GLU 98 05/25/2013      Component Value Date/Time   CALCIUM 10.3 02/22/2016 1500   CALCIUM 10.1 05/11/2014 0853   ALKPHOS 52 02/22/2016 1500   ALKPHOS 47 05/11/2014 0853   AST 26 02/22/2016 1500   AST 29 05/11/2014 0853   ALT 21 02/22/2016 1500   ALT 20 05/11/2014 0853   BILITOT 0.9 02/22/2016 1500   BILITOT 0.6 02/13/2016 1412   BILITOT 1.0 05/11/2014 0853       RADIOGRAPHIC STUDIES: I have personally reviewed the radiological images as listed and agreed with the findings in the report. Nm Pet Image Restag (ps) Skull Base To Thigh  Result Date: 02/22/2016 CLINICAL DATA:  Subsequent Treatment strategy for small B-cell lymphoma. EXAM: NUCLEAR MEDICINE PET SKULL BASE TO THIGH TECHNIQUE: 12.4 mCi F-18 FDG was injected intravenously. Full-ring PET imaging was performed from the skull base to thigh after the radiotracer. CT data was obtained and used for attenuation correction and anatomic localization. FASTING BLOOD GLUCOSE:  Value: 91 mg/dl COMPARISON:  11/16/2014 FINDINGS: NECK Bilateral tonsillar activity is present, maximum SUV 8.0 on the left and 5.5 on the right, no CT correlate, probably physiologic. No hypermetabolic adenopathy. CHEST The previously hypermetabolic left axillary lymph node measuring 0.8 cm in parenchymal thickness have fax occlude the fatty filum has a similar morphologic appearance to the prior exam but currently has a maximum SUV of 1 point 5, previously 7.1. There some notable right axillary lymph nodes including a curved node with parenchymal  thickness of 9 mm and an oval-shaped node with parenchymal thickness of 10 mm on image 71/3, but no hypermetabolic activity. Background mediastinal blood pool activity 2.8. 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 There several mildly  enlarged paratracheal lymph nodes that are not hypermetabolic. A 1.4 cm lower paratracheal lymph node anterior to the carina has a maximum standard uptake value of 2.3, less than background mediastinal blood pool activity. ABDOMEN/PELVIS No abnormal hypermetabolic activity within the liver, pancreas, adrenal glands, or spleen. No hypermetabolic lymph nodes in the abdomen or pelvis. Photopenic cystic lesion in the lateral segment left hepatic lobe. No splenomegaly or abnormal splenic activity. Soft tissue fullness anterior to the left psoas muscle, possibly an adnexal lesion, measuring 4.2 by 3.0 cm, maximum SUV 4.2. This has been present but smaller on prior exams and also of similar metabolic activity. He was worked up in 2015 with a pelvic ultrasound which failed does show the lesion. Background hepatic activity 3.2 SKELETON No focal hypermetabolic activity to suggest skeletal metastasis. IMPRESSION: 1. Although there are prominent right paratracheal nodes and borderline prominent axillary nodes, there is no current hypermetabolic nodal activity to suggest active lymphoma. 2. There continues to be a soft tissue density in the left adnexa which is very faintly metabolic. The degree of metabolic activity has not significantly changed from 2015 in the size is similar or perhaps slightly larger. This was worked up back in 2015 with a pelvic ultrasound which did not reveal an abnormality. This may have simply not been well seen on pelvic sonography. Although the likelihood is that this is a benign lesion, consider working up with dedicated ovarian protocol pelvic MRI with and without contrast. 3. There is some bilateral activity in the tonsillar pillars, without CT correlate, likely physiologic. Electronically Signed   By: Van Clines M.D.   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 JAN 31st PET- bilateral axillary adenopathy; mild mediastinal adenopathy- however no significant SUV uptake.  # Clinically no evidence of progression. CBC CMP and LDH normal.   # left ? Adnexal mass- ? Etiology; mild SUV uptake incidental [stable compared to previous PET in 2016] Previous workup 2015- vaginal ultrasound negative. As per radiology recommended MRI of the pelvis with contrast. Patient is claustrophobic; cannot have an MRI.  Will discuss with gyn-onc re: need for further work up.   # follow up in 6 months/labs.   # I reviewed the blood work- with the patient in detail; also reviewed the imaging independently [as summarized above]; and with the patient in detail.    Orders Placed This Encounter  Procedures  . CBC with Differential/Platelet    Standing Status:   Future    Standing Expiration Date:   11/21/2016  . Comprehensive metabolic panel    Standing Status:   Future    Standing Expiration Date:   11/21/2016  . Lactate dehydrogenase    Standing Status:   Future    Standing Expiration Date:   11/21/2016   All questions were answered. The patient knows to call the clinic with any problems, questions or concerns.      Cammie Sickle, MD 02/22/2016 4:12 PM

## 2016-03-06 ENCOUNTER — Telehealth: Payer: Self-pay | Admitting: Internal Medicine

## 2016-03-06 ENCOUNTER — Other Ambulatory Visit: Payer: Self-pay | Admitting: *Deleted

## 2016-03-06 DIAGNOSIS — C8308 Small cell B-cell lymphoma, lymph nodes of multiple sites: Secondary | ICD-10-CM

## 2016-03-06 DIAGNOSIS — N838 Other noninflammatory disorders of ovary, fallopian tube and broad ligament: Secondary | ICD-10-CM | POA: Insufficient documentation

## 2016-03-06 DIAGNOSIS — F40240 Claustrophobia: Secondary | ICD-10-CM

## 2016-03-06 MED ORDER — DIAZEPAM 2 MG PO TABS: 2.0000 mg | ORAL_TABLET | Freq: Once | ORAL | 0 refills | Status: AC

## 2016-03-06 NOTE — Telephone Encounter (Signed)
msg sent to cancer center sch. Team to set up - mri of abd and pelvis asap. pt is aware that apt will need to be set up. she asked me to send an rx for valium over to her pharmacy for claustrophobia.

## 2016-03-06 NOTE — Telephone Encounter (Signed)
LVM to call us back. Spoke to Dr. Fransisca Connors; recommend MRI of abdomen/pelvis with contrast.   H/J- Please inform pt/ schedule.

## 2016-03-07 ENCOUNTER — Other Ambulatory Visit: Payer: Self-pay | Admitting: *Deleted

## 2016-03-07 ENCOUNTER — Ambulatory Visit (INDEPENDENT_AMBULATORY_CARE_PROVIDER_SITE_OTHER): Payer: Medicare Other | Admitting: Urology

## 2016-03-07 VITALS — BP 121/81 | HR 111 | Ht 65.0 in | Wt 148.6 lb

## 2016-03-07 DIAGNOSIS — N3941 Urge incontinence: Secondary | ICD-10-CM

## 2016-03-07 MED ORDER — FESOTERODINE FUMARATE ER 8 MG PO TB24: 8.0000 mg | ORAL_TABLET | Freq: Every day | ORAL | 3 refills | Status: DC

## 2016-03-07 NOTE — Progress Notes (Signed)
03/07/2016 9:53 AM   Jacqueline Webb 14-Aug-1940 [id number]  Referring provider: [name], MD [address] [place], [place] [address]  Chief Complaint  Patient presents with  . Follow-up    urinary incontinence female     HPI: Patient has mixed incontinence but primarily urge incontinence refractory to medications. She has a very frequent bladder as well as nocturia  The patient will try Toviaz samples and be reassessed in one month. If she does not reacher treatment goal I will order urodynamics and she agreed  Today Urgency incontinence greatly improved and nearly continent. Frequency much less. Patient very pleased    PMH: Past Medical History:  Diagnosis Date  . A-fib ([place])   . Benign neoplasm of rectum and anal canal   . 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 Hypercholesteremia   . Hypertension   . Hyperthyroidism   . Lymphoma ([place]) 2007  . Non Hodgkin's lymphoma ([place])   . Non Hodgkin's lymphoma ([place])   . Non Hodgkin's lymphoma ([place]) 2008  . Non Hodgkin's lymphoma ([place]) 01/22/2006  . Personal history of chemotherapy     Surgical History: Past Surgical History:  Procedure Laterality Date  . ABDOMINAL HYSTERECTOMY  1958  . APPENDECTOMY    . BREAST BIOPSY Right 1970   right breast biopsy with clip- neg  . BREAST BIOPSY Left 2002   left breast biopsy with clip-neg  . BREAST BIOPSY Right 2014   right breast biopsy with clip-neg  . BREAST CYST ASPIRATION Bilateral 2000   bilateral fine needle aspiration  . BREAST SURGERY Left 1990   biopsy  . BREAST SURGERY Right May 08, 2012   complex fibroadenoma without malignancy.  [name] CARDIAC CATHETERIZATION  2014  . COLONOSCOPY  2009, 2012    hyperplastic rectal polyp 2012 tubular adenoma of proximal ascending colon 2000 9 repeat exam due to 2017.  [name] CORONARY ARTERY BYPASS GRAFT  1999  . FLEXIBLE SIGMOIDOSCOPY  1998    Home  Medications:  Allergies as of 03/07/2016   No Known Allergies     Medication List       Accurate as of 03/07/16  9:53 AM. Always use your most recent med list.          fesoterodine 8 MG Tb24 tablet Commonly known as:  TOVIAZ Take 1 tablet (8 mg total) by mouth daily.   levothyroxine 88 MCG tablet Commonly known as:  SYNTHROID, LEVOTHROID Take 1 tablet (88 mcg total) by mouth daily before breakfast.   lisinopril-hydrochlorothiazide 20-12.5 MG tablet Commonly known as:  PRINZIDE,ZESTORETIC TAKE 1 TABLET BY MOUTH  DAILY   Melatonin 1 MG Caps Take 1 capsule by mouth at bedtime.   metoprolol succinate 25 MG 24 hr tablet Commonly known as:  TOPROL-XL Take 25 mg by mouth daily.   XARELTO 20 MG Tabs tablet Generic drug:  rivaroxaban Take 20 mg by mouth daily with supper.       Allergies: No Known Allergies  Family History: Family History  Problem Relation Age of Onset  . Lung cancer Father     Died age 53  . Heart disease Mother     Died age 80  . Asthma Mother   . CAD Brother     double bypass  . Heart attack Sister   . Tuberculosis Sister     70 years old  . Lung cancer Sister     Died in her  21s  . Angina Sister   . Breast cancer Sister 67  . Rheum arthritis Sister   . Aneurysm Sister     Brain, died age 46  . Angina Sister   . Heart disease Brother     Stent placement  . Heart disease Brother     stent placement  . Cancer Other     breast cancer niece, pancreatic cancer niece  . Breast cancer Other   . Ovarian cancer Neg Hx   . Diabetes Neg Hx     Social History:  reports that she has never smoked. She has never used smokeless tobacco. She reports that she drinks about 1.2 - 1.8 oz of alcohol per week . She reports that she does not use drugs.  ROS: UROLOGY Frequent Urination?: Yes Hard to postpone urination?: Yes Burning/pain with urination?: No Get up at night to urinate?: Yes Leakage of urine?: Yes Urine stream starts and stops?:  No Trouble starting stream?: No Do you have to strain to urinate?: No Blood in urine?: No Urinary tract infection?: No Sexually transmitted disease?: No Injury to kidneys or bladder?: No Painful intercourse?: No Weak stream?: No Currently pregnant?: No Vaginal bleeding?: No Last menstrual period?: n  Gastrointestinal Nausea?: No Vomiting?: No Indigestion/heartburn?: No Diarrhea?: No Constipation?: No  Constitutional Fever: No Night sweats?: No Weight loss?: No Fatigue?: No  Skin Skin rash/lesions?: No Itching?: No  Eyes Blurred vision?: No Double vision?: No  Ears/Nose/Throat Sore throat?: No Sinus problems?: Yes  Hematologic/Lymphatic Swollen glands?: No Easy bruising?: No  Cardiovascular Leg swelling?: No Chest pain?: No  Respiratory Cough?: No Shortness of breath?: No  Endocrine Excessive thirst?: No  Musculoskeletal Back pain?: No Joint pain?: No  Neurological Headaches?: No Dizziness?: No  Psychologic Depression?: No Anxiety?: No  Physical Exam: BP 121/81   Pulse (!) 111   Ht 5\' 5"  (1.651 m)   Wt 67.4 kg (148 lb 9.6 oz)   BMI 24.73 kg/m     Laboratory Data: Lab Results  Component Value Date   WBC 5.4 02/22/2016   HGB 14.6 02/22/2016   HCT 43.0 02/22/2016   MCV 87.1 02/22/2016   PLT 142 (L) 02/22/2016    Lab Results  Component Value Date   CREATININE 1.09 (H) 02/22/2016    No results found for: PSA  No results found for: TESTOSTERONE  No results found for: HGBA1C  Urinalysis    Component Value Date/Time   BILIRUBINUR negative 08/18/2015 1103   PROTEINUR negative 08/18/2015 1103   UROBILINOGEN 0.2 08/18/2015 1103   NITRITE negative 08/18/2015 1103   LEUKOCYTESUR Negative 08/18/2015 1103    Pertinent Imaging: none  Assessment & Plan:  Patient's overactive bladder is much better on Toviaz. I will reassess her in 4 months and then once annually. 90 tablets and 3 refills sent to mail order. Samples given  1.  Urge incontinence of urine 2. Frequency   No Follow-up on file.  Reece Packer, MD  [facility] [address] [place], [place] [address] [phone]

## 2016-03-13 ENCOUNTER — Telehealth: Payer: Self-pay | Admitting: Urology

## 2016-03-13 NOTE — Telephone Encounter (Signed)
Pt would like to try a different medication, because the [name] is  to expensive.

## 2016-03-13 NOTE — Telephone Encounter (Signed)
[facility] called pt to let her know that rx for Toviaz 8 mg non formulary would be $1,137 for a $90 day supply.  Pt wants to know if there are other options.  The pt says it works wonderful for her.

## 2016-03-14 ENCOUNTER — Telehealth: Payer: Self-pay

## 2016-03-14 DIAGNOSIS — N3941 Urge incontinence: Secondary | ICD-10-CM

## 2016-03-14 MED ORDER — OXYBUTYNIN CHLORIDE ER 10 MG PO TB24: 10.0000 mg | ORAL_TABLET | Freq: Every day | ORAL | 0 refills | Status: DC

## 2016-03-14 NOTE — Telephone Encounter (Signed)
Dr. Matilde Sprang call to f/u on the message concerning the cost of [place] medication [name]). He said it was okay to call in Oxybutnin 10MG  30 x 11refills. I called the pt and she wanted me just to send a 30day prescription no refills. She wanted to see how well the medication works.

## 2016-03-16 ENCOUNTER — Ambulatory Visit
Admission: RE | Admit: 2016-03-16 | Discharge: 2016-03-16 | Disposition: A | Discharge status: Home / Self Care | Payer: Medicare Other | Source: Ambulatory Visit | Attending: Internal Medicine | Admitting: Internal Medicine

## 2016-03-16 ENCOUNTER — Encounter
Admission: RE | Admit: 2016-03-16 | Discharge: 2016-03-16 | Disposition: A | Discharge status: Home / Self Care | Payer: Medicare Other | Source: Ambulatory Visit | Attending: Internal Medicine | Admitting: Internal Medicine

## 2016-03-16 DIAGNOSIS — R188 Other ascites: Secondary | ICD-10-CM | POA: Diagnosis not present

## 2016-03-16 DIAGNOSIS — N838 Other noninflammatory disorders of ovary, fallopian tube and broad ligament: Secondary | ICD-10-CM

## 2016-03-16 DIAGNOSIS — N839 Noninflammatory disorder of ovary, fallopian tube and broad ligament, unspecified: Secondary | ICD-10-CM | POA: Diagnosis not present

## 2016-03-16 DIAGNOSIS — C8300 Small cell B-cell lymphoma, unspecified site: Secondary | ICD-10-CM | POA: Diagnosis not present

## 2016-03-16 MED ORDER — GADOBENATE DIMEGLUMINE 529 MG/ML IV SOLN
15.0000 mL | Freq: Once | INTRAVENOUS | Status: AC | PRN
  Administered 2016-03-16: 15 mL via INTRAVENOUS

## 2016-03-26 ENCOUNTER — Telehealth: Payer: Self-pay | Admitting: Internal Medicine

## 2016-03-26 ENCOUNTER — Ambulatory Visit: Payer: Medicare Other

## 2016-03-26 ENCOUNTER — Other Ambulatory Visit: Payer: Self-pay | Admitting: *Deleted

## 2016-03-26 DIAGNOSIS — C8308 Small cell B-cell lymphoma, lymph nodes of multiple sites: Secondary | ICD-10-CM

## 2016-03-26 DIAGNOSIS — N838 Other noninflammatory disorders of ovary, fallopian tube and broad ligament: Secondary | ICD-10-CM

## 2016-03-26 NOTE — Telephone Encounter (Signed)
Spoke to pt re: results of MRI. As per Dr.Berchuck- likely lymphoma involving ovary; recommend monitornig for now.recommend CT a/p with contrast; few days prior to next visit; also ordee CA-125 at next visit. Pt agrees with the plan.  H/J-please schedule the CT.

## 2016-03-26 NOTE — Telephone Encounter (Signed)
Sent scheduling message to schedule CT, added CA 125 lab

## 2016-04-02 ENCOUNTER — Ambulatory Visit: Payer: Medicare Other | Admitting: Internal Medicine

## 2016-04-02 ENCOUNTER — Other Ambulatory Visit: Payer: Medicare Other

## 2016-04-03 ENCOUNTER — Other Ambulatory Visit: Payer: Self-pay | Admitting: Family Medicine

## 2016-04-04 ENCOUNTER — Telehealth: Payer: Self-pay | Admitting: Urology

## 2016-04-04 NOTE — Telephone Encounter (Signed)
Patient called the office this morning requesting a prescription to be sent to her mail order pharmacy [facility]) for the oxybutinin.

## 2016-04-10 ENCOUNTER — Other Ambulatory Visit: Payer: Self-pay

## 2016-04-10 DIAGNOSIS — N3941 Urge incontinence: Secondary | ICD-10-CM

## 2016-04-10 MED ORDER — OXYBUTYNIN CHLORIDE ER 10 MG PO TB24: 10.0000 mg | ORAL_TABLET | Freq: Every day | ORAL | 3 refills | Status: DC

## 2016-06-04 ENCOUNTER — Ambulatory Visit (INDEPENDENT_AMBULATORY_CARE_PROVIDER_SITE_OTHER): Payer: Medicare Other | Admitting: Family Medicine

## 2016-06-04 VITALS — BP 118/62 | HR 84 | Temp 98.8°F | Resp 16 | Wt 148.0 lb

## 2016-06-04 DIAGNOSIS — C8590 Non-Hodgkin lymphoma, unspecified, unspecified site: Secondary | ICD-10-CM

## 2016-06-04 DIAGNOSIS — H401130 Primary open-angle glaucoma, bilateral, stage unspecified: Secondary | ICD-10-CM | POA: Diagnosis not present

## 2016-06-04 DIAGNOSIS — Z961 Presence of intraocular lens: Secondary | ICD-10-CM | POA: Diagnosis not present

## 2016-06-04 DIAGNOSIS — I481 Persistent atrial fibrillation: Secondary | ICD-10-CM | POA: Diagnosis not present

## 2016-06-04 DIAGNOSIS — I10 Essential (primary) hypertension: Secondary | ICD-10-CM

## 2016-06-04 DIAGNOSIS — E7800 Pure hypercholesterolemia, unspecified: Secondary | ICD-10-CM | POA: Diagnosis not present

## 2016-06-04 DIAGNOSIS — E038 Other specified hypothyroidism: Secondary | ICD-10-CM | POA: Diagnosis not present

## 2016-06-04 DIAGNOSIS — N3281 Overactive bladder: Secondary | ICD-10-CM

## 2016-06-04 DIAGNOSIS — I4819 Other persistent atrial fibrillation: Secondary | ICD-10-CM

## 2016-06-04 NOTE — Progress Notes (Signed)
Jacqueline Webb  MRN: [id number] DOB: 1940-11-09  Subjective:  HPI  Patient is here for follow up. Last office visit was on 12/05/15. B/P: patient checks her b/p but is not sure of the readings she has been getting. No cardiac symptoms present. Some shortness of breath at times. She did have check up with cardiologist-Dr Nehemiah Massed in January 2018 for check up.  BP Readings from Last 3 Encounters:  06/04/16 118/62  03/07/16 121/81  02/22/16 113/70   Last lab work: CBC and MetC on 02/22/16, TSH and Lipid on 12/22/14. Patient also seen urologist for OAB and is on Oxybutynin right now, [name] was very expensive. Patient is seen oncologist for follow up: had Pet Scan and MRI done in January and February 2018. Everything is stable at this time. Wt Readings from Last 3 Encounters:  06/04/16 148 lb (67.1 kg)  03/07/16 148 lb 9.6 oz (67.4 kg)  02/22/16 147 lb 4 oz (66.8 kg)   Patient Active Problem List   Diagnosis Date Noted  . Mass of left ovary 03/06/2016  . Cystocele, midline 12/27/2015  . Non Hodgkin's lymphoma ([place])   . Cystocele 10/19/2015  . Status post hysterectomy 10/19/2015  . Vaginal atrophy 10/19/2015  . 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 Moderate mitral insufficiency 08/10/2015  . Persistent atrial fibrillation ([place]) 08/10/2015  . Urge incontinence of urine 06/07/2015  . Personal history of surgery to heart and great vessels, presenting hazards to health 05/14/2014  . Raynaud's syndrome 05/14/2014  . Adult hypothyroidism 05/14/2014  . Hypercholesteremia 05/14/2014  . Coronary artery abnormality 05/14/2014  . Essential (primary) hypertension 05/14/2014  . Personal history of malignant neoplasm of breast 05/14/2014  . Lymphoma ([place]) 04/14/2014  . Non Hodgkin's lymphoma ([place]) 01/22/2006    Past Medical History:  Diagnosis Date  . A-fib ([place])   . Benign neoplasm of rectum and anal canal   . Chronic leukemia of  unspecified cell type, without mention of having achieved remission   . Hypercholesteremia   . Hypertension   . Hyperthyroidism   . Lymphoma ([place]) 2007  . Non Hodgkin's lymphoma ([facility])   . Non Hodgkin's lymphoma ([place])   . Non Hodgkin's lymphoma ([place]) 2008  . Non Hodgkin's lymphoma ([place]) 01/22/2006  . Personal history of chemotherapy     Social History   Social History  . Marital status: Married    Spouse name: N/A  . Number of children: N/A  . Years of education: N/A   Occupational History  . Not on file.   Social History Main Topics  . Smoking status: Never Smoker  . Smokeless tobacco: Never Used  . Alcohol use 1.2 - 1.8 oz/week    1 - 2 Glasses of wine, 1 Shots of liquor per week     Comment: daily-   . Drug use: No  . Sexual activity: No   Other Topics Concern  . Not on file   Social History Narrative  . No narrative on file    Outpatient Encounter Prescriptions as of 06/04/2016  Medication Sig Note  . levothyroxine (SYNTHROID, LEVOTHROID) 88 MCG tablet TAKE 1 TABLET BY MOUTH  DAILY BEFORE BREAKFAST   . lisinopril-hydrochlorothiazide (PRINZIDE,ZESTORETIC) 20-12.5 MG tablet TAKE 1 TABLET BY MOUTH  DAILY   . Melatonin 1 MG CAPS Take 1 capsule by mouth at bedtime.    . metoprolol succinate (TOPROL-XL) 25 MG 24 hr tablet Take 25 mg by mouth daily.  08/18/2015: Received  from: External Pharmacy  . oxybutynin (DITROPAN-XL) 10 MG 24 hr tablet Take 1 tablet (10 mg total) by mouth daily.   Alveda Reasons 20 MG TABS tablet Take 20 mg by mouth daily with supper.  08/18/2015: Received from: External Pharmacy  . [DISCONTINUED] fesoterodine (TOVIAZ) 8 MG TB24 tablet Take 1 tablet (8 mg total) by mouth daily.   . [DISCONTINUED] fesoterodine (TOVIAZ) 8 MG TB24 tablet Take 1 tablet (8 mg total) by mouth daily.    No facility-administered encounter medications on file as of 06/04/2016.     No Known Allergies  Review of Systems  Constitutional: Negative.   Eyes: Negative.     Respiratory: Positive for shortness of breath (at times).   Cardiovascular: Negative.   Gastrointestinal: Negative.   Genitourinary: Positive for frequency and urgency.       Urinary incontinence  Musculoskeletal: Negative.   Skin: Negative.   Endo/Heme/Allergies: Negative.   Psychiatric/Behavioral: Negative.     Objective:  BP 118/62   Pulse 84   Temp 98.8 F (37.1 C)   Resp 16   Wt 148 lb (67.1 kg)   BMI 24.63 kg/m   Physical Exam  Constitutional: She is oriented to person, place, and time and well-developed, well-nourished, and in no distress.  HENT:  Head: Normocephalic and atraumatic.  Right Ear: External ear normal.  Left Ear: External ear normal.  Nose: Nose normal.  Eyes: Conjunctivae are normal. Pupils are equal, round, and reactive to light.  Neck: Normal range of motion. Neck supple.  Cardiovascular: Normal rate, regular rhythm, normal heart sounds and intact distal pulses.  Exam reveals no gallop.   No murmur heard. Pulmonary/Chest: Effort normal and breath sounds normal. No respiratory distress. She has no wheezes.  Abdominal: Soft.  Neurological: She is alert and oriented to person, place, and time. Gait normal. GCS score is 15.  Skin: Skin is warm and dry.  Psychiatric: Mood, memory, affect and judgment normal.   Assessment and Plan :  1. Essential (primary) hypertension Stable. Continue current medication.  2. Persistent atrial fibrillation (HCC) Stable. Following Dr Nehemiah Massed. Stable on Xarelto.  3. Lymphoma, unspecified body region, unspecified lymphoma type [facility]) Following up with oncologist.  4. Other specified hypothyroidism Check levels today.  5. Hypercholesteremia Patient is statin intolerant (uscle ache/joint ache). Check levels today. Consider Zetia in the future.  6. OAB (overactive bladder) Following up with urologist and is on treatment plan for this.  HPI, Exam and A&P transcribed by Theressa Millard, RMA under direction  and in the presence of Miguel Aschoff, MD. I have done the exam and reviewed the chart and it is accurate to the best of my knowledge. Development worker, community has been used and  any errors in dictation or transcription are unintentional. Miguel Aschoff M.D. [facility]

## 2016-06-07 DIAGNOSIS — E038 Other specified hypothyroidism: Secondary | ICD-10-CM | POA: Diagnosis not present

## 2016-06-07 DIAGNOSIS — E7800 Pure hypercholesterolemia, unspecified: Secondary | ICD-10-CM | POA: Diagnosis not present

## 2016-06-08 LAB — LIPID PANEL WITH LDL/HDL RATIO
Cholesterol, Total: 192 mg/dL (ref 100–199)
HDL: 71 mg/dL (ref >39)
LDL Calculated: 109 mg/dL — ABNORMAL HIGH (ref 0–99)
LDl/HDL Ratio: 1.5 ratio (ref 0.0–3.2)
Triglycerides: 61 mg/dL (ref 0–149)
VLDL Cholesterol Cal: 12 mg/dL (ref 5–40)

## 2016-06-08 LAB — TSH: TSH: 1.02 u[IU]/mL (ref 0.450–4.500)

## 2016-06-11 ENCOUNTER — Telehealth: Payer: Self-pay

## 2016-06-11 NOTE — Telephone Encounter (Signed)
-----   Message from [name], MD sent at 06/09/2016 12:23 PM EDT ----- Stable.

## 2016-06-11 NOTE — Telephone Encounter (Signed)
Advised pt of lab results. Pt verbally acknowledges understanding. Emily Drozdowski, CMA   

## 2016-07-09 ENCOUNTER — Ambulatory Visit (INDEPENDENT_AMBULATORY_CARE_PROVIDER_SITE_OTHER): Payer: Medicare Other | Admitting: Urology

## 2016-07-09 ENCOUNTER — Encounter: Payer: Self-pay | Admitting: Urology

## 2016-07-09 VITALS — BP 119/82 | HR 78 | Ht 65.0 in | Wt 146.0 lb

## 2016-07-09 DIAGNOSIS — R350 Frequency of micturition: Secondary | ICD-10-CM

## 2016-07-09 DIAGNOSIS — N3946 Mixed incontinence: Secondary | ICD-10-CM | POA: Diagnosis not present

## 2016-07-09 LAB — URINALYSIS, COMPLETE
BILIRUBIN UA: NEGATIVE
Glucose, UA: NEGATIVE
Ketones, UA: NEGATIVE
NITRITE UA: NEGATIVE
PH UA: 5 (ref 5.0–7.5)
PROTEIN UA: NEGATIVE
RBC UA: NEGATIVE
Specific Gravity, UA: 1.02 (ref 1.005–1.030)
UUROB: 0.2 mg/dL (ref 0.2–1.0)

## 2016-07-09 LAB — MICROSCOPIC EXAMINATION: RBC MICROSCOPIC, UA: NONE SEEN /HPF (ref 0–?)

## 2016-07-09 MED ORDER — OXYBUTYNIN CHLORIDE 5 MG PO TABS: 5.0000 mg | ORAL_TABLET | Freq: Two times a day (BID) | ORAL | 11 refills | Status: DC

## 2016-07-09 NOTE — Progress Notes (Signed)
07/09/2016 10:38 AM   Jacqueline Webb 1940/06/21 [id number]  Referring provider: [name], MD [address] [place], [place] [address]  Chief Complaint  Patient presents with  . Urinary Incontinence    HPI: Patient has mixed incontinence but primarily urge incontinence refractory to medications. She has a very frequent bladder as well as nocturia  The patient will try Toviaz samples and be reassessed in one month. If she does not reacher treatment goal I will order urodynamics and she agreed  Urgency incontinence greatly improved and nearly continent. Frequency much less. Patient very pleased   Today The patient was switched to oxybutynin because the [name] was expensive.  The [name] was not covered at all. The want to the oxybutynin helps by about 50%. It to approximate $96 or 3 months. She may have developed a urinary tract infection and had a lot of frequency for 2 days last week and self treat her self with a prescription a Macrodantin were divided by her primary physician. She gets 2 or sometimes 3 infections a year  Overall she has good days and bad days but the oxybutynin is helping.  PMH: Past Medical History:  Diagnosis Date  . A-fib ([place])   . Benign neoplasm of rectum and anal canal   . 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 Hypercholesteremia   . Hypertension   . Hyperthyroidism   . Lymphoma ([place]) 2007  . Non Hodgkin's lymphoma ([place])   . Non Hodgkin's lymphoma ([place])   . Non Hodgkin's lymphoma ([place]) 2008  . Non Hodgkin's lymphoma ([place]) 01/22/2006  . Personal history of chemotherapy     Surgical History: Past Surgical History:  Procedure Laterality Date  . ABDOMINAL HYSTERECTOMY  1958  . APPENDECTOMY    . BREAST BIOPSY Right 1970   right breast biopsy with clip- neg  . BREAST BIOPSY Left 2002   left breast biopsy with clip-neg  . BREAST BIOPSY Right 2014   right breast  biopsy with clip-neg  . BREAST CYST ASPIRATION Bilateral 2000   bilateral fine needle aspiration  . BREAST SURGERY Left 1990   biopsy  . BREAST SURGERY Right May 08, 2012   complex fibroadenoma without malignancy.  [name] CARDIAC CATHETERIZATION  2014  . COLONOSCOPY  2009, 2012    hyperplastic rectal polyp 2012 tubular adenoma of proximal ascending colon 2000 9 repeat exam due to 2017.  [name] CORONARY ARTERY BYPASS GRAFT  1999  . FLEXIBLE SIGMOIDOSCOPY  1998    Home Medications:  Allergies as of 07/09/2016   No Known Allergies     Medication List       Accurate as of 07/09/16 10:38 AM. Always use your most recent med list.          levothyroxine 88 MCG tablet Commonly known as:  SYNTHROID, LEVOTHROID TAKE 1 TABLET BY MOUTH  DAILY BEFORE BREAKFAST   lisinopril-hydrochlorothiazide 20-12.5 MG tablet Commonly known as:  PRINZIDE,ZESTORETIC TAKE 1 TABLET BY MOUTH  DAILY   Melatonin 1 MG Caps Take 1 capsule by mouth at bedtime.   metoprolol succinate 25 MG 24 hr tablet Commonly known as:  TOPROL-XL Take 25 mg by mouth daily.   nitrofurantoin 100 MG capsule Commonly known as:  MACRODANTIN Take 100 mg by mouth 2 (two) times daily.   oxybutynin 5 MG tablet Commonly known as:  DITROPAN Take 1 tablet (5 mg total) by mouth 2 (two) times daily.  XARELTO 20 MG Tabs tablet Generic drug:  rivaroxaban Take 20 mg by mouth daily with supper.       Allergies: No Known Allergies  Family History: Family History  Problem Relation Age of Onset  . Lung cancer Father        Died age 63  . Heart disease Mother        Died age 86  . Asthma Mother   . CAD Brother        double bypass  . Heart attack Sister   . Tuberculosis Sister        75 years old  . Lung cancer Sister        Died in her 49s  . Angina Sister   . Breast cancer Sister 59  . Rheum arthritis Sister   . Aneurysm Sister        Brain, died age 15  . Angina Sister   . Heart disease Brother        Stent placement   . Heart disease Brother        stent placement  . Cancer Other        breast cancer niece, pancreatic cancer niece  . Breast cancer Other   . Ovarian cancer Neg Hx   . Diabetes Neg Hx     Social History:  reports that she has never smoked. She has never used smokeless tobacco. She reports that she drinks about 1.2 - 1.8 oz of alcohol per week . She reports that she does not use drugs.  ROS: UROLOGY Frequent Urination?: Yes Hard to postpone urination?: Yes Burning/pain with urination?: No Get up at night to urinate?: Yes Leakage of urine?: Yes Urine stream starts and stops?: No Trouble starting stream?: No Do you have to strain to urinate?: No Blood in urine?: No Urinary tract infection?: No Sexually transmitted disease?: No Injury to kidneys or bladder?: No Painful intercourse?: No Weak stream?: No Currently pregnant?: No Vaginal bleeding?: No Last menstrual period?: n  Gastrointestinal Nausea?: No Vomiting?: No Indigestion/heartburn?: No Diarrhea?: No Constipation?: No  Constitutional Fever: No Night sweats?: No Weight loss?: No Fatigue?: No  Skin Skin rash/lesions?: No                             Physical Exam: BP 119/82 (BP Location: Left Arm, Patient Position: Sitting, Cuff Size: Large)   Pulse 78   Ht 5\' 5"  (1.651 m)   Wt 66.2 kg (146 lb)   BMI 24.30 kg/m   Constitutional:  Alert and oriented, No acute distress.   Laboratory Data: Lab Results  Component Value Date   WBC 5.4 02/22/2016   HGB 14.6 02/22/2016   HCT 43.0 02/22/2016   MCV 87.1 02/22/2016   PLT 142 (L) 02/22/2016    Lab Results  Component Value Date   CREATININE 1.09 (H) 02/22/2016    No results found for: PSA  No results found for: TESTOSTERONE  No results found for: HGBA1C  Urinalysis    Component Value Date/Time   BILIRUBINUR negative 08/18/2015 1103   PROTEINUR negative 08/18/2015 1103   UROBILINOGEN 0.2 08/18/2015 1103   NITRITE negative  08/18/2015 1103   LEUKOCYTESUR Negative 08/18/2015 1103    Pertinent Imaging: none  Assessment & Plan:  I will see the patient in approxi-6 weeks on oxybutynin twice a day. Hopefully this will work well and be cheaper. I'm not certain if she has never tried Dispensing optician and I  can ask or look. Refractory therapies are an option. I will keep an eye on her bladder infections and today I sent the urine for culture  1. Urinary frequency 2. Mixed incontinence  - Urinalysis, Complete   No Follow-up on file.  Reece Packer, MD  [facility] [address] [place], [place] [address] [phone]

## 2016-07-12 LAB — CULTURE, URINE COMPREHENSIVE

## 2016-08-07 DIAGNOSIS — I25810 Atherosclerosis of coronary artery bypass graft(s) without angina pectoris: Secondary | ICD-10-CM | POA: Diagnosis not present

## 2016-08-07 DIAGNOSIS — I10 Essential (primary) hypertension: Secondary | ICD-10-CM | POA: Diagnosis not present

## 2016-08-07 DIAGNOSIS — E782 Mixed hyperlipidemia: Secondary | ICD-10-CM | POA: Diagnosis not present

## 2016-08-07 DIAGNOSIS — I482 Chronic atrial fibrillation: Secondary | ICD-10-CM | POA: Diagnosis not present

## 2016-08-20 ENCOUNTER — Ambulatory Visit
Admission: RE | Admit: 2016-08-20 | Discharge: 2016-08-20 | Disposition: A | Discharge status: Home / Self Care | Payer: Medicare Other | Source: Ambulatory Visit | Attending: Internal Medicine | Admitting: Internal Medicine

## 2016-08-20 ENCOUNTER — Ambulatory Visit (INDEPENDENT_AMBULATORY_CARE_PROVIDER_SITE_OTHER): Payer: Medicare Other | Admitting: Urology

## 2016-08-20 ENCOUNTER — Encounter: Payer: Self-pay | Admitting: Urology

## 2016-08-20 VITALS — BP 134/83 | HR 120 | Ht 65.0 in | Wt 147.9 lb

## 2016-08-20 DIAGNOSIS — K561 Intussusception: Secondary | ICD-10-CM | POA: Diagnosis not present

## 2016-08-20 DIAGNOSIS — C8308 Small cell B-cell lymphoma, lymph nodes of multiple sites: Secondary | ICD-10-CM | POA: Insufficient documentation

## 2016-08-20 DIAGNOSIS — C8512 Unspecified B-cell lymphoma, intrathoracic lymph nodes: Secondary | ICD-10-CM | POA: Diagnosis not present

## 2016-08-20 DIAGNOSIS — R1909 Other intra-abdominal and pelvic swelling, mass and lump: Secondary | ICD-10-CM | POA: Diagnosis not present

## 2016-08-20 DIAGNOSIS — R350 Frequency of micturition: Secondary | ICD-10-CM

## 2016-08-20 DIAGNOSIS — N838 Other noninflammatory disorders of ovary, fallopian tube and broad ligament: Secondary | ICD-10-CM

## 2016-08-20 LAB — URINALYSIS, COMPLETE
Bilirubin, UA: NEGATIVE
GLUCOSE, UA: NEGATIVE
KETONES UA: NEGATIVE
Leukocytes, UA: NEGATIVE
NITRITE UA: NEGATIVE
Protein, UA: NEGATIVE
RBC, UA: NEGATIVE
Specific Gravity, UA: 1.01 (ref 1.005–1.030)
UUROB: 0.2 mg/dL (ref 0.2–1.0)
pH, UA: 7 (ref 5.0–7.5)

## 2016-08-20 LAB — POCT I-STAT CREATININE: Creatinine, Ser: 1.1 mg/dL — ABNORMAL HIGH (ref 0.44–1.00)

## 2016-08-20 LAB — MICROSCOPIC EXAMINATION

## 2016-08-20 MED ORDER — IOPAMIDOL (ISOVUE-300) INJECTION 61%
80.0000 mL | Freq: Once | INTRAVENOUS | Status: AC | PRN
  Administered 2016-08-20: 80 mL via INTRAVENOUS

## 2016-08-20 MED ORDER — TOLTERODINE TARTRATE ER 4 MG PO CP24: 4.0000 mg | ORAL_CAPSULE | Freq: Every day | ORAL | 11 refills | Status: DC

## 2016-08-20 NOTE — Progress Notes (Signed)
08/20/2016 10:39 AM   Jacqueline Webb [birth year] [id number]  Referring provider: [name], MD [address] [place], [place] [address]  Chief Complaint  Patient presents with  . Urinary Frequency    HPI: The patient has primarily urge incontinence refractory to medication as well as nighttime frequency. Last visit Jacqueline Webb had greatly improved her symptoms. Because of cost she was switched to oxybutynin which helped 50%. The once a day oxybutynin was expensive so switched to twice a day. The last 2 urine cultures were negative  50% improved on twice a day oxybutynin. Frequency is stable. She has good days and bad days. She can hold on the bit longer. She still uses pads especially if she holds longer   PMH: Past Medical History:  Diagnosis Date  . A-fib ([name])   . Benign neoplasm of rectum and anal canal   . 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 Hypercholesteremia   . Hypertension   . Hyperthyroidism   . Lymphoma ([name]) 2007  . Non Hodgkin's lymphoma ([name])   . Non Hodgkin's lymphoma ([name])   . Non Hodgkin's lymphoma ([name]) 2008  . Non Hodgkin's lymphoma ([name]) 01/22/2006  . Personal history of chemotherapy     Surgical History: Past Surgical History:  Procedure Laterality Date  . ABDOMINAL HYSTERECTOMY  1958  . APPENDECTOMY    . BREAST BIOPSY Right 1970   right breast biopsy with clip- neg  . BREAST BIOPSY Left 2002   left breast biopsy with clip-neg  . BREAST BIOPSY Right 2014   right breast biopsy with clip-neg  . BREAST CYST ASPIRATION Bilateral 2000   bilateral fine needle aspiration  . BREAST SURGERY Left 1990   biopsy  . BREAST SURGERY Right May 08, 2012   complex fibroadenoma without malignancy.  [name] CARDIAC CATHETERIZATION  2014  . COLONOSCOPY  2009, 2012    hyperplastic rectal polyp 2012 tubular adenoma of proximal ascending colon 2000 9 repeat exam due to 2017.  [name] CORONARY  ARTERY BYPASS GRAFT  1999  . FLEXIBLE SIGMOIDOSCOPY  1998    Home Medications:  Allergies as of 08/20/2016   No Known Allergies     Medication List       Accurate as of 78/30/18 10:39 AM. Always use your most recent med list.          levothyroxine 88 MCG tablet Commonly known as:  SYNTHROID, LEVOTHROID TAKE 1 TABLET BY MOUTH  DAILY BEFORE BREAKFAST   lisinopril-hydrochlorothiazide 20-12.5 MG tablet Commonly known as:  PRINZIDE,ZESTORETIC TAKE 1 TABLET BY MOUTH  DAILY   Melatonin 1 MG Caps Take 1 capsule by mouth at bedtime.   metoprolol succinate 25 MG 24 hr tablet Commonly known as:  TOPROL-XL Take 25 mg by mouth daily.   oxybutynin 5 MG tablet Commonly known as:  DITROPAN Take 1 tablet (5 mg total) by mouth 2 (two) times daily.   tolterodine 4 MG 24 hr capsule Commonly known as:  DETROL LA Take 1 capsule (4 mg total) by mouth daily.   XARELTO 20 MG Tabs tablet Generic drug:  rivaroxaban Take 20 mg by mouth daily with supper.       Allergies: No Known Allergies  Family History: Family History  Problem Relation Age of Onset  . Lung cancer Father        Died age 69  . Heart disease Mother        Died age  84  . Asthma Mother   . CAD Brother        double bypass  . Heart attack Sister   . Tuberculosis Sister        19 years old  . Lung cancer Sister        Died in her 10s  . Angina Sister   . Breast cancer Sister 44  . Rheum arthritis Sister   . Aneurysm Sister        Brain, died age 7  . Angina Sister   . Heart disease Brother        Stent placement  . Heart disease Brother        stent placement  . Cancer Other        breast cancer niece, pancreatic cancer niece  . Breast cancer Other   . Ovarian cancer Neg Hx   . Diabetes Neg Hx     Social History:  reports that she has never smoked. She has never used smokeless tobacco. She reports that she drinks about 1.2 - 1.8 oz of alcohol per week . She reports that she does not use  drugs.  ROS: UROLOGY Frequent Urination?: Yes Hard to postpone urination?: No Burning/pain with urination?: No Get up at night to urinate?: Yes Leakage of urine?: Yes Urine stream starts and stops?: No Trouble starting stream?: No Do you have to strain to urinate?: No Blood in urine?: No Urinary tract infection?: No Sexually transmitted disease?: No Injury to kidneys or bladder?: No Painful intercourse?: No Weak stream?: No Currently pregnant?: No Vaginal bleeding?: No Last menstrual period?: n  Gastrointestinal Nausea?: No Vomiting?: No Indigestion/heartburn?: No Diarrhea?: No Constipation?: No  Constitutional Fever: No Night sweats?: No Weight loss?: No Fatigue?: No  Skin Skin rash/lesions?: No Itching?: No  Eyes Blurred vision?: No Double vision?: No  Ears/Nose/Throat Sore throat?: No Sinus problems?: No  Hematologic/Lymphatic Swollen glands?: Yes Easy bruising?: No  Cardiovascular Leg swelling?: Yes Chest pain?: No  Respiratory Cough?: No Shortness of breath?: No  Endocrine Excessive thirst?: No  Musculoskeletal Back pain?: No Joint pain?: No  Neurological Headaches?: No Dizziness?: No  Psychologic Depression?: No Anxiety?: No  Physical Exam: BP 134/83 (BP Location: Left Arm, Patient Position: Sitting, Cuff Size: Normal)   Pulse (!) 120   Ht 5\' 5"  (1.651 m)   Wt 147 lb 14.4 oz (67.1 kg)   BMI 24.61 kg/m   Constitutional:  Alert and oriented, No acute distress.   Laboratory Data: Lab Results  Component Value Date   WBC 5.4 02/22/2016   HGB 14.6 02/22/2016   HCT 43.0 02/22/2016   MCV 87.1 02/22/2016   PLT 142 (L) 02/22/2016    Lab Results  Component Value Date   CREATININE 1.09 (H) 02/22/2016    No results found for: PSA  No results found for: TESTOSTERONE  No results found for: HGBA1C  Urinalysis    Component Value Date/Time   APPEARANCEUR Clear 07/09/2016 1008   GLUCOSEU Negative 07/09/2016 1008    BILIRUBINUR Negative 07/09/2016 1008   PROTEINUR Negative 07/09/2016 1008   UROBILINOGEN 0.2 08/18/2015 1103   NITRITE Negative 07/09/2016 1008   LEUKOCYTESUR Trace (A) 07/09/2016 1008    Pertinent Imaging: none  Assessment & Plan:  The patient will be reassessed in 6 weeks on Detrol 4 mg. I will then educate her about the 3 neuromodulation treatments. She would like to be improved on medication first more invasive therapies. We both agreed that education about the other therapies  will be prudent to consider if she has not reacher treatment goal  1. Urinary frequency 2. Urgency incontinence   - Urinalysis, Complete   No Follow-up on file.  Reece Packer, MD  [facility] [address] [place], [place] [address] [phone]

## 2016-08-21 ENCOUNTER — Ambulatory Visit: Payer: Medicare Other

## 2016-08-22 ENCOUNTER — Inpatient Hospital Stay (HOSPITAL_BASED_OUTPATIENT_CLINIC_OR_DEPARTMENT_OTHER): Payer: Medicare Other | Admitting: Internal Medicine

## 2016-08-22 ENCOUNTER — Inpatient Hospital Stay: Payer: Medicare Other | Attending: Internal Medicine

## 2016-08-22 ENCOUNTER — Telehealth: Payer: Self-pay | Admitting: Internal Medicine

## 2016-08-22 VITALS — BP 152/92 | HR 83 | Temp 99.1°F | Resp 20 | Ht 65.0 in | Wt 146.8 lb

## 2016-08-22 DIAGNOSIS — E7800 Pure hypercholesterolemia, unspecified: Secondary | ICD-10-CM

## 2016-08-22 DIAGNOSIS — E0590 Thyrotoxicosis, unspecified without thyrotoxic crisis or storm: Secondary | ICD-10-CM

## 2016-08-22 DIAGNOSIS — I4891 Unspecified atrial fibrillation: Secondary | ICD-10-CM | POA: Insufficient documentation

## 2016-08-22 DIAGNOSIS — Z79899 Other long term (current) drug therapy: Secondary | ICD-10-CM | POA: Insufficient documentation

## 2016-08-22 DIAGNOSIS — I10 Essential (primary) hypertension: Secondary | ICD-10-CM

## 2016-08-22 DIAGNOSIS — K561 Intussusception: Secondary | ICD-10-CM | POA: Insufficient documentation

## 2016-08-22 DIAGNOSIS — Z7901 Long term (current) use of anticoagulants: Secondary | ICD-10-CM

## 2016-08-22 DIAGNOSIS — Z803 Family history of malignant neoplasm of breast: Secondary | ICD-10-CM | POA: Insufficient documentation

## 2016-08-22 DIAGNOSIS — Z9221 Personal history of antineoplastic chemotherapy: Secondary | ICD-10-CM | POA: Insufficient documentation

## 2016-08-22 DIAGNOSIS — Z801 Family history of malignant neoplasm of trachea, bronchus and lung: Secondary | ICD-10-CM | POA: Diagnosis not present

## 2016-08-22 DIAGNOSIS — D696 Thrombocytopenia, unspecified: Secondary | ICD-10-CM

## 2016-08-22 DIAGNOSIS — C8308 Small cell B-cell lymphoma, lymph nodes of multiple sites: Secondary | ICD-10-CM | POA: Diagnosis not present

## 2016-08-22 DIAGNOSIS — N838 Other noninflammatory disorders of ovary, fallopian tube and broad ligament: Secondary | ICD-10-CM

## 2016-08-22 LAB — CBC WITH DIFFERENTIAL/PLATELET
BASOS PCT: 2 %
Basophils Absolute: 0.1 10*3/uL (ref 0–0.1)
EOS PCT: 2 %
Eosinophils Absolute: 0.1 10*3/uL (ref 0–0.7)
HCT: 41.1 % (ref 35.0–47.0)
Hemoglobin: 14.1 g/dL (ref 12.0–16.0)
LYMPHS PCT: 30 %
Lymphs Abs: 1.2 10*3/uL (ref 1.0–3.6)
MCH: 29.2 pg (ref 26.0–34.0)
MCHC: 34.4 g/dL (ref 32.0–36.0)
MCV: 84.8 fL (ref 80.0–100.0)
Monocytes Absolute: 0.4 10*3/uL (ref 0.2–0.9)
Monocytes Relative: 10 %
NEUTROS ABS: 2.2 10*3/uL (ref 1.4–6.5)
Neutrophils Relative %: 56 %
PLATELETS: 113 10*3/uL — AB (ref 150–440)
RBC: 4.85 MIL/uL (ref 3.80–5.20)
RDW: 15.4 % — ABNORMAL HIGH (ref 11.5–14.5)
WBC: 3.9 10*3/uL (ref 3.6–11.0)

## 2016-08-22 LAB — COMPREHENSIVE METABOLIC PANEL
ALBUMIN: 4.2 g/dL (ref 3.5–5.0)
ALT: 25 U/L (ref 14–54)
AST: 29 U/L (ref 15–41)
Alkaline Phosphatase: 55 U/L (ref 38–126)
Anion gap: 6 (ref 5–15)
BUN: 22 mg/dL — AB (ref 6–20)
CHLORIDE: 104 mmol/L (ref 101–111)
CO2: 29 mmol/L (ref 22–32)
CREATININE: 1.14 mg/dL — AB (ref 0.44–1.00)
Calcium: 10.1 mg/dL (ref 8.9–10.3)
GFR calc Af Amer: 52 mL/min — ABNORMAL LOW (ref >60)
GFR, EST NON AFRICAN AMERICAN: 45 mL/min — AB (ref >60)
GLUCOSE: 92 mg/dL (ref 65–99)
POTASSIUM: 3.9 mmol/L (ref 3.5–5.1)
Sodium: 139 mmol/L (ref 135–145)
Total Bilirubin: 0.8 mg/dL (ref 0.3–1.2)
Total Protein: 6.9 g/dL (ref 6.5–8.1)

## 2016-08-22 LAB — LACTATE DEHYDROGENASE: LDH: 166 U/L (ref 98–192)

## 2016-08-22 NOTE — Progress Notes (Signed)
[place] OFFICE PROGRESS NOTE  Patient Care Team: [name], MD as PCP - General (Unknown Physician Specialty) Bary Castilla, Forest Gleason, MD (General Surgery) Forest Gleason, MD (Oncology) Corey Skains, MD as Consulting Physician (Cardiology) Anell Barr, OD as Consulting Physician (Optometry)  Cancer Staging Lymphoma [facility]) Staging form: Lymphoid Neoplasms, AJCC 6th Edition - Clinical: No stage assigned - Unsigned    Oncology History   1. Lymphoma low-grade, status Rituxan therapy. 2. Recurrent disease by clinical examination in December of 2012 3. Ovarian mass on PET scan in December of 2012 (normal CA 125). Ovarian mass has resolved after rituximab therapy. 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  Patient had total 5 cycles of chemotherapy with bendamustine and rituximab and 6 cycle was omitted because of significant side effects with and weakness and fatigue.  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  Patient notes to have prominent right and left axillary lymph nodes; one small lymph node in the right neck. She also notes feeling "knots"- left groin especially when standing. Otherwise denies  any night sweats fevers or chills. Denies any extreme fatigue or weight loss.  Patient is very anxious. Denies abdominal pain but denies any nausea vomiting constipation or diarrhea.  REVIEW OF SYSTEMS:  A complete 10 point review of system is done which is negative except mentioned above/history of present illness.   PAST MEDICAL HISTORY :  Past Medical History:  Diagnosis Date  . A-fib ([place])   . Benign neoplasm of rectum and anal canal   . 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 Hypercholesteremia   . Hypertension   . Hyperthyroidism   . Lymphoma ([place]) 2007  . Non Hodgkin's lymphoma ([place])   . Non Hodgkin's lymphoma ([place])   . Non Hodgkin's lymphoma ([place]) 2008  . Non Hodgkin's lymphoma ([place]) 01/22/2006  . Personal history of chemotherapy     PAST SURGICAL HISTORY :   Past Surgical History:  Procedure Laterality Date  . ABDOMINAL HYSTERECTOMY  1958  . APPENDECTOMY    . BREAST BIOPSY Right 1970   right breast biopsy with clip- neg  . BREAST BIOPSY Left 2002   left breast biopsy with clip-neg  . BREAST BIOPSY Right 2014   right breast biopsy with clip-neg  . BREAST CYST ASPIRATION Bilateral 2000   bilateral fine needle aspiration  . BREAST SURGERY Left 1990   biopsy  . BREAST SURGERY Right May 08, 2012   complex fibroadenoma without malignancy.  [name] CARDIAC CATHETERIZATION  2014  . COLONOSCOPY  2009, 2012    hyperplastic rectal polyp 2012 tubular adenoma of proximal ascending colon 2000  9 repeat exam due to 2017.  [name] CORONARY ARTERY BYPASS GRAFT  1999  . FLEXIBLE SIGMOIDOSCOPY  1998    FAMILY HISTORY :   Family History  Problem Relation Age of Onset  . Lung cancer Father        Died age 16  . Heart disease Mother        Died age 77  . Asthma Mother   . CAD Brother        double bypass  . Heart attack Sister   . Tuberculosis Sister        17 years old  . Lung cancer Sister        Died in her 1s  . Angina  Sister   . Breast cancer Sister 63  . Rheum arthritis Sister   . Aneurysm Sister        Brain, died age 43  . Angina Sister   . Heart disease Brother        Stent placement  . Heart disease Brother        stent placement  . Cancer Other        breast cancer niece, pancreatic cancer niece  . Breast cancer Other   . Ovarian cancer Neg Hx   . Diabetes Neg Hx     SOCIAL HISTORY:   Social History  Substance Use Topics  . Smoking status: Never Smoker  . Smokeless tobacco: Never Used  . Alcohol use 1.2 - 1.8 oz/week    1 - 2 Glasses of wine, 1 Shots of liquor per week     Comment: daily-     ALLERGIES:  has No Known Allergies.  MEDICATIONS:  Current Outpatient Prescriptions  Medication Sig Dispense Refill  . levothyroxine (SYNTHROID, LEVOTHROID) 88 MCG tablet TAKE 1 TABLET BY MOUTH  DAILY BEFORE BREAKFAST 90 tablet 3  . lisinopril-hydrochlorothiazide (PRINZIDE,ZESTORETIC) 20-12.5 MG tablet TAKE 1 TABLET BY MOUTH  DAILY 90 tablet 3  . Melatonin 1 MG CAPS Take 1 capsule by mouth at bedtime.     . metoprolol succinate (TOPROL-XL) 25 MG 24 hr tablet Take 25 mg by mouth daily.     [name] tolterodine (DETROL LA) 4 MG 24 hr capsule Take 1 capsule (4 mg total) by mouth daily. 30 capsule 11  . XARELTO 20 MG TABS tablet Take 20 mg by mouth daily with supper.      No current facility-administered medications for this visit.     PHYSICAL EXAMINATION: ECOG PERFORMANCE STATUS: 0 - Asymptomatic  BP (!) 152/92 (BP Location: Left Arm, Patient Position: Sitting)   Pulse 83   Temp 99.1 F (37.3 C) (Tympanic)   Resp 20   Ht 5\' 5"  (1.651 m)   Wt 146 lb 12.8 oz (66.6 kg)   BMI 24.43 kg/m   Filed Weights   08/22/16 1101  Weight: 146 lb 12.8 oz (66.6 kg)    GENERAL: Well-nourished well-developed; Alert, no distress and comfortable.  Accompanied by her husband. EYES: no pallor or icterus OROPHARYNX: no thrush or ulceration; good dentition  NECK: supple, no masses felt LYMPH: Bil rubbery  1-2 cm palpable lymphadenopathy in the axillary region; 1 cm right level II  LUNGS: clear to auscultation and  No wheeze or crackles HEART/CVS: regular rate & rhythm and no murmurs; No lower extremity edema ABDOMEN:abdomen soft, non-tender and normal bowel sounds Musculoskeletal:no cyanosis of digits and no clubbing  PSYCH: alert & oriented x 3 with fluent speech NEURO: no focal motor/sensory deficits SKIN:  no  rashes or significant lesions  LABORATORY DATA:  I have reviewed the data as listed    Component Value Date/Time   NA 139 08/22/2016 1024   NA 144 02/13/2016 1412   NA 141 05/11/2014 0853   K 3.9 08/22/2016 1024   K 3.6 05/11/2014 0853   CL 104 08/22/2016 1024   CL 103 05/11/2014 0853   CO2 29 08/22/2016 1024   CO2 29 05/11/2014 0853   GLUCOSE 92 08/22/2016 1024   GLUCOSE 107 (H) 05/11/2014 0853   BUN 22 (H) 08/22/2016 1024   BUN 24 02/13/2016 1412   BUN 22 (H) 05/11/2014 0853   CREATININE 1.14 (H) 08/22/2016 1024   CREATININE 1.07 (H) 05/11/2014 0853   CALCIUM 10.1 08/22/2016 1024   CALCIUM 10.1 05/11/2014 0853   PROT 6.9 08/22/2016 1024   PROT 6.6 02/13/2016 1412   PROT 7.3 05/11/2014 0853   ALBUMIN 4.2 08/22/2016 1024   ALBUMIN 4.2 02/13/2016 1412   ALBUMIN 4.7 05/11/2014 0853   AST 29 08/22/2016 1024   AST 29 05/11/2014 0853   ALT 25 08/22/2016 1024   ALT 20 05/11/2014 0853   ALKPHOS 55 08/22/2016 1024   ALKPHOS 47 05/11/2014 0853   BILITOT 0.8 08/22/2016 1024   BILITOT 0.6 02/13/2016 1412   BILITOT 1.0 05/11/2014 0853   GFRNONAA 45 (L) 08/22/2016 1024   GFRNONAA 51 (L) 05/11/2014 0853   GFRAA 52 (L) 08/22/2016 1024   GFRAA 59 (L) 05/11/2014 0853    No results found for: SPEP, UPEP  Lab Results  Component Value Date   WBC 3.9 08/22/2016   NEUTROABS 2.2 08/22/2016   HGB 14.1 08/22/2016   HCT 41.1 08/22/2016   MCV 84.8 08/22/2016   PLT 113 (L) 08/22/2016      Chemistry      Component Value Date/Time   NA 139 08/22/2016 1024   NA 144  02/13/2016 1412   NA 141 05/11/2014 0853   K 3.9 08/22/2016 1024   K 3.6 05/11/2014 0853   CL 104 08/22/2016 1024   CL 103 05/11/2014 0853   CO2 29 08/22/2016 1024   CO2 29 05/11/2014 0853   BUN 22 (H) 08/22/2016 1024   BUN 24 02/13/2016 1412   BUN 22 (H) 05/11/2014 0853   CREATININE 1.14 (H) 08/22/2016 1024   CREATININE 1.07 (H) 05/11/2014 0853   GLU 98 05/25/2013      Component Value Date/Time   CALCIUM 10.1 08/22/2016 1024   CALCIUM 10.1 05/11/2014 0853   ALKPHOS 55 08/22/2016 1024   ALKPHOS 47 05/11/2014 0853   AST 29 08/22/2016 1024   AST 29 05/11/2014 0853   ALT 25 08/22/2016 1024   ALT 20 05/11/2014 0853   BILITOT 0.8 08/22/2016 1024   BILITOT 0.6 02/13/2016 1412   BILITOT 1.0 05/11/2014 0853       RADIOGRAPHIC STUDIES: I have personally reviewed the radiological images as listed and agreed with the findings in the report. No results found.   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 JAN 31st PET- bilateral axillary adenopathy; mild mediastinal adenopathy- however no significant SUV uptake. July 30th CT- slight increase in the size of the left pelvic adenopathy; abdominal adenopathy approximately 2 cm in size. No significant splenomegaly; incidental finding-intussusception/left adnexal mass [C discussion below]  # Mild progression as noted on the CT scan/ also platelet count trending  down at 113. Normal hemoglobin and white count. Had a long discussion the patient regarding multiple options including- ibrutinib versus Gazyva. Discussed the potential of bleeding/A. Fib/hypertension with ibrutinib/and treatment being indefinite Gazyva-discussed the mechanism of action; highly likely hood of infusion reactions; susceptible to infections like shingles etc. Patient interested in [place] at this time.  # left ? Adnexal  mass- ? Etiology; mild SUV uptake incidental [stable compared to previous PET in 2016] Previous workup 2015- vaginal ultrasound negative. MRI February 2018- approximately 3 cm in size; slow increase in the size of the left adnexal mass over at least 3 years. Do not suspect primary gynecologic malignancy. Discussed with Dr. Fransisca Connors. Ca 125 from today is pending.  # Poor IV access- discussed regarding the port; patient interested however. No decisions were made.   # Thrombocytopenia- 113 asymptomatic; on Xarelto- likely secondary to underlying SLL. Discussed the concerns of bleeding. However monitor for now.  # Incidental intussusception is noted on the CT scan- transient asymptomatic. Recommend CT enterography as recommended in 2 months. Fredricka Bonine to next visit.  # Follow-up with me in approximately 2 months/labs/scan prior.  # I reviewed the blood work- with the patient in detail; also reviewed the imaging independently [as summarized above]; and with the patient in detail.    Orders Placed This Encounter  Procedures  . CT ENTERO ABD/PELVIS W CONTAST    Standing Status:   Future    Standing Expiration Date:   11/22/2017    Order Specific Question:   If indicated for the ordered procedure, I authorize the administration of contrast media per Radiology protocol    Answer:   Yes    Order Specific Question:   Reason for Exam (SYMPTOM  OR DIAGNOSIS REQUIRED)    Answer:   Intussuception    Order Specific Question:   Preferred imaging location?    Answer:   [facility]    Order Specific Question:   Radiology Contrast Protocol - do NOT remove file path    Answer:   [email or web address]  . CBC with Differential/Platelet    Standing Status:   Future    Standing Expiration Date:   08/22/2017  . Comprehensive metabolic panel    Standing Status:   Future    Standing Expiration Date:   08/22/2017  . Lactate dehydrogenase    Standing Status:   Future    Standing Expiration  Date:   08/22/2017   All questions were answered. The patient knows to call the clinic with any problems, questions or concerns.      Cammie Sickle, MD 08/22/2016 1:21 PM

## 2016-08-22 NOTE — Assessment & Plan Note (Addendum)
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 JAN 31st PET- bilateral axillary adenopathy; mild mediastinal adenopathy- however no significant SUV uptake. July 30th CT- slight increase in the size of the left pelvic adenopathy; abdominal adenopathy approximately 2 cm in size. No significant splenomegaly; incidental finding-intussusception/left adnexal mass [C discussion below]  # Mild progression as noted on the CT scan/ also platelet count trending down at 113. Normal hemoglobin and white count. Had a long discussion the patient regarding multiple options including- ibrutinib versus Gazyva. Discussed the potential of bleeding/A. Fib/hypertension with ibrutinib/and treatment being indefinite Gazyva-discussed the mechanism of action; highly likely hood of infusion reactions; susceptible to infections like shingles etc. Patient interested in [place] at this time.  # left ? Adnexal mass- ? Etiology; mild SUV uptake incidental [stable compared to previous PET in 2016] Previous workup 2015- vaginal ultrasound negative. MRI February 2018- approximately 3 cm in size; slow increase in the size of the left adnexal mass over at least 3 years. Do not suspect primary gynecologic malignancy. Discussed with Dr. Fransisca Connors. Ca 125 from today is pending.  # Poor IV access- discussed regarding the port; patient interested however. No decisions were made.   # Thrombocytopenia- 113 asymptomatic; on Xarelto- likely secondary to underlying SLL. Discussed the concerns of bleeding. However monitor for now.  # Incidental intussusception is noted on the CT scan- transient asymptomatic. Recommend CT enterography as recommended in 2 months. Fredricka Bonine to next visit.  # Follow-up with me in approximately 2 months/labs/scan prior.  # I reviewed the blood work- with the patient in detail; also reviewed the imaging independently [as summarized above]; and with the patient in  detail.

## 2016-08-22 NOTE — Telephone Encounter (Signed)
Per Heather/Kendra, cancel DOPPLER.

## 2016-08-23 LAB — CA 125: Cancer Antigen (CA) 125: 26 U/mL (ref 0.0–38.1)

## 2016-10-03 ENCOUNTER — Ambulatory Visit (INDEPENDENT_AMBULATORY_CARE_PROVIDER_SITE_OTHER): Payer: Medicare Other | Admitting: Family Medicine

## 2016-10-03 ENCOUNTER — Encounter: Payer: Self-pay | Admitting: Urology

## 2016-10-03 ENCOUNTER — Ambulatory Visit (INDEPENDENT_AMBULATORY_CARE_PROVIDER_SITE_OTHER): Payer: Medicare Other | Admitting: Urology

## 2016-10-03 VITALS — BP 107/69 | HR 99 | Ht 65.0 in | Wt 146.3 lb

## 2016-10-03 DIAGNOSIS — Z23 Encounter for immunization: Secondary | ICD-10-CM

## 2016-10-03 DIAGNOSIS — R350 Frequency of micturition: Secondary | ICD-10-CM

## 2016-10-03 DIAGNOSIS — N3946 Mixed incontinence: Secondary | ICD-10-CM | POA: Diagnosis not present

## 2016-10-03 NOTE — Progress Notes (Signed)
10/03/2016 2:27 PM   Jacqueline Webb 01-17-1979 [id number]  Referring provider: [name], MD [address] [place],  [address]  No chief complaint on file.   HPI: The patient has primarily urge incontinence refractory to medication as well as nighttime frequency. Last visit Jacqueline Webb had greatly improved her symptoms. Because of cost she was switched to oxybutynin which helped 50%. The once a day oxybutynin was expensive so switched to twice a day. The last 2 urine cultures were negative  50% improved on twice a day oxybutynin. Frequency is stable. She has good days and bad days. She can hold on the bit longer. She still uses pads especially if she holds longer  The patient will be reassessed in 6 weeks on Detrol 4 mg. I will then educate her about the 3 neuromodulation treatments. She would like to be improved on medication first more invasive therapies. We both agreed that education about the other therapies will be prudent to consider if she has not reached her treatment goal  Today The patient has done the best with oxybutynin 5 mg twice a day. She has good days and bad days and had a bad weekend. I will call the urine culture is positive. The Detrol worked about the same and Jacqueline Webb was too expensive at $40 a month.  Clinically otherwise not infected  I talked about all 3 refractory therapies with my usual template noted.  She has another recurrence of non-Hodgkin's lymphoma and likely will have chemotherapy for the third or fourth time.   She does have ankle edema. I did not think studies were ideal for her   PMH: Past Medical History:  Diagnosis Date  . A-fib ([place])   . Benign neoplasm of rectum and anal canal   . 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 Hypercholesteremia   . Hypertension   . Hyperthyroidism   . Lymphoma ([place]) 2007  . Non Hodgkin's lymphoma ([place])   . Non Hodgkin's  lymphoma ([place])   . Non Hodgkin's lymphoma ([place]) 2008  . Non Hodgkin's lymphoma ([place]) 01/22/2006  . Personal history of chemotherapy     Surgical History: Past Surgical History:  Procedure Laterality Date  . ABDOMINAL HYSTERECTOMY  1958  . APPENDECTOMY    . BREAST BIOPSY Right 1970   right breast biopsy with clip- neg  . BREAST BIOPSY Left 2002   left breast biopsy with clip-neg  . BREAST BIOPSY Right 2014   right breast biopsy with clip-neg  . BREAST CYST ASPIRATION Bilateral 2000   bilateral fine needle aspiration  . BREAST SURGERY Left 1990   biopsy  . BREAST SURGERY Right May 08, 2012   complex fibroadenoma without malignancy.  [name] CARDIAC CATHETERIZATION  2014  . COLONOSCOPY  2009, 2012    hyperplastic rectal polyp 2012 tubular adenoma of proximal ascending colon 2000 9 repeat exam due to 2017.  [name] CORONARY ARTERY BYPASS GRAFT  1999  . FLEXIBLE SIGMOIDOSCOPY  1998    Home Medications:  Allergies as of 10/03/2016   No Known Allergies     Medication List       Accurate as of 10/03/16  2:27 PM. Always use your most recent med list.          levothyroxine 88 MCG tablet Commonly known as:  SYNTHROID, LEVOTHROID TAKE 1 TABLET BY MOUTH  DAILY BEFORE BREAKFAST   lisinopril-hydrochlorothiazide 20-12.5 MG tablet Commonly known as:  PRINZIDE,ZESTORETIC  TAKE 1 TABLET BY MOUTH  DAILY   Melatonin 1 MG Caps Take 1 capsule by mouth at bedtime.   metoprolol succinate 25 MG 24 hr tablet Commonly known as:  TOPROL-XL Take 25 mg by mouth daily.   tolterodine 4 MG 24 hr capsule Commonly known as:  DETROL LA Take 1 capsule (4 mg total) by mouth daily.   XARELTO 20 MG Tabs tablet Generic drug:  rivaroxaban Take 20 mg by mouth daily with supper.       Allergies: No Known Allergies  Family History: Family History  Problem Relation Age of Onset  . Lung cancer Father        Died age 22  . Heart disease Mother        Died age 62  . Asthma Mother   . CAD Brother         double bypass  . Heart attack Sister   . Tuberculosis Sister        24 years old  . Lung cancer Sister        Died in her 62s  . Angina Sister   . Breast cancer Sister 15  . Rheum arthritis Sister   . Aneurysm Sister        Brain, died age 14  . Angina Sister   . Heart disease Brother        Stent placement  . Heart disease Brother        stent placement  . Cancer Other        breast cancer niece, pancreatic cancer niece  . Breast cancer Other   . Ovarian cancer Neg Hx   . Diabetes Neg Hx     Social History:  reports that she has never smoked. She has never used smokeless tobacco. She reports that she drinks about 1.2 - 1.8 oz of alcohol per week . She reports that she does not use drugs.  ROS:                                        Physical Exam: There were no vitals taken for this visit.  Constitutional:  Alert and oriented, No acute distress.   Laboratory Data: Lab Results  Component Value Date   WBC 3.9 08/22/2016   HGB 14.1 08/22/2016   HCT 41.1 08/22/2016   MCV 84.8 08/22/2016   PLT 113 (L) 08/22/2016    Lab Results  Component Value Date   CREATININE 1.14 (H) 08/22/2016    No results found for: PSA  No results found for: TESTOSTERONE  No results found for: HGBA1C  Urinalysis    Component Value Date/Time   APPEARANCEUR Clear 08/20/2016 1018   GLUCOSEU Negative 08/20/2016 1018   BILIRUBINUR Negative 08/20/2016 1018   PROTEINUR Negative 08/20/2016 1018   UROBILINOGEN 0.2 08/18/2015 1103   NITRITE Negative 08/20/2016 1018   LEUKOCYTESUR Negative 08/20/2016 1018    Pertinent Imaging: none  Assessment & Plan:  We elected to regroup in 6 months and likely her long-term treatment will be oxybutynin twice a day  There are no diagnoses linked to this encounter.  No Follow-up on file.  Reece Packer, MD  [facility] [address], [place], [place] [address] [phone]

## 2016-10-04 LAB — MICROSCOPIC EXAMINATION: RBC, UA: NONE SEEN /hpf (ref 0–?)

## 2016-10-04 LAB — URINALYSIS, COMPLETE
BILIRUBIN UA: NEGATIVE
GLUCOSE, UA: NEGATIVE
KETONES UA: NEGATIVE
Nitrite, UA: NEGATIVE
Protein, UA: NEGATIVE
SPEC GRAV UA: 1.015 (ref 1.005–1.030)
Urobilinogen, Ur: 0.2 mg/dL (ref 0.2–1.0)
pH, UA: 6 (ref 5.0–7.5)

## 2016-10-08 LAB — CULTURE, URINE COMPREHENSIVE

## 2016-10-16 ENCOUNTER — Inpatient Hospital Stay: Payer: Medicare Other | Attending: Internal Medicine

## 2016-10-16 DIAGNOSIS — C8308 Small cell B-cell lymphoma, lymph nodes of multiple sites: Secondary | ICD-10-CM

## 2016-10-16 DIAGNOSIS — C8300 Small cell B-cell lymphoma, unspecified site: Secondary | ICD-10-CM | POA: Insufficient documentation

## 2016-10-16 DIAGNOSIS — K561 Intussusception: Secondary | ICD-10-CM

## 2016-10-16 LAB — CBC WITH DIFFERENTIAL/PLATELET
BASOS PCT: 1 %
Basophils Absolute: 0.1 10*3/uL (ref 0–0.1)
EOS ABS: 0.1 10*3/uL (ref 0–0.7)
Eosinophils Relative: 1 %
HCT: 40.2 % (ref 35.0–47.0)
HEMOGLOBIN: 13.9 g/dL (ref 12.0–16.0)
Lymphocytes Relative: 19 %
Lymphs Abs: 1 10*3/uL (ref 1.0–3.6)
MCH: 29.7 pg (ref 26.0–34.0)
MCHC: 34.6 g/dL (ref 32.0–36.0)
MCV: 85.9 fL (ref 80.0–100.0)
Monocytes Absolute: 0.4 10*3/uL (ref 0.2–0.9)
Monocytes Relative: 7 %
NEUTROS PCT: 72 %
Neutro Abs: 3.5 10*3/uL (ref 1.4–6.5)
Platelets: 97 10*3/uL — ABNORMAL LOW (ref 150–440)
RBC: 4.68 MIL/uL (ref 3.80–5.20)
RDW: 15.7 % — ABNORMAL HIGH (ref 11.5–14.5)
WBC: 5 10*3/uL (ref 3.6–11.0)

## 2016-10-16 LAB — COMPREHENSIVE METABOLIC PANEL
ALBUMIN: 4.2 g/dL (ref 3.5–5.0)
ALK PHOS: 68 U/L (ref 38–126)
ALT: 23 U/L (ref 14–54)
ANION GAP: 8 (ref 5–15)
AST: 26 U/L (ref 15–41)
BUN: 31 mg/dL — ABNORMAL HIGH (ref 6–20)
CALCIUM: 10.1 mg/dL (ref 8.9–10.3)
CO2: 27 mmol/L (ref 22–32)
CREATININE: 1.44 mg/dL — AB (ref 0.44–1.00)
Chloride: 103 mmol/L (ref 101–111)
GFR calc Af Amer: 39 mL/min — ABNORMAL LOW (ref >60)
GFR calc non Af Amer: 34 mL/min — ABNORMAL LOW (ref >60)
Glucose, Bld: 104 mg/dL — ABNORMAL HIGH (ref 65–99)
Potassium: 4 mmol/L (ref 3.5–5.1)
SODIUM: 138 mmol/L (ref 135–145)
TOTAL PROTEIN: 7.1 g/dL (ref 6.5–8.1)
Total Bilirubin: 0.8 mg/dL (ref 0.3–1.2)

## 2016-10-16 LAB — LACTATE DEHYDROGENASE: LDH: 158 U/L (ref 98–192)

## 2016-10-19 ENCOUNTER — Inpatient Hospital Stay: Payer: Medicare Other

## 2016-10-22 ENCOUNTER — Ambulatory Visit
Admission: RE | Admit: 2016-10-22 | Discharge: 2016-10-22 | Disposition: A | Discharge status: Home / Self Care | Payer: Medicare Other | Source: Ambulatory Visit | Attending: Internal Medicine | Admitting: Internal Medicine

## 2016-10-22 DIAGNOSIS — I517 Cardiomegaly: Secondary | ICD-10-CM | POA: Diagnosis not present

## 2016-10-22 DIAGNOSIS — R1900 Intra-abdominal and pelvic swelling, mass and lump, unspecified site: Secondary | ICD-10-CM | POA: Diagnosis not present

## 2016-10-22 DIAGNOSIS — R590 Localized enlarged lymph nodes: Secondary | ICD-10-CM | POA: Diagnosis not present

## 2016-10-22 DIAGNOSIS — I700 Atherosclerosis of aorta: Secondary | ICD-10-CM | POA: Diagnosis not present

## 2016-10-22 DIAGNOSIS — I2510 Atherosclerotic heart disease of native coronary artery without angina pectoris: Secondary | ICD-10-CM | POA: Diagnosis not present

## 2016-10-22 DIAGNOSIS — C8308 Small cell B-cell lymphoma, lymph nodes of multiple sites: Secondary | ICD-10-CM | POA: Insufficient documentation

## 2016-10-22 DIAGNOSIS — K561 Intussusception: Secondary | ICD-10-CM | POA: Insufficient documentation

## 2016-10-22 DIAGNOSIS — R109 Unspecified abdominal pain: Secondary | ICD-10-CM | POA: Diagnosis not present

## 2016-10-22 LAB — POCT I-STAT CREATININE: CREATININE: 1.2 mg/dL — AB (ref 0.44–1.00)

## 2016-10-22 MED ORDER — IOPAMIDOL (ISOVUE-300) INJECTION 61%
80.0000 mL | Freq: Once | INTRAVENOUS | Status: AC | PRN
  Administered 2016-10-22: 80 mL via INTRAVENOUS

## 2016-10-25 ENCOUNTER — Telehealth: Payer: Self-pay | Admitting: Internal Medicine

## 2016-10-25 ENCOUNTER — Inpatient Hospital Stay: Payer: Medicare Other | Attending: Internal Medicine | Admitting: Internal Medicine

## 2016-10-25 ENCOUNTER — Inpatient Hospital Stay: Payer: Medicare Other

## 2016-10-25 ENCOUNTER — Other Ambulatory Visit: Payer: Self-pay | Admitting: Internal Medicine

## 2016-10-25 VITALS — BP 134/82 | HR 106 | Temp 97.4°F | Resp 16 | Wt 148.0 lb

## 2016-10-25 DIAGNOSIS — Z86010 Personal history of colonic polyps: Secondary | ICD-10-CM | POA: Diagnosis not present

## 2016-10-25 DIAGNOSIS — Z7901 Long term (current) use of anticoagulants: Secondary | ICD-10-CM | POA: Insufficient documentation

## 2016-10-25 DIAGNOSIS — N189 Chronic kidney disease, unspecified: Secondary | ICD-10-CM | POA: Diagnosis not present

## 2016-10-25 DIAGNOSIS — Z9221 Personal history of antineoplastic chemotherapy: Secondary | ICD-10-CM | POA: Diagnosis not present

## 2016-10-25 DIAGNOSIS — E7800 Pure hypercholesterolemia, unspecified: Secondary | ICD-10-CM | POA: Insufficient documentation

## 2016-10-25 DIAGNOSIS — D696 Thrombocytopenia, unspecified: Secondary | ICD-10-CM | POA: Insufficient documentation

## 2016-10-25 DIAGNOSIS — I10 Essential (primary) hypertension: Secondary | ICD-10-CM | POA: Diagnosis not present

## 2016-10-25 DIAGNOSIS — I129 Hypertensive chronic kidney disease with stage 1 through stage 4 chronic kidney disease, or unspecified chronic kidney disease: Secondary | ICD-10-CM | POA: Insufficient documentation

## 2016-10-25 DIAGNOSIS — Z79899 Other long term (current) drug therapy: Secondary | ICD-10-CM | POA: Diagnosis not present

## 2016-10-25 DIAGNOSIS — R590 Localized enlarged lymph nodes: Secondary | ICD-10-CM

## 2016-10-25 DIAGNOSIS — I700 Atherosclerosis of aorta: Secondary | ICD-10-CM | POA: Insufficient documentation

## 2016-10-25 DIAGNOSIS — Z803 Family history of malignant neoplasm of breast: Secondary | ICD-10-CM | POA: Insufficient documentation

## 2016-10-25 DIAGNOSIS — I517 Cardiomegaly: Secondary | ICD-10-CM | POA: Insufficient documentation

## 2016-10-25 DIAGNOSIS — Z801 Family history of malignant neoplasm of trachea, bronchus and lung: Secondary | ICD-10-CM | POA: Insufficient documentation

## 2016-10-25 DIAGNOSIS — E0590 Thyrotoxicosis, unspecified without thyrotoxic crisis or storm: Secondary | ICD-10-CM | POA: Diagnosis not present

## 2016-10-25 DIAGNOSIS — C8308 Small cell B-cell lymphoma, lymph nodes of multiple sites: Secondary | ICD-10-CM | POA: Diagnosis not present

## 2016-10-25 DIAGNOSIS — Z5112 Encounter for antineoplastic immunotherapy: Secondary | ICD-10-CM | POA: Insufficient documentation

## 2016-10-25 DIAGNOSIS — I4891 Unspecified atrial fibrillation: Secondary | ICD-10-CM | POA: Diagnosis not present

## 2016-10-25 DIAGNOSIS — K629 Disease of anus and rectum, unspecified: Secondary | ICD-10-CM

## 2016-10-25 LAB — BASIC METABOLIC PANEL
ANION GAP: 7 (ref 5–15)
BUN: 22 mg/dL — ABNORMAL HIGH (ref 6–20)
CALCIUM: 9.8 mg/dL (ref 8.9–10.3)
CO2: 27 mmol/L (ref 22–32)
Chloride: 101 mmol/L (ref 101–111)
Creatinine, Ser: 1.24 mg/dL — ABNORMAL HIGH (ref 0.44–1.00)
GFR calc non Af Amer: 41 mL/min — ABNORMAL LOW (ref >60)
GFR, EST AFRICAN AMERICAN: 47 mL/min — AB (ref >60)
Glucose, Bld: 115 mg/dL — ABNORMAL HIGH (ref 65–99)
POTASSIUM: 3.9 mmol/L (ref 3.5–5.1)
Sodium: 135 mmol/L (ref 135–145)

## 2016-10-25 LAB — CBC WITH DIFFERENTIAL/PLATELET
BASOS ABS: 0.1 10*3/uL (ref 0–0.1)
BASOS PCT: 2 %
Eosinophils Absolute: 0.1 10*3/uL (ref 0–0.7)
Eosinophils Relative: 2 %
HEMATOCRIT: 39.8 % (ref 35.0–47.0)
HEMOGLOBIN: 13.5 g/dL (ref 12.0–16.0)
Lymphocytes Relative: 22 %
Lymphs Abs: 1 10*3/uL (ref 1.0–3.6)
MCH: 29.7 pg (ref 26.0–34.0)
MCHC: 34 g/dL (ref 32.0–36.0)
MCV: 87.2 fL (ref 80.0–100.0)
Monocytes Absolute: 0.5 10*3/uL (ref 0.2–0.9)
Monocytes Relative: 11 %
NEUTROS ABS: 2.9 10*3/uL (ref 1.4–6.5)
NEUTROS PCT: 63 %
Platelets: 87 10*3/uL — ABNORMAL LOW (ref 150–440)
RBC: 4.56 MIL/uL (ref 3.80–5.20)
RDW: 15.6 % — AB (ref 11.5–14.5)
WBC: 4.7 10*3/uL (ref 3.6–11.0)

## 2016-10-25 MED ORDER — ACYCLOVIR 400 MG PO TABS: 400.0000 mg | ORAL_TABLET | Freq: Every day | ORAL | 5 refills | Status: DC

## 2016-10-25 MED ORDER — CHLORAMBUCIL 2 MG PO TABS: 0.5000 mg/kg | ORAL_TABLET | ORAL | 5 refills | Status: DC

## 2016-10-25 MED ORDER — DEXAMETHASONE 4 MG PO TABS: ORAL_TABLET | ORAL | 3 refills | Status: DC

## 2016-10-25 MED ORDER — SULFAMETHOXAZOLE-TRIMETHOPRIM 800-160 MG PO TABS: 1.0000 | ORAL_TABLET | ORAL | 5 refills | Status: DC

## 2016-10-25 MED ORDER — MONTELUKAST SODIUM 10 MG PO TABS: 10.0000 mg | ORAL_TABLET | Freq: Every day | ORAL | 0 refills | Status: DC

## 2016-10-25 NOTE — Assessment & Plan Note (Addendum)
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 CT SEP 27th 2018-worsening axillary; mediastinal; retroperitoneal pelvic lymphadenopathy. Platelet count 90s.  # Proceed with Gazyva infusion. Discussed the potential infusion reactions. Recommend premedications. Discussed treatments are palliative/to control the disease; and not curative.  # left ? Adnexal mass- ? Etiology; mild SUV uptake incidental [stable compared to previous PET in 2016] Previous workup 2015- vaginal ultrasound negative. MRI February 2018- approximately 3 cm in size; slow increase in the size of the left adnexal mass over at least 3 years- suspect involvement of lymphoma rather than a primary gynecologic malignancy.  # Poor IV access- discussed regarding the port; patient interested however. No decisions were made.   # Thrombocytopenia- 90s asymptomatic; on Xarelto- likely secondary to underlying SLL. This should improve after starting treatment.  # Rectal thickening- on the CT scan incidentally. Patient is asymptomatic. Last colonoscopy more than 10 years ago. Discussed with Dr. Charissa Bash; who kindly agrees to evaluate the patient for a colonoscopy as soon as possible.  # follow up in 2 week; will need pre-meds [will call]; Gazyva. Check hepatitis workup.  # I reviewed the blood work- with the patient in detail; also reviewed the imaging independently [as summarized above]; and with the patient in detail.

## 2016-10-25 NOTE — Telephone Encounter (Signed)
Please inform patient to start steroids/dexamethasone; Singulair- as directed; prescriptions sent into the pharmacy- To avoid infusion reactions.   # Also recommend starting- 2 days prior to infusion for 4 days.  1] Tylenol 650 mg once a day 2] Claritin once a day 3] Zantac 150 mg twice a day.  -----------------------------------------------------    Will check with oral chemo pharmacist re: Chlorambucil coverage.

## 2016-10-25 NOTE — Progress Notes (Signed)
[place] OFFICE PROGRESS NOTE  Patient Care Team: [name], MD as PCP - General (Unknown Physician Specialty) Bary Castilla, Forest Gleason, MD (General Surgery) Forest Gleason, MD (Oncology) Corey Skains, MD as Consulting Physician (Cardiology) Anell Barr, OD as Consulting Physician (Optometry)  Cancer Staging Lymphoma [facility]) Staging form: Lymphoid Neoplasms, AJCC 6th Edition - Clinical: No stage assigned - Unsigned    Oncology History   1. Lymphoma low-grade, status Rituxan therapy. 2. Recurrent disease by clinical examination in December of 2012 3. Ovarian mass on PET scan in December of 2012 (normal CA 125). Ovarian mass has resolved after rituximab therapy. 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  Patient had total 5 cycles of chemotherapy with bendamustine and rituximab and 6 cycle was omitted because of significant side effects with and weakness and fatigue.  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)         INTERVAL HISTORY:  Jacqueline Webb 76 y.o.  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  Patient notes to have increasing lump in the right axilla. Also notes to have lumps in the neck. She also notes feeling "knots"- left groin especially when standing. Otherwise denies any night  sweats fevers or chills. Denies any extreme fatigue or weight loss. Denies any blood in stools black red stools or constipation.   REVIEW OF SYSTEMS:  A complete 10 point review of system is done which is negative except mentioned above/history of present illness.   PAST MEDICAL HISTORY :  Past Medical History:  Diagnosis Date  . A-fib ([place])   . Benign neoplasm of rectum and anal canal   . 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 Hypercholesteremia   . Hypertension   . Hyperthyroidism   . Lymphoma ([place]) 2007  . Non Hodgkin's lymphoma ([place])   . Non Hodgkin's lymphoma ([place])   . Non Hodgkin's lymphoma ([place]) 2008  . Non Hodgkin's lymphoma ([place]) 01/22/2006  . Personal history of chemotherapy     PAST SURGICAL HISTORY :   Past Surgical History:  Procedure Laterality Date  . ABDOMINAL HYSTERECTOMY  1958  . APPENDECTOMY    . BREAST BIOPSY Right 1970   right breast biopsy with clip- neg  . BREAST BIOPSY Left 2002   left breast biopsy with clip-neg  . BREAST BIOPSY Right 2014   right breast biopsy with clip-neg  . BREAST CYST ASPIRATION Bilateral 2000   bilateral fine needle aspiration  . BREAST SURGERY Left 1990   biopsy  . BREAST SURGERY Right May 08, 2012   complex fibroadenoma without malignancy.  [name] CARDIAC CATHETERIZATION  2014  . COLONOSCOPY  2009, 2012    hyperplastic rectal polyp 2012 tubular adenoma of proximal ascending colon 2000 9 repeat exam due to 2017.  [name]  CORONARY ARTERY BYPASS GRAFT  1999  . FLEXIBLE SIGMOIDOSCOPY  1998    FAMILY HISTORY :   Family History  Problem Relation Age of Onset  . Lung cancer Father        Died age 61  . Heart disease Mother        Died age 56  . Asthma Mother   . CAD Brother        double bypass  . Heart attack Sister   . Tuberculosis Sister        65 years old  . Lung cancer Sister        Died in her 21s  . Angina Sister   . Breast cancer Sister 64  . Rheum arthritis  Sister   . Aneurysm Sister        Brain, died age 14  . Angina Sister   . Heart disease Brother        Stent placement  . Heart disease Brother        stent placement  . Cancer Other        breast cancer niece, pancreatic cancer niece  . Breast cancer Other   . Ovarian cancer Neg Hx   . Diabetes Neg Hx     SOCIAL HISTORY:   Social History  Substance Use Topics  . Smoking status: Never Smoker  . Smokeless tobacco: Never Used  . Alcohol use 1.2 - 1.8 oz/week    1 - 2 Glasses of wine, 1 Shots of liquor per week     Comment: daily-     ALLERGIES:  has No Known Allergies.  MEDICATIONS:  Current Outpatient Prescriptions  Medication Sig Dispense Refill  . levothyroxine (SYNTHROID, LEVOTHROID) 88 MCG tablet TAKE 1 TABLET BY MOUTH  DAILY BEFORE BREAKFAST 90 tablet 3  . lisinopril-hydrochlorothiazide (PRINZIDE,ZESTORETIC) 20-12.5 MG tablet TAKE 1 TABLET BY MOUTH  DAILY 90 tablet 3  . Melatonin 1 MG CAPS Take 1 capsule by mouth at bedtime.     . metoprolol succinate (TOPROL-XL) 25 MG 24 hr tablet Take 25 mg by mouth daily.     [name] oxybutynin (DITROPAN) 5 MG tablet     . XARELTO 20 MG TABS tablet Take 20 mg by mouth daily with supper.     [name] acyclovir (ZOVIRAX) 400 MG tablet Take 1 tablet (400 mg total) by mouth daily. 30 tablet 5  . dexamethasone (DECADRON) 4 MG tablet Start 2 days prior to infusion; Take for 2 days. Do not take on the day of infusion. 60 tablet 3  . montelukast (SINGULAIR) 10 MG tablet Take 1 tablet (10 mg total) by mouth at bedtime. Start 2 days prior to infusion. Take it for 4 days. 60 tablet 0  . sulfamethoxazole-trimethoprim (BACTRIM DS,SEPTRA DS) 800-160 MG tablet Take 1 tablet by mouth 3 (three) times a week. 12 tablet 5   No current facility-administered medications for this visit.     PHYSICAL EXAMINATION: ECOG PERFORMANCE STATUS: 0 - Asymptomatic  BP 134/82 (BP Location: Left Arm, Patient Position: Sitting)   Pulse (!) 106   Temp (!) 97.4 F (36.3 C)  (Tympanic)   Resp 16   Wt 148 lb (67.1 kg)   BMI 24.63 kg/m   Filed Weights   10/25/16 1039  Weight: 148 lb (67.1 kg)    GENERAL: Well-nourished well-developed; Alert, no distress and comfortable.  Accompanied by her husband. EYES: no pallor or icterus OROPHARYNX: no thrush or ulceration; good dentition  NECK: supple, no masses felt LYMPH:  Bil rubbery 1-2 cm palpable lymphadenopathy in the axillary region; 1 cm right level II  LUNGS: clear to auscultation and  No wheeze or crackles HEART/CVS: regular rate & rhythm and no murmurs; No lower extremity edema ABDOMEN:abdomen soft, non-tender and normal bowel sounds Musculoskeletal:no cyanosis of digits and no clubbing  PSYCH: alert & oriented x 3 with fluent speech NEURO: no focal motor/sensory deficits SKIN:  no rashes or significant lesions  LABORATORY DATA:  I have reviewed the data as listed    Component Value Date/Time   NA 135 10/25/2016 1530   NA 144 02/13/2016 1412   NA 141 05/11/2014 0853   K 3.9 10/25/2016 1530   K 3.6 05/11/2014 0853   CL 101 10/25/2016 1530   CL 103 05/11/2014 0853   CO2 27 10/25/2016 1530   CO2 29 05/11/2014 0853   GLUCOSE 115 (H) 10/25/2016 1530   GLUCOSE 107 (H) 05/11/2014 0853   BUN 22 (H) 10/25/2016 1530   BUN 24 02/13/2016 1412   BUN 22 (H) 05/11/2014 0853   CREATININE 1.24 (H) 10/25/2016 1530   CREATININE 1.07 (H) 05/11/2014 0853   CALCIUM 9.8 10/25/2016 1530   CALCIUM 10.1 05/11/2014 0853   PROT 7.1 10/16/2016 1130   PROT 6.6 02/13/2016 1412   PROT 7.3 05/11/2014 0853   ALBUMIN 4.2 10/16/2016 1130   ALBUMIN 4.2 02/13/2016 1412   ALBUMIN 4.7 05/11/2014 0853   AST 26 10/16/2016 1130   AST 29 05/11/2014 0853   ALT 23 10/16/2016 1130   ALT 20 05/11/2014 0853   ALKPHOS 68 10/16/2016 1130   ALKPHOS 47 05/11/2014 0853   BILITOT 0.8 10/16/2016 1130   BILITOT 0.6 02/13/2016 1412   BILITOT 1.0 05/11/2014 0853   GFRNONAA 41 (L) 10/25/2016 1530   GFRNONAA 51 (L) 05/11/2014 0853    GFRAA 47 (L) 10/25/2016 1530   GFRAA 59 (L) 05/11/2014 0853    No results found for: SPEP, UPEP  Lab Results  Component Value Date   WBC 4.7 10/25/2016   NEUTROABS 2.9 10/25/2016   HGB 13.5 10/25/2016   HCT 39.8 10/25/2016   MCV 87.2 10/25/2016   PLT 87 (L) 10/25/2016      Chemistry      Component Value Date/Time   NA 135 10/25/2016 1530   NA 144 02/13/2016 1412   NA 141 05/11/2014 0853   K 3.9 10/25/2016 1530   K 3.6 05/11/2014 0853   CL 101 10/25/2016 1530   CL 103 05/11/2014 0853   CO2 27 10/25/2016 1530   CO2 29 05/11/2014 0853   BUN 22 (H) 10/25/2016 1530   BUN 24 02/13/2016 1412   BUN 22 (H) 05/11/2014 0853   CREATININE 1.24 (H) 10/25/2016 1530   CREATININE 1.07 (H) 05/11/2014 0853   GLU 98 05/25/2013      Component Value Date/Time   CALCIUM 9.8 10/25/2016 1530   CALCIUM 10.1 05/11/2014 0853   ALKPHOS 68 10/16/2016 1130   ALKPHOS 47 05/11/2014 0853   AST 26 10/16/2016 1130   AST 29 05/11/2014 0853   ALT 23 10/16/2016 1130   ALT 20 05/11/2014 0853   BILITOT 0.8 10/16/2016 1130   BILITOT 0.6 02/13/2016 1412   BILITOT 1.0 05/11/2014 0853     : 1. No evidence of persistent small bowel intussusception. No definite small bowel neoplasm identified on enterography images. 2. Worsening lymphadenopathy throughout the abdomen and pelvis, concerning for recurrent lymphoma. 3. Circumferential thickening of the distal rectum with some borderline enlarged mesorectal lymph nodes.  These findings are nonspecific, but correlation with sigmoidoscopy or colonoscopy should be considered if there is any clinical concern for underlying colorectal neoplasm. 4. Left adnexal mass measuring 3.3 x 4.2 x 3.8 cm, similar to prior examinations. 5. Cardiomegaly with biatrial dilatation. 6. Aortic atherosclerosis, in addition to least 2 vessel coronary artery disease. Assessment for potential risk factor modification, dietary therapy or pharmacologic therapy may be warranted,  if clinically indicated. 7. Additional incidental findings, as above.  Aortic Atherosclerosis (ICD10-I70.0).   Electronically Signed   By: Vinnie Langton M.D.   On: 10/22/2016 12:21   RADIOGRAPHIC STUDIES: I have personally reviewed the radiological images as listed and agreed with the findings in the report. No results found.   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 CT SEP 27th 2018-worsening axillary; mediastinal; retroperitoneal pelvic lymphadenopathy. Platelet count 90s.  # Proceed with Gazyva infusion. Discussed the potential infusion reactions. Recommend premedications. Discussed treatments are palliative/to control the disease; and not curative.  # left ? Adnexal mass- ? Etiology; mild SUV uptake incidental [stable compared to previous PET in 2016] Previous workup 2015- vaginal ultrasound negative. MRI February 2018- approximately 3 cm in size; slow increase in the size of the left adnexal mass over at least 3 years- suspect involvement of lymphoma rather than a primary gynecologic malignancy.  # Poor IV access- discussed regarding the port; patient interested however. No decisions were made.   # Thrombocytopenia- 90s asymptomatic; on Xarelto- likely secondary to underlying SLL. This should improve after starting treatment.  # Rectal thickening- on the CT scan incidentally. Patient is asymptomatic. Last colonoscopy more than 10 years ago. Discussed with Dr. Charissa Bash; who kindly agrees to evaluate the patient for a colonoscopy as soon as possible.  # follow up in 2 week; will need pre-meds [will call]; Gazyva. Check hepatitis workup.  # I reviewed the blood work- with the patient in detail; also reviewed the imaging independently [as summarized above]; and with the patient in detail.    Orders Placed This Encounter   Procedures  . Hepatitis B core antibody, IgM    Standing Status:   Future    Number of Occurrences:   1    Standing Expiration Date:   10/25/2017  . Hepatitis B surface antigen    Standing Status:   Future    Number of Occurrences:   1    Standing Expiration Date:   10/25/2017  . Hepatitis C antibody    Standing Status:   Future    Number of Occurrences:   1    Standing Expiration Date:   10/25/2017  . CBC with Differential/Platelet    Standing Status:   Future    Number of Occurrences:   1    Standing Expiration Date:   10/25/2017  . Basic metabolic panel    Standing Status:   Future    Number of Occurrences:   1    Standing Expiration Date:   10/25/2017   All questions were answered. The patient knows to call the clinic with any problems, questions or concerns.      Cammie Sickle, MD 10/26/2016 5:19 PM

## 2016-10-25 NOTE — Progress Notes (Signed)
START ON PATHWAY REGIMEN - Lymphoma and CLL     A cycle is every 28 days:     Obinutuzumab      Obinutuzumab      Obinutuzumab      Obinutuzumab      Chlorambucil   **Always confirm dose/schedule in your pharmacy ordering system**    Patient Characteristics: Small Lymphocytic Lymphoma (SLL), First Line, Treatment Indicated, 17p del (-) or Unknown, Frail Patient Disease Type: Small Lymphocytic Lymphoma (SLL) Disease Type: Not Applicable Disease Type: Not Applicable [place] Stage: IV Line of therapy: First Line Treatment Indicated<= Treatment Indicated 17p Deletion Status: Unknown Fit or Frail Patient<= Frail Patient Intent of Therapy: Non-Curative / Palliative Intent, Discussed with Patient 

## 2016-10-26 ENCOUNTER — Telehealth: Payer: Self-pay | Admitting: Pharmacist

## 2016-10-26 DIAGNOSIS — C8308 Small cell B-cell lymphoma, lymph nodes of multiple sites: Secondary | ICD-10-CM

## 2016-10-26 LAB — HEPATITIS B CORE ANTIBODY, IGM: Hep B C IgM: NEGATIVE

## 2016-10-26 LAB — HEPATITIS C ANTIBODY

## 2016-10-26 LAB — HEPATITIS B SURFACE ANTIGEN: Hepatitis B Surface Ag: NEGATIVE

## 2016-10-26 MED ORDER — CHLORAMBUCIL 2 MG PO TABS: 0.5000 mg/kg | ORAL_TABLET | ORAL | 5 refills | Status: DC

## 2016-10-26 NOTE — Telephone Encounter (Signed)
Oral Oncology Patient Advocate Encounter   Patients co-pay for Leukeran is $381.38 at this point there is no assistance. Spoke with Dr. Rogue Bussing and he said with not proceed with Leukeran due to cost.   [place] [facility] Patient Advocate [phone] 10/26/2016 12:04 PM

## 2016-10-26 NOTE — Addendum Note (Signed)
Addended by: Darl Pikes on: 10/26/2016 12:19 PM   Modules accepted: Orders

## 2016-10-26 NOTE — Telephone Encounter (Signed)
Oral Oncology Pharmacist Encounter  Received new prescription for chlorambucil (Leukeran) for the treatment of SLL in conjunction with Gazyva, planned duration until disease progression or unacceptable drug toxicity.  CBC from 10/25/16 assessed, no relevant lab abnormalities. Currently platelets are low but this may be due to her disease. Prescription dose and frequency assessed. Chlorambucil 0.5mg /kg D1, D14  Current medication list in Epic reviewed, no DDIs with chlorambucil identified.  Prescription has been e-scribed to the [employer] [name] [employer] for benefits analysis and approval.  Oral Oncology Clinic will continue to follow for insurance authorization, copayment issues, initial counseling and start date.  Darl Pikes, PharmD, BCPS Hematology/Oncology Clinical Pharmacist [facility] [phone]  10/26/2016 9:12 AM

## 2016-10-26 NOTE — Telephone Encounter (Signed)
Per pharmacy- pt's insurance will not cover the Chlorambucil. No current available programs for pt financial assistance.

## 2016-10-26 NOTE — Telephone Encounter (Signed)
Spoke with patient- premedication dosing/instructions discussed with patient.  teach back process performed with pt

## 2016-10-26 NOTE — Telephone Encounter (Signed)
Call attempt to patient - spoke with patient's husband and asked pt's husband to provide msg for pt to call cctr. For medication instructions.

## 2016-10-30 ENCOUNTER — Telehealth: Payer: Self-pay | Admitting: *Deleted

## 2016-10-30 ENCOUNTER — Other Ambulatory Visit: Payer: Self-pay | Admitting: General Surgery

## 2016-10-30 ENCOUNTER — Other Ambulatory Visit: Payer: Self-pay

## 2016-10-30 ENCOUNTER — Telehealth: Payer: Self-pay

## 2016-10-30 DIAGNOSIS — R935 Abnormal findings on diagnostic imaging of other abdominal regions, including retroperitoneum: Secondary | ICD-10-CM

## 2016-10-30 MED ORDER — POLYETHYLENE GLYCOL 3350 17 GM/SCOOP PO POWD: 1.0000 | Freq: Once | ORAL | 0 refills | Status: AC

## 2016-10-30 MED ORDER — DEXAMETHASONE 4 MG PO TABS: ORAL_TABLET | ORAL | 3 refills | Status: DC

## 2016-10-30 NOTE — Telephone Encounter (Signed)
Message left for the patient to call back to schedule her colonoscopy with a pre op appointment here prior.

## 2016-10-30 NOTE — Progress Notes (Signed)
The patient has developed a recurrence of her lymphoma. Recent CT scan showed thickening of the rectum as well as adenopathy in the pelvis. Prior to initiation of treatment colonoscopy is been requested by her treating oncologist.  CT scan of 08/22/2016 was reviewed.  12/28/2016 colonoscopy showed a hyperplastic polyp in the rectum.

## 2016-10-30 NOTE — Telephone Encounter (Signed)
New prescription sent to clarify instructions and pc to pharmacy to cnl yesterdays prescription and use todays prescription

## 2016-10-30 NOTE — Telephone Encounter (Signed)
Spoke with the patient about having her colonoscopy on 11/06/16. She is good with this. Let her know to stop her Xarelto 3 days prior. Reviewed instructions. She will be seen here for a pre op by Dr Bary Castilla on 11/05/16 at 2:15 pm. She will contact the cancer center and let them know when she is having her colonoscopy as they may need to adjust her medications prior to her starting her therapy on 11/08/16. Miralax prescription has been sent into her pharmacy. Will review colonoscopy instructions when she is seen here on 11/05/16.

## 2016-11-02 ENCOUNTER — Telehealth: Payer: Self-pay | Admitting: *Deleted

## 2016-11-02 DIAGNOSIS — C8308 Small cell B-cell lymphoma, lymph nodes of multiple sites: Secondary | ICD-10-CM

## 2016-11-02 NOTE — Telephone Encounter (Signed)
Patient called. Stated that she was upset that Dr. Did not explain why she needed to be on the acyclovir and bactrim. She stated that she didn't understand why these 2 prescriptions went to [facility] in [place] and the others went to [facility].  I explained that she had multiple pharmacies listed on her chart. She prefers all rx to go to [facility] in [place] from this point forward. I apologized for the inconvenience and reassured patient that her future scripts would be sent to her preferred pharmacy per her request. She inquired about the RFs on the decadron and singular. I explained that any future RFs could be Resubmitted to her preferred pharmacy when the patient requires a RF. I explained to the patient the about the prophylactic use of the acyclovir and bactrim. She gave verbal understanding.

## 2016-11-05 ENCOUNTER — Encounter: Payer: Self-pay | Admitting: *Deleted

## 2016-11-05 ENCOUNTER — Encounter: Payer: Self-pay | Admitting: General Surgery

## 2016-11-05 ENCOUNTER — Ambulatory Visit (INDEPENDENT_AMBULATORY_CARE_PROVIDER_SITE_OTHER): Payer: Medicare Other | Admitting: General Surgery

## 2016-11-05 VITALS — BP 124/70 | HR 68 | Resp 14 | Ht 67.0 in | Wt 146.0 lb

## 2016-11-05 DIAGNOSIS — R935 Abnormal findings on diagnostic imaging of other abdominal regions, including retroperitoneum: Secondary | ICD-10-CM

## 2016-11-05 DIAGNOSIS — Z86010 Personal history of colonic polyps: Secondary | ICD-10-CM | POA: Diagnosis not present

## 2016-11-05 DIAGNOSIS — C8308 Small cell B-cell lymphoma, lymph nodes of multiple sites: Secondary | ICD-10-CM | POA: Diagnosis not present

## 2016-11-05 NOTE — Patient Instructions (Signed)
Colonoscopy, Adult A colonoscopy is an exam to look at the entire large intestine. During the exam, a lubricated, bendable tube is inserted into the anus and then passed into the rectum, colon, and other parts of the large intestine. A colonoscopy is often done as a part of normal colorectal screening or in response to certain symptoms, such as anemia, persistent diarrhea, abdominal pain, and blood in the stool. The exam can help screen for and diagnose medical problems, including:  Tumors.  Polyps.  Inflammation.  Areas of bleeding.  Tell a health care provider about:  Any allergies you have.  All medicines you are taking, including vitamins, herbs, eye drops, creams, and over-the-counter medicines.  Any problems you or family members have had with anesthetic medicines.  Any blood disorders you have.  Any surgeries you have had.  Any medical conditions you have.  Any problems you have had passing stool. What are the risks? Generally, this is a safe procedure. However, problems may occur, including:  Bleeding.  A tear in the intestine.  A reaction to medicines given during the exam.  Infection (rare).  What happens before the procedure? Eating and drinking restrictions Follow instructions from your health care provider about eating and drinking, which may include:  A few days before the procedure - follow a low-fiber diet. Avoid nuts, seeds, dried fruit, raw fruits, and vegetables.  1-3 days before the procedure - follow a clear liquid diet. Drink only clear liquids, such as clear broth or bouillon, black coffee or tea, clear juice, clear soft drinks or sports drinks, gelatin dessert, and popsicles. Avoid any liquids that contain red or purple dye.  On the day of the procedure - do not eat or drink anything during the 2 hours before the procedure, or within the time period that your health care provider recommends.  Bowel prep If you were prescribed an oral bowel prep  to clean out your colon:  Take it as told by your health care provider. Starting the day before your procedure, you will need to drink a large amount of medicated liquid. The liquid will cause you to have multiple loose stools until your stool is almost clear or light green.  If your skin or anus gets irritated from diarrhea, you may use these to relieve the irritation: ? Medicated wipes, such as adult wet wipes with aloe and vitamin E. ? A skin soothing-product like petroleum jelly.  If you vomit while drinking the bowel prep, take a break for up to 60 minutes and then begin the bowel prep again. If vomiting continues and you cannot take the bowel prep without vomiting, call your health care provider.  General instructions  Ask your health care provider about changing or stopping your regular medicines. This is especially important if you are taking diabetes medicines or blood thinners.  Plan to have someone take you home from the hospital or clinic. What happens during the procedure?  An IV tube may be inserted into one of your veins.  You will be given medicine to help you relax (sedative).  To reduce your risk of infection: ? Your health care team will wash or sanitize their hands. ? Your anal area will be washed with soap.  You will be asked to lie on your side with your knees bent.  Your health care provider will lubricate a long, thin, flexible tube. The tube will have a camera and a light on the end.  The tube will be inserted into your   anus.  The tube will be gently eased through your rectum and colon.  Air will be delivered into your colon to keep it open. You may feel some pressure or cramping.  The camera will be used to take images during the procedure.  A small tissue sample may be removed from your body to be examined under a microscope (biopsy). If any potential problems are found, the tissue will be sent to a lab for testing.  If small polyps are found, your  health care provider may remove them and have them checked for cancer cells.  The tube that was inserted into your anus will be slowly removed. The procedure may vary among health care providers and hospitals. What happens after the procedure?  Your blood pressure, heart rate, breathing rate, and blood oxygen level will be monitored until the medicines you were given have worn off.  Do not drive for 24 hours after the exam.  You may have a small amount of blood in your stool.  You may pass gas and have mild abdominal cramping or bloating due to the air that was used to inflate your colon during the exam.  It is up to you to get the results of your procedure. Ask your health care provider, or the department performing the procedure, when your results will be ready. This information is not intended to replace advice given to you by your health care provider. Make sure you discuss any questions you have with your health care provider. Document Released: 01/06/2000 Document Revised: 11/09/2015 Document Reviewed: 03/22/2015 Elsevier Interactive Patient Education  2018 [employer]  

## 2016-11-05 NOTE — Progress Notes (Signed)
Patient ID: Jacqueline Webb, female   DOB: 1940-02-27, 76 y.o.   MRN: [id number]  Chief Complaint  Patient presents with  . Pre-op Exam    HPI Jacqueline Webb is a 76 y.o. female here today for her pre op colonoscopy. Patient states no GI problems at this time. Last colonoscopy 12/29/2010. Patient is scheduled for colonoscopy 11/06/2016.  HPI  Past Medical History:  Diagnosis Date  . A-fib ([place])   . Benign neoplasm of rectum and anal canal   . 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 Hypercholesteremia   . Hypertension   . Hyperthyroidism   . Lymphoma ([place]) 2007  . Non Hodgkin's lymphoma ([facility]) 01/22/2006  . Personal history of chemotherapy     Past Surgical History:  Procedure Laterality Date  . ABDOMINAL HYSTERECTOMY  1958  . APPENDECTOMY    . BREAST BIOPSY Right 1970   right breast biopsy with clip- neg  . BREAST BIOPSY Left 2002   left breast biopsy with clip-neg  . BREAST BIOPSY Right 2014   right breast biopsy with clip-neg  . BREAST CYST ASPIRATION Bilateral 2000   bilateral fine needle aspiration  . BREAST SURGERY Left 1990   biopsy  . BREAST SURGERY Right May 08, 2012   complex fibroadenoma without malignancy.  [name] CARDIAC CATHETERIZATION  2014  . COLONOSCOPY  2009, 2012    hyperplastic rectal polyp 2012 tubular adenoma of proximal ascending colon 2000 9 repeat exam due to 2017.  [name] CORONARY ARTERY BYPASS GRAFT  1999  . FLEXIBLE SIGMOIDOSCOPY  1998    Family History  Problem Relation Age of Onset  . Lung cancer Father        Died age 32  . Heart disease Mother        Died age 9  . Asthma Mother   . CAD Brother        double bypass  . Heart attack Sister   . Tuberculosis Sister        67 years old  . Lung cancer Sister        Died in her 64s  . Angina Sister   . Breast cancer Sister 53  . Rheum arthritis Sister   . Aneurysm Sister        Brain, died age 39  . Angina Sister   . Heart disease  Brother        Stent placement  . Heart disease Brother        stent placement  . Cancer Other        breast cancer niece, pancreatic cancer niece  . Breast cancer Other   . Ovarian cancer Neg Hx   . Diabetes Neg Hx     Social History Social History  Substance Use Topics  . Smoking status: Never Smoker  . Smokeless tobacco: Never Used  . Alcohol use 1.2 - 1.8 oz/week    1 - 2 Glasses of wine, 1 Shots of liquor per week     Comment: daily-     No Known Allergies  Current Outpatient Prescriptions  Medication Sig Dispense Refill  . acyclovir (ZOVIRAX) 400 MG tablet Take 1 tablet (400 mg total) by mouth daily. 30 tablet 5  . dexamethasone (DECADRON) 4 MG tablet Take  1 tablet daily Start 2 days prior to infusion; Take for 2 days each cycle Do not take on the day of infusion. 60 tablet 3  . levothyroxine (SYNTHROID, LEVOTHROID)  88 MCG tablet TAKE 1 TABLET BY MOUTH  DAILY BEFORE BREAKFAST 90 tablet 3  . lisinopril-hydrochlorothiazide (PRINZIDE,ZESTORETIC) 20-12.5 MG tablet TAKE 1 TABLET BY MOUTH  DAILY 90 tablet 3  . Melatonin 1 MG CAPS Take 1 capsule by mouth at bedtime.     . metoprolol succinate (TOPROL-XL) 25 MG 24 hr tablet Take 25 mg by mouth daily.     . montelukast (SINGULAIR) 10 MG tablet Take 1 tablet (10 mg total) by mouth at bedtime. Start 2 days prior to infusion. Take it for 4 days. 60 tablet 0  . oxybutynin (DITROPAN) 5 MG tablet     . sulfamethoxazole-trimethoprim (BACTRIM DS,SEPTRA DS) 800-160 MG tablet Take 1 tablet by mouth 3 (three) times a week. 12 tablet 5  . XARELTO 20 MG TABS tablet Take 20 mg by mouth daily with supper.      No current facility-administered medications for this visit.     Review of Systems Review of Systems  Constitutional: Negative.   Respiratory: Negative.   Cardiovascular: Negative.     Blood pressure 124/70, pulse 68, resp. rate 14, height 5\' 7"  (1.702 m), weight 146 lb (66.2 kg).  Physical Exam Physical Exam  Constitutional:  She is oriented to person, place, and time. She appears well-developed and well-nourished.  Eyes: Conjunctivae are normal. No scleral icterus.  Neck: Neck supple.  Cardiovascular: Normal rate, regular rhythm and normal heart sounds.   Pulmonary/Chest: Effort normal and breath sounds normal.  Abdominal: Soft. Bowel sounds are normal. There is no tenderness.  Lymphadenopathy:    She has no cervical adenopathy.    She has axillary adenopathy.       Right: Inguinal adenopathy present.       Left: Inguinal adenopathy present.  Neurological: She is alert and oriented to person, place, and time.  Skin: Skin is warm and dry.    Data Reviewed CT of the abdomen and pelvis dated 10/22/2016 was reviewed areaIMPRESSION: 1. No evidence of persistent small bowel intussusception. No definite small bowel neoplasm identified on enterography images. 2. Worsening lymphadenopathy throughout the abdomen and pelvis, concerning for recurrent lymphoma. 3. Circumferential thickening of the distal rectum with some borderline enlarged mesorectal lymph nodes. These findings are nonspecific, but correlation with sigmoidoscopy or colonoscopy should be considered if there is any clinical concern for underlying colorectal neoplasm. 4. Left adnexal mass measuring 3.3 x 4.2 x 3.8 cm, similar to prior examinations. 5. Cardiomegaly with biatrial dilatation. 6. Aortic atherosclerosis, in addition to least 2 vessel coronary artery disease. Assessment for potential risk factor modification, dietary therapy or pharmacologic therapy may be warranted, if clinically indicated. 7. Additional incidental findings, as above.  Colonoscopy dated 12/29/2010 showed a hyperplastic polyp in the rectum.  Assessment    Recurrent lymphoma.  Abnormal imaging of the distal rectum.     Plan    Colonoscopy was requested by the treating oncologist. This is scheduled for tomorrow.     Colonoscopy with possible biopsy/polypectomy  prn: Information regarding the procedure, including its potential risks and complications (including but not limited to perforation of the bowel, which may require emergency surgery to repair, and bleeding) was verbally given to the patient. Educational information regarding lower intestinal endoscopy was given to the patient. Written instructions for how to complete the bowel prep using Miralax were provided. The importance of drinking ample fluids to avoid dehydration as a result of the prep emphasized.  HPI, Physical Exam, Assessment and Plan have been scribed under the direction and in  the presence of Hervey Ard, MD.  Gaspar Cola, CMA  I have completed the exam and reviewed the above documentation for accuracy and completeness.  I agree with the above.  Haematologist has been used and any errors in dictation or transcription are unintentional.  Hervey Ard, M.D., F.A.C.S.   Robert Bellow 11/05/2016, 9:39 PM  Patient has been scheduled for a colonoscopy on 11-06-16 at [facility]. Colonoscopy instructions have been reviewed with the patient. This patient is aware to call the office if they have further questions. She is aware to arrive at [facility] tomorrow at 12:30 pm to check-in.   Dominga Ferry, CMA

## 2016-11-06 ENCOUNTER — Ambulatory Visit: Payer: Medicare Other | Admitting: Anesthesiology

## 2016-11-06 ENCOUNTER — Encounter
Admission: RE | Disposition: A | Discharge status: Home / Self Care | Payer: Self-pay | Source: Ambulatory Visit | Attending: General Surgery

## 2016-11-06 ENCOUNTER — Encounter: Payer: Self-pay | Admitting: *Deleted

## 2016-11-06 ENCOUNTER — Ambulatory Visit
Admission: RE | Admit: 2016-11-06 | Discharge: 2016-11-06 | Disposition: A | Discharge status: Home / Self Care | Payer: Medicare Other | Source: Ambulatory Visit | Attending: General Surgery | Admitting: General Surgery

## 2016-11-06 DIAGNOSIS — D123 Benign neoplasm of transverse colon: Secondary | ICD-10-CM | POA: Insufficient documentation

## 2016-11-06 DIAGNOSIS — D696 Thrombocytopenia, unspecified: Secondary | ICD-10-CM | POA: Diagnosis not present

## 2016-11-06 DIAGNOSIS — D126 Benign neoplasm of colon, unspecified: Secondary | ICD-10-CM | POA: Diagnosis not present

## 2016-11-06 DIAGNOSIS — E039 Hypothyroidism, unspecified: Secondary | ICD-10-CM | POA: Insufficient documentation

## 2016-11-06 DIAGNOSIS — Z79899 Other long term (current) drug therapy: Secondary | ICD-10-CM | POA: Insufficient documentation

## 2016-11-06 DIAGNOSIS — Q438 Other specified congenital malformations of intestine: Secondary | ICD-10-CM | POA: Insufficient documentation

## 2016-11-06 DIAGNOSIS — D122 Benign neoplasm of ascending colon: Secondary | ICD-10-CM | POA: Diagnosis not present

## 2016-11-06 DIAGNOSIS — K635 Polyp of colon: Secondary | ICD-10-CM | POA: Diagnosis not present

## 2016-11-06 DIAGNOSIS — I2510 Atherosclerotic heart disease of native coronary artery without angina pectoris: Secondary | ICD-10-CM | POA: Diagnosis not present

## 2016-11-06 DIAGNOSIS — I10 Essential (primary) hypertension: Secondary | ICD-10-CM | POA: Insufficient documentation

## 2016-11-06 DIAGNOSIS — R933 Abnormal findings on diagnostic imaging of other parts of digestive tract: Secondary | ICD-10-CM | POA: Diagnosis not present

## 2016-11-06 DIAGNOSIS — R935 Abnormal findings on diagnostic imaging of other abdominal regions, including retroperitoneum: Secondary | ICD-10-CM

## 2016-11-06 HISTORY — PX: COLONOSCOPY WITH PROPOFOL: SHX5780

## 2016-11-06 SURGERY — COLONOSCOPY WITH PROPOFOL
Anesthesia: General

## 2016-11-06 MED ORDER — MIDAZOLAM HCL 2 MG/2ML IJ SOLN
INTRAMUSCULAR | Status: DC | PRN
  Administered 2016-11-06: 2 mg via INTRAVENOUS

## 2016-11-06 MED ORDER — MIDAZOLAM HCL 2 MG/2ML IJ SOLN
INTRAMUSCULAR | Status: AC
  Filled 2016-11-06: qty 2

## 2016-11-06 MED ORDER — EPHEDRINE SULFATE 50 MG/ML IJ SOLN
INTRAMUSCULAR | Status: DC | PRN
  Administered 2016-11-06: 5 mg via INTRAVENOUS

## 2016-11-06 MED ORDER — PROPOFOL 500 MG/50ML IV EMUL
INTRAVENOUS | Status: AC
Start: 2016-11-06 — End: ?
  Filled 2016-11-06: qty 50

## 2016-11-06 MED ORDER — PHENYLEPHRINE HCL 10 MG/ML IJ SOLN
INTRAMUSCULAR | Status: DC | PRN
  Administered 2016-11-06: 100 ug via INTRAVENOUS
  Administered 2016-11-06 (×3): 50 ug via INTRAVENOUS

## 2016-11-06 MED ORDER — PROPOFOL 500 MG/50ML IV EMUL
INTRAVENOUS | Status: DC | PRN
  Administered 2016-11-06: 100 ug/kg/min via INTRAVENOUS

## 2016-11-06 MED ORDER — FENTANYL CITRATE (PF) 100 MCG/2ML IJ SOLN
INTRAMUSCULAR | Status: DC | PRN
  Administered 2016-11-06: 50 ug via INTRAVENOUS

## 2016-11-06 MED ORDER — FENTANYL CITRATE (PF) 100 MCG/2ML IJ SOLN
INTRAMUSCULAR | Status: AC
  Filled 2016-11-06: qty 2

## 2016-11-06 MED ORDER — SODIUM CHLORIDE 0.9 % IV SOLN
INTRAVENOUS | Status: DC
  Administered 2016-11-06: 1000 mL via INTRAVENOUS

## 2016-11-06 NOTE — Anesthesia Preprocedure Evaluation (Signed)
Anesthesia Evaluation  Patient identified by MRN, date of birth, ID band Patient awake    Reviewed: Allergy & Precautions, NPO status , Patient's Chart, lab work & pertinent test results  Airway Mallampati: II       Dental  (+) Teeth Intact   Pulmonary neg pulmonary ROS,           Cardiovascular Exercise Tolerance: Good hypertension, Pt. on medications and Pt. on home beta blockers + CAD   Rhythm:Regular Rate:Normal     Neuro/Psych negative psych ROS   GI/Hepatic negative GI ROS, Neg liver ROS,   Endo/Other  Hypothyroidism   Renal/GU negative Renal ROS     Musculoskeletal negative musculoskeletal ROS (+)   Abdominal Normal abdominal exam  (+)   Peds negative pediatric ROS (+)  Hematology thrombocytopenia   Anesthesia Other Findings   Reproductive/Obstetrics                             Anesthesia Physical Anesthesia Plan  ASA: II  Anesthesia Plan: General   Post-op Pain Management:    Induction: Intravenous  PONV Risk Score and Plan:   Airway Management Planned: Natural Airway and Nasal Cannula  Additional Equipment:   Intra-op Plan:   Post-operative Plan:   Informed Consent: I have reviewed the patients History and Physical, chart, labs and discussed the procedure including the risks, benefits and alternatives for the proposed anesthesia with the patient or authorized representative who has indicated his/her understanding and acceptance.     Plan Discussed with: CRNA  Anesthesia Plan Comments:         Anesthesia Quick Evaluation

## 2016-11-06 NOTE — Anesthesia Post-op Follow-up Note (Signed)
Anesthesia QCDR form completed.        

## 2016-11-06 NOTE — H&P (Signed)
No change from yesterday. For colonoscopy.

## 2016-11-06 NOTE — Anesthesia Postprocedure Evaluation (Signed)
Anesthesia Post Note  Patient: Jacqueline Webb  Procedure(s) Performed: COLONOSCOPY WITH PROPOFOL (N/A )  Patient location during evaluation: Endoscopy Anesthesia Type: General Level of consciousness: awake and alert and oriented Pain management: pain level controlled Vital Signs Assessment: post-procedure vital signs reviewed and stable Respiratory status: spontaneous breathing, nonlabored ventilation and respiratory function stable Cardiovascular status: blood pressure returned to baseline and stable Postop Assessment: no signs of nausea or vomiting Anesthetic complications: no     Last Vitals:  Vitals:   11/06/16 1538 11/06/16 1548  BP: 118/67 124/68  Pulse: 79 86  Resp: 15 16  Temp:    SpO2: 100% 98%    Last Pain:  Vitals:   11/06/16 1516  TempSrc: Tympanic  PainSc:                  Lizabeth Fellner

## 2016-11-06 NOTE — Anesthesia Procedure Notes (Signed)
Performed by: COOK-MARTIN, Saleema Weppler Pre-anesthesia Checklist: Patient identified, Emergency Drugs available, Suction available, Patient being monitored and Timeout performed Patient Re-evaluated:Patient Re-evaluated prior to induction Oxygen Delivery Method: Nasal cannula Preoxygenation: Pre-oxygenation with 100% oxygen Induction Type: IV induction Placement Confirmation: CO2 detector and positive ETCO2       

## 2016-11-06 NOTE — Transfer of Care (Signed)
Immediate Anesthesia Transfer of Care Note  Patient: Jacqueline Webb  Procedure(s) Performed: COLONOSCOPY WITH PROPOFOL (N/A )  Patient Location: PACU  Anesthesia Type:General  Level of Consciousness: awake, alert  and oriented  Airway & Oxygen Therapy: Patient Spontanous Breathing and Patient connected to nasal cannula oxygen  Post-op Assessment: Report given to RN and Post -op Vital signs reviewed and stable  Post vital signs: Reviewed and stable  Last Vitals:  Vitals:   11/06/16 1516 11/06/16 1518  BP: 101/61 101/61  Pulse: 92 92  Resp:  17  Temp: 37.2 C 37.2 C  SpO2: 97% 98%    Last Pain:  Vitals:   11/06/16 1516  TempSrc: Tympanic  PainSc:       Patients Stated Pain Goal: 0 (36/14/43 1540)  Complications: No apparent anesthesia complications

## 2016-11-06 NOTE — Op Note (Signed)
[facility] Patient Name: Jacqueline Webb Procedure Date: 11/06/2016 1:26 PM MRN: [id number] Account #: [id number] Date of Birth: [birth year] Admit Type: Outpatient Age: 76 Room: [facility] ENDO ROOM 1 Gender: Female Note Status: Finalized Procedure:            Colonoscopy Indications:          Abnormal CT of the GI tract Providers:            Robert Bellow, MD Referring MD:         Janine Ores. [name], MD (Referring MD) Medicines:            Monitored Anesthesia Care Complications:        No immediate complications. Procedure:            Pre-Anesthesia Assessment:                       - Prior to the procedure, a History and Physical was                        performed, and patient medications, allergies and                        sensitivities were reviewed. The patient's tolerance of                        previous anesthesia was reviewed.                       - The risks and benefits of the procedure and the                        sedation options and risks were discussed with the                        patient. All questions were answered and informed                        consent was obtained.                       After obtaining informed consent, the colonoscope was                        passed under direct vision. Throughout the procedure,                        the patient's blood pressure, pulse, and oxygen                        saturations were monitored continuously. The                        Colonoscope was introduced through the anus and                        advanced to the the cecum, identified by appendiceal                        orifice and ileocecal valve. The colonoscopy was  technically difficult and complex due to significant                        looping and a tortuous colon. Successful completion of                        the procedure was aided by changing the patient to a       supine position. The patient tolerated the procedure                        well. The quality of the bowel preparation was                        excellent. Findings:      A 15 mm polyp was found in the mid ascending colon. The polyp was       sessile. Biopsies were taken with a cold forceps for histology.      A 5 mm polyp was found in the transverse colon. The polyp was sessile.       Biopsies were taken with a cold forceps for histology.      The retroflexed view of the distal rectum and anal verge was normal and       showed no anal or rectal abnormalities. Impression:           - One 15 mm polyp in the mid ascending colon. Biopsied.                       - One 5 mm polyp in the transverse colon. Biopsied.                       - The distal rectum and anal verge are normal on                        retroflexion view. Recommendation:       - Telephone endoscopist for pathology results in 1 week. Procedure Code(s):    --- Professional ---                       [phone], Colonoscopy, flexible; with biopsy, single or                        multiple Diagnosis Code(s):    --- Professional ---                       D12.2, Benign neoplasm of ascending colon                       D12.3, Benign neoplasm of transverse colon (hepatic                        flexure or splenic flexure)                       R93.3, Abnormal findings on diagnostic imaging of other                        parts of digestive tract CPT copyright 2016 American Medical Association. All rights reserved. The codes documented in this report are preliminary and upon coder review may  be  revised to meet current compliance requirements. Robert Bellow, MD 11/06/2016 3:16:13 PM This report has been signed electronically. Number of Addenda: 0 Note Initiated On: 11/06/2016 1:26 PM Scope Withdrawal Time: 0 hours 17 minutes 0 seconds  Total Procedure Duration: 0 hours 39 minutes 50 seconds       [facility]

## 2016-11-07 ENCOUNTER — Other Ambulatory Visit: Payer: Self-pay | Admitting: *Deleted

## 2016-11-07 ENCOUNTER — Encounter: Payer: Self-pay | Admitting: General Surgery

## 2016-11-07 DIAGNOSIS — C8308 Small cell B-cell lymphoma, lymph nodes of multiple sites: Secondary | ICD-10-CM

## 2016-11-08 ENCOUNTER — Inpatient Hospital Stay: Payer: Medicare Other

## 2016-11-08 ENCOUNTER — Other Ambulatory Visit: Payer: Self-pay

## 2016-11-08 ENCOUNTER — Inpatient Hospital Stay (HOSPITAL_BASED_OUTPATIENT_CLINIC_OR_DEPARTMENT_OTHER): Payer: Medicare Other | Admitting: Internal Medicine

## 2016-11-08 VITALS — BP 134/87 | HR 73 | Temp 97.0°F | Resp 20 | Ht 65.0 in | Wt 140.0 lb

## 2016-11-08 VITALS — BP 136/92 | HR 76 | Temp 97.3°F | Resp 20

## 2016-11-08 DIAGNOSIS — E0590 Thyrotoxicosis, unspecified without thyrotoxic crisis or storm: Secondary | ICD-10-CM

## 2016-11-08 DIAGNOSIS — D696 Thrombocytopenia, unspecified: Secondary | ICD-10-CM | POA: Diagnosis not present

## 2016-11-08 DIAGNOSIS — E7800 Pure hypercholesterolemia, unspecified: Secondary | ICD-10-CM | POA: Diagnosis not present

## 2016-11-08 DIAGNOSIS — I4891 Unspecified atrial fibrillation: Secondary | ICD-10-CM | POA: Diagnosis not present

## 2016-11-08 DIAGNOSIS — K629 Disease of anus and rectum, unspecified: Secondary | ICD-10-CM

## 2016-11-08 DIAGNOSIS — I517 Cardiomegaly: Secondary | ICD-10-CM

## 2016-11-08 DIAGNOSIS — R590 Localized enlarged lymph nodes: Secondary | ICD-10-CM

## 2016-11-08 DIAGNOSIS — C8308 Small cell B-cell lymphoma, lymph nodes of multiple sites: Secondary | ICD-10-CM | POA: Diagnosis not present

## 2016-11-08 DIAGNOSIS — Z86010 Personal history of colonic polyps: Secondary | ICD-10-CM | POA: Diagnosis not present

## 2016-11-08 DIAGNOSIS — Z9221 Personal history of antineoplastic chemotherapy: Secondary | ICD-10-CM

## 2016-11-08 DIAGNOSIS — Z803 Family history of malignant neoplasm of breast: Secondary | ICD-10-CM

## 2016-11-08 DIAGNOSIS — Z801 Family history of malignant neoplasm of trachea, bronchus and lung: Secondary | ICD-10-CM

## 2016-11-08 DIAGNOSIS — I129 Hypertensive chronic kidney disease with stage 1 through stage 4 chronic kidney disease, or unspecified chronic kidney disease: Secondary | ICD-10-CM

## 2016-11-08 DIAGNOSIS — N189 Chronic kidney disease, unspecified: Secondary | ICD-10-CM | POA: Diagnosis not present

## 2016-11-08 DIAGNOSIS — Z7901 Long term (current) use of anticoagulants: Secondary | ICD-10-CM

## 2016-11-08 DIAGNOSIS — I700 Atherosclerosis of aorta: Secondary | ICD-10-CM | POA: Diagnosis not present

## 2016-11-08 DIAGNOSIS — Z5112 Encounter for antineoplastic immunotherapy: Secondary | ICD-10-CM | POA: Diagnosis not present

## 2016-11-08 DIAGNOSIS — Z7189 Other specified counseling: Secondary | ICD-10-CM | POA: Insufficient documentation

## 2016-11-08 DIAGNOSIS — Z79899 Other long term (current) drug therapy: Secondary | ICD-10-CM

## 2016-11-08 LAB — COMPREHENSIVE METABOLIC PANEL
ALBUMIN: 4.2 g/dL (ref 3.5–5.0)
ALT: 22 U/L (ref 14–54)
AST: 31 U/L (ref 15–41)
Alkaline Phosphatase: 59 U/L (ref 38–126)
Anion gap: 9 (ref 5–15)
BUN: 23 mg/dL — AB (ref 6–20)
CHLORIDE: 98 mmol/L — AB (ref 101–111)
CO2: 27 mmol/L (ref 22–32)
Calcium: 10.3 mg/dL (ref 8.9–10.3)
Creatinine, Ser: 1.2 mg/dL — ABNORMAL HIGH (ref 0.44–1.00)
GFR calc Af Amer: 49 mL/min — ABNORMAL LOW (ref >60)
GFR calc non Af Amer: 42 mL/min — ABNORMAL LOW (ref >60)
GLUCOSE: 108 mg/dL — AB (ref 65–99)
POTASSIUM: 3.8 mmol/L (ref 3.5–5.1)
SODIUM: 134 mmol/L — AB (ref 135–145)
Total Bilirubin: 0.7 mg/dL (ref 0.3–1.2)
Total Protein: 7.2 g/dL (ref 6.5–8.1)

## 2016-11-08 LAB — CBC WITH DIFFERENTIAL/PLATELET
BASOS ABS: 0.1 10*3/uL (ref 0–0.1)
BASOS PCT: 1 %
EOS ABS: 0.1 10*3/uL (ref 0–0.7)
EOS PCT: 1 %
HEMATOCRIT: 41.2 % (ref 35.0–47.0)
Hemoglobin: 14 g/dL (ref 12.0–16.0)
Lymphocytes Relative: 27 %
Lymphs Abs: 2 10*3/uL (ref 1.0–3.6)
MCH: 29.9 pg (ref 26.0–34.0)
MCHC: 34.1 g/dL (ref 32.0–36.0)
MCV: 87.7 fL (ref 80.0–100.0)
MONO ABS: 0.4 10*3/uL (ref 0.2–0.9)
Monocytes Relative: 6 %
NEUTROS ABS: 4.7 10*3/uL (ref 1.4–6.5)
Neutrophils Relative %: 65 %
PLATELETS: 93 10*3/uL — AB (ref 150–440)
RBC: 4.7 MIL/uL (ref 3.80–5.20)
RDW: 15.4 % — AB (ref 11.5–14.5)
WBC: 7.3 10*3/uL (ref 3.6–11.0)

## 2016-11-08 LAB — LACTATE DEHYDROGENASE: LDH: 149 U/L (ref 98–192)

## 2016-11-08 LAB — SURGICAL PATHOLOGY

## 2016-11-08 MED ORDER — SODIUM CHLORIDE 0.9 % IV SOLN
Freq: Once | INTRAVENOUS | Status: AC | PRN
  Administered 2016-11-08: 13:00:00 via INTRAVENOUS
  Filled 2016-11-08: qty 1000

## 2016-11-08 MED ORDER — SODIUM CHLORIDE 0.9 % IV SOLN
20.0000 mg | Freq: Once | INTRAVENOUS | Status: AC
  Administered 2016-11-08: 20 mg via INTRAVENOUS
  Filled 2016-11-08: qty 2

## 2016-11-08 MED ORDER — METHYLPREDNISOLONE SODIUM SUCC 125 MG IJ SOLR
50.0000 mg | Freq: Once | INTRAMUSCULAR | Status: AC
  Administered 2016-11-08: 50 mg via INTRAVENOUS

## 2016-11-08 MED ORDER — FAMOTIDINE IN NACL 20-0.9 MG/50ML-% IV SOLN
20.0000 mg | Freq: Once | INTRAVENOUS | Status: AC | PRN
  Administered 2016-11-08: 20 mg via INTRAVENOUS

## 2016-11-08 MED ORDER — ACETAMINOPHEN 325 MG PO TABS
650.0000 mg | ORAL_TABLET | Freq: Once | ORAL | Status: AC
  Administered 2016-11-08: 650 mg via ORAL
  Filled 2016-11-08: qty 2

## 2016-11-08 MED ORDER — SODIUM CHLORIDE 0.9 % IV SOLN
100.0000 mg | Freq: Once | INTRAVENOUS | Status: AC
  Administered 2016-11-08: 100 mg via INTRAVENOUS
  Filled 2016-11-08: qty 4

## 2016-11-08 MED ORDER — SODIUM CHLORIDE 0.9 % IV SOLN
Freq: Once | INTRAVENOUS | Status: AC
  Administered 2016-11-08: 10:00:00 via INTRAVENOUS
  Filled 2016-11-08: qty 1000

## 2016-11-08 MED ORDER — DIPHENHYDRAMINE HCL 50 MG/ML IJ SOLN
50.0000 mg | Freq: Once | INTRAMUSCULAR | Status: AC
  Administered 2016-11-08: 50 mg via INTRAVENOUS
  Filled 2016-11-08: qty 1

## 2016-11-08 MED ORDER — DIPHENHYDRAMINE HCL 50 MG/ML IJ SOLN
25.0000 mg | Freq: Once | INTRAMUSCULAR | Status: AC
  Administered 2016-11-08: 25 mg via INTRAVENOUS

## 2016-11-08 NOTE — Progress Notes (Signed)
Patient here for follow-up for lymphoma.

## 2016-11-08 NOTE — Progress Notes (Signed)
12:35 - patient complains of flushing feeling in her face, face is visibly red around cheek/nose area.  Stopped Gadzyva, increased Normal Saline, took VS (see flowsheet), called Dr. Rogue Bussing who ordered Pepcid (see MAR) and instructed to wait 20 minutes and see if symptoms resolve.    13:20 - Dr. Rogue Bussing came by to see patient, ordered an additional 25 mg of IV Benadryl and instructed to wait another 20 minutes and re-evaluate.. Patient has no further complaints at this time.  13:55 - spoke with Dr. Rogue Bussing, patient reports flush feeling has totally resolved, no further complaints.  Dr. Rogue Bussing states to give SoluMedrol 50 mg, wait 10 minutes and restart Gadzyva, continue to monitor patient.    14:10 - restarted Lorenda Ishihara

## 2016-11-08 NOTE — Assessment & Plan Note (Addendum)
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 CT SEP 27th 2018-worsening axillary; mediastinal; retroperitoneal pelvic lymphadenopathy. Platelet count 90s.  # Proceed with Gazyva infusion with treatment #1 day-1. Discussed the potential infusion reactions. Recommend premedications. Discussed treatments are palliative/to control the disease; and not curative.  # CKD- creatinine- 1.3/ discussed with pt; no hydronephrosis.   # Poor IV access- wants to hold off port for now.   # Thrombocytopenia- 90s asymptomatic; on Xarelto  # Rectal thickening- on the CT scan incidentally. S/p colonoscopy- WNL.  # follow up 1 week/infusion/cbc; follow up in 2 weeks/labs/ infusion.   Addendum: Patient had grade 1 infusion reaction mild; infusions stopped for about half an hour received Pepcid/Benadryl ;Solu Medrol 50 mg- was able to finish the infusion.

## 2016-11-08 NOTE — Progress Notes (Signed)
Jacqueline Webb  Patient Care Team: [name], MD as PCP - General (Unknown Physician Specialty) Bary Castilla, Forest Gleason, MD (General Surgery) Forest Gleason, MD (Oncology) Corey Skains, MD as Consulting Physician (Cardiology) Anell Barr, OD as Consulting Physician (Optometry)  Cancer Staging Lymphoma [facility]) Staging form: Lymphoid Neoplasms, AJCC 6th Edition - Clinical: No stage assigned - Unsigned    Oncology History   1. Lymphoma low-grade, status Rituxan therapy. 2. Recurrent disease by clinical examination in December of 2012 3. Ovarian mass on PET scan in December of 2012 (normal CA 125). Ovarian mass has resolved after rituximab therapy. 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  Patient had total 5 cycles of chemotherapy with bendamustine and rituximab and 6 cycle was omitted because of significant side effects with and weakness and fatigue.  # OCT 2016- PET significant response; ON surveillance  # 18th OCT 2018- Gazyva;SEP 2018-/OCT 2018 CT scan- progression.   # 11/06/2016- colonoscopy [Dr.Byrnett- 2 polyps]  -------------------------------------------------------------------    # left ? Adnexal mass- ? Etiology; mild SUV uptake incidental [stable compared to previous PET in 2016] Previous workup 2015- vaginal ultrasound negative. MRI February 2018- approximately 3 cm in size; slow increase in the size of the left adnexal mass over at least 3 years- suspect involvement of lymphoma rather than a primary gynecologic malignancy.     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   female pleasant patient above history of Recurrent B-cell lymphoma Small cell type- is progression noted on recent imaging- is here to proceed with Gazyva.  Patient interim underwent colonoscopy- uneventful.   Patient notes to have increasing lump in the right axilla. Also notes to have lumps in the neck. Otherwise denies any night sweats fevers or chills. Denies any extreme fatigue or weight loss. Denies any blood in stools black red stools or constipation. Otherwise denies any bleeding or easy bruising. Patient has been started on premedications for the upcoming Gazyva infusion.   REVIEW OF SYSTEMS:  A complete 10 point review of system is done which is negative except mentioned above/history of present illness.   PAST MEDICAL HISTORY :  Past Medical History:  Diagnosis Date  . A-fib ([place])   . Benign neoplasm of rectum and anal canal   . 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 Hypercholesteremia   . Hypertension   . Hyperthyroidism   . Lymphoma ([place]) 2007  . Non Hodgkin's lymphoma ([place]) 01/22/2006  . Personal history of chemotherapy     PAST SURGICAL HISTORY :   Past Surgical History:  Procedure Laterality Date  . ABDOMINAL HYSTERECTOMY  1958  . APPENDECTOMY    . BREAST BIOPSY Right 1970   right breast biopsy with clip- neg  . BREAST BIOPSY Left 2002   left breast biopsy with clip-neg  . BREAST BIOPSY Right 2014   right breast biopsy with clip-neg  . BREAST CYST ASPIRATION Bilateral 2000   bilateral fine needle aspiration  . BREAST SURGERY Left 1990   biopsy  . BREAST SURGERY Right May 08, 2012  complex fibroadenoma without malignancy.  [name] CARDIAC CATHETERIZATION  2014  . COLONOSCOPY  2009, 2012    hyperplastic rectal polyp 2012 tubular adenoma of proximal ascending colon 2000 9 repeat exam due to 2017.  [name] COLONOSCOPY WITH PROPOFOL N/A 11/06/2016   Procedure: COLONOSCOPY WITH PROPOFOL;  Surgeon: Robert Bellow,  MD;  Location: [facility] ENDOSCOPY;  Service: Endoscopy;  Laterality: N/A;  . CORONARY ARTERY BYPASS GRAFT  1999  . FLEXIBLE SIGMOIDOSCOPY  1998    FAMILY HISTORY :   Family History  Problem Relation Age of Onset  . Lung cancer Father        Died age 28  . Heart disease Mother        Died age 35  . Asthma Mother   . CAD Brother        double bypass  . Heart attack Sister   . Tuberculosis Sister        34 years old  . Lung cancer Sister        Died in her 26s  . Angina Sister   . Breast cancer Sister 63  . Rheum arthritis Sister   . Aneurysm Sister        Brain, died age 71  . Angina Sister   . Heart disease Brother        Stent placement  . Heart disease Brother        stent placement  . Cancer Other        breast cancer niece, pancreatic cancer niece  . Breast cancer Other   . Ovarian cancer Neg Hx   . Diabetes Neg Hx     SOCIAL HISTORY:   Social History  Substance Use Topics  . Smoking status: Never Smoker  . Smokeless tobacco: Never Used  . Alcohol use 1.2 - 1.8 oz/week    1 - 2 Glasses of wine, 1 Shots of liquor per week     Comment: daily-     ALLERGIES:  has No Known Allergies.  MEDICATIONS:  Current Outpatient Prescriptions  Medication Sig Dispense Refill  . acyclovir (ZOVIRAX) 400 MG tablet Take 1 tablet (400 mg total) by mouth daily. 30 tablet 5  . dexamethasone (DECADRON) 4 MG tablet Take  1 tablet daily Start 2 days prior to infusion; Take for 2 days each cycle Do not take on the day of infusion. 60 tablet 3  . levothyroxine (SYNTHROID, LEVOTHROID) 88 MCG tablet TAKE 1 TABLET BY MOUTH  DAILY BEFORE BREAKFAST 90 tablet 3  . lisinopril-hydrochlorothiazide (PRINZIDE,ZESTORETIC) 20-12.5 MG tablet TAKE 1 TABLET BY MOUTH  DAILY 90 tablet 3  . Melatonin 1 MG CAPS Take 1 capsule by mouth at bedtime.     . metoprolol succinate (TOPROL-XL) 25 MG 24 hr tablet Take 25 mg by mouth daily.     . montelukast (SINGULAIR) 10 MG tablet Take 1 tablet (10 mg total) by mouth  at bedtime. Start 2 days prior to infusion. Take it for 4 days. 60 tablet 0  . oxybutynin (DITROPAN) 5 MG tablet     . sulfamethoxazole-trimethoprim (BACTRIM DS,SEPTRA DS) 800-160 MG tablet Take 1 tablet by mouth 3 (three) times a week. 12 tablet 5  . XARELTO 20 MG TABS tablet Take 20 mg by mouth daily with supper.      No current facility-administered medications for this visit.     PHYSICAL EXAMINATION: ECOG PERFORMANCE STATUS: 0 - Asymptomatic  BP 134/87 (BP Location: Left Arm, Patient Position: Sitting)   Pulse 73  Temp (!) 97 F (36.1 C)   Resp 20   Ht 5\' 5"  (1.651 m)   Wt 140 lb (63.5 kg)   BMI 23.30 kg/m   Filed Weights   11/08/16 0905  Weight: 140 lb (63.5 kg)    GENERAL: Well-nourished well-developed; Alert, no distress and comfortable.  Accompanied by her husband. EYES: no pallor or icterus OROPHARYNX: no thrush or ulceration; good dentition  NECK: supple, no masses felt LYMPH: Bil rubbery 1-2 cm palpable lymphadenopathy in the axillary region; 1 cm right level II  LUNGS: clear to auscultation and  No wheeze or crackles HEART/CVS: regular rate & rhythm and no murmurs; No lower extremity edema ABDOMEN:abdomen soft, non-tender and normal bowel sounds Musculoskeletal:no cyanosis of digits and no clubbing  PSYCH: alert & oriented x 3 with fluent speech NEURO: no focal motor/sensory deficits SKIN:  no rashes or significant lesions  LABORATORY DATA:  I have reviewed the data as listed    Component Value Date/Time   NA 134 (L) 11/08/2016 0845   NA 144 02/13/2016 1412   NA 141 05/11/2014 0853   K 3.8 11/08/2016 0845   K 3.6 05/11/2014 0853   CL 98 (L) 11/08/2016 0845   CL 103 05/11/2014 0853   CO2 27 11/08/2016 0845   CO2 29 05/11/2014 0853   GLUCOSE 108 (H) 11/08/2016 0845   GLUCOSE 107 (H) 05/11/2014 0853   BUN 23 (H) 11/08/2016 0845   BUN 24 02/13/2016 1412   BUN 22 (H) 05/11/2014 0853   CREATININE 1.20 (H) 11/08/2016 0845   CREATININE 1.07 (H)  05/11/2014 0853   CALCIUM 10.3 11/08/2016 0845   CALCIUM 10.1 05/11/2014 0853   PROT 7.2 11/08/2016 0845   PROT 6.6 02/13/2016 1412   PROT 7.3 05/11/2014 0853   ALBUMIN 4.2 11/08/2016 0845   ALBUMIN 4.2 02/13/2016 1412   ALBUMIN 4.7 05/11/2014 0853   AST 31 11/08/2016 0845   AST 29 05/11/2014 0853   ALT 22 11/08/2016 0845   ALT 20 05/11/2014 0853   ALKPHOS 59 11/08/2016 0845   ALKPHOS 47 05/11/2014 0853   BILITOT 0.7 11/08/2016 0845   BILITOT 0.6 02/13/2016 1412   BILITOT 1.0 05/11/2014 0853   GFRNONAA 42 (L) 11/08/2016 0845   GFRNONAA 51 (L) 05/11/2014 0853   GFRAA 49 (L) 11/08/2016 0845   GFRAA 59 (L) 05/11/2014 0853    No results found for: SPEP, UPEP  Lab Results  Component Value Date   WBC 7.3 11/08/2016   NEUTROABS 4.7 11/08/2016   HGB 14.0 11/08/2016   HCT 41.2 11/08/2016   MCV 87.7 11/08/2016   PLT 93 (L) 11/08/2016      Chemistry      Component Value Date/Time   NA 134 (L) 11/08/2016 0845   NA 144 02/13/2016 1412   NA 141 05/11/2014 0853   K 3.8 11/08/2016 0845   K 3.6 05/11/2014 0853   CL 98 (L) 11/08/2016 0845   CL 103 05/11/2014 0853   CO2 27 11/08/2016 0845   CO2 29 05/11/2014 0853   BUN 23 (H) 11/08/2016 0845   BUN 24 02/13/2016 1412   BUN 22 (H) 05/11/2014 0853   CREATININE 1.20 (H) 11/08/2016 0845   CREATININE 1.07 (H) 05/11/2014 0853   GLU 98 05/25/2013      Component Value Date/Time   CALCIUM 10.3 11/08/2016 0845   CALCIUM 10.1 05/11/2014 0853   ALKPHOS 59 11/08/2016 0845   ALKPHOS 47 05/11/2014 0853   AST 31 11/08/2016 0845  AST 29 05/11/2014 0853   ALT 22 11/08/2016 0845   ALT 20 05/11/2014 0853   BILITOT 0.7 11/08/2016 0845   BILITOT 0.6 02/13/2016 1412   BILITOT 1.0 05/11/2014 0853     : 1. No evidence of persistent small bowel intussusception. No definite small bowel neoplasm identified on enterography images. 2. Worsening lymphadenopathy throughout the abdomen and pelvis, concerning for recurrent lymphoma. 3.  Circumferential thickening of the distal rectum with some borderline enlarged mesorectal lymph nodes. These findings are nonspecific, but correlation with sigmoidoscopy or colonoscopy should be considered if there is any clinical concern for underlying colorectal neoplasm. 4. Left adnexal mass measuring 3.3 x 4.2 x 3.8 cm, similar to prior examinations. 5. Cardiomegaly with biatrial dilatation. 6. Aortic atherosclerosis, in addition to least 2 vessel coronary artery disease. Assessment for potential risk factor modification, dietary therapy or pharmacologic therapy may be warranted, if clinically indicated. 7. Additional incidental findings, as above.  Aortic Atherosclerosis (ICD10-I70.0).   Electronically Signed   By: Vinnie Langton M.D.   On: 10/22/2016 12:21   RADIOGRAPHIC STUDIES: I have personally reviewed the radiological images as listed and agreed with the findings in the report. No results found.   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 CT SEP 27th 2018-worsening axillary; mediastinal; retroperitoneal pelvic lymphadenopathy. Platelet count 90s.  # Proceed with Gazyva infusion with treatment #1 day-1. Discussed the potential infusion reactions. Recommend premedications. Discussed treatments are palliative/to control the disease; and not curative.  # CKD- creatinine- 1.3/ discussed with pt; no hydronephrosis.   # Poor IV access- wants to hold off port for now.   # Thrombocytopenia- 90s asymptomatic; on Xarelto  # Rectal thickening- on the CT scan incidentally. S/p colonoscopy- WNL.  # follow up 1 week/infusion/cbc; follow up in 2 weeks/labs/ infusion.   Addendum: Patient had grade 1 infusion reaction mild; infusions stopped for about half an hour received Pepcid/Benadryl ;Solu Medrol 50 mg- was able to  finish the infusion.    Orders Placed This Encounter  Procedures  . CBC with Differential/Platelet    Standing Status:   Future    Standing Expiration Date:   11/08/2017  . CBC with Differential/Platelet    Standing Status:   Future    Standing Expiration Date:   11/08/2017  . Comprehensive metabolic panel    Standing Status:   Future    Standing Expiration Date:   11/08/2017   All questions were answered. The patient knows to call the clinic with any problems, questions or concerns.      Cammie Sickle, MD 11/08/2016 5:57 PM

## 2016-11-09 ENCOUNTER — Other Ambulatory Visit: Payer: Self-pay | Admitting: Oncology

## 2016-11-09 ENCOUNTER — Inpatient Hospital Stay: Payer: Medicare Other

## 2016-11-09 VITALS — BP 101/68 | HR 83 | Temp 97.5°F | Resp 20

## 2016-11-09 DIAGNOSIS — Z5112 Encounter for antineoplastic immunotherapy: Secondary | ICD-10-CM | POA: Diagnosis not present

## 2016-11-09 DIAGNOSIS — R590 Localized enlarged lymph nodes: Secondary | ICD-10-CM | POA: Diagnosis not present

## 2016-11-09 DIAGNOSIS — I129 Hypertensive chronic kidney disease with stage 1 through stage 4 chronic kidney disease, or unspecified chronic kidney disease: Secondary | ICD-10-CM | POA: Diagnosis not present

## 2016-11-09 DIAGNOSIS — C8308 Small cell B-cell lymphoma, lymph nodes of multiple sites: Secondary | ICD-10-CM

## 2016-11-09 DIAGNOSIS — N189 Chronic kidney disease, unspecified: Secondary | ICD-10-CM | POA: Diagnosis not present

## 2016-11-09 DIAGNOSIS — D696 Thrombocytopenia, unspecified: Secondary | ICD-10-CM | POA: Diagnosis not present

## 2016-11-09 MED ORDER — SODIUM CHLORIDE 0.9 % IV SOLN
Freq: Once | INTRAVENOUS | Status: AC
Start: 2016-11-09 — End: 2016-11-09
  Administered 2016-11-09: 09:00:00 via INTRAVENOUS
  Filled 2016-11-09: qty 1000

## 2016-11-09 MED ORDER — DIPHENHYDRAMINE HCL 50 MG/ML IJ SOLN
50.0000 mg | Freq: Once | INTRAMUSCULAR | Status: AC
  Administered 2016-11-09: 50 mg via INTRAVENOUS
  Filled 2016-11-09: qty 1

## 2016-11-09 MED ORDER — SODIUM CHLORIDE 0.9 % IV SOLN
900.0000 mg | Freq: Once | INTRAVENOUS | Status: AC
  Administered 2016-11-09: 900 mg via INTRAVENOUS
  Filled 2016-11-09: qty 36

## 2016-11-09 MED ORDER — FAMOTIDINE IN NACL 20-0.9 MG/50ML-% IV SOLN
20.0000 mg | Freq: Once | INTRAVENOUS | Status: AC
  Administered 2016-11-09: 20 mg via INTRAVENOUS
  Filled 2016-11-09: qty 50

## 2016-11-09 MED ORDER — METHYLPREDNISOLONE SODIUM SUCC 40 MG IJ SOLR
20.0000 mg | Freq: Once | INTRAMUSCULAR | Status: AC
  Administered 2016-11-09: 20 mg via INTRAVENOUS
  Filled 2016-11-09: qty 1

## 2016-11-09 MED ORDER — SODIUM CHLORIDE 0.9 % IV SOLN
20.0000 mg | Freq: Once | INTRAVENOUS | Status: AC
  Administered 2016-11-09: 20 mg via INTRAVENOUS
  Filled 2016-11-09: qty 2

## 2016-11-09 MED ORDER — ACETAMINOPHEN 325 MG PO TABS
650.0000 mg | ORAL_TABLET | Freq: Once | ORAL | Status: AC
  Administered 2016-11-09: 650 mg via ORAL
  Filled 2016-11-09: qty 2

## 2016-11-15 ENCOUNTER — Inpatient Hospital Stay: Payer: Medicare Other

## 2016-11-15 VITALS — BP 111/78 | HR 71 | Temp 96.8°F | Resp 20 | Ht 65.0 in | Wt 148.0 lb

## 2016-11-15 DIAGNOSIS — I129 Hypertensive chronic kidney disease with stage 1 through stage 4 chronic kidney disease, or unspecified chronic kidney disease: Secondary | ICD-10-CM | POA: Diagnosis not present

## 2016-11-15 DIAGNOSIS — C8308 Small cell B-cell lymphoma, lymph nodes of multiple sites: Secondary | ICD-10-CM | POA: Diagnosis not present

## 2016-11-15 DIAGNOSIS — N189 Chronic kidney disease, unspecified: Secondary | ICD-10-CM | POA: Diagnosis not present

## 2016-11-15 DIAGNOSIS — R590 Localized enlarged lymph nodes: Secondary | ICD-10-CM | POA: Diagnosis not present

## 2016-11-15 DIAGNOSIS — D696 Thrombocytopenia, unspecified: Secondary | ICD-10-CM | POA: Diagnosis not present

## 2016-11-15 DIAGNOSIS — Z5112 Encounter for antineoplastic immunotherapy: Secondary | ICD-10-CM | POA: Diagnosis not present

## 2016-11-15 LAB — CBC WITH DIFFERENTIAL/PLATELET
Basophils Absolute: 0.1 10*3/uL (ref 0–0.1)
Basophils Relative: 1 %
Eosinophils Absolute: 0.1 10*3/uL (ref 0–0.7)
Eosinophils Relative: 3 %
HCT: 42.3 % (ref 35.0–47.0)
Hemoglobin: 14.3 g/dL (ref 12.0–16.0)
LYMPHS ABS: 1 10*3/uL (ref 1.0–3.6)
LYMPHS PCT: 20 %
MCH: 29.4 pg (ref 26.0–34.0)
MCHC: 33.9 g/dL (ref 32.0–36.0)
MCV: 86.7 fL (ref 80.0–100.0)
MONO ABS: 0.5 10*3/uL (ref 0.2–0.9)
MONOS PCT: 9 %
Neutro Abs: 3.4 10*3/uL (ref 1.4–6.5)
Neutrophils Relative %: 67 %
PLATELETS: 143 10*3/uL — AB (ref 150–440)
RBC: 4.88 MIL/uL (ref 3.80–5.20)
RDW: 15.1 % — AB (ref 11.5–14.5)
WBC: 5 10*3/uL (ref 3.6–11.0)

## 2016-11-15 MED ORDER — ACETAMINOPHEN 325 MG PO TABS
650.0000 mg | ORAL_TABLET | Freq: Once | ORAL | Status: AC
  Administered 2016-11-15: 650 mg via ORAL
  Filled 2016-11-15: qty 2

## 2016-11-15 MED ORDER — SODIUM CHLORIDE 0.9 % IV SOLN
Freq: Once | INTRAVENOUS | Status: AC
  Administered 2016-11-15: 10:00:00 via INTRAVENOUS
  Filled 2016-11-15: qty 1000

## 2016-11-15 MED ORDER — OBINUTUZUMAB CHEMO INJECTION 1000 MG/40ML
1000.0000 mg | Freq: Once | INTRAVENOUS | Status: AC
  Administered 2016-11-15: 1000 mg via INTRAVENOUS
  Filled 2016-11-15: qty 40

## 2016-11-15 MED ORDER — SODIUM CHLORIDE 0.9 % IV SOLN
20.0000 mg | Freq: Once | INTRAVENOUS | Status: AC
  Administered 2016-11-15: 20 mg via INTRAVENOUS
  Filled 2016-11-15: qty 2

## 2016-11-15 MED ORDER — FAMOTIDINE IN NACL 20-0.9 MG/50ML-% IV SOLN
20.0000 mg | Freq: Once | INTRAVENOUS | Status: AC
Start: 2016-11-15 — End: 2016-11-15
  Administered 2016-11-15: 20 mg via INTRAVENOUS
  Filled 2016-11-15: qty 50

## 2016-11-15 MED ORDER — DIPHENHYDRAMINE HCL 50 MG/ML IJ SOLN
50.0000 mg | Freq: Once | INTRAMUSCULAR | Status: AC
  Administered 2016-11-15: 50 mg via INTRAVENOUS
  Filled 2016-11-15: qty 1

## 2016-11-22 ENCOUNTER — Inpatient Hospital Stay (HOSPITAL_BASED_OUTPATIENT_CLINIC_OR_DEPARTMENT_OTHER): Payer: Medicare Other | Admitting: Internal Medicine

## 2016-11-22 ENCOUNTER — Inpatient Hospital Stay: Payer: Medicare Other

## 2016-11-22 ENCOUNTER — Inpatient Hospital Stay: Payer: Medicare Other | Attending: Internal Medicine

## 2016-11-22 VITALS — BP 112/68 | HR 91 | Resp 14 | Wt 149.6 lb

## 2016-11-22 DIAGNOSIS — C8308 Small cell B-cell lymphoma, lymph nodes of multiple sites: Secondary | ICD-10-CM | POA: Diagnosis not present

## 2016-11-22 DIAGNOSIS — Z7901 Long term (current) use of anticoagulants: Secondary | ICD-10-CM | POA: Diagnosis not present

## 2016-11-22 DIAGNOSIS — Z79899 Other long term (current) drug therapy: Secondary | ICD-10-CM | POA: Insufficient documentation

## 2016-11-22 DIAGNOSIS — R1900 Intra-abdominal and pelvic swelling, mass and lump, unspecified site: Secondary | ICD-10-CM | POA: Insufficient documentation

## 2016-11-22 DIAGNOSIS — Z803 Family history of malignant neoplasm of breast: Secondary | ICD-10-CM

## 2016-11-22 DIAGNOSIS — Z9221 Personal history of antineoplastic chemotherapy: Secondary | ICD-10-CM

## 2016-11-22 DIAGNOSIS — I517 Cardiomegaly: Secondary | ICD-10-CM | POA: Insufficient documentation

## 2016-11-22 DIAGNOSIS — E7800 Pure hypercholesterolemia, unspecified: Secondary | ICD-10-CM | POA: Insufficient documentation

## 2016-11-22 DIAGNOSIS — I129 Hypertensive chronic kidney disease with stage 1 through stage 4 chronic kidney disease, or unspecified chronic kidney disease: Secondary | ICD-10-CM

## 2016-11-22 DIAGNOSIS — D696 Thrombocytopenia, unspecified: Secondary | ICD-10-CM | POA: Insufficient documentation

## 2016-11-22 DIAGNOSIS — Z801 Family history of malignant neoplasm of trachea, bronchus and lung: Secondary | ICD-10-CM | POA: Diagnosis not present

## 2016-11-22 DIAGNOSIS — Z86010 Personal history of colonic polyps: Secondary | ICD-10-CM

## 2016-11-22 DIAGNOSIS — R599 Enlarged lymph nodes, unspecified: Secondary | ICD-10-CM | POA: Diagnosis not present

## 2016-11-22 DIAGNOSIS — I4891 Unspecified atrial fibrillation: Secondary | ICD-10-CM | POA: Diagnosis not present

## 2016-11-22 DIAGNOSIS — E0590 Thyrotoxicosis, unspecified without thyrotoxic crisis or storm: Secondary | ICD-10-CM | POA: Diagnosis not present

## 2016-11-22 DIAGNOSIS — Z5112 Encounter for antineoplastic immunotherapy: Secondary | ICD-10-CM | POA: Diagnosis not present

## 2016-11-22 DIAGNOSIS — N189 Chronic kidney disease, unspecified: Secondary | ICD-10-CM

## 2016-11-22 DIAGNOSIS — I700 Atherosclerosis of aorta: Secondary | ICD-10-CM

## 2016-11-22 LAB — CBC WITH DIFFERENTIAL/PLATELET
Basophils Absolute: 0.1 10*3/uL (ref 0–0.1)
Basophils Relative: 2 %
EOS PCT: 1 %
Eosinophils Absolute: 0.1 10*3/uL (ref 0–0.7)
HCT: 39 % (ref 35.0–47.0)
Hemoglobin: 13.3 g/dL (ref 12.0–16.0)
LYMPHS ABS: 0.9 10*3/uL — AB (ref 1.0–3.6)
Lymphocytes Relative: 17 %
MCH: 29.4 pg (ref 26.0–34.0)
MCHC: 34.1 g/dL (ref 32.0–36.0)
MCV: 86.1 fL (ref 80.0–100.0)
MONO ABS: 0.4 10*3/uL (ref 0.2–0.9)
MONOS PCT: 8 %
Neutro Abs: 3.8 10*3/uL (ref 1.4–6.5)
Neutrophils Relative %: 72 %
PLATELETS: 130 10*3/uL — AB (ref 150–440)
RBC: 4.53 MIL/uL (ref 3.80–5.20)
RDW: 14.8 % — AB (ref 11.5–14.5)
WBC: 5.3 10*3/uL (ref 3.6–11.0)

## 2016-11-22 LAB — COMPREHENSIVE METABOLIC PANEL
ALT: 28 U/L (ref 14–54)
AST: 25 U/L (ref 15–41)
Albumin: 4.1 g/dL (ref 3.5–5.0)
Alkaline Phosphatase: 51 U/L (ref 38–126)
Anion gap: 7 (ref 5–15)
BUN: 28 mg/dL — ABNORMAL HIGH (ref 6–20)
CO2: 26 mmol/L (ref 22–32)
Calcium: 10 mg/dL (ref 8.9–10.3)
Chloride: 103 mmol/L (ref 101–111)
Creatinine, Ser: 1.36 mg/dL — ABNORMAL HIGH (ref 0.44–1.00)
GFR, EST AFRICAN AMERICAN: 42 mL/min — AB (ref >60)
GFR, EST NON AFRICAN AMERICAN: 36 mL/min — AB (ref >60)
Glucose, Bld: 98 mg/dL (ref 65–99)
POTASSIUM: 3.4 mmol/L — AB (ref 3.5–5.1)
Sodium: 136 mmol/L (ref 135–145)
Total Bilirubin: 0.8 mg/dL (ref 0.3–1.2)
Total Protein: 6.9 g/dL (ref 6.5–8.1)

## 2016-11-22 MED ORDER — ACETAMINOPHEN 325 MG PO TABS
650.0000 mg | ORAL_TABLET | Freq: Once | ORAL | Status: AC
  Administered 2016-11-22: 650 mg via ORAL
  Filled 2016-11-22: qty 2

## 2016-11-22 MED ORDER — SODIUM CHLORIDE 0.9 % IV SOLN
Freq: Once | INTRAVENOUS | Status: AC
  Administered 2016-11-22: 10:00:00 via INTRAVENOUS
  Filled 2016-11-22: qty 1000

## 2016-11-22 MED ORDER — SODIUM CHLORIDE 0.9 % IV SOLN
20.0000 mg | Freq: Once | INTRAVENOUS | Status: AC
  Administered 2016-11-22: 20 mg via INTRAVENOUS
  Filled 2016-11-22: qty 2

## 2016-11-22 MED ORDER — SODIUM CHLORIDE 0.9 % IV SOLN
1000.0000 mg | Freq: Once | INTRAVENOUS | Status: AC
  Administered 2016-11-22: 1000 mg via INTRAVENOUS
  Filled 2016-11-22: qty 40

## 2016-11-22 MED ORDER — DIPHENHYDRAMINE HCL 50 MG/ML IJ SOLN
50.0000 mg | Freq: Once | INTRAMUSCULAR | Status: AC
  Administered 2016-11-22: 50 mg via INTRAVENOUS
  Filled 2016-11-22: qty 1

## 2016-11-22 MED ORDER — FAMOTIDINE IN NACL 20-0.9 MG/50ML-% IV SOLN
20.0000 mg | Freq: Once | INTRAVENOUS | Status: AC
  Administered 2016-11-22: 20 mg via INTRAVENOUS
  Filled 2016-11-22: qty 50

## 2016-11-22 NOTE — Assessment & Plan Note (Addendum)
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 CT SEP 27th 2018-worsening axillary; mediastinal; retroperitoneal pelvic lymphadenopathy. Platelet count 90s. Patient currently on Gazyva status post cycle 1 day 8. Tolerated well without any major infusion reactions.  # Proceed with Gazyva infusion with treatment #1 day-15. No significant clinical response noted yet. Discussed that in general we repeat imaging after finishing 6 cycles; however based upon clinical situation might do a scan sooner.  # CKD- creatinine- 1.2/STABLE.   # Thrombocytopenia- 120s asymptomatic; on Xarelto  # follow up in 2 weeks/labs/ infusion.

## 2016-11-22 NOTE — Patient Instructions (Signed)
Stop dexamethasone prior to your next cycle in 2 weeks; take all other medications as recommended.

## 2016-11-22 NOTE — Progress Notes (Signed)
[place] OFFICE PROGRESS NOTE  Patient Care Team: [name], MD as PCP - General (Unknown Physician Specialty) Bary Castilla, Forest Gleason, MD (General Surgery) Forest Gleason, MD (Oncology) Corey Skains, MD as Consulting Physician (Cardiology) Anell Barr, OD as Consulting Physician (Optometry)  Cancer Staging Lymphoma [facility]) Staging form: Lymphoid Neoplasms, AJCC 6th Edition - Clinical: No stage assigned - Unsigned    Oncology History   1. Lymphoma low-grade, status Rituxan therapy. 2. Recurrent disease by clinical examination in December of 2012 3. Ovarian mass on PET scan in December of 2012 (normal CA 125). Ovarian mass has resolved after rituximab therapy. 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  Patient had total 5 cycles of chemotherapy with bendamustine and rituximab and 6 cycle was omitted because of significant side effects with and weakness and fatigue.  # OCT 2016- PET significant response; ON surveillance  # 18th OCT 2018- Gazyva;SEP 2018-/OCT 2018 CT scan- progression.   # 11/06/2016- colonoscopy [Dr.Byrnett- 2 polyps]  -------------------------------------------------------------------    # left ? Adnexal mass- ? Etiology; mild SUV uptake incidental [stable compared to previous PET in 2016] Previous workup 2015- vaginal ultrasound negative. MRI February 2018- approximately 3 cm in size; slow increase in the size of the left adnexal mass over at least 3 years- suspect involvement of lymphoma rather than a primary gynecologic malignancy.     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   female pleasant patient above history of Recurrent B-cell lymphoma Small cell type- currently status post cycle 1 of Gazyva- day # 8 approximately 1 week ago.  Patient had mild infusion reaction; needed extra dose of steroids; and interruption of the treatment. Otherwise uneventful.  Patient denies any fevers or chills. Denies any significant improvement of the lumps under the arm. No diarrhea or constipation.   REVIEW OF SYSTEMS:  A complete 10 point review of system is done which is negative except mentioned above/history of present illness.   PAST MEDICAL HISTORY :  Past Medical History:  Diagnosis Date  . A-fib ([place])   . Benign neoplasm of rectum and anal canal   . 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 Hypercholesteremia   . Hypertension   . Hyperthyroidism   . Lymphoma ([place]) 2007  . Non Hodgkin's lymphoma ([place]) 01/22/2006  . Personal history of chemotherapy     PAST SURGICAL HISTORY :   Past Surgical History:  Procedure Laterality Date  . ABDOMINAL HYSTERECTOMY  1958  . APPENDECTOMY    . BREAST BIOPSY Right 1970   right breast biopsy with clip- neg  . BREAST BIOPSY Left 2002   left breast biopsy with clip-neg  . BREAST BIOPSY Right 2014   right breast biopsy with clip-neg  . BREAST CYST ASPIRATION Bilateral 2000   bilateral fine needle aspiration  . BREAST SURGERY Left 1990   biopsy  . BREAST SURGERY Right May 08, 2012   complex fibroadenoma without malignancy.  [name] CARDIAC CATHETERIZATION  2014  . COLONOSCOPY  2009, 2012    hyperplastic rectal polyp 2012 tubular adenoma of proximal ascending  colon 2000 9 repeat exam due to 2017.  [name] CORONARY ARTERY BYPASS GRAFT  1999  . FLEXIBLE SIGMOIDOSCOPY  1998    FAMILY HISTORY :   Family History  Problem Relation Age of Onset  . Lung cancer Father        Died age 9  . Heart disease Mother        Died age 59  . Asthma Mother   . CAD Brother        double bypass  .  Heart attack Sister   . Tuberculosis Sister        55 years old  . Lung cancer Sister        Died in her 60s  . Angina Sister   . Breast cancer Sister 32  . Rheum arthritis Sister   . Aneurysm Sister        Brain, died age 36  . Angina Sister   . Heart disease Brother        Stent placement  . Heart disease Brother        stent placement  . Cancer Other        breast cancer niece, pancreatic cancer niece  . Breast cancer Other   . Ovarian cancer Neg Hx   . Diabetes Neg Hx     SOCIAL HISTORY:   Social History   Tobacco Use  . Smoking status: Never Smoker  . Smokeless tobacco: Never Used  Substance Use Topics  . Alcohol use: Yes    Alcohol/week: 1.2 - 1.8 oz    Types: 1 - 2 Glasses of wine, 1 Shots of liquor per week    Comment: daily-   . Drug use: No    ALLERGIES:  has No Known Allergies.  MEDICATIONS:  Current Outpatient Medications  Medication Sig Dispense Refill  . acyclovir (ZOVIRAX) 400 MG tablet Take 1 tablet (400 mg total) by mouth daily. 30 tablet 5  . dexamethasone (DECADRON) 4 MG tablet Take  1 tablet daily Start 2 days prior to infusion; Take for 2 days each cycle Do not take on the day of infusion. 60 tablet 3  . levothyroxine (SYNTHROID, LEVOTHROID) 88 MCG tablet TAKE 1 TABLET BY MOUTH  DAILY BEFORE BREAKFAST 90 tablet 3  . lisinopril-hydrochlorothiazide (PRINZIDE,ZESTORETIC) 20-12.5 MG tablet TAKE 1 TABLET BY MOUTH  DAILY 90 tablet 3  . Melatonin 1 MG CAPS Take 1 capsule by mouth at bedtime.     . metoprolol succinate (TOPROL-XL) 25 MG 24 hr tablet Take 25 mg by mouth daily.     . montelukast (SINGULAIR) 10 MG tablet Take 1 tablet (10 mg total) by mouth at bedtime. Start 2 days prior to infusion. Take it for 4 days. 60 tablet 0  . oxybutynin (DITROPAN) 5 MG tablet     . sulfamethoxazole-trimethoprim (BACTRIM DS,SEPTRA DS) 800-160 MG tablet Take 1 tablet by mouth 3 (three) times a week. 12 tablet 5  . XARELTO 20 MG TABS tablet Take 20 mg by mouth daily  with supper.      No current facility-administered medications for this visit.     PHYSICAL EXAMINATION: ECOG PERFORMANCE STATUS: 0 - Asymptomatic  BP 112/68 (BP Location: Left Arm, Patient Position: Sitting)   Pulse 91   Resp 14   Wt 149 lb 9.6 oz (67.9 kg)   BMI 24.89 kg/m   Filed Weights   11/22/16 0833  Weight: 149 lb 9.6 oz (67.9 kg)    GENERAL: Well-nourished well-developed; Alert, no distress and  comfortable.  Accompanied by her husband. EYES: no pallor or icterus OROPHARYNX: no thrush or ulceration; good dentition  NECK: supple, no masses felt LYMPH: Bil rubbery 1-2 cm palpable lymphadenopathy in the axillary region; 1 cm right level II  LUNGS: clear to auscultation and  No wheeze or crackles HEART/CVS: regular rate & rhythm and no murmurs; No lower extremity edema ABDOMEN:abdomen soft, non-tender and normal bowel sounds Musculoskeletal:no cyanosis of digits and no clubbing  PSYCH: alert & oriented x 3 with fluent speech NEURO: no focal motor/sensory deficits SKIN:  no rashes or significant lesions  LABORATORY DATA:  I have reviewed the data as listed    Component Value Date/Time   NA 136 11/22/2016 0802   NA 144 02/13/2016 1412   NA 141 05/11/2014 0853   K 3.4 (L) 11/22/2016 0802   K 3.6 05/11/2014 0853   CL 103 11/22/2016 0802   CL 103 05/11/2014 0853   CO2 26 11/22/2016 0802   CO2 29 05/11/2014 0853   GLUCOSE 98 11/22/2016 0802   GLUCOSE 107 (H) 05/11/2014 0853   BUN 28 (H) 11/22/2016 0802   BUN 24 02/13/2016 1412   BUN 22 (H) 05/11/2014 0853   CREATININE 1.36 (H) 11/22/2016 0802   CREATININE 1.07 (H) 05/11/2014 0853   CALCIUM 10.0 11/22/2016 0802   CALCIUM 10.1 05/11/2014 0853   PROT 6.9 11/22/2016 0802   PROT 6.6 02/13/2016 1412   PROT 7.3 05/11/2014 0853   ALBUMIN 4.1 11/22/2016 0802   ALBUMIN 4.2 02/13/2016 1412   ALBUMIN 4.7 05/11/2014 0853   AST 25 11/22/2016 0802   AST 29 05/11/2014 0853   ALT 28 11/22/2016 0802   ALT 20 05/11/2014  0853   ALKPHOS 51 11/22/2016 0802   ALKPHOS 47 05/11/2014 0853   BILITOT 0.8 11/22/2016 0802   BILITOT 0.6 02/13/2016 1412   BILITOT 1.0 05/11/2014 0853   GFRNONAA 36 (L) 11/22/2016 0802   GFRNONAA 51 (L) 05/11/2014 0853   GFRAA 42 (L) 11/22/2016 0802   GFRAA 59 (L) 05/11/2014 0853    No results found for: SPEP, UPEP  Lab Results  Component Value Date   WBC 5.3 11/22/2016   NEUTROABS 3.8 11/22/2016   HGB 13.3 11/22/2016   HCT 39.0 11/22/2016   MCV 86.1 11/22/2016   PLT 130 (L) 11/22/2016      Chemistry      Component Value Date/Time   NA 136 11/22/2016 0802   NA 144 02/13/2016 1412   NA 141 05/11/2014 0853   K 3.4 (L) 11/22/2016 0802   K 3.6 05/11/2014 0853   CL 103 11/22/2016 0802   CL 103 05/11/2014 0853   CO2 26 11/22/2016 0802   CO2 29 05/11/2014 0853   BUN 28 (H) 11/22/2016 0802   BUN 24 02/13/2016 1412   BUN 22 (H) 05/11/2014 0853   CREATININE 1.36 (H) 11/22/2016 0802   CREATININE 1.07 (H) 05/11/2014 0853   GLU 98 05/25/2013      Component Value Date/Time   CALCIUM 10.0 11/22/2016 0802   CALCIUM 10.1 05/11/2014 0853   ALKPHOS 51 11/22/2016 0802   ALKPHOS 47 05/11/2014 0853   AST 25 11/22/2016 0802   AST 29 05/11/2014 0853   ALT 28 11/22/2016 0802   ALT 20 05/11/2014 0853   BILITOT 0.8 11/22/2016 0802   BILITOT 0.6 02/13/2016 1412   BILITOT 1.0 05/11/2014 0853     : 1. No evidence of persistent small bowel intussusception. No definite small bowel neoplasm identified on enterography images.  2. Worsening lymphadenopathy throughout the abdomen and pelvis, concerning for recurrent lymphoma. 3. Circumferential thickening of the distal rectum with some borderline enlarged mesorectal lymph nodes. These findings are nonspecific, but correlation with sigmoidoscopy or colonoscopy should be considered if there is any clinical concern for underlying colorectal neoplasm. 4. Left adnexal mass measuring 3.3 x 4.2 x 3.8 cm, similar to prior examinations. 5.  Cardiomegaly with biatrial dilatation. 6. Aortic atherosclerosis, in addition to least 2 vessel coronary artery disease. Assessment for potential risk factor modification, dietary therapy or pharmacologic therapy may be warranted, if clinically indicated. 7. Additional incidental findings, as above.  Aortic Atherosclerosis (ICD10-I70.0).   Electronically Signed   By: Vinnie Langton M.D.   On: 10/22/2016 12:21   RADIOGRAPHIC STUDIES: I have personally reviewed the radiological images as listed and agreed with the findings in the report. No results found.   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 CT SEP 27th 2018-worsening axillary; mediastinal; retroperitoneal pelvic lymphadenopathy. Platelet count 90s. Patient currently on Gazyva status post cycle 1 day 8. Tolerated well without any major infusion reactions.  # Proceed with Gazyva infusion with treatment #1 day-15. No significant clinical response noted yet. Discussed that in general we repeat imaging after finishing 6 cycles; however based upon clinical situation might do a scan sooner.  # CKD- creatinine- 1.2/STABLE.   # Thrombocytopenia- 120s asymptomatic; on Xarelto  # follow up in 2 weeks/labs/ infusion.   Orders Placed This Encounter  Procedures  . CBC with Differential/Platelet    Standing Status:   Future    Standing Expiration Date:   11/22/2017  . Comprehensive metabolic panel    Standing Status:   Future    Standing Expiration Date:   11/22/2017   All questions were answered. The patient knows to call the clinic with any problems, questions or concerns.      Cammie Sickle, MD 11/25/2016 5:30 PM

## 2016-11-23 ENCOUNTER — Other Ambulatory Visit: Payer: Self-pay | Admitting: Family Medicine

## 2016-11-23 DIAGNOSIS — Z1231 Encounter for screening mammogram for malignant neoplasm of breast: Secondary | ICD-10-CM

## 2016-12-03 ENCOUNTER — Ambulatory Visit (INDEPENDENT_AMBULATORY_CARE_PROVIDER_SITE_OTHER): Payer: Medicare Other | Admitting: Family Medicine

## 2016-12-03 ENCOUNTER — Other Ambulatory Visit: Payer: Self-pay

## 2016-12-03 VITALS — BP 112/70 | HR 60 | Temp 98.3°F | Resp 16 | Wt 149.0 lb

## 2016-12-03 DIAGNOSIS — C8308 Small cell B-cell lymphoma, lymph nodes of multiple sites: Secondary | ICD-10-CM

## 2016-12-03 DIAGNOSIS — E039 Hypothyroidism, unspecified: Secondary | ICD-10-CM

## 2016-12-03 DIAGNOSIS — I25810 Atherosclerosis of coronary artery bypass graft(s) without angina pectoris: Secondary | ICD-10-CM

## 2016-12-03 DIAGNOSIS — I4819 Other persistent atrial fibrillation: Secondary | ICD-10-CM

## 2016-12-03 DIAGNOSIS — I481 Persistent atrial fibrillation: Secondary | ICD-10-CM

## 2016-12-03 DIAGNOSIS — E7800 Pure hypercholesterolemia, unspecified: Secondary | ICD-10-CM

## 2016-12-03 DIAGNOSIS — I10 Essential (primary) hypertension: Secondary | ICD-10-CM

## 2016-12-03 MED ORDER — EZETIMIBE 10 MG PO TABS: 10.0000 mg | ORAL_TABLET | Freq: Every day | ORAL | 12 refills | Status: DC

## 2016-12-03 NOTE — Progress Notes (Signed)
Jacqueline Webb  MRN: [id number] DOB: 01/08/1941  Subjective:  HPI   The patient is a 76 year old female who presents for follow up of chronic issues.  Shew as last seen on 06/04/16 and not changes were made at that time.    Atrial fibrillation- this was stable on last visit.   Hypothyroidism-Last TSH done in May 2018 was normal. She is currently on Levothyroxine 88 mcg daily.   Hypercholesterolemia-The patient has been intolerant of statins in the past and her last note stated to consider Zetia in the future.    The patient is follow by Urology for Over active bladder and Oncology for Lymphoma.  Patient started treatment again about 3 weeks ago.  Patient Active Problem List   Diagnosis Date Noted  . Goals of care, counseling/discussion 11/08/2016  . Abnormal CT scan, pelvis 11/05/2016  . Intussusception of small bowel ([place]) 08/22/2016  . Mass of left ovary 03/06/2016  . Cystocele, midline 12/27/2015  . Non Hodgkin's lymphoma ([place])   . Cystocele 10/19/2015  . Status post hysterectomy 10/19/2015  . Vaginal atrophy 10/19/2015  . 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 Moderate mitral insufficiency 08/10/2015  . Persistent atrial fibrillation ([place]) 08/10/2015  . Urge incontinence of urine 06/07/2015  . Personal history of surgery to heart and great vessels, presenting hazards to health 05/14/2014  . Raynaud's syndrome 05/14/2014  . Adult hypothyroidism 05/14/2014  . Hypercholesteremia 05/14/2014  . Coronary artery abnormality 05/14/2014  . Essential (primary) hypertension 05/14/2014  . Personal history of malignant neoplasm of breast 05/14/2014  . Lymphoma ([place]) 04/14/2014  . 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 Non Hodgkin's lymphoma ([place]) 01/22/2006    Past Medical History:  Diagnosis Date  . A-fib ([place])   . Benign neoplasm of rectum and anal canal   . Chronic leukemia of unspecified cell type, without  mention of having achieved remission   . Hypercholesteremia   . Hypertension   . Hyperthyroidism   . Lymphoma ([place]) 2007  . Non Hodgkin's lymphoma ([place]) 01/22/2006  . Personal history of chemotherapy     Social History   Socioeconomic History  . Marital status: Married    Spouse name: Not on file  . Number of children: Not on file  . Years of education: Not on file  . Highest education level: Not on file  Social Needs  . Financial resource strain: Not on file  . Food insecurity - worry: Not on file  . Food insecurity - inability: Not on file  . Transportation needs - medical: Not on file  . Transportation needs - non-medical: Not on file  Occupational History  . Not on file  Tobacco Use  . Smoking status: Never Smoker  . Smokeless tobacco: Never Used  Substance and Sexual Activity  . Alcohol use: Yes    Alcohol/week: 1.2 - 1.8 oz    Types: 1 - 2 Glasses of wine, 1 Shots of liquor per week    Comment: daily-   . Drug use: No  . Sexual activity: No  Other Topics Concern  . Not on file  Social History Narrative  . Not on file    Outpatient Encounter Medications as of 12/03/2016  Medication Sig Note  . acyclovir (ZOVIRAX) 400 MG tablet Take 1 tablet (400 mg total) by mouth daily.   [name] dexamethasone (DECADRON) 4 MG tablet Take  1 tablet daily Start 2 days prior to  infusion; Take for 2 days each cycle Do not take on the day of infusion.   [name] levothyroxine (SYNTHROID, LEVOTHROID) 88 MCG tablet TAKE 1 TABLET BY MOUTH  DAILY BEFORE BREAKFAST   . lisinopril-hydrochlorothiazide (PRINZIDE,ZESTORETIC) 20-12.5 MG tablet TAKE 1 TABLET BY MOUTH  DAILY   . Melatonin 1 MG CAPS Take 1 capsule by mouth at bedtime.    . metoprolol succinate (TOPROL-XL) 25 MG 24 hr tablet Take 25 mg by mouth daily.  08/18/2015: Received from: External Pharmacy  . montelukast (SINGULAIR) 10 MG tablet Take 1 tablet (10 mg total) by mouth at bedtime. Start 2 days prior to infusion. Take it for 4 days.   [name]  oxybutynin (DITROPAN) 5 MG tablet    . sulfamethoxazole-trimethoprim (BACTRIM DS,SEPTRA DS) 800-160 MG tablet Take 1 tablet by mouth 3 (three) times a week.   Alveda Reasons 20 MG TABS tablet Take 20 mg by mouth daily with supper.  08/18/2015: Received from: External Pharmacy   No facility-administered encounter medications on file as of 12/03/2016.     No Known Allergies  Review of Systems  Constitutional: Negative for fever and malaise/fatigue.  Eyes: Negative.   Respiratory: Negative for cough, shortness of breath and wheezing.   Cardiovascular: Positive for leg swelling (left leg only s/p vascular surgery on that leg.). Negative for chest pain, palpitations, orthopnea and claudication.  Gastrointestinal: Negative.   Genitourinary: Negative.   Skin: Negative.   Neurological: Negative for weakness.  Endo/Heme/Allergies: Negative.   Psychiatric/Behavioral: Negative.     Objective:  BP 112/70 (BP Location: Right Arm, Patient Position: Sitting, Cuff Size: Normal)   Pulse 60   Temp 98.3 F (36.8 C) (Oral)   Resp 16   Wt 149 lb (67.6 kg)   BMI 24.79 kg/m   Physical Exam  Constitutional: She is well-developed, well-nourished, and in no distress.  HENT:  Head: Normocephalic and atraumatic.  Eyes: Conjunctivae are normal. No scleral icterus.  Neck: No thyromegaly present.  Cardiovascular: Normal rate, regular rhythm and normal heart sounds.  Pulmonary/Chest: Effort normal and breath sounds normal.  Abdominal: Soft.    Assessment and Plan :  Lymphoma Per oncology. CAD Afib HLD HTN  I have done the exam and reviewed the chart and it is accurate to the best of my knowledge. Development worker, community has been used and  any errors in dictation or transcription are unintentional. Miguel Aschoff M.D. [facility]

## 2016-12-06 ENCOUNTER — Inpatient Hospital Stay (HOSPITAL_BASED_OUTPATIENT_CLINIC_OR_DEPARTMENT_OTHER): Payer: Medicare Other | Admitting: Internal Medicine

## 2016-12-06 ENCOUNTER — Inpatient Hospital Stay: Payer: Medicare Other

## 2016-12-06 ENCOUNTER — Other Ambulatory Visit: Payer: Self-pay

## 2016-12-06 VITALS — BP 102/60 | HR 62 | Temp 97.8°F | Ht 65.0 in | Wt 152.0 lb

## 2016-12-06 DIAGNOSIS — C8308 Small cell B-cell lymphoma, lymph nodes of multiple sites: Secondary | ICD-10-CM

## 2016-12-06 DIAGNOSIS — E7800 Pure hypercholesterolemia, unspecified: Secondary | ICD-10-CM | POA: Diagnosis not present

## 2016-12-06 DIAGNOSIS — Z5112 Encounter for antineoplastic immunotherapy: Secondary | ICD-10-CM | POA: Diagnosis not present

## 2016-12-06 DIAGNOSIS — I129 Hypertensive chronic kidney disease with stage 1 through stage 4 chronic kidney disease, or unspecified chronic kidney disease: Secondary | ICD-10-CM | POA: Diagnosis not present

## 2016-12-06 DIAGNOSIS — E0590 Thyrotoxicosis, unspecified without thyrotoxic crisis or storm: Secondary | ICD-10-CM

## 2016-12-06 DIAGNOSIS — Z86010 Personal history of colonic polyps: Secondary | ICD-10-CM

## 2016-12-06 DIAGNOSIS — Z79899 Other long term (current) drug therapy: Secondary | ICD-10-CM | POA: Diagnosis not present

## 2016-12-06 DIAGNOSIS — I4891 Unspecified atrial fibrillation: Secondary | ICD-10-CM

## 2016-12-06 DIAGNOSIS — N189 Chronic kidney disease, unspecified: Secondary | ICD-10-CM | POA: Diagnosis not present

## 2016-12-06 DIAGNOSIS — I517 Cardiomegaly: Secondary | ICD-10-CM | POA: Diagnosis not present

## 2016-12-06 DIAGNOSIS — Z7901 Long term (current) use of anticoagulants: Secondary | ICD-10-CM

## 2016-12-06 DIAGNOSIS — Z9221 Personal history of antineoplastic chemotherapy: Secondary | ICD-10-CM

## 2016-12-06 DIAGNOSIS — Z803 Family history of malignant neoplasm of breast: Secondary | ICD-10-CM

## 2016-12-06 DIAGNOSIS — R599 Enlarged lymph nodes, unspecified: Secondary | ICD-10-CM | POA: Diagnosis not present

## 2016-12-06 DIAGNOSIS — R1900 Intra-abdominal and pelvic swelling, mass and lump, unspecified site: Secondary | ICD-10-CM | POA: Diagnosis not present

## 2016-12-06 DIAGNOSIS — D696 Thrombocytopenia, unspecified: Secondary | ICD-10-CM

## 2016-12-06 DIAGNOSIS — I700 Atherosclerosis of aorta: Secondary | ICD-10-CM | POA: Diagnosis not present

## 2016-12-06 DIAGNOSIS — Z801 Family history of malignant neoplasm of trachea, bronchus and lung: Secondary | ICD-10-CM

## 2016-12-06 LAB — CBC WITH DIFFERENTIAL/PLATELET
BASOS ABS: 0.1 10*3/uL (ref 0–0.1)
Basophils Relative: 1 %
Eosinophils Absolute: 0.1 10*3/uL (ref 0–0.7)
Eosinophils Relative: 2 %
HEMATOCRIT: 39.4 % (ref 35.0–47.0)
HEMOGLOBIN: 13.2 g/dL (ref 12.0–16.0)
LYMPHS PCT: 29 %
Lymphs Abs: 1.5 10*3/uL (ref 1.0–3.6)
MCH: 29.8 pg (ref 26.0–34.0)
MCHC: 33.5 g/dL (ref 32.0–36.0)
MCV: 89 fL (ref 80.0–100.0)
MONO ABS: 0.6 10*3/uL (ref 0.2–0.9)
Monocytes Relative: 12 %
NEUTROS ABS: 2.9 10*3/uL (ref 1.4–6.5)
NEUTROS PCT: 56 %
Platelets: 105 10*3/uL — ABNORMAL LOW (ref 150–440)
RBC: 4.43 MIL/uL (ref 3.80–5.20)
RDW: 15.8 % — ABNORMAL HIGH (ref 11.5–14.5)
WBC: 5.2 10*3/uL (ref 3.6–11.0)

## 2016-12-06 LAB — COMPREHENSIVE METABOLIC PANEL
ALBUMIN: 4.1 g/dL (ref 3.5–5.0)
ALK PHOS: 54 U/L (ref 38–126)
ALT: 24 U/L (ref 14–54)
AST: 28 U/L (ref 15–41)
Anion gap: 8 (ref 5–15)
BILIRUBIN TOTAL: 0.7 mg/dL (ref 0.3–1.2)
BUN: 35 mg/dL — AB (ref 6–20)
CHLORIDE: 104 mmol/L (ref 101–111)
CO2: 26 mmol/L (ref 22–32)
Calcium: 10.3 mg/dL (ref 8.9–10.3)
Creatinine, Ser: 1.53 mg/dL — ABNORMAL HIGH (ref 0.44–1.00)
GFR calc Af Amer: 36 mL/min — ABNORMAL LOW (ref >60)
GFR calc non Af Amer: 31 mL/min — ABNORMAL LOW (ref >60)
GLUCOSE: 97 mg/dL (ref 65–99)
POTASSIUM: 4 mmol/L (ref 3.5–5.1)
SODIUM: 138 mmol/L (ref 135–145)
Total Protein: 7 g/dL (ref 6.5–8.1)

## 2016-12-06 MED ORDER — SODIUM CHLORIDE 0.9 % IV SOLN
Freq: Once | INTRAVENOUS | Status: AC
  Administered 2016-12-06: 10:00:00 via INTRAVENOUS
  Filled 2016-12-06: qty 1000

## 2016-12-06 MED ORDER — DIPHENHYDRAMINE HCL 50 MG/ML IJ SOLN
50.0000 mg | Freq: Once | INTRAMUSCULAR | Status: AC
  Administered 2016-12-06: 50 mg via INTRAVENOUS
  Filled 2016-12-06: qty 1

## 2016-12-06 MED ORDER — FAMOTIDINE IN NACL 20-0.9 MG/50ML-% IV SOLN
20.0000 mg | Freq: Once | INTRAVENOUS | Status: AC
Start: 2016-12-06 — End: 2016-12-06
  Administered 2016-12-06: 20 mg via INTRAVENOUS
  Filled 2016-12-06: qty 50

## 2016-12-06 MED ORDER — SODIUM CHLORIDE 0.9 % IV SOLN
1000.0000 mg | Freq: Once | INTRAVENOUS | Status: AC
  Administered 2016-12-06: 1000 mg via INTRAVENOUS
  Filled 2016-12-06: qty 40

## 2016-12-06 MED ORDER — SODIUM CHLORIDE 0.9 % IV SOLN
20.0000 mg | Freq: Once | INTRAVENOUS | Status: AC
  Administered 2016-12-06: 20 mg via INTRAVENOUS
  Filled 2016-12-06: qty 2

## 2016-12-06 MED ORDER — ACETAMINOPHEN 325 MG PO TABS
650.0000 mg | ORAL_TABLET | Freq: Once | ORAL | Status: AC
  Administered 2016-12-06: 650 mg via ORAL
  Filled 2016-12-06: qty 2

## 2016-12-06 NOTE — Assessment & Plan Note (Addendum)
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 CT SEP 27th 2018-worsening axillary; mediastinal; retroperitoneal pelvic lymphadenopathy. Platelet count 90s. Patient currently on Gazyva status post cycle 1 cycle.Tolerated well without any major infusion reactions.  # Proceed with Gazyva infusion with treatment #2 today. Labs today reviewed;  acceptable for treatment today- except for- [see below]-elevated creatinine at 1.5.  #Elevated creatinine-baseline 1.2; today 1.5.  Recommend increasing fluid intake.  STOP bactraim; STOP Lisinopril-HCTXZ. Keep log of blood pressure if elevated to let us know.  # CKD- creatinine- 1.53/worse; see above.  Not suspicious for tumor lysis syndrome.  # Thrombocytopenia- 120s asymptomatic; on Xarelto no bleeding.  # follow up in 4 weeks/labs/infusion.

## 2016-12-06 NOTE — Progress Notes (Signed)
[place] OFFICE PROGRESS NOTE  Patient Care Team: [name], MD as PCP - General (Unknown Physician Specialty) Bary Castilla, Forest Gleason, MD (General Surgery) Forest Gleason, MD (Oncology) Corey Skains, MD as Consulting Physician (Cardiology) Anell Barr, OD as Consulting Physician (Optometry)  Cancer Staging No matching staging information was found for the patient.    Oncology History   1. Lymphoma low-grade, status Rituxan therapy. 2. Recurrent disease by clinical examination in December of 2012 3. Ovarian mass on PET scan in December of 2012 (normal CA 125). Ovarian mass has resolved after rituximab therapy. 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  Patient had total 5 cycles of chemotherapy with bendamustine and rituximab and 6 cycle was omitted because of significant side effects with and weakness and fatigue.  # OCT 2016- PET significant response; ON surveillance  # 18th OCT 2018- Gazyva;SEP 2018-/OCT 2018 CT scan- progression.   # 11/06/2016- colonoscopy [Dr.Byrnett- 2 polyps]  -------------------------------------------------------------------    # left ? Adnexal mass- ? Etiology; mild SUV uptake incidental [stable compared to previous PET in 2016] Previous workup 2015- vaginal ultrasound negative. MRI February 2018- approximately 3 cm in size; slow increase in the size of the left adnexal mass over at least 3 years- suspect involvement of lymphoma rather than a primary gynecologic malignancy.     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  female pleasant patient above history of Recurrent B-cell lymphoma Small cell type- currently status post cycle 1 of  Gazyva- day # 15 approximately 2 week ago.  Patient denies any fevers or chills. Denies any significant improvement of the lumps under the arm. No diarrhea or constipation.   REVIEW OF SYSTEMS:  A complete 10 point review of system is done which is negative except mentioned above/history of present illness.   PAST MEDICAL HISTORY :  Past Medical History:  Diagnosis Date  . A-fib ([place])   . Benign neoplasm of rectum and anal canal   . 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 Hypercholesteremia   . Hypertension   . Hyperthyroidism   . Lymphoma ([place]) 2007  . Non Hodgkin's lymphoma ([place]) 01/22/2006  . Personal history of chemotherapy     PAST SURGICAL HISTORY :   Past Surgical History:  Procedure Laterality Date  . ABDOMINAL HYSTERECTOMY  1958  . APPENDECTOMY    . BREAST BIOPSY Right 1970   right breast biopsy with clip- neg  . BREAST BIOPSY Left 2002   left breast biopsy with clip-neg  . BREAST BIOPSY Right 2014   right breast biopsy with clip-neg  . BREAST CYST ASPIRATION Bilateral 2000   bilateral fine needle aspiration  . BREAST SURGERY Left 1990   biopsy  . BREAST SURGERY Right May 08, 2012   complex fibroadenoma without malignancy.  [name] CARDIAC CATHETERIZATION  2014  . COLONOSCOPY  2009, 2012    hyperplastic rectal polyp 2012 tubular adenoma of proximal ascending colon 2000 9 repeat exam due to 2017.  [name] COLONOSCOPY WITH PROPOFOL N/A 11/06/2016   Performed by Robert Bellow, MD at [facility]  . CORONARY ARTERY BYPASS GRAFT  1999  . [place]  FAMILY HISTORY :   Family History  Problem Relation Age of Onset  . Lung cancer Father        Died age 29  . Heart disease Mother        Died age 47  . Asthma Mother   . CAD Brother        double bypass  . Heart attack Sister   . Tuberculosis Sister        60 years old  . Lung cancer Sister        Died in her 31s  . Angina Sister   .  Breast cancer Sister 58  . Rheum arthritis Sister   . Aneurysm Sister        Brain, died age 40  . Angina Sister   . Heart disease Brother        Stent placement  . Heart disease Brother        stent placement  . Cancer Other        breast cancer niece, pancreatic cancer niece  . Breast cancer Other   . Ovarian cancer Neg Hx   . Diabetes Neg Hx     SOCIAL HISTORY:   Social History   Tobacco Use  . Smoking status: Never Smoker  . Smokeless tobacco: Never Used  Substance Use Topics  . Alcohol use: Yes    Alcohol/week: 1.2 - 1.8 oz    Types: 1 - 2 Glasses of wine, 1 Shots of liquor per week    Comment: daily-   . Drug use: No    ALLERGIES:  has No Known Allergies.  MEDICATIONS:  Current Outpatient Medications  Medication Sig Dispense Refill  . acyclovir (ZOVIRAX) 400 MG tablet Take 1 tablet (400 mg total) by mouth daily. 30 tablet 5  . dexamethasone (DECADRON) 4 MG tablet Take  1 tablet daily Start 2 days prior to infusion; Take for 2 days each cycle Do not take on the day of infusion. 60 tablet 3  . levothyroxine (SYNTHROID, LEVOTHROID) 88 MCG tablet TAKE 1 TABLET BY MOUTH  DAILY BEFORE BREAKFAST 90 tablet 3  . lisinopril-hydrochlorothiazide (PRINZIDE,ZESTORETIC) 20-12.5 MG tablet TAKE 1 TABLET BY MOUTH  DAILY 90 tablet 3  . Melatonin 1 MG CAPS Take 1 capsule by mouth at bedtime.     . metoprolol succinate (TOPROL-XL) 25 MG 24 hr tablet Take 25 mg by mouth daily.     . montelukast (SINGULAIR) 10 MG tablet Take 1 tablet (10 mg total) by mouth at bedtime. Start 2 days prior to infusion. Take it for 4 days. 60 tablet 0  . oxybutynin (DITROPAN) 5 MG tablet Take 5 mg daily by mouth.     . sulfamethoxazole-trimethoprim (BACTRIM DS,SEPTRA DS) 800-160 MG tablet Take 1 tablet by mouth 3 (three) times a week. 12 tablet 5  . XARELTO 20 MG TABS tablet Take 20 mg by mouth daily with supper.     . ezetimibe (ZETIA) 10 MG tablet Take 1 tablet (10 mg total) daily by mouth. (Patient not  taking: Reported on 12/06/2016) 30 tablet 12   No current facility-administered medications for this visit.     PHYSICAL EXAMINATION: ECOG PERFORMANCE STATUS: 0 - Asymptomatic  BP 102/60 Comment: manual bp-left arm  Pulse 62   Temp 97.8 F (36.6 C) (Tympanic)   Ht 5\' 5"  (1.651 m)   Wt 152 lb (68.9 kg)   BMI 25.29 kg/m   Filed Weights   12/06/16 0904  Weight: 152 lb (68.9 kg)  GENERAL: Well-nourished well-developed; Alert, no distress and comfortable.  Accompanied by her husband. EYES: no pallor or icterus OROPHARYNX: no thrush or ulceration; good dentition  NECK: supple, no masses felt LYMPH: Bil rubbery 1-2 cm palpable lymphadenopathy in the axillary region; 1 cm right level II  LUNGS: clear to auscultation and  No wheeze or crackles HEART/CVS: regular rate & rhythm and no murmurs; No lower extremity edema ABDOMEN:abdomen soft, non-tender and normal bowel sounds Musculoskeletal:no cyanosis of digits and no clubbing  PSYCH: alert & oriented x 3 with fluent speech NEURO: no focal motor/sensory deficits SKIN:  no rashes or significant lesions  LABORATORY DATA:  I have reviewed the data as listed    Component Value Date/Time   NA 138 12/06/2016 0830   NA 144 02/13/2016 1412   NA 141 05/11/2014 0853   K 4.0 12/06/2016 0830   K 3.6 05/11/2014 0853   CL 104 12/06/2016 0830   CL 103 05/11/2014 0853   CO2 26 12/06/2016 0830   CO2 29 05/11/2014 0853   GLUCOSE 97 12/06/2016 0830   GLUCOSE 107 (H) 05/11/2014 0853   BUN 35 (H) 12/06/2016 0830   BUN 24 02/13/2016 1412   BUN 22 (H) 05/11/2014 0853   CREATININE 1.53 (H) 12/06/2016 0830   CREATININE 1.07 (H) 05/11/2014 0853   CALCIUM 10.3 12/06/2016 0830   CALCIUM 10.1 05/11/2014 0853   PROT 7.0 12/06/2016 0830   PROT 6.6 02/13/2016 1412   PROT 7.3 05/11/2014 0853   ALBUMIN 4.1 12/06/2016 0830   ALBUMIN 4.2 02/13/2016 1412   ALBUMIN 4.7 05/11/2014 0853   AST 28 12/06/2016 0830   AST 29 05/11/2014 0853   ALT 24  12/06/2016 0830   ALT 20 05/11/2014 0853   ALKPHOS 54 12/06/2016 0830   ALKPHOS 47 05/11/2014 0853   BILITOT 0.7 12/06/2016 0830   BILITOT 0.6 02/13/2016 1412   BILITOT 1.0 05/11/2014 0853   GFRNONAA 31 (L) 12/06/2016 0830   GFRNONAA 51 (L) 05/11/2014 0853   GFRAA 36 (L) 12/06/2016 0830   GFRAA 59 (L) 05/11/2014 0853    No results found for: SPEP, UPEP  Lab Results  Component Value Date   WBC 5.2 12/06/2016   NEUTROABS 2.9 12/06/2016   HGB 13.2 12/06/2016   HCT 39.4 12/06/2016   MCV 89.0 12/06/2016   PLT 105 (L) 12/06/2016      Chemistry      Component Value Date/Time   NA 138 12/06/2016 0830   NA 144 02/13/2016 1412   NA 141 05/11/2014 0853   K 4.0 12/06/2016 0830   K 3.6 05/11/2014 0853   CL 104 12/06/2016 0830   CL 103 05/11/2014 0853   CO2 26 12/06/2016 0830   CO2 29 05/11/2014 0853   BUN 35 (H) 12/06/2016 0830   BUN 24 02/13/2016 1412   BUN 22 (H) 05/11/2014 0853   CREATININE 1.53 (H) 12/06/2016 0830   CREATININE 1.07 (H) 05/11/2014 0853   GLU 98 05/25/2013      Component Value Date/Time   CALCIUM 10.3 12/06/2016 0830   CALCIUM 10.1 05/11/2014 0853   ALKPHOS 54 12/06/2016 0830   ALKPHOS 47 05/11/2014 0853   AST 28 12/06/2016 0830   AST 29 05/11/2014 0853   ALT 24 12/06/2016 0830   ALT 20 05/11/2014 0853   BILITOT 0.7 12/06/2016 0830   BILITOT 0.6 02/13/2016 1412   BILITOT 1.0 05/11/2014 0853     : 1. No evidence of persistent small bowel intussusception. No definite small bowel  neoplasm identified on enterography images. 2. Worsening lymphadenopathy throughout the abdomen and pelvis, concerning for recurrent lymphoma. 3. Circumferential thickening of the distal rectum with some borderline enlarged mesorectal lymph nodes. These findings are nonspecific, but correlation with sigmoidoscopy or colonoscopy should be considered if there is any clinical concern for underlying colorectal neoplasm. 4. Left adnexal mass measuring 3.3 x 4.2 x 3.8 cm,  similar to prior examinations. 5. Cardiomegaly with biatrial dilatation. 6. Aortic atherosclerosis, in addition to least 2 vessel coronary artery disease. Assessment for potential risk factor modification, dietary therapy or pharmacologic therapy may be warranted, if clinically indicated. 7. Additional incidental findings, as above.  Aortic Atherosclerosis (ICD10-I70.0).   Electronically Signed   By: Vinnie Langton M.D.   On: 10/22/2016 12:21   RADIOGRAPHIC STUDIES: I have personally reviewed the radiological images as listed and agreed with the findings in the report. No results found.   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 CT SEP 27th 2018-worsening axillary; mediastinal; retroperitoneal pelvic lymphadenopathy. Platelet count 90s. Patient currently on Gazyva status post cycle 1 cycle.Tolerated well without any major infusion reactions.  # Proceed with Gazyva infusion with treatment #2 today. Labs today reviewed;  acceptable for treatment today- except for- [see below]-elevated creatinine at 1.5.  #Elevated creatinine-baseline 1.2; today 1.5.  Recommend increasing fluid intake.  STOP bactraim; STOP Lisinopril-HCTXZ. Keep log of blood pressure if elevated to let us know.  # CKD- creatinine- 1.53/worse; see above.   # Thrombocytopenia- 120s asymptomatic; on Xarelto no bleeding.  # follow up in 4 weeks/labs/infusion.    Orders Placed This Encounter  Procedures  . CBC with Differential/Platelet    Standing Status:   Future    Standing Expiration Date:   12/06/2017  . Comprehensive metabolic panel    Standing Status:   Future    Standing Expiration Date:   12/06/2017   All questions were answered. The patient knows to call the clinic with any problems, questions or concerns.      Cammie Sickle,  MD 12/07/2016 9:35 PM

## 2016-12-06 NOTE — Progress Notes (Signed)
Patient here for f/u for lymphoma and gazava treatment.  She states that her left axillary area is sore. She denies any trauma to axilla.

## 2016-12-17 ENCOUNTER — Ambulatory Visit
Admission: RE | Admit: 2016-12-17 | Discharge: 2016-12-17 | Disposition: A | Discharge status: Home / Self Care | Payer: Medicare Other | Source: Ambulatory Visit | Attending: Family Medicine | Admitting: Family Medicine

## 2016-12-17 DIAGNOSIS — R590 Localized enlarged lymph nodes: Secondary | ICD-10-CM | POA: Diagnosis not present

## 2016-12-17 DIAGNOSIS — Z1231 Encounter for screening mammogram for malignant neoplasm of breast: Secondary | ICD-10-CM | POA: Insufficient documentation

## 2016-12-18 ENCOUNTER — Other Ambulatory Visit: Payer: Self-pay | Admitting: Family Medicine

## 2016-12-18 DIAGNOSIS — R928 Other abnormal and inconclusive findings on diagnostic imaging of breast: Secondary | ICD-10-CM

## 2017-01-03 ENCOUNTER — Telehealth: Payer: Self-pay | Admitting: Internal Medicine

## 2017-01-03 ENCOUNTER — Other Ambulatory Visit: Payer: Self-pay | Admitting: Internal Medicine

## 2017-01-03 ENCOUNTER — Inpatient Hospital Stay: Payer: Medicare Other

## 2017-01-03 ENCOUNTER — Ambulatory Visit
Admission: RE | Admit: 2017-01-03 | Discharge: 2017-01-03 | Disposition: A | Discharge status: Home / Self Care | Payer: Medicare Other | Source: Ambulatory Visit | Attending: Family Medicine | Admitting: Family Medicine

## 2017-01-03 ENCOUNTER — Inpatient Hospital Stay (HOSPITAL_BASED_OUTPATIENT_CLINIC_OR_DEPARTMENT_OTHER): Payer: Medicare Other | Admitting: Internal Medicine

## 2017-01-03 ENCOUNTER — Inpatient Hospital Stay: Payer: Medicare Other | Attending: Internal Medicine

## 2017-01-03 ENCOUNTER — Ambulatory Visit
Admission: RE | Admit: 2017-01-03 | Discharge: 2017-01-03 | Disposition: A | Discharge status: Home / Self Care | Payer: Medicare Other | Source: Ambulatory Visit | Attending: Internal Medicine | Admitting: Internal Medicine

## 2017-01-03 VITALS — BP 125/86 | Temp 98.2°F | Resp 16 | Wt 158.2 lb

## 2017-01-03 DIAGNOSIS — N281 Cyst of kidney, acquired: Secondary | ICD-10-CM | POA: Insufficient documentation

## 2017-01-03 DIAGNOSIS — R599 Enlarged lymph nodes, unspecified: Secondary | ICD-10-CM | POA: Insufficient documentation

## 2017-01-03 DIAGNOSIS — R590 Localized enlarged lymph nodes: Secondary | ICD-10-CM | POA: Insufficient documentation

## 2017-01-03 DIAGNOSIS — C8308 Small cell B-cell lymphoma, lymph nodes of multiple sites: Secondary | ICD-10-CM | POA: Diagnosis not present

## 2017-01-03 DIAGNOSIS — R140 Abdominal distension (gaseous): Secondary | ICD-10-CM | POA: Diagnosis not present

## 2017-01-03 DIAGNOSIS — D696 Thrombocytopenia, unspecified: Secondary | ICD-10-CM | POA: Insufficient documentation

## 2017-01-03 DIAGNOSIS — M7989 Other specified soft tissue disorders: Secondary | ICD-10-CM | POA: Insufficient documentation

## 2017-01-03 DIAGNOSIS — Z90710 Acquired absence of both cervix and uterus: Secondary | ICD-10-CM | POA: Insufficient documentation

## 2017-01-03 DIAGNOSIS — R635 Abnormal weight gain: Secondary | ICD-10-CM

## 2017-01-03 DIAGNOSIS — I517 Cardiomegaly: Secondary | ICD-10-CM

## 2017-01-03 DIAGNOSIS — E0590 Thyrotoxicosis, unspecified without thyrotoxic crisis or storm: Secondary | ICD-10-CM

## 2017-01-03 DIAGNOSIS — R1900 Intra-abdominal and pelvic swelling, mass and lump, unspecified site: Secondary | ICD-10-CM | POA: Diagnosis not present

## 2017-01-03 DIAGNOSIS — Z9221 Personal history of antineoplastic chemotherapy: Secondary | ICD-10-CM

## 2017-01-03 DIAGNOSIS — N6489 Other specified disorders of breast: Secondary | ICD-10-CM | POA: Diagnosis not present

## 2017-01-03 DIAGNOSIS — E7800 Pure hypercholesterolemia, unspecified: Secondary | ICD-10-CM

## 2017-01-03 DIAGNOSIS — I700 Atherosclerosis of aorta: Secondary | ICD-10-CM | POA: Insufficient documentation

## 2017-01-03 DIAGNOSIS — I4891 Unspecified atrial fibrillation: Secondary | ICD-10-CM | POA: Diagnosis not present

## 2017-01-03 DIAGNOSIS — Z79899 Other long term (current) drug therapy: Secondary | ICD-10-CM

## 2017-01-03 DIAGNOSIS — I10 Essential (primary) hypertension: Secondary | ICD-10-CM | POA: Insufficient documentation

## 2017-01-03 DIAGNOSIS — R928 Other abnormal and inconclusive findings on diagnostic imaging of breast: Secondary | ICD-10-CM | POA: Diagnosis not present

## 2017-01-03 DIAGNOSIS — Z7901 Long term (current) use of anticoagulants: Secondary | ICD-10-CM

## 2017-01-03 DIAGNOSIS — K7689 Other specified diseases of liver: Secondary | ICD-10-CM | POA: Insufficient documentation

## 2017-01-03 LAB — COMPREHENSIVE METABOLIC PANEL
ALT: 27 U/L (ref 14–54)
ANION GAP: 6 (ref 5–15)
AST: 34 U/L (ref 15–41)
Albumin: 3.8 g/dL (ref 3.5–5.0)
Alkaline Phosphatase: 72 U/L (ref 38–126)
BUN: 30 mg/dL — ABNORMAL HIGH (ref 6–20)
CHLORIDE: 106 mmol/L (ref 101–111)
CO2: 26 mmol/L (ref 22–32)
CREATININE: 1.2 mg/dL — AB (ref 0.44–1.00)
Calcium: 9.9 mg/dL (ref 8.9–10.3)
GFR, EST AFRICAN AMERICAN: 49 mL/min — AB (ref >60)
GFR, EST NON AFRICAN AMERICAN: 42 mL/min — AB (ref >60)
Glucose, Bld: 105 mg/dL — ABNORMAL HIGH (ref 65–99)
POTASSIUM: 3.9 mmol/L (ref 3.5–5.1)
SODIUM: 138 mmol/L (ref 135–145)
Total Bilirubin: 0.6 mg/dL (ref 0.3–1.2)
Total Protein: 6.6 g/dL (ref 6.5–8.1)

## 2017-01-03 LAB — CBC WITH DIFFERENTIAL/PLATELET
Basophils Absolute: 0.1 10*3/uL (ref 0–0.1)
Basophils Relative: 2 %
EOS ABS: 0.1 10*3/uL (ref 0–0.7)
Eosinophils Relative: 3 %
HEMATOCRIT: 37.1 % (ref 35.0–47.0)
HEMOGLOBIN: 12.3 g/dL (ref 12.0–16.0)
LYMPHS ABS: 0.9 10*3/uL — AB (ref 1.0–3.6)
Lymphocytes Relative: 21 %
MCH: 29.2 pg (ref 26.0–34.0)
MCHC: 33.3 g/dL (ref 32.0–36.0)
MCV: 87.8 fL (ref 80.0–100.0)
MONOS PCT: 13 %
Monocytes Absolute: 0.6 10*3/uL (ref 0.2–0.9)
NEUTROS ABS: 2.8 10*3/uL (ref 1.4–6.5)
NEUTROS PCT: 61 %
Platelets: 108 10*3/uL — ABNORMAL LOW (ref 150–440)
RBC: 4.23 MIL/uL (ref 3.80–5.20)
RDW: 15.4 % — ABNORMAL HIGH (ref 11.5–14.5)
WBC: 4.5 10*3/uL (ref 3.6–11.0)

## 2017-01-03 MED ORDER — FUROSEMIDE 20 MG PO TABS: 20.0000 mg | ORAL_TABLET | Freq: Once | ORAL | 0 refills | Status: DC

## 2017-01-03 NOTE — Telephone Encounter (Signed)
Please schedule lab/MD/Gazyva 1 month (cbc, cmp). Thank you!

## 2017-01-03 NOTE — Progress Notes (Signed)
[facility] OFFICE PROGRESS NOTE  Patient Care Team: [name], MD as PCP - General (Unknown Physician Specialty) Bary Castilla, Forest Gleason, MD (General Surgery) Forest Gleason, MD (Oncology) Corey Skains, MD as Consulting Physician (Cardiology) Anell Barr, OD as Consulting Physician (Optometry)  Cancer Staging No matching staging information was found for the patient.    Oncology History   1. Lymphoma low-grade, status Rituxan therapy. 2. Recurrent disease by clinical examination in December of 2012 3. Ovarian mass on PET scan in December of 2012 (normal CA 125). Ovarian mass has resolved after rituximab therapy. 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  Patient had total 5 cycles of chemotherapy with bendamustine and rituximab and 6 cycle was omitted because of significant side effects with and weakness and fatigue.  # OCT 2016- PET significant response; ON surveillance  # 18th OCT 2018- Gazyva;SEP 2018-/OCT 2018 CT scan- progression.   # 11/06/2016- colonoscopy [Dr.Byrnett- 2 polyps]  -------------------------------------------------------------------    # left ? Adnexal mass- ? Etiology; mild SUV uptake incidental [stable compared to previous PET in 2016] Previous workup 2015- vaginal ultrasound negative. MRI February 2018- approximately 3 cm in size; slow increase in the size of the left adnexal mass over at least 3 years- suspect involvement of lymphoma rather than a primary gynecologic malignancy.     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  female pleasant patient above history of Recurrent B-cell lymphoma Small cell type- currently status post cycle 2 of  Gazyva approximately 4 weeks ago.  Noted to have abdominal distention over the last 2-3 weeks.  Also noted to have worsening swelling in the legs; she denies any significant cramping in the legs.  She has gained about 7-8 pounds.  Her appetite is fair.  The patient was taken off of her lisinopril hydrochlorothiazide because of slightly worsened kidney function approximately 4 weeks ago [creatinine baseline 1.2 to 1.5.].  No new shortness of breath or cough.  Patient took dexamethasone 1 mg twice daily starting yesterday; in preparation for treatment today.  Patient denies any fevers or chills. Denies any significant improvement of the lumps under the arm. No diarrhea or constipation.   REVIEW OF SYSTEMS:  A complete 10 point review of system is done which is negative except mentioned above/history of present illness.   PAST MEDICAL HISTORY :  Past Medical History:  Diagnosis Date  . A-fib ([place])   . Benign neoplasm of rectum and anal canal   . 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 Hypercholesteremia   . Hypertension   . Hyperthyroidism   . Lymphoma ([place]) 2007  . Non Hodgkin's lymphoma ([place]) 01/22/2006  . Personal history of chemotherapy     PAST SURGICAL HISTORY :   Past Surgical History:  Procedure Laterality Date  . ABDOMINAL HYSTERECTOMY  1958  . APPENDECTOMY    . BREAST BIOPSY Right 1970   right breast biopsy with clip- neg  . BREAST BIOPSY Left 2002   left breast biopsy with clip-neg  . BREAST BIOPSY Right 2014   right breast biopsy with clip-neg  . BREAST CYST ASPIRATION Bilateral 2000   bilateral fine needle aspiration  . BREAST  SURGERY Left 1990   biopsy  . BREAST SURGERY Right May 08, 2012   complex fibroadenoma without malignancy.  [name] CARDIAC CATHETERIZATION  2014  . COLONOSCOPY  2009, 2012    hyperplastic rectal polyp 2012 tubular adenoma of proximal ascending colon 2000 9 repeat exam due to 2017.  [name]  COLONOSCOPY WITH PROPOFOL N/A 11/06/2016   Procedure: COLONOSCOPY WITH PROPOFOL;  Surgeon: Robert Bellow, MD;  Location: [facility] ENDOSCOPY;  Service: Endoscopy;  Laterality: N/A;  . CORONARY ARTERY BYPASS GRAFT  1999  . FLEXIBLE SIGMOIDOSCOPY  1998    FAMILY HISTORY :   Family History  Problem Relation Age of Onset  . Lung cancer Father        Died age 36  . Heart disease Mother        Died age 99  . Asthma Mother   . CAD Brother        double bypass  . Heart attack Sister   . Tuberculosis Sister        57 years old  . Lung cancer Sister        Died in her 41s  . Angina Sister   . Breast cancer Sister 33  . Rheum arthritis Sister   . Aneurysm Sister        Brain, died age 8  . Angina Sister   . Heart disease Brother        Stent placement  . Heart disease Brother        stent placement  . Cancer Other        breast cancer niece, pancreatic cancer niece  . Breast cancer Other   . Ovarian cancer Neg Hx   . Diabetes Neg Hx     SOCIAL HISTORY:   Social History   Tobacco Use  . Smoking status: Never Smoker  . Smokeless tobacco: Never Used  Substance Use Topics  . Alcohol use: Yes    Alcohol/week: 1.2 - 1.8 oz    Types: 1 - 2 Glasses of wine, 1 Shots of liquor per week    Comment: daily-   . Drug use: No    ALLERGIES:  has No Known Allergies.  MEDICATIONS:  Current Outpatient Medications  Medication Sig Dispense Refill  . acyclovir (ZOVIRAX) 400 MG tablet Take 1 tablet (400 mg total) by mouth daily. 30 tablet 5  . dexamethasone (DECADRON) 4 MG tablet Take  1 tablet daily Start 2 days prior to infusion; Take for 2 days each cycle Do not take on the day of infusion. 60 tablet 3  . ezetimibe (ZETIA) 10 MG tablet Take 1 tablet (10 mg total) daily by mouth. 30 tablet 12  . levothyroxine (SYNTHROID, LEVOTHROID) 88 MCG tablet TAKE 1 TABLET BY MOUTH  DAILY BEFORE BREAKFAST 90 tablet 3  . lisinopril-hydrochlorothiazide (PRINZIDE,ZESTORETIC) 20-12.5 MG tablet TAKE 1  TABLET BY MOUTH  DAILY 90 tablet 3  . Melatonin 1 MG CAPS Take 1 capsule by mouth at bedtime.     . metoprolol succinate (TOPROL-XL) 25 MG 24 hr tablet Take 25 mg by mouth daily.     . montelukast (SINGULAIR) 10 MG tablet Take 1 tablet (10 mg total) by mouth at bedtime. Start 2 days prior to infusion. Take it for 4 days. 60 tablet 0  . oxybutynin (DITROPAN) 5 MG tablet Take 5 mg daily by mouth.     . sulfamethoxazole-trimethoprim (BACTRIM DS,SEPTRA DS) 800-160 MG tablet Take 1 tablet by mouth 3 (three) times a week. 12 tablet  5  . XARELTO 20 MG TABS tablet Take 20 mg by mouth daily with supper.      No current facility-administered medications for this visit.     PHYSICAL EXAMINATION: ECOG PERFORMANCE STATUS: 0 - Asymptomatic  BP 125/86 (BP Location: Left Arm, Patient Position: Sitting)   Temp 98.2 F (36.8 C) (Tympanic)   Resp 16   Wt 158 lb 3.2 oz (71.8 kg)   BMI 26.33 kg/m   Filed Weights   01/03/17 0831 01/03/17 0834  Weight: 158 lb 3.2 oz (71.8 kg) 158 lb 3.2 oz (71.8 kg)    GENERAL: Well-nourished well-developed; Alert, no distress and comfortable.  Accompanied by her husband. EYES: no pallor or icterus OROPHARYNX: no thrush or ulceration; good dentition  NECK: supple, no masses felt LYMPH: Bil rubbery 1-2 cm palpable lymphadenopathy in the axillary region; 1 cm right level II  LUNGS: clear to auscultation and  No wheeze or crackles HEART/CVS: regular rate & rhythm and no murmurs; No lower extremity edema ABDOMEN:abdomen soft, non-tender and normal bowel sounds Musculoskeletal:no cyanosis of digits and no clubbing  PSYCH: alert & oriented x 3 with fluent speech NEURO: no focal motor/sensory deficits SKIN:  no rashes or significant lesions  LABORATORY DATA:  I have reviewed the data as listed    Component Value Date/Time   NA 138 01/03/2017 0759   NA 144 02/13/2016 1412   NA 141 05/11/2014 0853   K 3.9 01/03/2017 0759   K 3.6 05/11/2014 0853   CL 106 01/03/2017  0759   CL 103 05/11/2014 0853   CO2 26 01/03/2017 0759   CO2 29 05/11/2014 0853   GLUCOSE 105 (H) 01/03/2017 0759   GLUCOSE 107 (H) 05/11/2014 0853   BUN 30 (H) 01/03/2017 0759   BUN 24 02/13/2016 1412   BUN 22 (H) 05/11/2014 0853   CREATININE 1.20 (H) 01/03/2017 0759   CREATININE 1.07 (H) 05/11/2014 0853   CALCIUM 9.9 01/03/2017 0759   CALCIUM 10.1 05/11/2014 0853   PROT 6.6 01/03/2017 0759   PROT 6.6 02/13/2016 1412   PROT 7.3 05/11/2014 0853   ALBUMIN 3.8 01/03/2017 0759   ALBUMIN 4.2 02/13/2016 1412   ALBUMIN 4.7 05/11/2014 0853   AST 34 01/03/2017 0759   AST 29 05/11/2014 0853   ALT 27 01/03/2017 0759   ALT 20 05/11/2014 0853   ALKPHOS 72 01/03/2017 0759   ALKPHOS 47 05/11/2014 0853   BILITOT 0.6 01/03/2017 0759   BILITOT 0.6 02/13/2016 1412   BILITOT 1.0 05/11/2014 0853   GFRNONAA 42 (L) 01/03/2017 0759   GFRNONAA 51 (L) 05/11/2014 0853   GFRAA 49 (L) 01/03/2017 0759   GFRAA 59 (L) 05/11/2014 0853    No results found for: SPEP, UPEP  Lab Results  Component Value Date   WBC 4.5 01/03/2017   NEUTROABS 2.8 01/03/2017   HGB 12.3 01/03/2017   HCT 37.1 01/03/2017   MCV 87.8 01/03/2017   PLT 108 (L) 01/03/2017      Chemistry      Component Value Date/Time   NA 138 01/03/2017 0759   NA 144 02/13/2016 1412   NA 141 05/11/2014 0853   K 3.9 01/03/2017 0759   K 3.6 05/11/2014 0853   CL 106 01/03/2017 0759   CL 103 05/11/2014 0853   CO2 26 01/03/2017 0759   CO2 29 05/11/2014 0853   BUN 30 (H) 01/03/2017 0759   BUN 24 02/13/2016 1412   BUN 22 (H) 05/11/2014 0853   CREATININE 1.20 (H) 01/03/2017  0759   CREATININE 1.07 (H) 05/11/2014 0853   GLU 98 05/25/2013      Component Value Date/Time   CALCIUM 9.9 01/03/2017 0759   CALCIUM 10.1 05/11/2014 0853   ALKPHOS 72 01/03/2017 0759   ALKPHOS 47 05/11/2014 0853   AST 34 01/03/2017 0759   AST 29 05/11/2014 0853   ALT 27 01/03/2017 0759   ALT 20 05/11/2014 0853   BILITOT 0.6 01/03/2017 0759   BILITOT 0.6  02/13/2016 1412   BILITOT 1.0 05/11/2014 0853     : 1. No evidence of persistent small bowel intussusception. No definite small bowel neoplasm identified on enterography images. 2. Worsening lymphadenopathy throughout the abdomen and pelvis, concerning for recurrent lymphoma. 3. Circumferential thickening of the distal rectum with some borderline enlarged mesorectal lymph nodes. These findings are nonspecific, but correlation with sigmoidoscopy or colonoscopy should be considered if there is any clinical concern for underlying colorectal neoplasm. 4. Left adnexal mass measuring 3.3 x 4.2 x 3.8 cm, similar to prior examinations. 5. Cardiomegaly with biatrial dilatation. 6. Aortic atherosclerosis, in addition to least 2 vessel coronary artery disease. Assessment for potential risk factor modification, dietary therapy or pharmacologic therapy may be warranted, if clinically indicated. 7. Additional incidental findings, as above.  Aortic Atherosclerosis (ICD10-I70.0).   Electronically Signed   By: Vinnie Langton M.D.   On: 10/22/2016 12:21   RADIOGRAPHIC STUDIES: I have personally reviewed the radiological images as listed and agreed with the findings in the report. No results found.   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 Platelet count 100s.  # Patient currently on Gazyva status post cycle 2 cycle.Tolerated well except for fluid retention/weight gain [see discussion below]  # HOLD Gazyva infusion with treatment- see discussion below.  CBC within normal limits except for mild thrombocytopenia.  #Abdominal distention worsening- fluid retention/swelling in the legs; creatinine- 1.2/improved.  Recommend stat ultrasound of the abdomen.   # BIl LE swelling- clinically not  DVT [pt on xarelto]; await for above work up.   # Thrombocytopenia- 100s asymptomatic; on Xarelto no bleeding.  # follow up to decided; cell-[phone]. Add TSH.    Orders Placed This Encounter  Procedures  . US Abdomen Complete    HOLD PT    Standing Status:   Future    Number of Occurrences:   1    Standing Expiration Date:   01/03/2018    Order Specific Question:   Reason for Exam (SYMPTOM  OR DIAGNOSIS REQUIRED)    Answer:   abdominal distension; bil leg swelling; weight gain    Order Specific Question:   Preferred imaging location?    Answer:   [facility]    Order Specific Question:   Call Results- Best Contact Number?    Answer:   [phone]  . Urinalysis, Complete w Microscopic   All questions were answered. The patient knows to call the clinic with any problems, questions or concerns.      Cammie Sickle, MD 01/03/2017 1:45 PM

## 2017-01-03 NOTE — Telephone Encounter (Signed)
FYI-  # Ultrasound of the abdomen negative for acute process; pending final results.  #Recommend Lasix 20 mg once a day x7; restart lisinopril hydrochlorothiazide; check weight daily ~baseline 148.  Call us with an update in 1 week-regarding swelling/weight.  #Follow-up in 1 month/labs infusion again.

## 2017-01-03 NOTE — Assessment & Plan Note (Addendum)
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 Platelet count 100s.  # Patient currently on Gazyva status post cycle 2 cycle.Tolerated well except for fluid retention/weight gain [see discussion below]  # HOLD Gazyva infusion with treatment- see discussion below.  CBC within normal limits except for mild thrombocytopenia.  #Abdominal distention worsening- fluid retention/swelling in the legs; creatinine- 1.2/improved.  Recommend stat ultrasound of the abdomen.   # BIl LE swelling- clinically not DVT [pt on xarelto]; await for above work up.   # Thrombocytopenia- 100s asymptomatic; on Xarelto no bleeding.  # follow up to decided; [phone]. Add TSH.

## 2017-01-03 NOTE — Telephone Encounter (Signed)
Orders have been placed.

## 2017-01-03 NOTE — Progress Notes (Signed)
Patient is here today for a follow up. Patient states she has increased swelling in her abdomen and bilateral lower extremities.

## 2017-01-11 ENCOUNTER — Telehealth: Payer: Self-pay | Admitting: *Deleted

## 2017-01-11 NOTE — Telephone Encounter (Signed)
Noted  

## 2017-01-11 NOTE — Telephone Encounter (Signed)
Patient called to report that 1 week after starting Lasix and Lisinopril her swelling is decreased and she feels better. It is not completely gone, but it is much better.

## 2017-01-24 ENCOUNTER — Other Ambulatory Visit: Payer: Self-pay | Admitting: Family Medicine

## 2017-02-07 ENCOUNTER — Inpatient Hospital Stay (HOSPITAL_BASED_OUTPATIENT_CLINIC_OR_DEPARTMENT_OTHER): Payer: Medicare Other | Admitting: Internal Medicine

## 2017-02-07 ENCOUNTER — Encounter: Payer: Self-pay | Admitting: Internal Medicine

## 2017-02-07 ENCOUNTER — Inpatient Hospital Stay: Payer: Medicare Other

## 2017-02-07 ENCOUNTER — Inpatient Hospital Stay: Payer: Medicare Other | Attending: Internal Medicine

## 2017-02-07 VITALS — BP 122/74 | HR 82 | Temp 97.1°F | Wt 152.2 lb

## 2017-02-07 DIAGNOSIS — I25810 Atherosclerosis of coronary artery bypass graft(s) without angina pectoris: Secondary | ICD-10-CM | POA: Diagnosis not present

## 2017-02-07 DIAGNOSIS — R609 Edema, unspecified: Secondary | ICD-10-CM | POA: Diagnosis not present

## 2017-02-07 DIAGNOSIS — R079 Chest pain, unspecified: Secondary | ICD-10-CM | POA: Diagnosis not present

## 2017-02-07 DIAGNOSIS — I340 Nonrheumatic mitral (valve) insufficiency: Secondary | ICD-10-CM | POA: Diagnosis not present

## 2017-02-07 DIAGNOSIS — C8308 Small cell B-cell lymphoma, lymph nodes of multiple sites: Secondary | ICD-10-CM

## 2017-02-07 DIAGNOSIS — D696 Thrombocytopenia, unspecified: Secondary | ICD-10-CM | POA: Diagnosis not present

## 2017-02-07 DIAGNOSIS — Z5112 Encounter for antineoplastic immunotherapy: Secondary | ICD-10-CM | POA: Diagnosis not present

## 2017-02-07 DIAGNOSIS — E782 Mixed hyperlipidemia: Secondary | ICD-10-CM | POA: Diagnosis not present

## 2017-02-07 DIAGNOSIS — I482 Chronic atrial fibrillation: Secondary | ICD-10-CM | POA: Diagnosis not present

## 2017-02-07 DIAGNOSIS — I10 Essential (primary) hypertension: Secondary | ICD-10-CM | POA: Diagnosis not present

## 2017-02-07 LAB — COMPREHENSIVE METABOLIC PANEL
ALK PHOS: 70 U/L (ref 38–126)
ALT: 22 U/L (ref 14–54)
AST: 32 U/L (ref 15–41)
Albumin: 4.1 g/dL (ref 3.5–5.0)
Anion gap: 7 (ref 5–15)
BUN: 29 mg/dL — AB (ref 6–20)
CALCIUM: 10.1 mg/dL (ref 8.9–10.3)
CHLORIDE: 105 mmol/L (ref 101–111)
CO2: 29 mmol/L (ref 22–32)
CREATININE: 1.23 mg/dL — AB (ref 0.44–1.00)
GFR calc Af Amer: 47 mL/min — ABNORMAL LOW (ref >60)
GFR calc non Af Amer: 41 mL/min — ABNORMAL LOW (ref >60)
GLUCOSE: 96 mg/dL (ref 65–99)
Potassium: 4.2 mmol/L (ref 3.5–5.1)
SODIUM: 141 mmol/L (ref 135–145)
Total Bilirubin: 0.5 mg/dL (ref 0.3–1.2)
Total Protein: 7 g/dL (ref 6.5–8.1)

## 2017-02-07 LAB — CBC WITH DIFFERENTIAL/PLATELET
BASOS PCT: 1 %
Basophils Absolute: 0.1 10*3/uL (ref 0–0.1)
EOS ABS: 0.1 10*3/uL (ref 0–0.7)
EOS PCT: 2 %
HCT: 39.4 % (ref 35.0–47.0)
Hemoglobin: 13.2 g/dL (ref 12.0–16.0)
LYMPHS ABS: 1.3 10*3/uL (ref 1.0–3.6)
Lymphocytes Relative: 28 %
MCH: 28.8 pg (ref 26.0–34.0)
MCHC: 33.6 g/dL (ref 32.0–36.0)
MCV: 85.7 fL (ref 80.0–100.0)
MONO ABS: 0.5 10*3/uL (ref 0.2–0.9)
MONOS PCT: 12 %
Neutro Abs: 2.6 10*3/uL (ref 1.4–6.5)
Neutrophils Relative %: 57 %
PLATELETS: 115 10*3/uL — AB (ref 150–440)
RBC: 4.6 MIL/uL (ref 3.80–5.20)
RDW: 15.1 % — AB (ref 11.5–14.5)
WBC: 4.6 10*3/uL (ref 3.6–11.0)

## 2017-02-07 MED ORDER — SODIUM CHLORIDE 0.9 % IV SOLN
20.0000 mg | Freq: Once | INTRAVENOUS | Status: AC
  Administered 2017-02-07: 20 mg via INTRAVENOUS
  Filled 2017-02-07: qty 2

## 2017-02-07 MED ORDER — FAMOTIDINE IN NACL 20-0.9 MG/50ML-% IV SOLN
20.0000 mg | Freq: Once | INTRAVENOUS | Status: AC
  Administered 2017-02-07: 20 mg via INTRAVENOUS
  Filled 2017-02-07: qty 50

## 2017-02-07 MED ORDER — SODIUM CHLORIDE 0.9 % IV SOLN
1000.0000 mg | Freq: Once | INTRAVENOUS | Status: AC
  Administered 2017-02-07: 1000 mg via INTRAVENOUS
  Filled 2017-02-07: qty 40

## 2017-02-07 MED ORDER — DIPHENHYDRAMINE HCL 50 MG/ML IJ SOLN
50.0000 mg | Freq: Once | INTRAMUSCULAR | Status: AC
  Administered 2017-02-07: 50 mg via INTRAVENOUS
  Filled 2017-02-07: qty 1

## 2017-02-07 MED ORDER — ACETAMINOPHEN 325 MG PO TABS
650.0000 mg | ORAL_TABLET | Freq: Once | ORAL | Status: AC
  Administered 2017-02-07: 650 mg via ORAL
  Filled 2017-02-07: qty 2

## 2017-02-07 MED ORDER — SODIUM CHLORIDE 0.9 % IV SOLN
Freq: Once | INTRAVENOUS | Status: AC
  Administered 2017-02-07: 10:00:00 via INTRAVENOUS
  Filled 2017-02-07: qty 1000

## 2017-02-07 NOTE — Progress Notes (Signed)
[facility] OFFICE PROGRESS NOTE  Patient Care Team: [name], MD as PCP - General (Unknown Physician Specialty) Bary Castilla, Forest Gleason, MD (General Surgery) Forest Gleason, MD (Oncology) Corey Skains, MD as Consulting Physician (Cardiology) Anell Barr, OD as Consulting Physician (Optometry)  Cancer Staging No matching staging information was found for the patient.    Oncology History   1. Lymphoma low-grade, status Rituxan therapy. 2. Recurrent disease by clinical examination in December of 2012 3. Ovarian mass on PET scan in December of 2012 (normal CA 125). Ovarian mass has resolved after rituximab therapy. 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  Patient had total 5 cycles of chemotherapy with bendamustine and rituximab and 6 cycle was omitted because of significant side effects with and weakness and fatigue.  # OCT 2016- PET significant response; ON surveillance  # 18th OCT 2018- Gazyva;SEP 2018-/OCT 2018 CT scan- progression.   # 11/06/2016- colonoscopy [Dr.Byrnett- 2 polyps]  -------------------------------------------------------------------    # left ? Adnexal mass- ? Etiology; mild SUV uptake incidental [stable compared to previous PET in 2016] Previous workup 2015- vaginal ultrasound negative. MRI February 2018- approximately 3 cm in size; slow increase in the size of the left adnexal mass over at least 3 years- suspect involvement of lymphoma rather than a primary gynecologic malignancy.     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  female pleasant patient above history of Recurrent B-cell lymphoma Small cell type- currently status post cycle 2 of  Gazyva approximately 2 months ago.  Patient cycle #3 approximately 1 month ago was held because of leg swelling/slightly worsened renal function.  Patient's swelling of the legs has improved; she is currently off Lasix.  Patient denies any fevers or chills. Denies any significant improvement of the lumps under the arm. No diarrhea or constipation.   REVIEW OF SYSTEMS:  A complete 10 point review of system is done which is negative except mentioned above/history of present illness.   PAST MEDICAL HISTORY :  Past Medical History:  Diagnosis Date  . A-fib ([place])   . Benign neoplasm of rectum and anal canal   . 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 Hypercholesteremia   . Hypertension   . Hyperthyroidism   . Lymphoma ([place]) 2007  . Non Hodgkin's lymphoma ([place]) 01/22/2006  . Personal history of chemotherapy     PAST SURGICAL HISTORY :   Past Surgical History:  Procedure Laterality Date  . ABDOMINAL HYSTERECTOMY  1958  . APPENDECTOMY    . BREAST BIOPSY Right 1970   right breast biopsy with clip- neg  . BREAST BIOPSY Left 2002   left breast biopsy with clip-neg  . BREAST BIOPSY Right 2014   right breast biopsy with clip-neg  . BREAST CYST ASPIRATION Bilateral 2000   bilateral fine needle aspiration  . BREAST SURGERY Left 1990   biopsy  . BREAST SURGERY Right May 08, 2012   complex fibroadenoma without malignancy.  [name] CARDIAC CATHETERIZATION  2014  . COLONOSCOPY  2009, 2012    hyperplastic rectal polyp 2012 tubular adenoma of proximal ascending colon 2000 9 repeat exam due to 2017.  [name] COLONOSCOPY WITH PROPOFOL N/A 11/06/2016  Procedure: COLONOSCOPY WITH PROPOFOL;  Surgeon: Robert Bellow, MD;  Location: [facility] ENDOSCOPY;  Service: Endoscopy;  Laterality: N/A;  . CORONARY ARTERY BYPASS GRAFT  1999  . FLEXIBLE SIGMOIDOSCOPY  1998    FAMILY HISTORY :   Family History  Problem Relation Age of Onset  . Lung cancer Father         Died age 3  . Heart disease Mother        Died age 13  . Asthma Mother   . CAD Brother        double bypass  . Heart attack Sister   . Tuberculosis Sister        48 years old  . Lung cancer Sister        Died in her 52s  . Angina Sister   . Breast cancer Sister 58  . Rheum arthritis Sister   . Aneurysm Sister        Brain, died age 82  . Angina Sister   . Heart disease Brother        Stent placement  . Heart disease Brother        stent placement  . Cancer Other        breast cancer niece, pancreatic cancer niece  . Breast cancer Other   . Ovarian cancer Neg Hx   . Diabetes Neg Hx     SOCIAL HISTORY:   Social History   Tobacco Use  . Smoking status: Never Smoker  . Smokeless tobacco: Never Used  Substance Use Topics  . Alcohol use: Yes    Alcohol/week: 1.2 - 1.8 oz    Types: 1 - 2 Glasses of wine, 1 Shots of liquor per week    Comment: daily-   . Drug use: No    ALLERGIES:  has No Known Allergies.  MEDICATIONS:  Current Outpatient Medications  Medication Sig Dispense Refill  . acyclovir (ZOVIRAX) 400 MG tablet Take 1 tablet (400 mg total) by mouth daily. 30 tablet 5  . dexamethasone (DECADRON) 4 MG tablet Take  1 tablet daily Start 2 days prior to infusion; Take for 2 days each cycle Do not take on the day of infusion. 60 tablet 3  . ezetimibe (ZETIA) 10 MG tablet Take 1 tablet (10 mg total) daily by mouth. 30 tablet 12  . levothyroxine (SYNTHROID, LEVOTHROID) 88 MCG tablet TAKE 1 TABLET BY MOUTH  DAILY BEFORE BREAKFAST 90 tablet 3  . lisinopril-hydrochlorothiazide (PRINZIDE,ZESTORETIC) 20-12.5 MG tablet TAKE 1 TABLET BY MOUTH  DAILY 90 tablet 3  . Melatonin 1 MG CAPS Take 1 capsule by mouth at bedtime.     . metoprolol succinate (TOPROL-XL) 25 MG 24 hr tablet Take 25 mg by mouth daily.     . montelukast (SINGULAIR) 10 MG tablet Take 1 tablet (10 mg total) by mouth at bedtime. Start 2 days prior to infusion. Take it for 4 days. 60 tablet 0  . oxybutynin  (DITROPAN) 5 MG tablet Take 5 mg daily by mouth.     . sulfamethoxazole-trimethoprim (BACTRIM DS,SEPTRA DS) 800-160 MG tablet Take 1 tablet by mouth 3 (three) times a week. 12 tablet 5  . XARELTO 20 MG TABS tablet Take 20 mg by mouth daily with supper.      No current facility-administered medications for this visit.     PHYSICAL EXAMINATION: ECOG PERFORMANCE STATUS: 0 - Asymptomatic  BP 122/74 (BP Location: Left Arm, Patient Position: Sitting)   Pulse 82   Temp (!) 97.1 F (36.2  C) (Tympanic)   Wt 152 lb 3.2 oz (69 kg)   BMI 25.33 kg/m   Filed Weights   02/07/17 0851  Weight: 152 lb 3.2 oz (69 kg)    GENERAL: Well-nourished well-developed; Alert, no distress and comfortable.  Accompanied by her husband. EYES: no pallor or icterus OROPHARYNX: no thrush or ulceration; good dentition  NECK: supple, no masses felt LYMPH: Bil rubbery 1-2 cm palpable lymphadenopathy in the axillary region [?  Improved] LUNGS: clear to auscultation and  No wheeze or crackles HEART/CVS: regular rate & rhythm and no murmurs; No lower extremity edema ABDOMEN:abdomen soft, non-tender and normal bowel sounds Musculoskeletal:no cyanosis of digits and no clubbing  PSYCH: alert & oriented x 3 with fluent speech NEURO: no focal motor/sensory deficits SKIN:  no rashes or significant lesions  LABORATORY DATA:  I have reviewed the data as listed    Component Value Date/Time   NA 141 02/07/2017 0807   NA 144 02/13/2016 1412   NA 141 05/11/2014 0853   K 4.2 02/07/2017 0807   K 3.6 05/11/2014 0853   CL 105 02/07/2017 0807   CL 103 05/11/2014 0853   CO2 29 02/07/2017 0807   CO2 29 05/11/2014 0853   GLUCOSE 96 02/07/2017 0807   GLUCOSE 107 (H) 05/11/2014 0853   BUN 29 (H) 02/07/2017 0807   BUN 24 02/13/2016 1412   BUN 22 (H) 05/11/2014 0853   CREATININE 1.23 (H) 02/07/2017 0807   CREATININE 1.07 (H) 05/11/2014 0853   CALCIUM 10.1 02/07/2017 0807   CALCIUM 10.1 05/11/2014 0853   PROT 7.0 02/07/2017  0807   PROT 6.6 02/13/2016 1412   PROT 7.3 05/11/2014 0853   ALBUMIN 4.1 02/07/2017 0807   ALBUMIN 4.2 02/13/2016 1412   ALBUMIN 4.7 05/11/2014 0853   AST 32 02/07/2017 0807   AST 29 05/11/2014 0853   ALT 22 02/07/2017 0807   ALT 20 05/11/2014 0853   ALKPHOS 70 02/07/2017 0807   ALKPHOS 47 05/11/2014 0853   BILITOT 0.5 02/07/2017 0807   BILITOT 0.6 02/13/2016 1412   BILITOT 1.0 05/11/2014 0853   GFRNONAA 41 (L) 02/07/2017 0807   GFRNONAA 51 (L) 05/11/2014 0853   GFRAA 47 (L) 02/07/2017 0807   GFRAA 59 (L) 05/11/2014 0853    No results found for: SPEP, UPEP  Lab Results  Component Value Date   WBC 4.6 02/07/2017   NEUTROABS 2.6 02/07/2017   HGB 13.2 02/07/2017   HCT 39.4 02/07/2017   MCV 85.7 02/07/2017   PLT 115 (L) 02/07/2017      Chemistry      Component Value Date/Time   NA 141 02/07/2017 0807   NA 144 02/13/2016 1412   NA 141 05/11/2014 0853   K 4.2 02/07/2017 0807   K 3.6 05/11/2014 0853   CL 105 02/07/2017 0807   CL 103 05/11/2014 0853   CO2 29 02/07/2017 0807   CO2 29 05/11/2014 0853   BUN 29 (H) 02/07/2017 0807   BUN 24 02/13/2016 1412   BUN 22 (H) 05/11/2014 0853   CREATININE 1.23 (H) 02/07/2017 0807   CREATININE 1.07 (H) 05/11/2014 0853   GLU 98 05/25/2013      Component Value Date/Time   CALCIUM 10.1 02/07/2017 0807   CALCIUM 10.1 05/11/2014 0853   ALKPHOS 70 02/07/2017 0807   ALKPHOS 47 05/11/2014 0853   AST 32 02/07/2017 0807   AST 29 05/11/2014 0853   ALT 22 02/07/2017 0807   ALT 20 05/11/2014 0853   BILITOT  0.5 02/07/2017 0807   BILITOT 0.6 02/13/2016 1412   BILITOT 1.0 05/11/2014 0853     : 1. No evidence of persistent small bowel intussusception. No definite small bowel neoplasm identified on enterography images. 2. Worsening lymphadenopathy throughout the abdomen and pelvis, concerning for recurrent lymphoma. 3. Circumferential thickening of the distal rectum with some borderline enlarged mesorectal lymph nodes. These findings  are nonspecific, but correlation with sigmoidoscopy or colonoscopy should be considered if there is any clinical concern for underlying colorectal neoplasm. 4. Left adnexal mass measuring 3.3 x 4.2 x 3.8 cm, similar to prior examinations. 5. Cardiomegaly with biatrial dilatation. 6. Aortic atherosclerosis, in addition to least 2 vessel coronary artery disease. Assessment for potential risk factor modification, dietary therapy or pharmacologic therapy may be warranted, if clinically indicated. 7. Additional incidental findings, as above.  Aortic Atherosclerosis (ICD10-I70.0).   Electronically Signed   By: Vinnie Langton M.D.   On: 10/22/2016 12:21   RADIOGRAPHIC STUDIES: I have personally reviewed the radiological images as listed and agreed with the findings in the report. No results found.   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 Platelet count 100s.  #Clinical response noted after 2 cycles.  Tolerating treatments fairly well-except for swelling/worsening renal function [see discussion below]  # proceed with cycle # 3 today;   CBC within normal limits except for mild thrombocytopenia.  # Creat stable- 1.2; currently improved  # fluid retention- improved.   # Thrombocytopenia- 100s asymptomatic; on Xarelto no bleeding.  # follow up in 4 weeks/labs; CT scan prior.    Orders Placed This Encounter  Procedures  . CT CHEST WO CONTRAST    Standing Status:   Future    Standing Expiration Date:   02/07/2018    Scheduling Instructions:     1 day prior to next MD visit- Dr.B    Order Specific Question:   Preferred imaging location?    Answer:   [facility]    Order Specific Question:   Radiology Contrast Protocol - do NOT remove file path    Answer:    \\[email or web address]  . CT Abdomen Pelvis Wo Contrast    Standing Status:   Future    Standing Expiration Date:   02/07/2018    Scheduling Instructions:     1 day prior to next MD visit- Dr.B    Order Specific Question:   Preferred imaging location?    Answer:   [facility]    Order Specific Question:   Call Results- Best Contact Number?    Answer:   [id number]    Order Specific Question:   Radiology Contrast Protocol - do NOT remove file path    Answer:   \\[email or web address]   All questions were answered. The patient knows to call the clinic with any problems, questions or concerns.      Cammie Sickle, MD 02/09/2017 6:53 PM

## 2017-02-07 NOTE — Assessment & Plan Note (Addendum)
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 Platelet count 100s.  #Clinical response noted after 2 cycles.  Tolerating treatments fairly well-except for swelling/worsening renal function [see discussion below]  # proceed with cycle # 3 today;   CBC within normal limits except for mild thrombocytopenia.  # Creat stable- 1.2; currently improved  # fluid retention- improved.   # Thrombocytopenia- 100s asymptomatic; on Xarelto no bleeding.  # follow up in 4 weeks/labs; CT scan prior.

## 2017-02-21 DIAGNOSIS — I340 Nonrheumatic mitral (valve) insufficiency: Secondary | ICD-10-CM | POA: Diagnosis not present

## 2017-02-21 DIAGNOSIS — I25810 Atherosclerosis of coronary artery bypass graft(s) without angina pectoris: Secondary | ICD-10-CM | POA: Diagnosis not present

## 2017-02-21 DIAGNOSIS — I10 Essential (primary) hypertension: Secondary | ICD-10-CM | POA: Diagnosis not present

## 2017-02-21 DIAGNOSIS — I482 Chronic atrial fibrillation: Secondary | ICD-10-CM | POA: Diagnosis not present

## 2017-02-21 DIAGNOSIS — R079 Chest pain, unspecified: Secondary | ICD-10-CM | POA: Diagnosis not present

## 2017-02-21 DIAGNOSIS — E782 Mixed hyperlipidemia: Secondary | ICD-10-CM | POA: Diagnosis not present

## 2017-03-06 ENCOUNTER — Ambulatory Visit
Admission: RE | Admit: 2017-03-06 | Discharge: 2017-03-06 | Disposition: A | Discharge status: Home / Self Care | Payer: Medicare Other | Source: Ambulatory Visit | Attending: Internal Medicine | Admitting: Internal Medicine

## 2017-03-06 DIAGNOSIS — Z90710 Acquired absence of both cervix and uterus: Secondary | ICD-10-CM | POA: Insufficient documentation

## 2017-03-06 DIAGNOSIS — I700 Atherosclerosis of aorta: Secondary | ICD-10-CM | POA: Diagnosis not present

## 2017-03-06 DIAGNOSIS — N839 Noninflammatory disorder of ovary, fallopian tube and broad ligament, unspecified: Secondary | ICD-10-CM | POA: Insufficient documentation

## 2017-03-06 DIAGNOSIS — C8308 Small cell B-cell lymphoma, lymph nodes of multiple sites: Secondary | ICD-10-CM | POA: Diagnosis not present

## 2017-03-06 DIAGNOSIS — C8330 Diffuse large B-cell lymphoma, unspecified site: Secondary | ICD-10-CM | POA: Diagnosis not present

## 2017-03-07 ENCOUNTER — Inpatient Hospital Stay (HOSPITAL_BASED_OUTPATIENT_CLINIC_OR_DEPARTMENT_OTHER): Payer: Medicare Other | Admitting: Internal Medicine

## 2017-03-07 ENCOUNTER — Encounter: Payer: Self-pay | Admitting: Internal Medicine

## 2017-03-07 ENCOUNTER — Inpatient Hospital Stay: Payer: Medicare Other

## 2017-03-07 ENCOUNTER — Inpatient Hospital Stay: Payer: Medicare Other | Attending: Internal Medicine | Admitting: *Deleted

## 2017-03-07 VITALS — BP 116/77 | HR 61 | Temp 97.0°F | Resp 17

## 2017-03-07 VITALS — BP 110/74 | HR 77 | Temp 98.0°F | Resp 16 | Wt 156.4 lb

## 2017-03-07 DIAGNOSIS — C8308 Small cell B-cell lymphoma, lymph nodes of multiple sites: Secondary | ICD-10-CM | POA: Insufficient documentation

## 2017-03-07 DIAGNOSIS — D696 Thrombocytopenia, unspecified: Secondary | ICD-10-CM | POA: Diagnosis not present

## 2017-03-07 DIAGNOSIS — C8590 Non-Hodgkin lymphoma, unspecified, unspecified site: Secondary | ICD-10-CM

## 2017-03-07 DIAGNOSIS — Z5112 Encounter for antineoplastic immunotherapy: Secondary | ICD-10-CM | POA: Diagnosis not present

## 2017-03-07 LAB — CBC WITH DIFFERENTIAL/PLATELET
BASOS ABS: 0 10*3/uL (ref 0–0.1)
BASOS PCT: 1 %
EOS PCT: 3 %
Eosinophils Absolute: 0.1 10*3/uL (ref 0–0.7)
HCT: 41.3 % (ref 35.0–47.0)
Hemoglobin: 13.3 g/dL (ref 12.0–16.0)
Lymphocytes Relative: 21 %
Lymphs Abs: 0.8 10*3/uL — ABNORMAL LOW (ref 1.0–3.6)
MCH: 27.8 pg (ref 26.0–34.0)
MCHC: 32.3 g/dL (ref 32.0–36.0)
MCV: 86.1 fL (ref 80.0–100.0)
MONO ABS: 0.5 10*3/uL (ref 0.2–0.9)
MONOS PCT: 13 %
Neutro Abs: 2.4 10*3/uL (ref 1.4–6.5)
Neutrophils Relative %: 62 %
PLATELETS: 99 10*3/uL — AB (ref 150–440)
RBC: 4.8 MIL/uL (ref 3.80–5.20)
RDW: 15 % — ABNORMAL HIGH (ref 11.5–14.5)
WBC: 3.9 10*3/uL (ref 3.6–11.0)

## 2017-03-07 LAB — COMPREHENSIVE METABOLIC PANEL
ALBUMIN: 4.1 g/dL (ref 3.5–5.0)
ALT: 27 U/L (ref 14–54)
ANION GAP: 8 (ref 5–15)
AST: 32 U/L (ref 15–41)
Alkaline Phosphatase: 68 U/L (ref 38–126)
BILIRUBIN TOTAL: 0.8 mg/dL (ref 0.3–1.2)
BUN: 25 mg/dL — AB (ref 6–20)
CHLORIDE: 106 mmol/L (ref 101–111)
CO2: 24 mmol/L (ref 22–32)
Calcium: 9.8 mg/dL (ref 8.9–10.3)
Creatinine, Ser: 0.96 mg/dL (ref 0.44–1.00)
GFR calc Af Amer: >60 mL/min (ref >60)
GFR, EST NON AFRICAN AMERICAN: 55 mL/min — AB (ref >60)
GLUCOSE: 113 mg/dL — AB (ref 65–99)
POTASSIUM: 3.8 mmol/L (ref 3.5–5.1)
Sodium: 138 mmol/L (ref 135–145)
TOTAL PROTEIN: 6.7 g/dL (ref 6.5–8.1)

## 2017-03-07 LAB — LACTATE DEHYDROGENASE: LDH: 150 U/L (ref 98–192)

## 2017-03-07 MED ORDER — FAMOTIDINE IN NACL 20-0.9 MG/50ML-% IV SOLN: 20.0000 mg | Freq: Once | INTRAVENOUS | Status: DC

## 2017-03-07 MED ORDER — SODIUM CHLORIDE 0.9 % IV SOLN
Freq: Once | INTRAVENOUS | Status: AC
  Administered 2017-03-07: 10:00:00 via INTRAVENOUS
  Filled 2017-03-07: qty 1000

## 2017-03-07 MED ORDER — ACETAMINOPHEN 325 MG PO TABS
650.0000 mg | ORAL_TABLET | Freq: Once | ORAL | Status: AC
  Administered 2017-03-07: 650 mg via ORAL
  Filled 2017-03-07: qty 2

## 2017-03-07 MED ORDER — DIPHENHYDRAMINE HCL 50 MG/ML IJ SOLN
50.0000 mg | Freq: Once | INTRAMUSCULAR | Status: AC
  Administered 2017-03-07: 50 mg via INTRAVENOUS
  Filled 2017-03-07: qty 1

## 2017-03-07 MED ORDER — FAMOTIDINE 20 MG/2ML IV SOLN
20.0000 mg | Freq: Once | INTRAVENOUS | Status: AC
  Administered 2017-03-07: 20 mg via INTRAVENOUS
  Filled 2017-03-07: qty 2

## 2017-03-07 MED ORDER — SODIUM CHLORIDE 0.9 % IV SOLN
1000.0000 mg | Freq: Once | INTRAVENOUS | Status: AC
  Administered 2017-03-07: 1000 mg via INTRAVENOUS
  Filled 2017-03-07: qty 40

## 2017-03-07 MED ORDER — SODIUM CHLORIDE 0.9 % IV SOLN
20.0000 mg | Freq: Once | INTRAVENOUS | Status: AC
  Administered 2017-03-07: 20 mg via INTRAVENOUS
  Filled 2017-03-07: qty 2

## 2017-03-07 NOTE — Progress Notes (Signed)
[place] OFFICE PROGRESS NOTE  Patient Care Team: [name], MD as PCP - General (Unknown Physician Specialty) Bary Castilla, Forest Gleason, MD (General Surgery) Forest Gleason, MD (Oncology) Corey Skains, MD as Consulting Physician (Cardiology) Anell Barr, OD as Consulting Physician (Optometry)  Cancer Staging No matching staging information was found for the patient.    Oncology History   1. Lymphoma low-grade, status Rituxan therapy. 2. Recurrent disease by clinical examination in December of 2012 3. Ovarian mass on PET scan in December of 2012 (normal CA 125). Ovarian mass has resolved after rituximab therapy. 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  Patient had total 5 cycles of chemotherapy with bendamustine and rituximab and 6 cycle was omitted because of significant side effects with and weakness and fatigue.  # OCT 2016- PET significant response; ON surveillance  # 18th OCT 2018- Gazyva;SEP 2018-/OCT 2018 CT scan- progression.   # 11/06/2016- colonoscopy [Dr.Byrnett- 2 polyps]  -------------------------------------------------------------------    # left ? Adnexal mass- ? Etiology; mild SUV uptake incidental [stable compared to previous PET in 2016] Previous workup 2015- vaginal ultrasound negative. MRI February 2018- approximately 3 cm in size; slow increase in the size of the left adnexal mass over at least 3 years- suspect involvement of lymphoma rather than a primary gynecologic malignancy.     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  female pleasant patient above history of Recurrent B-cell lymphoma Small cell type- currently status post cycle 3 of  Gazyva/ is here to review the results of her restaging-interim CAT scan.  Patient notes improvement of bilateral inguinal adenopathy.  Denies any significant improvement of the lumps under the arm. Patient denies any fevers or chills.  No diarrhea or constipation.   REVIEW OF SYSTEMS:  A complete 10 point review of system is done which is negative except mentioned above/history of present illness.   PAST MEDICAL HISTORY :  Past Medical History:  Diagnosis Date  . A-fib ([place])   . Benign neoplasm of rectum and anal canal   . 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 Hypercholesteremia   . Hypertension   . Hyperthyroidism   . Lymphoma ([place]) 2007  . Non Hodgkin's lymphoma ([place]) 01/22/2006  . Personal history of chemotherapy     PAST SURGICAL HISTORY :   Past Surgical History:  Procedure Laterality Date  . ABDOMINAL HYSTERECTOMY  1958  . APPENDECTOMY    . BREAST BIOPSY Right 1970   right breast biopsy with clip- neg  . BREAST BIOPSY Left 2002   left breast biopsy with clip-neg  . BREAST BIOPSY Right 2014   right breast biopsy with clip-neg  . BREAST CYST ASPIRATION Bilateral 2000   bilateral fine needle aspiration  . BREAST SURGERY Left 1990   biopsy  . BREAST SURGERY Right May 08, 2012   complex fibroadenoma without malignancy.  [name] CARDIAC CATHETERIZATION  2014  . COLONOSCOPY  2009, 2012    hyperplastic rectal polyp 2012 tubular adenoma of proximal ascending colon 2000 9 repeat exam due to 2017.  [name] COLONOSCOPY WITH PROPOFOL N/A 11/06/2016   Procedure: COLONOSCOPY WITH PROPOFOL;  Surgeon: Robert Bellow, MD;  Location:  [facility];  Service: Endoscopy;  Laterality: N/A;  . CORONARY ARTERY BYPASS GRAFT  1999  . FLEXIBLE SIGMOIDOSCOPY  1998    FAMILY HISTORY :   Family History  Problem Relation Age of Onset  . Lung cancer Father        Died age 75  . Heart disease Mother        Died age 39  . Asthma Mother   .  CAD Brother        double bypass  . Heart attack Sister   . Tuberculosis Sister        10 years old  . Lung cancer Sister        Died in her 36s  . Angina Sister   . Breast cancer Sister 72  . Rheum arthritis Sister   . Aneurysm Sister        Brain, died age 29  . Angina Sister   . Heart disease Brother        Stent placement  . Heart disease Brother        stent placement  . Cancer Other        breast cancer niece, pancreatic cancer niece  . Breast cancer Other   . Ovarian cancer Neg Hx   . Diabetes Neg Hx     SOCIAL HISTORY:   Social History   Tobacco Use  . Smoking status: Never Smoker  . Smokeless tobacco: Never Used  Substance Use Topics  . Alcohol use: Yes    Alcohol/week: 1.2 - 1.8 oz    Types: 1 - 2 Glasses of wine, 1 Shots of liquor per week    Comment: daily-   . Drug use: No    ALLERGIES:  has No Known Allergies.  MEDICATIONS:  Current Outpatient Medications  Medication Sig Dispense Refill  . acyclovir (ZOVIRAX) 400 MG tablet Take 1 tablet (400 mg total) by mouth daily. 30 tablet 5  . dexamethasone (DECADRON) 4 MG tablet Take  1 tablet daily Start 2 days prior to infusion; Take for 2 days each cycle Do not take on the day of infusion. 60 tablet 3  . ezetimibe (ZETIA) 10 MG tablet Take 1 tablet (10 mg total) daily by mouth. 30 tablet 12  . isosorbide mononitrate (IMDUR) 30 MG 24 hr tablet Take by mouth.    . levothyroxine (SYNTHROID, LEVOTHROID) 88 MCG tablet TAKE 1 TABLET BY MOUTH  DAILY BEFORE BREAKFAST 90 tablet 3  . lisinopril-hydrochlorothiazide (PRINZIDE,ZESTORETIC) 20-12.5 MG tablet TAKE 1 TABLET BY MOUTH  DAILY 90 tablet 3  . Melatonin 1 MG CAPS Take 1 capsule by mouth at bedtime.     . metoprolol succinate (TOPROL-XL) 25 MG 24 hr tablet Take 25 mg by mouth daily.     . metoprolol succinate (TOPROL-XL) 25 MG 24 hr tablet Take by mouth.    . montelukast (SINGULAIR) 10 MG tablet Take 1 tablet (10 mg total) by mouth at bedtime. Start 2 days prior to  infusion. Take it for 4 days. 60 tablet 0  . oxybutynin (DITROPAN) 5 MG tablet Take 5 mg daily by mouth.     . sulfamethoxazole-trimethoprim (BACTRIM DS,SEPTRA DS) 800-160 MG tablet Take 1 tablet by mouth 3 (three) times a week. 12 tablet 5  . XARELTO 20 MG TABS tablet Take 20 mg by mouth daily with supper.      No current facility-administered medications for this visit.     PHYSICAL EXAMINATION: ECOG PERFORMANCE STATUS: 0 - Asymptomatic  BP 110/74 (  BP Location: Left Arm, Patient Position: Sitting)   Pulse 77   Temp 98 F (36.7 C) (Tympanic)   Resp 16   Wt 156 lb 6.4 oz (70.9 kg)   BMI 26.03 kg/m   Filed Weights   03/07/17 0838  Weight: 156 lb 6.4 oz (70.9 kg)    GENERAL: Well-nourished well-developed; Alert, no distress and comfortable.  Accompanied by her husband. EYES: no pallor or icterus OROPHARYNX: no thrush or ulceration; good dentition  NECK: supple, no masses felt LYMPH: Bil rubbery 1-2 cm palpable lymphadenopathy in the axillary region [R>L] LUNGS: clear to auscultation and  No wheeze or crackles HEART/CVS: regular rate & rhythm and no murmurs; No lower extremity edema ABDOMEN:abdomen soft, non-tender and normal bowel sounds Musculoskeletal:no cyanosis of digits and no clubbing  PSYCH: alert & oriented x 3 with fluent speech NEURO: no focal motor/sensory deficits SKIN:  no rashes or significant lesions  LABORATORY DATA:  I have reviewed the data as listed    Component Value Date/Time   NA 138 03/07/2017 0753   NA 144 02/13/2016 1412   NA 141 05/11/2014 0853   K 3.8 03/07/2017 0753   K 3.6 05/11/2014 0853   CL 106 03/07/2017 0753   CL 103 05/11/2014 0853   CO2 24 03/07/2017 0753   CO2 29 05/11/2014 0853   GLUCOSE 113 (H) 03/07/2017 0753   GLUCOSE 107 (H) 05/11/2014 0853   BUN 25 (H) 03/07/2017 0753   BUN 24 02/13/2016 1412   BUN 22 (H) 05/11/2014 0853   CREATININE 0.96 03/07/2017 0753   CREATININE 1.07 (H) 05/11/2014 0853   CALCIUM 9.8 03/07/2017  0753   CALCIUM 10.1 05/11/2014 0853   PROT 6.7 03/07/2017 0753   PROT 6.6 02/13/2016 1412   PROT 7.3 05/11/2014 0853   ALBUMIN 4.1 03/07/2017 0753   ALBUMIN 4.2 02/13/2016 1412   ALBUMIN 4.7 05/11/2014 0853   AST 32 03/07/2017 0753   AST 29 05/11/2014 0853   ALT 27 03/07/2017 0753   ALT 20 05/11/2014 0853   ALKPHOS 68 03/07/2017 0753   ALKPHOS 47 05/11/2014 0853   BILITOT 0.8 03/07/2017 0753   BILITOT 0.6 02/13/2016 1412   BILITOT 1.0 05/11/2014 0853   GFRNONAA 55 (L) 03/07/2017 0753   GFRNONAA 51 (L) 05/11/2014 0853   GFRAA >60 03/07/2017 0753   GFRAA 59 (L) 05/11/2014 0853    No results found for: SPEP, UPEP  Lab Results  Component Value Date   WBC 3.9 03/07/2017   NEUTROABS 2.4 03/07/2017   HGB 13.3 03/07/2017   HCT 41.3 03/07/2017   MCV 86.1 03/07/2017   PLT 99 (L) 03/07/2017      Chemistry      Component Value Date/Time   NA 138 03/07/2017 0753   NA 144 02/13/2016 1412   NA 141 05/11/2014 0853   K 3.8 03/07/2017 0753   K 3.6 05/11/2014 0853   CL 106 03/07/2017 0753   CL 103 05/11/2014 0853   CO2 24 03/07/2017 0753   CO2 29 05/11/2014 0853   BUN 25 (H) 03/07/2017 0753   BUN 24 02/13/2016 1412   BUN 22 (H) 05/11/2014 0853   CREATININE 0.96 03/07/2017 0753   CREATININE 1.07 (H) 05/11/2014 0853   GLU 98 05/25/2013      Component Value Date/Time   CALCIUM 9.8 03/07/2017 0753   CALCIUM 10.1 05/11/2014 0853   ALKPHOS 68 03/07/2017 0753   ALKPHOS 47 05/11/2014 0853   AST 32 03/07/2017 0753   AST 29  05/11/2014 0853   ALT 27 03/07/2017 0753   ALT 20 05/11/2014 0853   BILITOT 0.8 03/07/2017 0753   BILITOT 0.6 02/13/2016 1412   BILITOT 1.0 05/11/2014 0853     : 1. No evidence of persistent small bowel intussusception. No definite small bowel neoplasm identified on enterography images. 2. Worsening lymphadenopathy throughout the abdomen and pelvis, concerning for recurrent lymphoma. 3. Circumferential thickening of the distal rectum with  some borderline enlarged mesorectal lymph nodes. These findings are nonspecific, but correlation with sigmoidoscopy or colonoscopy should be considered if there is any clinical concern for underlying colorectal neoplasm. 4. Left adnexal mass measuring 3.3 x 4.2 x 3.8 cm, similar to prior examinations. 5. Cardiomegaly with biatrial dilatation. 6. Aortic atherosclerosis, in addition to least 2 vessel coronary artery disease. Assessment for potential risk factor modification, dietary therapy or pharmacologic therapy may be warranted, if clinically indicated. 7. Additional incidental findings, as above.  Aortic Atherosclerosis (ICD10-I70.0).   Electronically Signed   By: Vinnie Langton M.D.   On: 10/22/2016 12:21   RADIOGRAPHIC STUDIES: I have personally reviewed the radiological images as listed and agreed with the findings in the report. Ct Abdomen Pelvis Wo Contrast  Result Date: 03/06/2017 CLINICAL DATA:  Recurrent B-cell lymphoma.  Restaging. EXAM: CT CHEST, ABDOMEN AND PELVIS WITHOUT CONTRAST TECHNIQUE: Multidetector CT imaging of the chest, abdomen and pelvis was performed following the standard protocol without IV contrast. COMPARISON:  10/22/2016 abdominopelvic CT.  02/22/2016 PET. FINDINGS: CT CHEST FINDINGS Cardiovascular: Aortic and branch vessel atherosclerosis. Mild cardiomegaly. Median sternotomy for CABG. Mediastinum/Nodes: No supraclavicular adenopathy. Bilateral axillary nodes. Index right axillary node measures 2.9 x 1.3 cm on image 19/series 2 versus 2.3 x 1.4 cm at the same level on the prior. A left axillary index node measures 1.6 cm today and is unchanged compared to the prior PET. A deep right axillary 1.2 cm node has enlarged from 1.0 cm on the prior. Right paratracheal node measures 1.2 cm today versus 1.1 cm previously. Precarinal node of 1.5 cm is similar (when remeasured). Subcarinal node of up to 1.3 cm today versus 8 mm on the prior. Lungs/Pleura: Minimal  left-sided pleural fluid or thickening, new. Suspect a subpleural lymph node along the right major fissure at 2 mm. Musculoskeletal: No acute osseous abnormality. CT ABDOMEN PELVIS FINDINGS Hepatobiliary: High left hepatic lobe cysts. No suspicious liver lesion. Normal gallbladder, without biliary ductal dilatation. Pancreas: Normal, without mass or ductal dilatation. Spleen: Normal in size, without focal abnormality. Adrenals/Urinary Tract: Normal adrenal glands. Upper/interpolar left renal 1.2 cm low-density lesion is likely a cyst or minimally complex cyst, when compared back to the prior contrast-enhanced study. Normal right kidney, without hydronephrosis. Normal urinary bladder. Stomach/Bowel: Normal stomach, without wall thickening. Normal colon and terminal ileum. Normal small bowel. Vascular/Lymphatic: Aortic atherosclerosis. Aortocaval nodes of up to 10 mm today, felt to be similar to the prior diagnostic CT. Right external iliac node measures 10 mm today versus 13 mm on the prior diagnostic CT. Left inguinal nodes measure maximally 10 mm today versus 12 mm on the prior diagnostic CT. Reproductive: Soft tissue density which likely is within or represents the left ovary. 3.7 x 3.9 cm today versus similar at 4.2 x 3.3 cm previously. Probable hysterectomy. Other: No significant free fluid. Moderate pelvic floor laxity. No evidence of omental or peritoneal disease. Musculoskeletal: No acute osseous abnormality. Degenerate disc disease at the lumbosacral junction. IMPRESSION: CT CHEST IMPRESSION 1. Minimal progression of adenopathy within the chest compared to the  PET of 02/22/2016. 2.  Aortic Atherosclerosis (ICD10-I70.0). CT ABDOMEN AND PELVIS IMPRESSION 1. Response to therapy of pelvic adenopathy compared to the most recent diagnostic CT of 10/22/2016. 2. Similar size of a left adnexal mass. 3. Hysterectomy with pelvic floor laxity. Electronically Signed   By: Abigail Miyamoto M.D.   On: 03/06/2017 12:38   Ct  Chest Wo Contrast  Result Date: 03/06/2017 CLINICAL DATA:  Recurrent B-cell lymphoma.  Restaging. EXAM: CT CHEST, ABDOMEN AND PELVIS WITHOUT CONTRAST TECHNIQUE: Multidetector CT imaging of the chest, abdomen and pelvis was performed following the standard protocol without IV contrast. COMPARISON:  10/22/2016 abdominopelvic CT.  02/22/2016 PET. FINDINGS: CT CHEST FINDINGS Cardiovascular: Aortic and branch vessel atherosclerosis. Mild cardiomegaly. Median sternotomy for CABG. Mediastinum/Nodes: No supraclavicular adenopathy. Bilateral axillary nodes. Index right axillary node measures 2.9 x 1.3 cm on image 19/series 2 versus 2.3 x 1.4 cm at the same level on the prior. A left axillary index node measures 1.6 cm today and is unchanged compared to the prior PET. A deep right axillary 1.2 cm node has enlarged from 1.0 cm on the prior. Right paratracheal node measures 1.2 cm today versus 1.1 cm previously. Precarinal node of 1.5 cm is similar (when remeasured). Subcarinal node of up to 1.3 cm today versus 8 mm on the prior. Lungs/Pleura: Minimal left-sided pleural fluid or thickening, new. Suspect a subpleural lymph node along the right major fissure at 2 mm. Musculoskeletal: No acute osseous abnormality. CT ABDOMEN PELVIS FINDINGS Hepatobiliary: High left hepatic lobe cysts. No suspicious liver lesion. Normal gallbladder, without biliary ductal dilatation. Pancreas: Normal, without mass or ductal dilatation. Spleen: Normal in size, without focal abnormality. Adrenals/Urinary Tract: Normal adrenal glands. Upper/interpolar left renal 1.2 cm low-density lesion is likely a cyst or minimally complex cyst, when compared back to the prior contrast-enhanced study. Normal right kidney, without hydronephrosis. Normal urinary bladder. Stomach/Bowel: Normal stomach, without wall thickening. Normal colon and terminal ileum. Normal small bowel. Vascular/Lymphatic: Aortic atherosclerosis. Aortocaval nodes of up to 10 mm today, felt  to be similar to the prior diagnostic CT. Right external iliac node measures 10 mm today versus 13 mm on the prior diagnostic CT. Left inguinal nodes measure maximally 10 mm today versus 12 mm on the prior diagnostic CT. Reproductive: Soft tissue density which likely is within or represents the left ovary. 3.7 x 3.9 cm today versus similar at 4.2 x 3.3 cm previously. Probable hysterectomy. Other: No significant free fluid. Moderate pelvic floor laxity. No evidence of omental or peritoneal disease. Musculoskeletal: No acute osseous abnormality. Degenerate disc disease at the lumbosacral junction. IMPRESSION: CT CHEST IMPRESSION 1. Minimal progression of adenopathy within the chest compared to the PET of 02/22/2016. 2.  Aortic Atherosclerosis (ICD10-I70.0). CT ABDOMEN AND PELVIS IMPRESSION 1. Response to therapy of pelvic adenopathy compared to the most recent diagnostic CT of 10/22/2016. 2. Similar size of a left adnexal mass. 3. Hysterectomy with pelvic floor laxity. Electronically Signed   By: Abigail Miyamoto M.D.   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 Platelet count 100s.  # CT jan 2019 "Worsening axillary mediastinal adenopathy"-likely secondary to not having a baseline CT scan prior to starting treatment;   #Currently status post 3 cycles; tolerating well.  Response as  above.  Also discussed that we will likely get a CT scan after approximately 2 months of therapy~sometime in summer 2019.  Also discussed that if the right axilla lymph node continues to get worse-would recommend a biopsy; check fish etc. the Of option would be ibrutinib.  Would not want to add ibrutinib to current therapy at this time.   # proceed with  cycle # 4 today.  Tolerating treatments fairly well-except for mild thrombocytopenia- 99  # Creat stable- 1.2; currently improved at 0.9.   # Thrombocytopenia- 99; asymptomatic; on Xarelto no bleeding.  # follow up in 4 weeks/labs.   # I reviewed the blood work- with the patient in detail; also reviewed the imaging independently [as summarized above]; and with the patient in detail.     Orders Placed This Encounter  Procedures  . CBC with Differential/Platelet    Standing Status:   Future    Standing Expiration Date:   03/07/2018  . Comprehensive metabolic panel    Standing Status:   Future    Standing Expiration Date:   03/07/2018   All questions were answered. The patient knows to call the clinic with any problems, questions or concerns.      Cammie Sickle, MD 03/07/2017 7:48 PM

## 2017-03-07 NOTE — Assessment & Plan Note (Addendum)
#   Small B-cell lymphocytic lymphoma low-grade- stage IV; on palliative gazyva CT Jan 2019 - " slightly worsening axillary; mediastinal" [see discussion below]; BUT  improving retroperitoneal pelvic lymphadenopathy. Platelet count 100s.  # CT jan 2019 "Worsening axillary mediastinal adenopathy"-likely secondary to not having a baseline CT scan prior to starting treatment;   #Currently status post 3 cycles; tolerating well.  Response as above.  Also discussed that we will likely get a CT scan after approximately 2 months of therapy~sometime in summer 2019.  Also discussed that if the right axilla lymph node continues to get worse-would recommend a biopsy; check fish etc. the Of option would be ibrutinib.  Would not want to add ibrutinib to current therapy at this time.   # proceed with cycle # 4 today.  Tolerating treatments fairly well-except for mild thrombocytopenia- 99  # Creat stable- 1.2; currently improved at 0.9.   # Thrombocytopenia- 99; asymptomatic; on Xarelto no bleeding.  # follow up in 4 weeks/labs.   # I reviewed the blood work- with the patient in detail; also reviewed the imaging independently [as summarized above]; and with the patient in detail.

## 2017-04-01 ENCOUNTER — Ambulatory Visit: Payer: Medicare Other

## 2017-04-04 ENCOUNTER — Inpatient Hospital Stay: Payer: Medicare Other | Attending: Internal Medicine

## 2017-04-04 ENCOUNTER — Encounter: Payer: Self-pay | Admitting: Internal Medicine

## 2017-04-04 ENCOUNTER — Inpatient Hospital Stay: Payer: Medicare Other

## 2017-04-04 ENCOUNTER — Inpatient Hospital Stay (HOSPITAL_BASED_OUTPATIENT_CLINIC_OR_DEPARTMENT_OTHER): Payer: Medicare Other | Admitting: Internal Medicine

## 2017-04-04 VITALS — BP 101/65 | HR 97 | Temp 98.3°F | Resp 18

## 2017-04-04 VITALS — BP 101/68 | HR 86 | Temp 97.6°F | Resp 16 | Wt 156.0 lb

## 2017-04-04 DIAGNOSIS — D696 Thrombocytopenia, unspecified: Secondary | ICD-10-CM | POA: Diagnosis not present

## 2017-04-04 DIAGNOSIS — C8308 Small cell B-cell lymphoma, lymph nodes of multiple sites: Secondary | ICD-10-CM

## 2017-04-04 DIAGNOSIS — Z5112 Encounter for antineoplastic immunotherapy: Secondary | ICD-10-CM | POA: Insufficient documentation

## 2017-04-04 LAB — CBC WITH DIFFERENTIAL/PLATELET
BASOS ABS: 0.1 10*3/uL (ref 0–0.1)
BASOS PCT: 2 %
Eosinophils Absolute: 0.1 10*3/uL (ref 0–0.7)
Eosinophils Relative: 4 %
HEMATOCRIT: 38.9 % (ref 35.0–47.0)
HEMOGLOBIN: 13.2 g/dL (ref 12.0–16.0)
Lymphocytes Relative: 21 %
Lymphs Abs: 0.7 10*3/uL — ABNORMAL LOW (ref 1.0–3.6)
MCH: 27.8 pg (ref 26.0–34.0)
MCHC: 33.9 g/dL (ref 32.0–36.0)
MCV: 82 fL (ref 80.0–100.0)
Monocytes Absolute: 0.5 10*3/uL (ref 0.2–0.9)
Monocytes Relative: 14 %
NEUTROS ABS: 2 10*3/uL (ref 1.4–6.5)
NEUTROS PCT: 59 %
Platelets: 98 10*3/uL — ABNORMAL LOW (ref 150–440)
RBC: 4.75 MIL/uL (ref 3.80–5.20)
RDW: 14.5 % (ref 11.5–14.5)
WBC: 3.4 10*3/uL — AB (ref 3.6–11.0)

## 2017-04-04 LAB — COMPREHENSIVE METABOLIC PANEL
ALK PHOS: 54 U/L (ref 38–126)
ALT: 18 U/L (ref 14–54)
ANION GAP: 8 (ref 5–15)
AST: 25 U/L (ref 15–41)
Albumin: 3.8 g/dL (ref 3.5–5.0)
BILIRUBIN TOTAL: 0.7 mg/dL (ref 0.3–1.2)
BUN: 25 mg/dL — ABNORMAL HIGH (ref 6–20)
CALCIUM: 10.1 mg/dL (ref 8.9–10.3)
CO2: 27 mmol/L (ref 22–32)
Chloride: 104 mmol/L (ref 101–111)
Creatinine, Ser: 1.06 mg/dL — ABNORMAL HIGH (ref 0.44–1.00)
GFR calc non Af Amer: 49 mL/min — ABNORMAL LOW (ref >60)
GFR, EST AFRICAN AMERICAN: 57 mL/min — AB (ref >60)
Glucose, Bld: 82 mg/dL (ref 65–99)
Potassium: 4 mmol/L (ref 3.5–5.1)
Sodium: 139 mmol/L (ref 135–145)
TOTAL PROTEIN: 6.9 g/dL (ref 6.5–8.1)

## 2017-04-04 MED ORDER — SODIUM CHLORIDE 0.9 % IV SOLN
20.0000 mg | Freq: Once | INTRAVENOUS | Status: AC
  Administered 2017-04-04: 20 mg via INTRAVENOUS
  Filled 2017-04-04: qty 2

## 2017-04-04 MED ORDER — SODIUM CHLORIDE 0.9 % IV SOLN
1000.0000 mg | Freq: Once | INTRAVENOUS | Status: AC
  Administered 2017-04-04: 1000 mg via INTRAVENOUS
  Filled 2017-04-04: qty 40

## 2017-04-04 MED ORDER — ACETAMINOPHEN 325 MG PO TABS
650.0000 mg | ORAL_TABLET | Freq: Once | ORAL | Status: AC
  Administered 2017-04-04: 650 mg via ORAL
  Filled 2017-04-04: qty 2

## 2017-04-04 MED ORDER — FAMOTIDINE IN NACL 20-0.9 MG/50ML-% IV SOLN: 20.0000 mg | Freq: Once | INTRAVENOUS | Status: DC

## 2017-04-04 MED ORDER — SODIUM CHLORIDE 0.9 % IV SOLN
Freq: Once | INTRAVENOUS | Status: AC
  Administered 2017-04-04: 09:00:00 via INTRAVENOUS
  Filled 2017-04-04: qty 1000

## 2017-04-04 MED ORDER — SODIUM CHLORIDE 0.9 % IV SOLN
20.0000 mg | Freq: Once | INTRAVENOUS | Status: AC
  Administered 2017-04-04: 20 mg via INTRAVENOUS
  Filled 2017-04-04: qty 100

## 2017-04-04 MED ORDER — DIPHENHYDRAMINE HCL 50 MG/ML IJ SOLN
50.0000 mg | Freq: Once | INTRAMUSCULAR | Status: AC
  Administered 2017-04-04: 50 mg via INTRAVENOUS
  Filled 2017-04-04: qty 1

## 2017-04-04 NOTE — Progress Notes (Signed)
[place] OFFICE PROGRESS NOTE  Patient Care Team: [name], MD as PCP - General (Unknown Physician Specialty) Bary Castilla, Forest Gleason, MD (General Surgery) Forest Gleason, MD (Oncology) Corey Skains, MD as Consulting Physician (Cardiology) Anell Barr, OD as Consulting Physician (Optometry)  Cancer Staging No matching staging information was found for the patient.    Oncology History   1. Lymphoma low-grade, status Rituxan therapy. 2. Recurrent disease by clinical examination in December of 2012 3. Ovarian mass on PET scan in December of 2012 (normal CA 125). Ovarian mass has resolved after rituximab therapy. 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  Patient had total 5 cycles of chemotherapy with bendamustine and rituximab and 6 cycle was omitted because of significant side effects with and weakness and fatigue.  # OCT 2016- PET significant response; ON surveillance  # 18th OCT 2018- Gazyva;SEP 2018-/OCT 2018 CT scan- progression.   # 11/06/2016- colonoscopy [Dr.Byrnett- 2 polyps]  -------------------------------------------------------------------    # left ? Adnexal mass- ? Etiology; mild SUV uptake incidental [stable compared to previous PET in 2016] Previous workup 2015- vaginal ultrasound negative. MRI February 2018- approximately 3 cm in size; slow increase in the size of the left adnexal mass over at least 3 years- suspect involvement of lymphoma rather than a primary gynecologic malignancy.     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  female pleasant patient above history of Recurrent B-cell lymphoma Small cell type- currently status post cycle 4 of  Gazyva/ is here for follow-up.  Patient denies any significant improvement of the lymph nodes in the underarm. Patient notes improvement of bilateral inguinal adenopathy.  Patient denies any fevers or chills.  No diarrhea or constipation.   REVIEW OF SYSTEMS:  A complete 10 point review of system is done which is negative except mentioned above/history of present illness.   PAST MEDICAL HISTORY :  Past Medical History:  Diagnosis Date  . A-fib ([place])   . Benign neoplasm of rectum and anal canal   . 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 Hypercholesteremia   . Hypertension   . Hyperthyroidism   . Lymphoma ([place]) 2007  . Non Hodgkin's lymphoma ([place]) 01/22/2006  . Personal history of chemotherapy     PAST SURGICAL HISTORY :   Past Surgical History:  Procedure Laterality Date  . ABDOMINAL HYSTERECTOMY  1958  . APPENDECTOMY    . BREAST BIOPSY Right 1970   right breast biopsy with clip- neg  . BREAST BIOPSY Left 2002   left breast biopsy with clip-neg  . BREAST BIOPSY Right 2014   right breast biopsy with clip-neg  . BREAST CYST ASPIRATION Bilateral 2000   bilateral fine needle aspiration  . BREAST SURGERY Left 1990   biopsy  . BREAST SURGERY Right May 08, 2012   complex fibroadenoma without malignancy.  [name] CARDIAC CATHETERIZATION  2014  . COLONOSCOPY  2009, 2012    hyperplastic rectal polyp 2012 tubular adenoma of proximal ascending colon 2000 9 repeat exam due to 2017.  [name] COLONOSCOPY WITH PROPOFOL N/A 11/06/2016   Procedure: COLONOSCOPY WITH PROPOFOL;  Surgeon: Robert Bellow, MD;  Location: [facility] ENDOSCOPY;  Service: Endoscopy;  Laterality: N/A;  . CORONARY ARTERY BYPASS GRAFT  1999  . FLEXIBLE SIGMOIDOSCOPY  1998    FAMILY HISTORY :   Family History  Problem Relation Age of Onset  . Lung cancer Father        Died age 38  . Heart disease Mother        Died age 52  . Asthma Mother   . CAD Brother        double  bypass  . Heart attack Sister   . Tuberculosis Sister        74 years old  . Lung cancer Sister        Died in her 80s  . Angina Sister   . Breast cancer Sister 81  . Rheum arthritis Sister   . Aneurysm Sister        Brain, died age 2  . Angina Sister   . Heart disease Brother        Stent placement  . Heart disease Brother        stent placement  . Cancer Other        breast cancer niece, pancreatic cancer niece  . Breast cancer Other   . Ovarian cancer Neg Hx   . Diabetes Neg Hx     SOCIAL HISTORY:   Social History   Tobacco Use  . Smoking status: Never Smoker  . Smokeless tobacco: Never Used  Substance Use Topics  . Alcohol use: Yes    Alcohol/week: 1.2 - 1.8 oz    Types: 1 - 2 Glasses of wine, 1 Shots of liquor per week    Comment: daily-   . Drug use: No    ALLERGIES:  has No Known Allergies.  MEDICATIONS:  Current Outpatient Medications  Medication Sig Dispense Refill  . acyclovir (ZOVIRAX) 400 MG tablet Take 1 tablet (400 mg total) by mouth daily. 30 tablet 5  . dexamethasone (DECADRON) 4 MG tablet Take  1 tablet daily Start 2 days prior to infusion; Take for 2 days each cycle Do not take on the day of infusion. 60 tablet 3  . ezetimibe (ZETIA) 10 MG tablet Take 1 tablet (10 mg total) daily by mouth. 30 tablet 12  . isosorbide mononitrate (IMDUR) 30 MG 24 hr tablet Take by mouth.    . levothyroxine (SYNTHROID, LEVOTHROID) 88 MCG tablet TAKE 1 TABLET BY MOUTH  DAILY BEFORE BREAKFAST 90 tablet 3  . lisinopril-hydrochlorothiazide (PRINZIDE,ZESTORETIC) 20-12.5 MG tablet TAKE 1 TABLET BY MOUTH  DAILY 90 tablet 3  . Melatonin 1 MG CAPS Take 1 capsule by mouth at bedtime.     . metoprolol succinate (TOPROL-XL) 25 MG 24 hr tablet Take 25 mg by mouth daily.     . metoprolol succinate (TOPROL-XL) 25 MG 24 hr tablet Take by mouth.    . montelukast (SINGULAIR) 10 MG tablet Take 1 tablet (10 mg total) by mouth at bedtime. Start 2 days prior to infusion. Take it for 4  days. 60 tablet 0  . oxybutynin (DITROPAN) 5 MG tablet Take 5 mg daily by mouth.     . sulfamethoxazole-trimethoprim (BACTRIM DS,SEPTRA DS) 800-160 MG tablet Take 1 tablet by mouth 3 (three) times a week. 12 tablet 5  . XARELTO 20 MG TABS tablet Take 20 mg by mouth daily with supper.      No current facility-administered medications for this visit.     PHYSICAL EXAMINATION: ECOG PERFORMANCE STATUS: 0 - Asymptomatic  BP 101/68 (BP Location: Left Arm, Patient Position:  Sitting)   Pulse 86   Temp 97.6 F (36.4 C) (Tympanic)   Resp 16   Wt 156 lb (70.8 kg)   BMI 25.96 kg/m   Filed Weights   04/04/17 0845  Weight: 156 lb (70.8 kg)    GENERAL: Well-nourished well-developed; Alert, no distress and comfortable.  Accompanied by her husband. EYES: no pallor or icterus OROPHARYNX: no thrush or ulceration; good dentition  NECK: supple, no masses felt LYMPH: Bil rubbery 1-2 cm palpable lymphadenopathy in the axillary region [R>L] LUNGS: clear to auscultation and  No wheeze or crackles HEART/CVS: regular rate & rhythm and no murmurs; No lower extremity edema ABDOMEN:abdomen soft, non-tender and normal bowel sounds Musculoskeletal:no cyanosis of digits and no clubbing  PSYCH: alert & oriented x 3 with fluent speech NEURO: no focal motor/sensory deficits SKIN:  no rashes or significant lesions  LABORATORY DATA:  I have reviewed the data as listed    Component Value Date/Time   NA 139 04/04/2017 0807   NA 144 02/13/2016 1412   NA 141 05/11/2014 0853   K 4.0 04/04/2017 0807   K 3.6 05/11/2014 0853   CL 104 04/04/2017 0807   CL 103 05/11/2014 0853   CO2 27 04/04/2017 0807   CO2 29 05/11/2014 0853   GLUCOSE 82 04/04/2017 0807   GLUCOSE 107 (H) 05/11/2014 0853   BUN 25 (H) 04/04/2017 0807   BUN 24 02/13/2016 1412   BUN 22 (H) 05/11/2014 0853   CREATININE 1.06 (H) 04/04/2017 0807   CREATININE 1.07 (H) 05/11/2014 0853   CALCIUM 10.1 04/04/2017 0807   CALCIUM 10.1 05/11/2014 0853    PROT 6.9 04/04/2017 0807   PROT 6.6 02/13/2016 1412   PROT 7.3 05/11/2014 0853   ALBUMIN 3.8 04/04/2017 0807   ALBUMIN 4.2 02/13/2016 1412   ALBUMIN 4.7 05/11/2014 0853   AST 25 04/04/2017 0807   AST 29 05/11/2014 0853   ALT 18 04/04/2017 0807   ALT 20 05/11/2014 0853   ALKPHOS 54 04/04/2017 0807   ALKPHOS 47 05/11/2014 0853   BILITOT 0.7 04/04/2017 0807   BILITOT 0.6 02/13/2016 1412   BILITOT 1.0 05/11/2014 0853   GFRNONAA 49 (L) 04/04/2017 0807   GFRNONAA 51 (L) 05/11/2014 0853   GFRAA 57 (L) 04/04/2017 0807   GFRAA 59 (L) 05/11/2014 0853    No results found for: SPEP, UPEP  Lab Results  Component Value Date   WBC 3.4 (L) 04/04/2017   NEUTROABS 2.0 04/04/2017   HGB 13.2 04/04/2017   HCT 38.9 04/04/2017   MCV 82.0 04/04/2017   PLT 98 (L) 04/04/2017      Chemistry      Component Value Date/Time   NA 139 04/04/2017 0807   NA 144 02/13/2016 1412   NA 141 05/11/2014 0853   K 4.0 04/04/2017 0807   K 3.6 05/11/2014 0853   CL 104 04/04/2017 0807   CL 103 05/11/2014 0853   CO2 27 04/04/2017 0807   CO2 29 05/11/2014 0853   BUN 25 (H) 04/04/2017 0807   BUN 24 02/13/2016 1412   BUN 22 (H) 05/11/2014 0853   CREATININE 1.06 (H) 04/04/2017 0807   CREATININE 1.07 (H) 05/11/2014 0853   GLU 98 05/25/2013      Component Value Date/Time   CALCIUM 10.1 04/04/2017 0807   CALCIUM 10.1 05/11/2014 0853   ALKPHOS 54 04/04/2017 0807   ALKPHOS 47 05/11/2014 0853   AST 25 04/04/2017 0807   AST 29 05/11/2014 0853   ALT 18 04/04/2017  0807   ALT 20 05/11/2014 0853   BILITOT 0.7 04/04/2017 0807   BILITOT 0.6 02/13/2016 1412   BILITOT 1.0 05/11/2014 0853     : 1. No evidence of persistent small bowel intussusception. No definite small bowel neoplasm identified on enterography images. 2. Worsening lymphadenopathy throughout the abdomen and pelvis, concerning for recurrent lymphoma. 3. Circumferential thickening of the distal rectum with some borderline enlarged mesorectal  lymph nodes. These findings are nonspecific, but correlation with sigmoidoscopy or colonoscopy should be considered if there is any clinical concern for underlying colorectal neoplasm. 4. Left adnexal mass measuring 3.3 x 4.2 x 3.8 cm, similar to prior examinations. 5. Cardiomegaly with biatrial dilatation. 6. Aortic atherosclerosis, in addition to least 2 vessel coronary artery disease. Assessment for potential risk factor modification, dietary therapy or pharmacologic therapy may be warranted, if clinically indicated. 7. Additional incidental findings, as above.  Aortic Atherosclerosis (ICD10-I70.0).   Electronically Signed   By: Vinnie Langton M.D.   On: 10/22/2016 12:21   RADIOGRAPHIC STUDIES: I have personally reviewed the radiological images as listed and agreed with the findings in the report. No results found.   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 CT Jan 2019 - " slightly worsening axillary; mediastinal" [see discussion below]; BUT  improving retroperitoneal pelvic lymphadenopathy. Platelet count 99.   # proceed with cycle #5 today; Labs today reviewed;  acceptable for treatment today. Will plan CT after cycle #6 ~ 2 months after finsihing up #6.   #  fluctuating creatinine 1.2; currently improved at 0.9.   # Thrombocytopenia- 99; asymptomatic; on Xarelto no bleeding.  # follow up in 4 weeks/labs.   Orders Placed This Encounter  Procedures  . CBC with Differential    Standing Status:   Future    Standing Expiration Date:   04/05/2018  . Comprehensive metabolic panel    Standing Status:   Future    Standing Expiration Date:   04/05/2018   All questions were answered. The patient knows to call the clinic with any problems, questions or concerns.      Cammie Sickle, MD 04/07/2017 7:26 PM

## 2017-04-04 NOTE — Assessment & Plan Note (Addendum)
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 CT Jan 2019 - " slightly worsening axillary; mediastinal" [see discussion below]; BUT  improving retroperitoneal pelvic lymphadenopathy. Platelet count 99.   # proceed with cycle #5 today; Labs today reviewed;  acceptable for treatment today. Will plan CT after cycle #6 ~ 2 months after finsihing up #6.   #  fluctuating creatinine 1.2; currently improved at 0.9.   # Thrombocytopenia- 99; asymptomatic; on Xarelto no bleeding.  # follow up in 4 weeks/labs.

## 2017-04-18 DIAGNOSIS — I10 Essential (primary) hypertension: Secondary | ICD-10-CM | POA: Diagnosis not present

## 2017-04-18 DIAGNOSIS — I25810 Atherosclerosis of coronary artery bypass graft(s) without angina pectoris: Secondary | ICD-10-CM | POA: Diagnosis not present

## 2017-04-18 DIAGNOSIS — I340 Nonrheumatic mitral (valve) insufficiency: Secondary | ICD-10-CM | POA: Diagnosis not present

## 2017-04-18 DIAGNOSIS — I482 Chronic atrial fibrillation: Secondary | ICD-10-CM | POA: Diagnosis not present

## 2017-04-18 DIAGNOSIS — E782 Mixed hyperlipidemia: Secondary | ICD-10-CM | POA: Diagnosis not present

## 2017-05-02 ENCOUNTER — Other Ambulatory Visit: Payer: Self-pay

## 2017-05-02 ENCOUNTER — Inpatient Hospital Stay (HOSPITAL_BASED_OUTPATIENT_CLINIC_OR_DEPARTMENT_OTHER): Payer: Medicare Other | Admitting: Internal Medicine

## 2017-05-02 ENCOUNTER — Encounter: Payer: Self-pay | Admitting: Internal Medicine

## 2017-05-02 ENCOUNTER — Inpatient Hospital Stay: Payer: Medicare Other | Attending: Internal Medicine

## 2017-05-02 ENCOUNTER — Inpatient Hospital Stay: Payer: Medicare Other

## 2017-05-02 VITALS — BP 110/73 | HR 77 | Temp 98.1°F | Resp 16 | Wt 155.0 lb

## 2017-05-02 DIAGNOSIS — C8308 Small cell B-cell lymphoma, lymph nodes of multiple sites: Secondary | ICD-10-CM

## 2017-05-02 DIAGNOSIS — Z5112 Encounter for antineoplastic immunotherapy: Secondary | ICD-10-CM | POA: Diagnosis not present

## 2017-05-02 DIAGNOSIS — D696 Thrombocytopenia, unspecified: Secondary | ICD-10-CM

## 2017-05-02 LAB — COMPREHENSIVE METABOLIC PANEL
ALBUMIN: 4 g/dL (ref 3.5–5.0)
ALT: 20 U/L (ref 14–54)
AST: 30 U/L (ref 15–41)
Alkaline Phosphatase: 68 U/L (ref 38–126)
Anion gap: 9 (ref 5–15)
BUN: 22 mg/dL — AB (ref 6–20)
CO2: 26 mmol/L (ref 22–32)
Calcium: 10 mg/dL (ref 8.9–10.3)
Chloride: 105 mmol/L (ref 101–111)
Creatinine, Ser: 1.13 mg/dL — ABNORMAL HIGH (ref 0.44–1.00)
GFR calc Af Amer: 53 mL/min — ABNORMAL LOW (ref >60)
GFR, EST NON AFRICAN AMERICAN: 45 mL/min — AB (ref >60)
Glucose, Bld: 93 mg/dL (ref 65–99)
POTASSIUM: 3.6 mmol/L (ref 3.5–5.1)
SODIUM: 140 mmol/L (ref 135–145)
Total Bilirubin: 0.7 mg/dL (ref 0.3–1.2)
Total Protein: 7 g/dL (ref 6.5–8.1)

## 2017-05-02 LAB — CBC WITH DIFFERENTIAL/PLATELET
BASOS ABS: 0.1 10*3/uL (ref 0–0.1)
BASOS PCT: 3 %
EOS ABS: 0.1 10*3/uL (ref 0–0.7)
EOS PCT: 3 %
HEMATOCRIT: 40 % (ref 35.0–47.0)
Hemoglobin: 13.5 g/dL (ref 12.0–16.0)
LYMPHS PCT: 17 %
Lymphs Abs: 0.6 10*3/uL — ABNORMAL LOW (ref 1.0–3.6)
MCH: 27.3 pg (ref 26.0–34.0)
MCHC: 33.7 g/dL (ref 32.0–36.0)
MCV: 81 fL (ref 80.0–100.0)
MONOS PCT: 11 %
Monocytes Absolute: 0.4 10*3/uL (ref 0.2–0.9)
Neutro Abs: 2.5 10*3/uL (ref 1.4–6.5)
Neutrophils Relative %: 66 %
Platelets: 110 10*3/uL — ABNORMAL LOW (ref 150–440)
RBC: 4.94 MIL/uL (ref 3.80–5.20)
RDW: 15.2 % — AB (ref 11.5–14.5)
WBC: 3.7 10*3/uL (ref 3.6–11.0)

## 2017-05-02 MED ORDER — ACETAMINOPHEN 325 MG PO TABS
650.0000 mg | ORAL_TABLET | Freq: Once | ORAL | Status: AC
  Administered 2017-05-02: 650 mg via ORAL
  Filled 2017-05-02: qty 2

## 2017-05-02 MED ORDER — DIPHENHYDRAMINE HCL 50 MG/ML IJ SOLN
50.0000 mg | Freq: Once | INTRAMUSCULAR | Status: AC
  Administered 2017-05-02: 50 mg via INTRAVENOUS
  Filled 2017-05-02: qty 1

## 2017-05-02 MED ORDER — ACYCLOVIR 400 MG PO TABS: 400.0000 mg | ORAL_TABLET | Freq: Every day | ORAL | 5 refills | Status: DC

## 2017-05-02 MED ORDER — DEXAMETHASONE SODIUM PHOSPHATE 100 MG/10ML IJ SOLN
20.0000 mg | Freq: Once | INTRAMUSCULAR | Status: AC
  Administered 2017-05-02: 20 mg via INTRAVENOUS
  Filled 2017-05-02: qty 2

## 2017-05-02 MED ORDER — SODIUM CHLORIDE 0.9 % IV SOLN
1000.0000 mg | Freq: Once | INTRAVENOUS | Status: AC
  Administered 2017-05-02: 1000 mg via INTRAVENOUS
  Filled 2017-05-02: qty 40

## 2017-05-02 MED ORDER — SODIUM CHLORIDE 0.9 % IV SOLN
20.0000 mg | Freq: Once | INTRAVENOUS | Status: AC
  Administered 2017-05-02: 20 mg via INTRAVENOUS
  Filled 2017-05-02: qty 100

## 2017-05-02 MED ORDER — FAMOTIDINE IN NACL 20-0.9 MG/50ML-% IV SOLN: 20.0000 mg | Freq: Once | INTRAVENOUS | Status: DC

## 2017-05-02 MED ORDER — SODIUM CHLORIDE 0.9 % IV SOLN
Freq: Once | INTRAVENOUS | Status: AC
  Administered 2017-05-02: 10:00:00 via INTRAVENOUS
  Filled 2017-05-02: qty 1000

## 2017-05-02 NOTE — Progress Notes (Signed)
[facility] OFFICE PROGRESS NOTE  Patient Care Team: [name], MD as PCP - General (Unknown Physician Specialty) Bary Castilla, Forest Gleason, MD (General Surgery) Forest Gleason, MD (Oncology) Corey Skains, MD as Consulting Physician (Cardiology) Anell Barr, OD as Consulting Physician (Optometry)  Cancer Staging No matching staging information was found for the patient.    Oncology History   1. Lymphoma low-grade, status Rituxan therapy. 2. Recurrent disease by clinical examination in December of 2012 3. Ovarian mass on PET scan in December of 2012 (normal CA 125). Ovarian mass has resolved after rituximab therapy. 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  Patient had total 5 cycles of chemotherapy with bendamustine and rituximab and 6 cycle was omitted because of significant side effects with and weakness and fatigue.  # OCT 2016- PET significant response; ON surveillance  # 18th OCT 2018- Gazyva;SEP 2018-/OCT 2018 CT scan- progression.   # 11/06/2016- colonoscopy [Dr.Byrnett- 2 polyps]  -------------------------------------------------------------------    # left ? Adnexal mass- ? Etiology; mild SUV uptake incidental [stable compared to previous PET in 2016] Previous workup 2015- vaginal ultrasound negative. MRI February 2018- approximately 3 cm in size; slow increase in the size of the left adnexal mass over at least 3 years- suspect involvement of lymphoma rather than a primary gynecologic malignancy.     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)     INTERVAL HISTORY:  Jacqueline Webb 77 y.o.  female pleasant patient above history of Recurrent B-cell lymphoma Small cell type- currently status post cycle 5 of  Gazyva/ is here for follow-up.  Patient continues to deny any significant improvement of worsening of the underarm lymph nodes.  Denies any worsening of the bilateral inguinal adenopathy. Denies any worsening swelling in the legs.  Patient denies any fevers or chills.  No diarrhea or constipation.   REVIEW OF SYSTEMS:  A complete 10 point review of system is done which is negative except mentioned above/history of present illness.   PAST MEDICAL HISTORY :  Past Medical History:  Diagnosis Date  . A-fib ([place])   . Benign neoplasm of rectum and anal canal   . 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 Hypercholesteremia   . Hypertension   . Hyperthyroidism   . Lymphoma ([place]) 2007  . Non Hodgkin's lymphoma ([place]) 01/22/2006  . Personal history of chemotherapy     PAST SURGICAL HISTORY :   Past Surgical History:  Procedure Laterality Date  . ABDOMINAL HYSTERECTOMY  1958  . APPENDECTOMY    . BREAST BIOPSY Right 1970   right breast biopsy with clip- neg  . BREAST BIOPSY Left 2002   left breast biopsy with clip-neg  . BREAST BIOPSY Right 2014   right breast biopsy with clip-neg  . BREAST CYST ASPIRATION Bilateral 2000   bilateral fine needle aspiration  . BREAST SURGERY Left 1990   biopsy  . BREAST SURGERY Right May 08, 2012   complex fibroadenoma without malignancy.  [name] CARDIAC CATHETERIZATION  2014  . COLONOSCOPY  2009, 2012    hyperplastic rectal polyp 2012 tubular adenoma of proximal ascending colon 2000 9 repeat exam due to 2017.  [name] COLONOSCOPY WITH PROPOFOL N/A 11/06/2016   Procedure: COLONOSCOPY WITH PROPOFOL;  Surgeon:  Robert Bellow, MD;  Location: [facility] ENDOSCOPY;  Service: Endoscopy;  Laterality: N/A;  . CORONARY ARTERY BYPASS GRAFT  1999  . FLEXIBLE SIGMOIDOSCOPY  1998    FAMILY HISTORY :   Family History  Problem Relation Age of Onset  . Lung cancer Father        Died age 92  . Heart disease Mother         Died age 32  . Asthma Mother   . CAD Brother        double bypass  . Heart attack Sister   . Tuberculosis Sister        4 years old  . Lung cancer Sister        Died in her 33s  . Angina Sister   . Breast cancer Sister 5  . Rheum arthritis Sister   . Aneurysm Sister        Brain, died age 10  . Angina Sister   . Heart disease Brother        Stent placement  . Heart disease Brother        stent placement  . Cancer Other        breast cancer niece, pancreatic cancer niece  . Breast cancer Other   . Ovarian cancer Neg Hx   . Diabetes Neg Hx     SOCIAL HISTORY:   Social History   Tobacco Use  . Smoking status: Never Smoker  . Smokeless tobacco: Never Used  Substance Use Topics  . Alcohol use: Yes    Alcohol/week: 1.2 - 1.8 oz    Types: 1 - 2 Glasses of wine, 1 Shots of liquor per week    Comment: daily-   . Drug use: No    ALLERGIES:  has No Known Allergies.  MEDICATIONS:  Current Outpatient Medications  Medication Sig Dispense Refill  . acyclovir (ZOVIRAX) 400 MG tablet Take 1 tablet (400 mg total) by mouth daily. 90 tablet 5  . dexamethasone (DECADRON) 4 MG tablet Take  1 tablet daily Start 2 days prior to infusion; Take for 2 days each cycle Do not take on the day of infusion. 60 tablet 3  . ezetimibe (ZETIA) 10 MG tablet Take 1 tablet (10 mg total) daily by mouth. 30 tablet 12  . isosorbide mononitrate (IMDUR) 30 MG 24 hr tablet Take by mouth.    . levothyroxine (SYNTHROID, LEVOTHROID) 88 MCG tablet TAKE 1 TABLET BY MOUTH  DAILY BEFORE BREAKFAST 90 tablet 3  . lisinopril-hydrochlorothiazide (PRINZIDE,ZESTORETIC) 20-12.5 MG tablet TAKE 1 TABLET BY MOUTH  DAILY 90 tablet 3  . Melatonin 1 MG CAPS Take 1 capsule by mouth at bedtime.     . metoprolol succinate (TOPROL-XL) 25 MG 24 hr tablet Take 25 mg by mouth daily.     . metoprolol succinate (TOPROL-XL) 25 MG 24 hr tablet Take by mouth.    . montelukast (SINGULAIR) 10 MG tablet Take 1 tablet (10 mg total) by mouth  at bedtime. Start 2 days prior to infusion. Take it for 4 days. 60 tablet 0  . oxybutynin (DITROPAN) 5 MG tablet Take 5 mg daily by mouth.     . sulfamethoxazole-trimethoprim (BACTRIM DS,SEPTRA DS) 800-160 MG tablet Take 1 tablet by mouth 3 (three) times a week. 12 tablet 5  . XARELTO 20 MG TABS tablet Take 20 mg by mouth daily with supper.      No current facility-administered medications for this visit.     PHYSICAL EXAMINATION: ECOG PERFORMANCE STATUS:  0 - Asymptomatic  BP 110/73 (BP Location: Left Arm, Patient Position: Sitting)   Pulse 77   Temp 98.1 F (36.7 C) (Tympanic)   Resp 16   Wt 155 lb (70.3 kg)   BMI 25.79 kg/m   Filed Weights   05/02/17 0845  Weight: 155 lb (70.3 kg)    GENERAL: Well-nourished well-developed; Alert, no distress and comfortable.  Accompanied by her husband. EYES: no pallor or icterus OROPHARYNX: no thrush or ulceration; good dentition  NECK: supple, no masses felt LYMPH: Bil rubbery 1-2 cm palpable lymphadenopathy in the axillary region [R>L] LUNGS: clear to auscultation and  No wheeze or crackles HEART/CVS: regular rate & rhythm and no murmurs; No lower extremity edema ABDOMEN:abdomen soft, non-tender and normal bowel sounds Musculoskeletal:no cyanosis of digits and no clubbing  PSYCH: alert & oriented x 3 with fluent speech NEURO: no focal motor/sensory deficits SKIN:  no rashes or significant lesions  LABORATORY DATA:  I have reviewed the data as listed    Component Value Date/Time   NA 140 05/02/2017 0800   NA 144 02/13/2016 1412   NA 141 05/11/2014 0853   K 3.6 05/02/2017 0800   K 3.6 05/11/2014 0853   CL 105 05/02/2017 0800   CL 103 05/11/2014 0853   CO2 26 05/02/2017 0800   CO2 29 05/11/2014 0853   GLUCOSE 93 05/02/2017 0800   GLUCOSE 107 (H) 05/11/2014 0853   BUN 22 (H) 05/02/2017 0800   BUN 24 02/13/2016 1412   BUN 22 (H) 05/11/2014 0853   CREATININE 1.13 (H) 05/02/2017 0800   CREATININE 1.07 (H) 05/11/2014 0853    CALCIUM 10.0 05/02/2017 0800   CALCIUM 10.1 05/11/2014 0853   PROT 7.0 05/02/2017 0800   PROT 6.6 02/13/2016 1412   PROT 7.3 05/11/2014 0853   ALBUMIN 4.0 05/02/2017 0800   ALBUMIN 4.2 02/13/2016 1412   ALBUMIN 4.7 05/11/2014 0853   AST 30 05/02/2017 0800   AST 29 05/11/2014 0853   ALT 20 05/02/2017 0800   ALT 20 05/11/2014 0853   ALKPHOS 68 05/02/2017 0800   ALKPHOS 47 05/11/2014 0853   BILITOT 0.7 05/02/2017 0800   BILITOT 0.6 02/13/2016 1412   BILITOT 1.0 05/11/2014 0853   GFRNONAA 45 (L) 05/02/2017 0800   GFRNONAA 51 (L) 05/11/2014 0853   GFRAA 53 (L) 05/02/2017 0800   GFRAA 59 (L) 05/11/2014 0853    No results found for: SPEP, UPEP  Lab Results  Component Value Date   WBC 3.7 05/02/2017   NEUTROABS 2.5 05/02/2017   HGB 13.5 05/02/2017   HCT 40.0 05/02/2017   MCV 81.0 05/02/2017   PLT 110 (L) 05/02/2017      Chemistry      Component Value Date/Time   NA 140 05/02/2017 0800   NA 144 02/13/2016 1412   NA 141 05/11/2014 0853   K 3.6 05/02/2017 0800   K 3.6 05/11/2014 0853   CL 105 05/02/2017 0800   CL 103 05/11/2014 0853   CO2 26 05/02/2017 0800   CO2 29 05/11/2014 0853   BUN 22 (H) 05/02/2017 0800   BUN 24 02/13/2016 1412   BUN 22 (H) 05/11/2014 0853   CREATININE 1.13 (H) 05/02/2017 0800   CREATININE 1.07 (H) 05/11/2014 0853   GLU 98 05/25/2013      Component Value Date/Time   CALCIUM 10.0 05/02/2017 0800   CALCIUM 10.1 05/11/2014 0853   ALKPHOS 68 05/02/2017 0800   ALKPHOS 47 05/11/2014 0853   AST 30 05/02/2017 0800  AST 29 05/11/2014 0853   ALT 20 05/02/2017 0800   ALT 20 05/11/2014 0853   BILITOT 0.7 05/02/2017 0800   BILITOT 0.6 02/13/2016 1412   BILITOT 1.0 05/11/2014 0853     : 1. No evidence of persistent small bowel intussusception. No definite small bowel neoplasm identified on enterography images. 2. Worsening lymphadenopathy throughout the abdomen and pelvis, concerning for recurrent lymphoma. 3. Circumferential thickening of the  distal rectum with some borderline enlarged mesorectal lymph nodes. These findings are nonspecific, but correlation with sigmoidoscopy or colonoscopy should be considered if there is any clinical concern for underlying colorectal neoplasm. 4. Left adnexal mass measuring 3.3 x 4.2 x 3.8 cm, similar to prior examinations. 5. Cardiomegaly with biatrial dilatation. 6. Aortic atherosclerosis, in addition to least 2 vessel coronary artery disease. Assessment for potential risk factor modification, dietary therapy or pharmacologic therapy may be warranted, if clinically indicated. 7. Additional incidental findings, as above.  Aortic Atherosclerosis (ICD10-I70.0).   Electronically Signed   By: Vinnie Langton M.D.   On: 10/22/2016 12:21   RADIOGRAPHIC STUDIES: I have personally reviewed the radiological images as listed and agreed with the findings in the report. No results found.   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 CT Jan 2019 - " slightly worsening axillary; mediastinal" [see discussion below]; BUT  improving retroperitoneal pelvic lymphadenopathy.   # proceed with cycle #6 today; Labs today reviewed;  acceptable for treatment today. Will plan CT after cycle #6 ~ 2 months after finsihing up after this cycle.   #  fluctuating creatinine 1.2/stable.   # Thrombocytopenia- 110 sec to Gazyva; asymptomatic; on Xarelto no bleeding.  # follow up  In 2 months/labs-CT scans prior.  Patient to call us if any worsening lymphadenopathy.   Orders Placed This Encounter  Procedures  . CT CHEST W CONTRAST    Standing Status:   Future    Standing Expiration Date:   05/03/2018    Order Specific Question:   If indicated for the ordered procedure, I authorize the administration of contrast media per Radiology protocol    Answer:   Yes    Order  Specific Question:   Preferred imaging location?    Answer:   [facility]    Order Specific Question:   Radiology Contrast Protocol - do NOT remove file path    Answer:   \\[email or web address]  . CT Abdomen Pelvis W Contrast    Standing Status:   Future    Standing Expiration Date:   05/02/2018    Order Specific Question:   If indicated for the ordered procedure, I authorize the administration of contrast media per Radiology protocol    Answer:   Yes    Order Specific Question:   Preferred imaging location?    Answer:   [facility]    Order Specific Question:   Radiology Contrast Protocol - do NOT remove file path    Answer:   \\[email or web address]   All questions were answered. The patient knows to call the clinic with any problems, questions or concerns.      Cammie Sickle, MD 05/03/2017 10:19 AM

## 2017-05-02 NOTE — Assessment & Plan Note (Addendum)
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 CT Jan 2019 - " slightly worsening axillary; mediastinal" [see discussion below]; BUT  improving retroperitoneal pelvic lymphadenopathy.   # proceed with cycle #6 today; Labs today reviewed;  acceptable for treatment today. Will plan CT after cycle #6 ~ 2 months after finsihing up after this cycle.   #  fluctuating creatinine 1.2/stable.   # Thrombocytopenia- 110 sec to Gazyva; asymptomatic; on Xarelto no bleeding.  # follow up  In 2 months/labs-CT scans prior.  Patient to call us if any worsening lymphadenopathy.

## 2017-06-04 ENCOUNTER — Ambulatory Visit: Payer: Self-pay | Admitting: Family Medicine

## 2017-06-04 DIAGNOSIS — H401131 Primary open-angle glaucoma, bilateral, mild stage: Secondary | ICD-10-CM | POA: Diagnosis not present

## 2017-06-04 DIAGNOSIS — Z961 Presence of intraocular lens: Secondary | ICD-10-CM | POA: Diagnosis not present

## 2017-07-03 ENCOUNTER — Encounter: Payer: Self-pay | Admitting: Family Medicine

## 2017-07-03 ENCOUNTER — Ambulatory Visit (INDEPENDENT_AMBULATORY_CARE_PROVIDER_SITE_OTHER): Payer: Medicare Other | Admitting: Family Medicine

## 2017-07-03 ENCOUNTER — Ambulatory Visit
Admission: RE | Admit: 2017-07-03 | Discharge: 2017-07-03 | Disposition: A | Discharge status: Home / Self Care | Payer: Medicare Other | Source: Ambulatory Visit | Attending: Internal Medicine | Admitting: Internal Medicine

## 2017-07-03 VITALS — BP 122/60 | HR 90 | Temp 98.4°F | Resp 16 | Ht 65.0 in | Wt 150.0 lb

## 2017-07-03 DIAGNOSIS — C8300 Small cell B-cell lymphoma, unspecified site: Secondary | ICD-10-CM

## 2017-07-03 DIAGNOSIS — C8308 Small cell B-cell lymphoma, lymph nodes of multiple sites: Secondary | ICD-10-CM | POA: Diagnosis not present

## 2017-07-03 DIAGNOSIS — E7800 Pure hypercholesterolemia, unspecified: Secondary | ICD-10-CM

## 2017-07-03 DIAGNOSIS — B029 Zoster without complications: Secondary | ICD-10-CM | POA: Diagnosis not present

## 2017-07-03 DIAGNOSIS — E039 Hypothyroidism, unspecified: Secondary | ICD-10-CM | POA: Diagnosis not present

## 2017-07-03 DIAGNOSIS — I25810 Atherosclerosis of coronary artery bypass graft(s) without angina pectoris: Secondary | ICD-10-CM | POA: Diagnosis not present

## 2017-07-03 DIAGNOSIS — I481 Persistent atrial fibrillation: Secondary | ICD-10-CM

## 2017-07-03 DIAGNOSIS — I4819 Other persistent atrial fibrillation: Secondary | ICD-10-CM

## 2017-07-03 DIAGNOSIS — R5383 Other fatigue: Secondary | ICD-10-CM | POA: Diagnosis not present

## 2017-07-03 DIAGNOSIS — C8590 Non-Hodgkin lymphoma, unspecified, unspecified site: Secondary | ICD-10-CM | POA: Diagnosis not present

## 2017-07-03 LAB — POCT I-STAT CREATININE: Creatinine, Ser: 1 mg/dL (ref 0.44–1.00)

## 2017-07-03 MED ORDER — IOPAMIDOL (ISOVUE-300) INJECTION 61%
100.0000 mL | Freq: Once | INTRAVENOUS | Status: AC | PRN
  Administered 2017-07-03: 100 mL via INTRAVENOUS

## 2017-07-03 NOTE — Progress Notes (Signed)
       Patient: Jacqueline Webb Female    DOB: 06/19/1940   77 y.o.   MRN: [id number] Visit Date: 07/03/2017  Today's Provider: [name], MD   No chief complaint on file.  Subjective:    HPI     No Known Allergies   Current Outpatient Medications:  .  acyclovir (ZOVIRAX) 400 MG tablet, Take 1 tablet (400 mg total) by mouth daily., Disp: 90 tablet, Rfl: 5 .  dexamethasone (DECADRON) 4 MG tablet, Take  1 tablet daily Start 2 days prior to infusion; Take for 2 days each cycle Do not take on the day of infusion., Disp: 60 tablet, Rfl: 3 .  ezetimibe (ZETIA) 10 MG tablet, Take 1 tablet (10 mg total) daily by mouth., Disp: 30 tablet, Rfl: 12 .  isosorbide mononitrate (IMDUR) 30 MG 24 hr tablet, Take by mouth., Disp: , Rfl:  .  levothyroxine (SYNTHROID, LEVOTHROID) 88 MCG tablet, TAKE 1 TABLET BY MOUTH  DAILY BEFORE BREAKFAST, Disp: 90 tablet, Rfl: 3 .  lisinopril-hydrochlorothiazide (PRINZIDE,ZESTORETIC) 20-12.5 MG tablet, TAKE 1 TABLET BY MOUTH  DAILY, Disp: 90 tablet, Rfl: 3 .  Melatonin 1 MG CAPS, Take 1 capsule by mouth at bedtime. , Disp: , Rfl:  .  metoprolol succinate (TOPROL-XL) 25 MG 24 hr tablet, Take 25 mg by mouth daily. , Disp: , Rfl:  .  metoprolol succinate (TOPROL-XL) 25 MG 24 hr tablet, Take by mouth., Disp: , Rfl:  .  montelukast (SINGULAIR) 10 MG tablet, Take 1 tablet (10 mg total) by mouth at bedtime. Start 2 days prior to infusion. Take it for 4 days., Disp: 60 tablet, Rfl: 0 .  oxybutynin (DITROPAN) 5 MG tablet, Take 5 mg daily by mouth. , Disp: , Rfl:  .  sulfamethoxazole-trimethoprim (BACTRIM DS,SEPTRA DS) 800-160 MG tablet, Take 1 tablet by mouth 3 (three) times a week., Disp: 12 tablet, Rfl: 5 .  XARELTO 20 MG TABS tablet, Take 20 mg by mouth daily with supper. , Disp: , Rfl:   Review of Systems  Social History   Tobacco Use  . Smoking status: Never Smoker  . Smokeless tobacco: Never Used  Substance Use Topics  . Alcohol use: Yes   Alcohol/week: 1.2 - 1.8 oz    Types: 1 - 2 Glasses of wine, 1 Shots of liquor per week    Comment: daily-    Objective:   There were no vitals taken for this visit. There were no vitals filed for this visit.   Physical Exam      Assessment & Plan:           [name], MD  [facility]

## 2017-07-03 NOTE — Progress Notes (Signed)
Patient: Jacqueline Webb Female    DOB: 1940/09/27   76 y.o.   MRN: [id number] Visit Date: 07/03/2017  Today's Provider: [name], MD   Chief Complaint  Patient presents with  . Hyperlipidemia  . Hypothyroidism   Subjective:    HPI Patient is a 77 year old female who presents today for a follow up of chronic issues. She was last seen in the office 6 months ago. She feels well today with minor complaints.  Hypothyroidism- No changes were made in her medications. She is currently taking Levothyroxine 58mcg daily.   Afib- No changes were made. She is currently taking Xarelto 20mg  daily.     No Known Allergies   Current Outpatient Medications:  .  acyclovir (ZOVIRAX) 400 MG tablet, Take 1 tablet (400 mg total) by mouth daily., Disp: 90 tablet, Rfl: 5 .  dexamethasone (DECADRON) 4 MG tablet, Take  1 tablet daily Start 2 days prior to infusion; Take for 2 days each cycle Do not take on the day of infusion., Disp: 60 tablet, Rfl: 3 .  ezetimibe (ZETIA) 10 MG tablet, Take 1 tablet (10 mg total) daily by mouth., Disp: 30 tablet, Rfl: 12 .  isosorbide mononitrate (IMDUR) 30 MG 24 hr tablet, Take by mouth., Disp: , Rfl:  .  levothyroxine (SYNTHROID, LEVOTHROID) 88 MCG tablet, TAKE 1 TABLET BY MOUTH  DAILY BEFORE BREAKFAST, Disp: 90 tablet, Rfl: 3 .  lisinopril-hydrochlorothiazide (PRINZIDE,ZESTORETIC) 20-12.5 MG tablet, TAKE 1 TABLET BY MOUTH  DAILY, Disp: 90 tablet, Rfl: 3 .  Melatonin 1 MG CAPS, Take 1 capsule by mouth at bedtime. , Disp: , Rfl:  .  metoprolol succinate (TOPROL-XL) 25 MG 24 hr tablet, Take 25 mg by mouth daily. , Disp: , Rfl:  .  metoprolol succinate (TOPROL-XL) 25 MG 24 hr tablet, Take by mouth., Disp: , Rfl:  .  montelukast (SINGULAIR) 10 MG tablet, Take 1 tablet (10 mg total) by mouth at bedtime. Start 2 days prior to infusion. Take it for 4 days., Disp: 60 tablet, Rfl: 0 .  oxybutynin (DITROPAN) 5 MG tablet, Take 5 mg daily by mouth. , Disp: , Rfl:    .  XARELTO 20 MG TABS tablet, Take 20 mg by mouth daily with supper. , Disp: , Rfl:  .  sulfamethoxazole-trimethoprim (BACTRIM DS,SEPTRA DS) 800-160 MG tablet, Take 1 tablet by mouth 3 (three) times a week. (Patient not taking: Reported on 07/03/2017), Disp: 12 tablet, Rfl: 5  Review of Systems  Constitutional: Positive for fatigue.  HENT: Negative.   Eyes: Negative.   Respiratory: Negative.   Cardiovascular: Negative.   Gastrointestinal: Negative.   Endocrine: Negative.   Genitourinary: Negative.   Musculoskeletal: Negative.   Skin: Positive for rash.  Allergic/Immunologic: Negative.   Neurological: Negative.   Psychiatric/Behavioral: Negative.     Social History   Tobacco Use  . Smoking status: Never Smoker  . Smokeless tobacco: Never Used  Substance Use Topics  . Alcohol use: Yes    Alcohol/week: 1.2 - 1.8 oz    Types: 1 - 2 Glasses of wine, 1 Shots of liquor per week    Comment: daily-    Objective:   BP 122/60 (BP Location: Right Arm, Patient Position: Sitting, Cuff Size: Normal)   Pulse 90   Temp 98.4 F (36.9 C)   Resp 16   Ht 5\' 5"  (1.651 m)   Wt 150 lb (68 kg)   SpO2 96%   BMI 24.96 kg/m  Vitals:   07/03/17 1330  BP: 122/60  Pulse: 90  Resp: 16  Temp: 98.4 F (36.9 C)  SpO2: 96%  Weight: 150 lb (68 kg)  Height: 5\' 5"  (1.651 m)     Physical Exam  Constitutional: She is oriented to person, place, and time. She appears well-developed and well-nourished.  HENT:  Head: Normocephalic and atraumatic.  Right Ear: External ear normal.  Left Ear: External ear normal.  Nose: Nose normal.  Mouth/Throat: Oropharynx is clear and moist.  Eyes: Conjunctivae are normal. No scleral icterus.  Neck: No thyromegaly present.  Cardiovascular: Normal rate, regular rhythm and normal heart sounds.  Pulmonary/Chest: Effort normal and breath sounds normal.  Abdominal: Soft.  Musculoskeletal: She exhibits no edema.  Neurological: She is alert and oriented to person,  place, and time.  Skin: Skin is warm and dry.  Rash over right forehead,vesicular--c/w shingle.  Psychiatric: She has a normal mood and affect. Her behavior is normal. Judgment and thought content normal.        Assessment & Plan:     1. Adult hypothyroidism  - TSH  2. Hypercholesteremia  - Comprehensive metabolic panel - Lipid panel  3. Other fatigue  - CBC with Differential/Platelet  4. Shingles of right forehead/rash Already on Acyclovir.Pt to see eye doctor. No obvious eye involvement. 5.Small Cell Bcell Lymphoma Per Oncology. 6.Afib 7.CAD All risk factors treated.      I have done the exam and reviewed the chart and it is accurate to the best of my knowledge. Development worker, community has been used and  any errors in dictation or transcription are unintentional. Miguel Aschoff M.D. [place], MD  [facility]

## 2017-07-04 ENCOUNTER — Inpatient Hospital Stay (HOSPITAL_BASED_OUTPATIENT_CLINIC_OR_DEPARTMENT_OTHER): Payer: Medicare Other | Admitting: Hematology and Oncology

## 2017-07-04 ENCOUNTER — Other Ambulatory Visit: Payer: Self-pay | Admitting: Hematology and Oncology

## 2017-07-04 ENCOUNTER — Other Ambulatory Visit: Payer: Self-pay

## 2017-07-04 ENCOUNTER — Inpatient Hospital Stay: Payer: Medicare Other | Attending: Hematology and Oncology

## 2017-07-04 VITALS — BP 145/77 | HR 80 | Temp 97.9°F | Resp 18 | Ht 65.0 in | Wt 150.0 lb

## 2017-07-04 DIAGNOSIS — R634 Abnormal weight loss: Secondary | ICD-10-CM | POA: Diagnosis not present

## 2017-07-04 DIAGNOSIS — C8308 Small cell B-cell lymphoma, lymph nodes of multiple sites: Secondary | ICD-10-CM | POA: Diagnosis not present

## 2017-07-04 DIAGNOSIS — B029 Zoster without complications: Secondary | ICD-10-CM

## 2017-07-04 DIAGNOSIS — N949 Unspecified condition associated with female genital organs and menstrual cycle: Secondary | ICD-10-CM | POA: Diagnosis not present

## 2017-07-04 DIAGNOSIS — D696 Thrombocytopenia, unspecified: Secondary | ICD-10-CM | POA: Insufficient documentation

## 2017-07-04 DIAGNOSIS — Z7189 Other specified counseling: Secondary | ICD-10-CM

## 2017-07-04 LAB — COMPREHENSIVE METABOLIC PANEL
A/G RATIO: 2 (ref 1.2–2.2)
ALBUMIN: 4.3 g/dL (ref 3.5–4.8)
ALK PHOS: 75 IU/L (ref 39–117)
ALT: 25 IU/L (ref 0–32)
AST: 34 IU/L (ref 0–40)
BILIRUBIN TOTAL: 0.5 mg/dL (ref 0.0–1.2)
BUN / CREAT RATIO: 21 (ref 12–28)
BUN: 19 mg/dL (ref 8–27)
CHLORIDE: 100 mmol/L (ref 96–106)
CO2: 22 mmol/L (ref 20–29)
Calcium: 10.2 mg/dL (ref 8.7–10.3)
Creatinine, Ser: 0.89 mg/dL (ref 0.57–1.00)
GFR calc Af Amer: 72 mL/min/{1.73_m2} (ref >59)
GFR calc non Af Amer: 62 mL/min/{1.73_m2} (ref >59)
GLOBULIN, TOTAL: 2.1 g/dL (ref 1.5–4.5)
Glucose: 89 mg/dL (ref 65–99)
POTASSIUM: 3.4 mmol/L — AB (ref 3.5–5.2)
SODIUM: 139 mmol/L (ref 134–144)
Total Protein: 6.4 g/dL (ref 6.0–8.5)

## 2017-07-04 LAB — CBC WITH DIFFERENTIAL/PLATELET
BASOS: 1 %
Basophils Absolute: 0.1 10*3/uL (ref 0.0–0.2)
EOS (ABSOLUTE): 0.1 10*3/uL (ref 0.0–0.4)
EOS: 2 %
HEMATOCRIT: 39.3 % (ref 34.0–46.6)
Hemoglobin: 12.9 g/dL (ref 11.1–15.9)
Immature Grans (Abs): 0 10*3/uL (ref 0.0–0.1)
Immature Granulocytes: 0 %
LYMPHS ABS: 0.9 10*3/uL (ref 0.7–3.1)
Lymphs: 23 %
MCH: 26.5 pg — ABNORMAL LOW (ref 26.6–33.0)
MCHC: 32.8 g/dL (ref 31.5–35.7)
MCV: 81 fL (ref 79–97)
Monocytes Absolute: 0.6 10*3/uL (ref 0.1–0.9)
Monocytes: 14 %
Neutrophils Absolute: 2.4 10*3/uL (ref 1.4–7.0)
Neutrophils: 60 %
PLATELETS: 127 10*3/uL — AB (ref 150–450)
RBC: 4.86 x10E6/uL (ref 3.77–5.28)
RDW: 15.9 % — AB (ref 12.3–15.4)
WBC: 4 10*3/uL (ref 3.4–10.8)

## 2017-07-04 LAB — LIPID PANEL
CHOLESTEROL TOTAL: 174 mg/dL (ref 100–199)
Chol/HDL Ratio: 2.5 ratio (ref 0.0–4.4)
HDL: 71 mg/dL (ref >39)
LDL Calculated: 78 mg/dL (ref 0–99)
Triglycerides: 123 mg/dL (ref 0–149)
VLDL Cholesterol Cal: 25 mg/dL (ref 5–40)

## 2017-07-04 LAB — TSH: TSH: 1.77 u[IU]/mL (ref 0.450–4.500)

## 2017-07-04 MED ORDER — VALACYCLOVIR HCL 500 MG PO TABS: 500.0000 mg | ORAL_TABLET | Freq: Two times a day (BID) | ORAL | 0 refills | Status: DC

## 2017-07-04 MED ORDER — VALACYCLOVIR HCL 1 G PO TABS
1000.0000 mg | ORAL_TABLET | Freq: Two times a day (BID) | ORAL | 0 refills | Status: AC
Start: 2017-07-04 — End: 2017-07-14

## 2017-07-04 NOTE — Patient Instructions (Addendum)
Spoke with infectious disease provider. He would like you to take:  1. Valtrex 1000 mg twice a day for 10 days, then.....decrease to 500 mg twice a day until advised differently by Dr. Rogue Bussing.   I have sent in 2 prescriptions for this medication. 1 for the 1 g tabs (10 day supply) and 1 for the 500 mg tabs (30 day supply)  Honor Loh, MSN, APRN, FNP-C, CEN Oncology/Hematology Nurse Practitioner  [place]

## 2017-07-04 NOTE — Progress Notes (Signed)
[place] OFFICE PROGRESS NOTE  Patient Care Team: [name], MD as PCP - General (Unknown Physician Specialty) Bary Castilla, Forest Gleason, MD (General Surgery) Forest Gleason, MD (Oncology) Corey Skains, MD as Consulting Physician (Cardiology) Anell Barr, OD as Consulting Physician (Optometry)  Cancer Staging No matching staging information was found for the patient.    Oncology History   1. Lymphoma low-grade, status Rituxan therapy. 2. Recurrent disease by clinical examination in December of 2012 3. Ovarian mass on PET scan in December of 2012 (normal CA 125). Ovarian mass has resolved after rituximab therapy. 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  Patient had total 5 cycles of chemotherapy with bendamustine and rituximab and 6 cycle was omitted because of significant side effects with and weakness and fatigue.  # OCT 2016- PET significant response; ON surveillance  # 18th OCT 2018- Gazyva;SEP 2018-/OCT 2018 CT scan- progression.   # 11/06/2016- colonoscopy [Dr.Byrnett- 2 polyps]  -------------------------------------------------------------------    # left ? Adnexal mass- ? Etiology; mild SUV uptake incidental [stable compared to previous PET in 2016] Previous workup 2015- vaginal ultrasound negative. MRI February 2018- approximately 3 cm in size; slow increase in the size of the left adnexal mass over at least 3 years- suspect involvement of lymphoma rather than a primary gynecologic malignancy.     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  female pleasant patient above history of Recurrent B-cell lymphoma Small cell type- currently status post cycle # 6  Gazyva for review of interval restaging studies and discussion regarding direction of therapy.  The patient was last seen by Dr. Rogue Bussing on 05/02/2017.  At that time, she denied any worsening adenopathy.  She received cycle #6.  She underwent restaging CT scans on 07/03/2017.  There was slight decrease in adenopathy.  The only lesion that had not decreased was the internal iliac nodal mass (4.3 x 3.5 cm).  During the interim, patient has developed HZV rash to RIGHT supraorbital area. Herpetic rash worsening since Friday. She has been seen by PCP and rash confirmed as "another case of shingles". 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 First outbreak was on her back, and the second rash was 3-4 years ago and occurred on the LEFT side of her face. Acyclovir dose has been at the 400 mg dose since last seen in the medical oncology by Dr. Rogue Bussing.   Patient denies that she has experienced any B symptoms. Patient maintains an adequate appetite, and notes that she is eating well. Weight, compared to her last visit to the clinic, has decreased by 5 pounds.   Patient denies pain in the clinic today.   REVIEW OF SYSTEMS:  Review of Systems  Constitutional: Positive for weight loss (down 5 pounds). Negative for diaphoresis, fever and malaise/fatigue.  HENT: Negative.   Eyes: Negative.   Respiratory: Negative for cough, hemoptysis, sputum production and shortness of breath.   Cardiovascular: Positive for leg swelling. Negative for chest pain, palpitations, orthopnea and PND.  Gastrointestinal: Negative for abdominal pain, blood in stool, constipation, diarrhea, melena, nausea and vomiting.  Genitourinary: Negative for dysuria, frequency, hematuria and urgency.  Musculoskeletal: Negative for back pain, falls, joint pain and  myalgias.  Skin: Positive for itching and rash (herpetic rash to RIGHT supraorbital area - does not cross midline - on chronic low dose acyclovir).        BILATERAL axillary adenopathy. Multiple cherry angiomas to anterior torso.   Neurological: Negative for dizziness, tremors, weakness and headaches.  Endo/Heme/Allergies: Does not bruise/bleed easily.  Psychiatric/Behavioral: Negative for depression, memory loss and suicidal ideas. The patient is not nervous/anxious and does not have insomnia.   All other systems reviewed and are negative.   PAST MEDICAL HISTORY :  Past Medical History:  Diagnosis Date  . A-fib ([place])   . Benign neoplasm of rectum and anal canal   . 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 Hypercholesteremia   . Hypertension   . Hyperthyroidism   . Lymphoma ([place]) 2007  . Non Hodgkin's lymphoma ([place]) 01/22/2006  . Personal history of chemotherapy     PAST SURGICAL HISTORY :   Past Surgical History:  Procedure Laterality Date  . ABDOMINAL HYSTERECTOMY  1958  . APPENDECTOMY    . BREAST BIOPSY Right 1970   right breast biopsy with clip- neg  . BREAST BIOPSY Left 2002   left breast biopsy with clip-neg  . BREAST BIOPSY Right 2014   right breast biopsy with clip-neg  . BREAST CYST ASPIRATION Bilateral 2000   bilateral fine needle aspiration  . BREAST SURGERY Left 1990   biopsy  . BREAST SURGERY Right May 08, 2012   complex fibroadenoma without malignancy.  [name] CARDIAC CATHETERIZATION  2014  . COLONOSCOPY  2009, 2012    hyperplastic rectal polyp 2012 tubular adenoma of proximal ascending colon 2000 9 repeat exam due to 2017.  [name] COLONOSCOPY WITH PROPOFOL N/A 11/06/2016   Procedure: COLONOSCOPY WITH PROPOFOL;  Surgeon: Robert Bellow, MD;  Location: [facility] ENDOSCOPY;  Service: Endoscopy;  Laterality: N/A;  . CORONARY ARTERY BYPASS GRAFT  1999  . FLEXIBLE SIGMOIDOSCOPY  1998    FAMILY HISTORY :   Family History  Problem Relation Age of Onset  . Lung cancer Father        Died age 53  . Heart disease Mother        Died age 67  . Asthma Mother   . CAD  Brother        double bypass  . Heart attack Sister   . Tuberculosis Sister        17 years old  . Lung cancer Sister        Died in her 29s  . Angina Sister   . Breast cancer Sister 58  . Rheum arthritis Sister   . Aneurysm Sister        Brain, died age 68  . Angina Sister   . Heart disease Brother        Stent placement  . Heart disease Brother        stent placement  . Cancer Other        breast cancer niece, pancreatic cancer niece  . Breast cancer Other   . Ovarian cancer Neg Hx   . Diabetes Neg Hx     SOCIAL HISTORY:   Social History   Tobacco Use  . Smoking status: Never Smoker  . Smokeless tobacco: Never Used  Substance Use Topics  . Alcohol use: Yes    Alcohol/week: 1.2 - 1.8 oz    Types: 1 - 2 Glasses of wine, 1 Shots of liquor per week  Comment: daily-   . Drug use: No    ALLERGIES:  is allergic to pravastatin.  MEDICATIONS:  Current Outpatient Medications  Medication Sig Dispense Refill  . acyclovir (ZOVIRAX) 400 MG tablet Take 1 tablet (400 mg total) by mouth daily. 90 tablet 5  . ezetimibe (ZETIA) 10 MG tablet Take 1 tablet (10 mg total) daily by mouth. 30 tablet 12  . isosorbide mononitrate (IMDUR) 30 MG 24 hr tablet Take by mouth.    . levothyroxine (SYNTHROID, LEVOTHROID) 88 MCG tablet TAKE 1 TABLET BY MOUTH  DAILY BEFORE BREAKFAST 90 tablet 3  . lisinopril-hydrochlorothiazide (PRINZIDE,ZESTORETIC) 20-12.5 MG tablet TAKE 1 TABLET BY MOUTH  DAILY 90 tablet 3  . Melatonin 1 MG CAPS Take 1 capsule by mouth at bedtime.     . metoprolol succinate (TOPROL-XL) 25 MG 24 hr tablet Take 25 mg by mouth daily.     Alveda Reasons 20 MG TABS tablet Take 20 mg by mouth daily with supper.      No current facility-administered medications for this visit.     PHYSICAL EXAMINATION: ECOG PERFORMANCE STATUS: 0 - Asymptomatic  BP (!) 145/77 (Patient Position: Sitting)   Pulse 80   Temp 97.9 F (36.6 C) (Tympanic)   Resp 18   Ht 5\' 5"  (1.651 m)   Wt 150 lb (68  kg)   BMI 24.96 kg/m   Filed Weights   07/04/17 1022  Weight: 150 lb (68 kg)    GENERAL:  Well developed, well nourished, woman sitting comfortably in the exam room in no acute distress. MENTAL STATUS:  Alert and oriented to person, place and time. HEAD:  Normocephalic, atraumatic, face symmetric, no Cushingoid features. EYES:  Pupils equal round and reactive to light and accomodation.  No conjunctivitis or scleral icterus. ENT:  Oropharynx clear without lesion.  Tongue normal. Mucous membranes moist.  RESPIRATORY:  Clear to auscultation without rales, wheezes or rhonchi. CARDIOVASCULAR:  Regular rate and rhythm without murmur, rub or gallop. ABDOMEN:  Soft, non-tender, with active bowel sounds, and no hepatosplenomegaly.  No masses. SKIN:  Right upper forehead rash c/w shingles.  No rashes, ulcers or lesions. EXTREMITIES: No edema, no skin discoloration or tenderness.  No palpable cords. LYMPH NODES: Small bilateral axillary nodes (left > right).  No palpable supraclavicular or inguinal adenopathy  NEUROLOGICAL: Unremarkable. PSYCH:  Appropriate.    LABORATORY DATA:  I have reviewed the data as listed    Component Value Date/Time   NA 139 07/03/2017 1425   NA 141 05/11/2014 0853   K 3.4 (L) 07/03/2017 1425   K 3.6 05/11/2014 0853   CL 100 07/03/2017 1425   CL 103 05/11/2014 0853   CO2 22 07/03/2017 1425   CO2 29 05/11/2014 0853   GLUCOSE 89 07/03/2017 1425   GLUCOSE 93 05/02/2017 0800   GLUCOSE 107 (H) 05/11/2014 0853   BUN 19 07/03/2017 1425   BUN 22 (H) 05/11/2014 0853   CREATININE 0.89 07/03/2017 1425   CREATININE 1.07 (H) 05/11/2014 0853   CALCIUM 10.2 07/03/2017 1425   CALCIUM 10.1 05/11/2014 0853   PROT 6.4 07/03/2017 1425   PROT 7.3 05/11/2014 0853   ALBUMIN 4.3 07/03/2017 1425   ALBUMIN 4.7 05/11/2014 0853   AST 34 07/03/2017 1425   AST 29 05/11/2014 0853   ALT 25 07/03/2017 1425   ALT 20 05/11/2014 0853   ALKPHOS 75 07/03/2017 1425   ALKPHOS 47  05/11/2014 0853   BILITOT 0.5 07/03/2017 1425   BILITOT 1.0 05/11/2014 0853  GFRNONAA 62 07/03/2017 1425   GFRNONAA 51 (L) 05/11/2014 0853   GFRAA 72 07/03/2017 1425   GFRAA 59 (L) 05/11/2014 0853    No results found for: SPEP, UPEP  Lab Results  Component Value Date   WBC 4.0 07/03/2017   NEUTROABS 2.4 07/03/2017   HGB 12.9 07/03/2017   HCT 39.3 07/03/2017   MCV 81 07/03/2017   PLT 127 (L) 07/03/2017      Chemistry      Component Value Date/Time   NA 139 07/03/2017 1425   NA 141 05/11/2014 0853   K 3.4 (L) 07/03/2017 1425   K 3.6 05/11/2014 0853   CL 100 07/03/2017 1425   CL 103 05/11/2014 0853   CO2 22 07/03/2017 1425   CO2 29 05/11/2014 0853   BUN 19 07/03/2017 1425   BUN 22 (H) 05/11/2014 0853   CREATININE 0.89 07/03/2017 1425   CREATININE 1.07 (H) 05/11/2014 0853   GLU 98 05/25/2013      Component Value Date/Time   CALCIUM 10.2 07/03/2017 1425   CALCIUM 10.1 05/11/2014 0853   ALKPHOS 75 07/03/2017 1425   ALKPHOS 47 05/11/2014 0853   AST 34 07/03/2017 1425   AST 29 05/11/2014 0853   ALT 25 07/03/2017 1425   ALT 20 05/11/2014 0853   BILITOT 0.5 07/03/2017 1425   BILITOT 1.0 05/11/2014 0853     : 1. No evidence of persistent small bowel intussusception. No definite small bowel neoplasm identified on enterography images. 2. Worsening lymphadenopathy throughout the abdomen and pelvis, concerning for recurrent lymphoma. 3. Circumferential thickening of the distal rectum with some borderline enlarged mesorectal lymph nodes. These findings are nonspecific, but correlation with sigmoidoscopy or colonoscopy should be considered if there is any clinical concern for underlying colorectal neoplasm. 4. Left adnexal mass measuring 3.3 x 4.2 x 3.8 cm, similar to prior examinations. 5. Cardiomegaly with biatrial dilatation. 6. Aortic atherosclerosis, in addition to least 2 vessel coronary artery disease. Assessment for potential risk factor modification, dietary  therapy or pharmacologic therapy may be warranted, if clinically indicated. 7. Additional incidental findings, as above.  Aortic Atherosclerosis (ICD10-I70.0).   Electronically Signed   By: Vinnie Langton M.D.   On: 10/22/2016 12:21   RADIOGRAPHIC STUDIES: I have personally reviewed the radiological images as listed and agreed with the findings in the report. Ct Chest W Contrast  Result Date: 07/03/2017 CLINICAL DATA:  Restaging lymphoma. EXAM: CT CHEST, ABDOMEN, AND PELVIS WITH CONTRAST TECHNIQUE: Multidetector CT imaging of the chest, abdomen and pelvis was performed following the standard protocol during bolus administration of intravenous contrast. CONTRAST:  154mL ISOVUE-300 IOPAMIDOL (ISOVUE-300) INJECTION 61% COMPARISON:  03/06/2017 FINDINGS: CT CHEST FINDINGS Cardiovascular: The heart is within normal limits in size for age and stable. Stable mild tortuosity and scattered calcifications of the thoracic aorta. The branch vessels are patent. Stable coronary artery calcifications and surgical changes from coronary artery bypass surgery. Mediastinum/Nodes: Interval decrease in size of the mediastinal and hilar lymph nodes. Pretracheal lymph node on image number 21 measures 9 mm and previously measured 11.5 mm. Precarinal lymph node on image number 29 measures 11.5 mm and previously measured 14.5 mm. 9.5 mm subcarinal lymph node on image number 34 previously measured 13 mm. Small bilateral hilar lymph nodes measuring less than 8 mm. Difficult to measure on the prior non contrasted CT scan. Lungs/Pleura: Biapical pleural and parenchymal scarring changes. Small scattered sub 4 mm peripheral pulmonary nodules are stable and likely small lymph nodes. No new or progressive  findings. No worrisome pulmonary lesions. No acute pulmonary findings. No pleural effusions. No pleural lesions. Musculoskeletal: No breast masses are identified. Slight improvement in bilateral axillary lymphadenopathy. 11 mm  short axis lymph node in the right axilla on image 20 previously measured 13.5 mm. 9.5 mm short axis right axilla lymph node on image number 19 previously measured 12.5 mm. 13 mm short axis left axillary lymph node on image 20 previously measured 16.5 mm. A few small supraclavicular lymph nodes are unchanged. No breast masses. No worrisome bone lesions. CT ABDOMEN PELVIS FINDINGS Hepatobiliary: Stable small hepatic cysts. No worrisome hepatic lesions or intrahepatic biliary dilatation. The gallbladder is unremarkable. No common bile duct dilatation. Pancreas: No mass, inflammation or ductal dilatation. Spleen: Normal size.  No focal lesions. Adrenals/Urinary Tract: The adrenal glands are normal in stable. Stable renal cortical thinning and scarring changes and small renal cysts but no worrisome renal lesions or hydronephrosis. The bladder is unremarkable. Stomach/Bowel: The stomach, duodenum, small bowel and colon are unremarkable. No acute inflammatory changes, mass lesions or obstructive findings. Vascular/Lymphatic: The aorta and branch vessels are patent. The major venous structures are patent. Numerous small scattered retroperitoneal lymph nodes are again demonstrated. Left-sided retroperitoneal lymph node on image number 73 measures 6.5 mm and previously measured 7.5 mm. Small inter aortocaval lymph nodes are unchanged. Left internal iliac nodal mass measures 4.3 x 3.5 cm on image number 103 and previously measured 4.3 x 3.2 cm. Right pelvic sidewall/operator lymph node on image number 113 measures 7 mm and previously measured 10 mm. 8.5 mm left inguinal lymph node on image number 118 previously measured 10 mm. Reproductive: Surgically absent. Other: No pelvic mass or adenopathy. No free pelvic fluid collections. No inguinal mass or adenopathy. No abdominal wall hernia or subcutaneous lesions. Musculoskeletal: No worrisome bone lesions. Stable appearing bilateral hip musculature lipomas. IMPRESSION: 1. Overall  slight decrease in size of the patient's lymphadenopathy when compared to prior study as specifically detailed above. The only lesion that has not regressed is the internal iliac nodal mass on the left side. 2. No new or progressive findings are identified. Electronically Signed   By: Marijo Sanes M.D.   On: 07/03/2017 12:08   Ct Abdomen Pelvis W Contrast  Result Date: 07/03/2017 CLINICAL DATA:  Restaging lymphoma. EXAM: CT CHEST, ABDOMEN, AND PELVIS WITH CONTRAST TECHNIQUE: Multidetector CT imaging of the chest, abdomen and pelvis was performed following the standard protocol during bolus administration of intravenous contrast. CONTRAST:  113mL ISOVUE-300 IOPAMIDOL (ISOVUE-300) INJECTION 61% COMPARISON:  03/06/2017 FINDINGS: CT CHEST FINDINGS Cardiovascular: The heart is within normal limits in size for age and stable. Stable mild tortuosity and scattered calcifications of the thoracic aorta. The branch vessels are patent. Stable coronary artery calcifications and surgical changes from coronary artery bypass surgery. Mediastinum/Nodes: Interval decrease in size of the mediastinal and hilar lymph nodes. Pretracheal lymph node on image number 21 measures 9 mm and previously measured 11.5 mm. Precarinal lymph node on image number 29 measures 11.5 mm and previously measured 14.5 mm. 9.5 mm subcarinal lymph node on image number 34 previously measured 13 mm. Small bilateral hilar lymph nodes measuring less than 8 mm. Difficult to measure on the prior non contrasted CT scan. Lungs/Pleura: Biapical pleural and parenchymal scarring changes. Small scattered sub 4 mm peripheral pulmonary nodules are stable and likely small lymph nodes. No new or progressive findings. No worrisome pulmonary lesions. No acute pulmonary findings. No pleural effusions. No pleural lesions. Musculoskeletal: No breast masses are identified. Slight  improvement in bilateral axillary lymphadenopathy. 11 mm short axis lymph node in the right axilla  on image 20 previously measured 13.5 mm. 9.5 mm short axis right axilla lymph node on image number 19 previously measured 12.5 mm. 13 mm short axis left axillary lymph node on image 20 previously measured 16.5 mm. A few small supraclavicular lymph nodes are unchanged. No breast masses. No worrisome bone lesions. CT ABDOMEN PELVIS FINDINGS Hepatobiliary: Stable small hepatic cysts. No worrisome hepatic lesions or intrahepatic biliary dilatation. The gallbladder is unremarkable. No common bile duct dilatation. Pancreas: No mass, inflammation or ductal dilatation. Spleen: Normal size.  No focal lesions. Adrenals/Urinary Tract: The adrenal glands are normal in stable. Stable renal cortical thinning and scarring changes and small renal cysts but no worrisome renal lesions or hydronephrosis. The bladder is unremarkable. Stomach/Bowel: The stomach, duodenum, small bowel and colon are unremarkable. No acute inflammatory changes, mass lesions or obstructive findings. Vascular/Lymphatic: The aorta and branch vessels are patent. The major venous structures are patent. Numerous small scattered retroperitoneal lymph nodes are again demonstrated. Left-sided retroperitoneal lymph node on image number 73 measures 6.5 mm and previously measured 7.5 mm. Small inter aortocaval lymph nodes are unchanged. Left internal iliac nodal mass measures 4.3 x 3.5 cm on image number 103 and previously measured 4.3 x 3.2 cm. Right pelvic sidewall/operator lymph node on image number 113 measures 7 mm and previously measured 10 mm. 8.5 mm left inguinal lymph node on image number 118 previously measured 10 mm. Reproductive: Surgically absent. Other: No pelvic mass or adenopathy. No free pelvic fluid collections. No inguinal mass or adenopathy. No abdominal wall hernia or subcutaneous lesions. Musculoskeletal: No worrisome bone lesions. Stable appearing bilateral hip musculature lipomas. IMPRESSION: 1. Overall slight decrease in size of the patient's  lymphadenopathy when compared to prior study as specifically detailed above. The only lesion that has not regressed is the internal iliac nodal mass on the left side. 2. No new or progressive findings are identified. Electronically Signed   By: Marijo Sanes M.D.   On: 07/03/2017 12:08     ASSESSMENT & PLAN:  SHYANNE MCCLARY 77 y.o.  Female with recurrent B-cell lymphoma Small cell type- currently status post cycle # 6 Gazyva.  Rstaging CT scans on 07/03/2017 revealed a slight decrease in adenopathy.  The only lesion that had not decreased was the internal iliac nodal mass (4.3 x 3.5 cm).  She has shingles in the right trigeminal region.  1. Review interval CT imaging of the abdomen and pelvis.  Images personally reviewed. 2. Discuss lymphoma diagnosis. Review indications for treatment with would include B symptoms, anemia, thrombocytopenia, progressive adenopathy. Discussed potential change in therapy to oral chemotherapy (ibrutinib) when treatment needed.  Patient currently asymptomatic. 3. Discuss herpetic rash. Patient is on daily acyclovir 400 mg. Discuss referral to infectious disease. Dr. Ola Spurr unable to see patient today, but offered recommendations on treatment with Valtrex. Of note, antiviral therapy requires dose reduction due to reduced CrCl of 46.9 mL/min. Patient to be treated as follows:  Valtrex 1 g PO BID x 10 days, then 500 mg BID until further advised by primary oncologist.  4. RTC on 07/15/2017 for MD assessment (Dr. Rogue Bussing) and labs (CBC with diff, CMP).   All questions were answered. The patient knows to call the clinic with any problems, questions or concerns.     Honor Loh, NP 07/04/2017 10:55 AM   I saw and evaluated the patient, participating in the key portions of the service  and reviewing pertinent diagnostic studies and records.  I reviewed the nurse practitioner's note and agree with the findings and the plan.  The assessment and plan were discussed with  the patient. Multiple questions were asked by the patient and answered.   Nolon Stalls, MD 07/04/2017 10:55 AM

## 2017-07-04 NOTE — Progress Notes (Signed)
Patient diagnosed with shingles on forehead-right lateral. Patient had labs performed with pcp-Dr. [name] yesterday. (labs on chart).

## 2017-07-05 ENCOUNTER — Other Ambulatory Visit: Payer: Self-pay

## 2017-07-05 ENCOUNTER — Telehealth: Payer: Self-pay | Admitting: *Deleted

## 2017-07-05 MED ORDER — LEVOTHYROXINE SODIUM 88 MCG PO TABS: ORAL_TABLET | ORAL | 3 refills | Status: DC

## 2017-07-05 NOTE — Progress Notes (Signed)
Advised  ED 

## 2017-07-05 NOTE — Telephone Encounter (Signed)
Patient Advocate Encounter  We found the GoodRx discount card brought her out of pocket down significantly for Valtrex.  Spoke to [place] to try card and copay is $55 for both strengths of medication.    We are mailing out a copy of the card information to take with her to the pharmacy.  She will check with [facility] next week after she gets the information in the mail.  We have also included a free trial card for Xarelto and an assistance application that she can fill out to see if she qualifies to get medication for no copay.   [place] Patient [place] Phone [phone] Fax [phone] 07/05/2017 3:42 PM

## 2017-07-05 NOTE — Telephone Encounter (Signed)
The Valcyclovir order will cost her $400 out of pocket. She already pays a lot for Xarelto and cannot afford the Acyclovir. She is asking if there is anything else she can take because she will NOT be getting this one. Please advise

## 2017-07-11 ENCOUNTER — Other Ambulatory Visit: Payer: Self-pay | Admitting: *Deleted

## 2017-07-11 DIAGNOSIS — C8308 Small cell B-cell lymphoma, lymph nodes of multiple sites: Secondary | ICD-10-CM

## 2017-07-15 ENCOUNTER — Encounter: Payer: Self-pay | Admitting: Internal Medicine

## 2017-07-15 ENCOUNTER — Other Ambulatory Visit: Payer: Medicare Other

## 2017-07-15 ENCOUNTER — Inpatient Hospital Stay: Payer: Medicare Other

## 2017-07-15 ENCOUNTER — Ambulatory Visit: Payer: Medicare Other | Admitting: Hematology and Oncology

## 2017-07-15 ENCOUNTER — Other Ambulatory Visit: Payer: Self-pay

## 2017-07-15 ENCOUNTER — Inpatient Hospital Stay (HOSPITAL_BASED_OUTPATIENT_CLINIC_OR_DEPARTMENT_OTHER): Payer: Medicare Other | Admitting: Internal Medicine

## 2017-07-15 VITALS — BP 144/88 | HR 80 | Temp 97.6°F | Resp 20

## 2017-07-15 DIAGNOSIS — C8308 Small cell B-cell lymphoma, lymph nodes of multiple sites: Secondary | ICD-10-CM | POA: Diagnosis not present

## 2017-07-15 DIAGNOSIS — I10 Essential (primary) hypertension: Secondary | ICD-10-CM | POA: Diagnosis not present

## 2017-07-15 DIAGNOSIS — N949 Unspecified condition associated with female genital organs and menstrual cycle: Secondary | ICD-10-CM | POA: Diagnosis not present

## 2017-07-15 DIAGNOSIS — D696 Thrombocytopenia, unspecified: Secondary | ICD-10-CM | POA: Diagnosis not present

## 2017-07-15 DIAGNOSIS — I482 Chronic atrial fibrillation: Secondary | ICD-10-CM | POA: Diagnosis not present

## 2017-07-15 DIAGNOSIS — E782 Mixed hyperlipidemia: Secondary | ICD-10-CM | POA: Diagnosis not present

## 2017-07-15 DIAGNOSIS — I872 Venous insufficiency (chronic) (peripheral): Secondary | ICD-10-CM | POA: Diagnosis not present

## 2017-07-15 DIAGNOSIS — B029 Zoster without complications: Secondary | ICD-10-CM | POA: Diagnosis not present

## 2017-07-15 DIAGNOSIS — I25810 Atherosclerosis of coronary artery bypass graft(s) without angina pectoris: Secondary | ICD-10-CM | POA: Diagnosis not present

## 2017-07-15 LAB — COMPREHENSIVE METABOLIC PANEL
ALT: 32 U/L (ref 14–54)
AST: 41 U/L (ref 15–41)
Albumin: 4.1 g/dL (ref 3.5–5.0)
Alkaline Phosphatase: 71 U/L (ref 38–126)
Anion gap: 9 (ref 5–15)
BUN: 26 mg/dL — ABNORMAL HIGH (ref 6–20)
CO2: 22 mmol/L (ref 22–32)
Calcium: 9.6 mg/dL (ref 8.9–10.3)
Chloride: 105 mmol/L (ref 101–111)
Creatinine, Ser: 1.04 mg/dL — ABNORMAL HIGH (ref 0.44–1.00)
GFR calc Af Amer: 58 mL/min — ABNORMAL LOW (ref >60)
GFR calc non Af Amer: 50 mL/min — ABNORMAL LOW (ref >60)
Glucose, Bld: 102 mg/dL — ABNORMAL HIGH (ref 65–99)
Potassium: 4.1 mmol/L (ref 3.5–5.1)
Sodium: 136 mmol/L (ref 135–145)
Total Bilirubin: 0.8 mg/dL (ref 0.3–1.2)
Total Protein: 6.8 g/dL (ref 6.5–8.1)

## 2017-07-15 LAB — CBC WITH DIFFERENTIAL/PLATELET
Basophils Absolute: 0.1 10*3/uL (ref 0–0.1)
Basophils Relative: 2 %
Eosinophils Absolute: 0.1 10*3/uL (ref 0–0.7)
Eosinophils Relative: 3 %
HCT: 35.7 % (ref 35.0–47.0)
Hemoglobin: 11.8 g/dL — ABNORMAL LOW (ref 12.0–16.0)
Lymphocytes Relative: 21 %
Lymphs Abs: 0.8 10*3/uL — ABNORMAL LOW (ref 1.0–3.6)
MCH: 26.8 pg (ref 26.0–34.0)
MCHC: 33 g/dL (ref 32.0–36.0)
MCV: 81.2 fL (ref 80.0–100.0)
Monocytes Absolute: 0.4 10*3/uL (ref 0.2–0.9)
Monocytes Relative: 11 %
Neutro Abs: 2.4 10*3/uL (ref 1.4–6.5)
Neutrophils Relative %: 63 %
Platelets: 122 10*3/uL — ABNORMAL LOW (ref 150–440)
RBC: 4.39 MIL/uL (ref 3.80–5.20)
RDW: 16.7 % — ABNORMAL HIGH (ref 11.5–14.5)
WBC: 3.7 10*3/uL (ref 3.6–11.0)

## 2017-07-15 NOTE — Progress Notes (Signed)
[place] OFFICE PROGRESS NOTE  Patient Care Team: [name], MD as PCP - General (Unknown Physician Specialty) Bary Castilla, Forest Gleason, MD (General Surgery) Forest Gleason, MD (Inactive) (Oncology) Corey Skains, MD as Consulting Physician (Cardiology) Anell Barr, OD as Consulting Physician (Optometry)  Cancer Staging No matching staging information was found for the patient.   Oncology History   1. Lymphoma low-grade, status Rituxan therapy. 2. Recurrent disease by clinical examination in December of 2012 3. Ovarian mass on PET scan in December of 2012 (normal CA 125). Ovarian mass has resolved after rituximab therapy. 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  Patient had total 5 cycles of chemotherapy with bendamustine and rituximab and 6 cycle was omitted because of significant side effects with and weakness and fatigue.  # OCT 2016- PET significant response; ON surveillance  # 18th OCT 2018- Gazyva;SEP 2018-/OCT 2018 CT scan- progression.   # 11/06/2016- colonoscopy [Dr.Byrnett- 2 polyps]  -------------------------------------------------------------------    # left ? Adnexal mass- ? Etiology; mild SUV uptake incidental [stable compared to previous PET in 2016] Previous workup 2015- vaginal ultrasound negative. MRI February 2018- approximately 3 cm in size; slow increase in the size of the left adnexal mass over at least 3 years- suspect involvement of lymphoma rather than a primary gynecologic malignancy. 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  female pleasant patient above history of SLL status post 6 cycles of Dyann Kief is here to review the results of her restaging CAT scan.  Patient had a recent episode of shingles for which she has been treated with Valtrex.  Rash is resolved.  No pain.  No nausea no vomiting.  No swelling in the legs.  Review of Systems  Constitutional: Negative for chills, diaphoresis, fever, malaise/fatigue and weight loss.  HENT: Negative for nosebleeds and sore throat.   Eyes: Negative for double vision.  Respiratory: Negative for cough, hemoptysis, sputum production, shortness of breath and wheezing.   Cardiovascular: Negative for chest pain, palpitations, orthopnea and leg swelling.  Gastrointestinal: Negative for abdominal pain, blood in stool, constipation, diarrhea, heartburn, melena, nausea and vomiting.  Genitourinary: Negative for dysuria, frequency and urgency.  Musculoskeletal: Negative for back pain and joint pain.  Skin: Negative.  Negative for itching and rash.  Neurological: Negative for dizziness, tingling, focal weakness, weakness and headaches.  Endo/Heme/Allergies: Does not bruise/bleed easily.  Psychiatric/Behavioral: Negative for depression. The patient is not nervous/anxious and does not have insomnia.       PAST MEDICAL HISTORY :  Past Medical History:  Diagnosis Date  . A-fib ([place])   . Benign neoplasm of rectum and anal canal   . 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 Hypercholesteremia   . Hypertension   . Hyperthyroidism   . Lymphoma ([place]) 2007  . Non Hodgkin's lymphoma ([place]) 01/22/2006  . Personal history of chemotherapy     PAST SURGICAL HISTORY :   Past Surgical History:  Procedure Laterality Date  .  ABDOMINAL HYSTERECTOMY  1958  . APPENDECTOMY    . BREAST BIOPSY Right 1970   right breast  biopsy with clip- neg  . BREAST BIOPSY Left 2002   left breast biopsy with clip-neg  . BREAST BIOPSY Right 2014   right breast biopsy with clip-neg  . BREAST CYST ASPIRATION Bilateral 2000   bilateral fine needle aspiration  . BREAST SURGERY Left 1990   biopsy  . BREAST SURGERY Right May 08, 2012   complex fibroadenoma without malignancy.  [name] CARDIAC CATHETERIZATION  2014  . COLONOSCOPY  2009, 2012    hyperplastic rectal polyp 2012 tubular adenoma of proximal ascending colon 2000 9 repeat exam due to 2017.  [name] COLONOSCOPY WITH PROPOFOL N/A 11/06/2016   Procedure: COLONOSCOPY WITH PROPOFOL;  Surgeon: Robert Bellow, MD;  Location: [facility] ENDOSCOPY;  Service: Endoscopy;  Laterality: N/A;  . CORONARY ARTERY BYPASS GRAFT  1999  . FLEXIBLE SIGMOIDOSCOPY  1998    FAMILY HISTORY :   Family History  Problem Relation Age of Onset  . Lung cancer Father        Died age 66  . Heart disease Mother        Died age 55  . Asthma Mother   . CAD Brother        double bypass  . Heart attack Sister   . Tuberculosis Sister        41 years old  . Lung cancer Sister        Died in her 69s  . Angina Sister   . Breast cancer Sister 41  . Rheum arthritis Sister   . Aneurysm Sister        Brain, died age 33  . Angina Sister   . Heart disease Brother        Stent placement  . Heart disease Brother        stent placement  . Cancer Other        breast cancer niece, pancreatic cancer niece  . Breast cancer Other   . Ovarian cancer Neg Hx   . Diabetes Neg Hx     SOCIAL HISTORY:   Social History   Tobacco Use  . Smoking status: Never Smoker  . Smokeless tobacco: Never Used  Substance Use Topics  . Alcohol use: Yes    Alcohol/week: 1.2 - 1.8 oz    Types: 1 - 2 Glasses of wine, 1 Shots of liquor per week    Comment: daily-   . Drug use: No    ALLERGIES:  is allergic to pravastatin.  MEDICATIONS:  Current Outpatient Medications  Medication Sig Dispense Refill  . ezetimibe (ZETIA)  10 MG tablet Take 1 tablet (10 mg total) daily by mouth. 30 tablet 12  . isosorbide mononitrate (IMDUR) 30 MG 24 hr tablet Take by mouth.    . levothyroxine (SYNTHROID, LEVOTHROID) 88 MCG tablet TAKE 1 TABLET BY MOUTH  DAILY BEFORE BREAKFAST 90 tablet 3  . lisinopril-hydrochlorothiazide (PRINZIDE,ZESTORETIC) 20-12.5 MG tablet TAKE 1 TABLET BY MOUTH  DAILY 90 tablet 3  . Melatonin 1 MG CAPS Take 1 capsule by mouth at bedtime.     . metoprolol succinate (TOPROL-XL) 25 MG 24 hr tablet Take 25 mg by mouth daily.     . valACYclovir (VALTREX) 500 MG tablet Take 1 tablet (500 mg total) by mouth 2 (two) times daily. 60 tablet 0  . XARELTO 20 MG TABS tablet Take 20 mg by mouth daily with supper.     [name]  acyclovir (ZOVIRAX) 400 MG tablet Take 1 tablet (400 mg total) by mouth daily. (Patient not taking: Reported on 07/15/2017) 90 tablet 5   No current facility-administered medications for this visit.     PHYSICAL EXAMINATION: ECOG PERFORMANCE STATUS: 1 - Symptomatic but completely ambulatory  BP (!) 144/88   Pulse 80   Temp 97.6 F (36.4 C) (Tympanic)   Resp 20   There were no vitals filed for this visit.  GENERAL: Well-nourished well-developed; Alert, no distress and comfortable.  Accompanied by family.  EYES: no pallor or icterus OROPHARYNX: no thrush or ulceration; NECK: supple; no lymph nodes felt. LYMPH:  no palpable lymphadenopathy in  inguinal regions.  Mild 1 to 2 cm lymph node felt in the bilateral axillary region. LUNGS: Decreased breath sounds auscultation bilaterally. No wheeze or crackles HEART/CVS: regular rate & rhythm and no murmurs; No lower extremity edema ABDOMEN:abdomen soft, non-tender and normal bowel sounds. No hepatomegaly or splenomegaly.  Musculoskeletal:no cyanosis of digits and no clubbing  PSYCH: alert & oriented x 3 with fluent speech NEURO: no focal motor/sensory deficits SKIN:  no rashes or significant lesions    LABORATORY DATA:  I have reviewed the data as  listed    Component Value Date/Time   NA 136 07/15/2017 1004   NA 139 07/03/2017 1425   NA 141 05/11/2014 0853   K 4.1 07/15/2017 1004   K 3.6 05/11/2014 0853   CL 105 07/15/2017 1004   CL 103 05/11/2014 0853   CO2 22 07/15/2017 1004   CO2 29 05/11/2014 0853   GLUCOSE 102 (H) 07/15/2017 1004   GLUCOSE 107 (H) 05/11/2014 0853   BUN 26 (H) 07/15/2017 1004   BUN 19 07/03/2017 1425   BUN 22 (H) 05/11/2014 0853   CREATININE 1.04 (H) 07/15/2017 1004   CREATININE 1.07 (H) 05/11/2014 0853   CALCIUM 9.6 07/15/2017 1004   CALCIUM 10.1 05/11/2014 0853   PROT 6.8 07/15/2017 1004   PROT 6.4 07/03/2017 1425   PROT 7.3 05/11/2014 0853   ALBUMIN 4.1 07/15/2017 1004   ALBUMIN 4.3 07/03/2017 1425   ALBUMIN 4.7 05/11/2014 0853   AST 41 07/15/2017 1004   AST 29 05/11/2014 0853   ALT 32 07/15/2017 1004   ALT 20 05/11/2014 0853   ALKPHOS 71 07/15/2017 1004   ALKPHOS 47 05/11/2014 0853   BILITOT 0.8 07/15/2017 1004   BILITOT 0.5 07/03/2017 1425   BILITOT 1.0 05/11/2014 0853   GFRNONAA 50 (L) 07/15/2017 1004   GFRNONAA 51 (L) 05/11/2014 0853   GFRAA 58 (L) 07/15/2017 1004   GFRAA 59 (L) 05/11/2014 0853    No results found for: SPEP, UPEP  Lab Results  Component Value Date   WBC 3.7 07/15/2017   NEUTROABS 2.4 07/15/2017   HGB 11.8 (L) 07/15/2017   HCT 35.7 07/15/2017   MCV 81.2 07/15/2017   PLT 122 (L) 07/15/2017      Chemistry      Component Value Date/Time   NA 136 07/15/2017 1004   NA 139 07/03/2017 1425   NA 141 05/11/2014 0853   K 4.1 07/15/2017 1004   K 3.6 05/11/2014 0853   CL 105 07/15/2017 1004   CL 103 05/11/2014 0853   CO2 22 07/15/2017 1004   CO2 29 05/11/2014 0853   BUN 26 (H) 07/15/2017 1004   BUN 19 07/03/2017 1425   BUN 22 (H) 05/11/2014 0853   CREATININE 1.04 (H) 07/15/2017 1004   CREATININE 1.07 (H) 05/11/2014 0853   GLU 98 05/25/2013  Component Value Date/Time   CALCIUM 9.6 07/15/2017 1004   CALCIUM 10.1 05/11/2014 0853   ALKPHOS 71 07/15/2017  1004   ALKPHOS 47 05/11/2014 0853   AST 41 07/15/2017 1004   AST 29 05/11/2014 0853   ALT 32 07/15/2017 1004   ALT 20 05/11/2014 0853   BILITOT 0.8 07/15/2017 1004   BILITOT 0.5 07/03/2017 1425   BILITOT 1.0 05/11/2014 0853       RADIOGRAPHIC STUDIES: I have personally reviewed the radiological images as listed and agreed with the findings in the report. No results found.   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  Except for approximately 4 cm soft tissue mass in the pelvis [see discussion below]  #From SLL standpoint I would recommend surveillance.  We will hold off any further therapy at this time.  #Left pelvic mass-likely left adnexal cyst-previous MRI PET scan reviewed also reviewed the tumor conference.  Likely benign.  Monitor for now.  Recent shingles#status post Valtrex-recommend shingles prophylaxis post treatment.  # Thrombocytopenia-122 sec to Gazyva; asymptomatic; stable.  On Xarelto no bleeding.  # follow up  In 2 months/labs-CT scans prior.  Patient to call us if any worsening lymphadenopathy.  # follow up in 2 months/port flush/labs-  Addendum: Imaging reviewed at the tumor conference left pelvic soft tissue mass likely adnexal cyst.    Orders Placed This Encounter  Procedures  . Comprehensive metabolic panel    Standing Status:   Future    Standing Expiration Date:   07/16/2018  . CBC with Differential    Standing Status:   Future    Standing Expiration Date:   07/16/2018   All questions were answered. The patient knows to call the clinic with any problems, questions or concerns.      Cammie Sickle, MD 07/21/2017 5:14 PM

## 2017-07-15 NOTE — Assessment & Plan Note (Addendum)
#   Small B-cell lymphocytic lymphoma low-grade- stage IV; status post 6 cycles of Gazyva CT scan shows improved response in the chest under arm and inguinal in the pelvic lymph nodes.  Except for approximately 4 cm soft tissue mass in the pelvis [see discussion below]  #From SLL standpoint I would recommend surveillance.  We will hold off any further therapy at this time.  #Left pelvic mass-likely left adnexal cyst-previous MRI PET scan reviewed also reviewed the tumor conference.  Likely benign.  Monitor for now.  Recent shingles#status post Valtrex-recommend shingles prophylaxis post treatment.  # Thrombocytopenia-122 sec to Gazyva; asymptomatic; stable.  On Xarelto no bleeding.  # follow up  In 2 months/labs-CT scans prior.  Patient to call us if any worsening lymphadenopathy.  # follow up in 2 months/port flush/labs-  Addendum: Imaging reviewed at the tumor conference left pelvic soft tissue mass likely adnexal cyst.

## 2017-07-15 NOTE — Progress Notes (Signed)
Patient presents to clinic in follow-up for shingles.

## 2017-08-21 ENCOUNTER — Telehealth: Payer: Self-pay | Admitting: Family Medicine

## 2017-08-21 NOTE — Telephone Encounter (Signed)
I left a message asking the pt to call me at [phone] to schedule AWV w/ [facility]. VDM (DD) °

## 2017-09-16 ENCOUNTER — Other Ambulatory Visit: Payer: Self-pay

## 2017-09-16 ENCOUNTER — Inpatient Hospital Stay: Payer: Medicare Other | Attending: Internal Medicine | Admitting: Internal Medicine

## 2017-09-16 ENCOUNTER — Inpatient Hospital Stay: Payer: Medicare Other

## 2017-09-16 ENCOUNTER — Encounter: Payer: Self-pay | Admitting: Internal Medicine

## 2017-09-16 VITALS — BP 173/63 | HR 84 | Temp 97.6°F | Resp 20 | Ht 65.0 in | Wt 152.0 lb

## 2017-09-16 DIAGNOSIS — R599 Enlarged lymph nodes, unspecified: Secondary | ICD-10-CM | POA: Diagnosis not present

## 2017-09-16 DIAGNOSIS — R1900 Intra-abdominal and pelvic swelling, mass and lump, unspecified site: Secondary | ICD-10-CM

## 2017-09-16 DIAGNOSIS — D6959 Other secondary thrombocytopenia: Secondary | ICD-10-CM | POA: Diagnosis not present

## 2017-09-16 DIAGNOSIS — R21 Rash and other nonspecific skin eruption: Secondary | ICD-10-CM | POA: Diagnosis not present

## 2017-09-16 DIAGNOSIS — M7989 Other specified soft tissue disorders: Secondary | ICD-10-CM

## 2017-09-16 DIAGNOSIS — C8308 Small cell B-cell lymphoma, lymph nodes of multiple sites: Secondary | ICD-10-CM | POA: Diagnosis not present

## 2017-09-16 LAB — CBC WITH DIFFERENTIAL/PLATELET
Basophils Absolute: 0.1 10*3/uL (ref 0–0.1)
Basophils Relative: 1 %
Eosinophils Absolute: 0.1 10*3/uL (ref 0–0.7)
Eosinophils Relative: 2 %
HEMATOCRIT: 40.4 % (ref 35.0–47.0)
Hemoglobin: 13.2 g/dL (ref 12.0–16.0)
LYMPHS ABS: 1 10*3/uL (ref 1.0–3.6)
LYMPHS PCT: 22 %
MCH: 27 pg (ref 26.0–34.0)
MCHC: 32.6 g/dL (ref 32.0–36.0)
MCV: 82.7 fL (ref 80.0–100.0)
MONO ABS: 0.4 10*3/uL (ref 0.2–0.9)
Monocytes Relative: 9 %
NEUTROS ABS: 3.1 10*3/uL (ref 1.4–6.5)
Neutrophils Relative %: 66 %
Platelets: 131 10*3/uL — ABNORMAL LOW (ref 150–440)
RBC: 4.89 MIL/uL (ref 3.80–5.20)
RDW: 17 % — AB (ref 11.5–14.5)
WBC: 4.7 10*3/uL (ref 3.6–11.0)

## 2017-09-16 LAB — COMPREHENSIVE METABOLIC PANEL
ALT: 28 U/L (ref 0–44)
AST: 37 U/L (ref 15–41)
Albumin: 4.4 g/dL (ref 3.5–5.0)
Alkaline Phosphatase: 71 U/L (ref 38–126)
Anion gap: 11 (ref 5–15)
BILIRUBIN TOTAL: 0.9 mg/dL (ref 0.3–1.2)
BUN: 21 mg/dL (ref 8–23)
CO2: 25 mmol/L (ref 22–32)
CREATININE: 0.97 mg/dL (ref 0.44–1.00)
Calcium: 10.2 mg/dL (ref 8.9–10.3)
Chloride: 104 mmol/L (ref 98–111)
GFR calc Af Amer: >60 mL/min (ref >60)
GFR, EST NON AFRICAN AMERICAN: 55 mL/min — AB (ref >60)
Glucose, Bld: 96 mg/dL (ref 70–99)
Potassium: 3.6 mmol/L (ref 3.5–5.1)
Sodium: 140 mmol/L (ref 135–145)
TOTAL PROTEIN: 7.1 g/dL (ref 6.5–8.1)

## 2017-09-16 NOTE — Assessment & Plan Note (Addendum)
#   Small B-cell lymphocytic lymphoma low-grade- stage IV; status post 6 cycles of Gazyva-June 2019 CT scan shows improved response in the chest under arm and inguinal in the pelvic lymph nodes.  Except for approximately 4 cm soft tissue mass in the pelvis [see discussion below]; stable.  #Stable/slightly increased left underarm lymph node-clinically no significant symptoms.  This is likely from SLL.  However I would recommend surveillance.  We will hold off any further therapy at this time- discussed re: revlimid-rituxan; rituxan- venatoclax; ibrutinib/acalburitnib.   # Left axillary LN ~2cm/ slightly larger- follow up for now.  See above.  Will get imaging if growing significantly larger.  # Left pelvic mass-likely left adnexal cyst-previous MRI PET scan reviewed also reviewed the tumor conference.  Likely benign.   # Hx of shingles- can come off acyclovir; given the intermittent skin rash is reasonable to come off acyclovir.  # Skin rash- face/UE bil; itchy on claritin; defer to dermatology.   # Thrombocytopenia-122 sec to Gazyva; asymptomatic; stable.  On Xarelto no bleeding.  # follow up in 2 months/port flush/labs; will order CT scan at that time.

## 2017-09-16 NOTE — Progress Notes (Signed)
[facility] OFFICE PROGRESS NOTE  Patient Care Team: [name], MD as PCP - General (Unknown Physician Specialty) Bary Castilla, Forest Gleason, MD (General Surgery) Forest Gleason, MD (Inactive) (Oncology) Corey Skains, MD as Consulting Physician (Cardiology) Anell Barr, OD as Consulting Physician (Optometry)  Cancer Staging No matching staging information was found for the patient.   Oncology History   1. Lymphoma low-grade, status Rituxan therapy. 2. Recurrent disease by clinical examination in December of 2012 3. Ovarian mass on PET scan in December of 2012 (normal CA 125). Ovarian mass has resolved after rituximab therapy. 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  Patient had total 5 cycles of chemotherapy with bendamustine and rituximab and 6 cycle was omitted because of significant side effects with and weakness and fatigue.  # OCT 2016- PET significant response; ON surveillance  # 18th OCT 2018- Gazyva;SEP 2018-/OCT 2018 CT scan- progression.   # 11/06/2016- colonoscopy [Dr.Byrnett- 2 polyps]  -------------------------------------------------------------------    # left ? Adnexal mass- ? Etiology; mild SUV uptake incidental [stable compared to previous PET in 2016] Previous workup 2015- vaginal ultrasound negative. MRI February 2018- approximately 3 cm in size; slow increase in the size of the left adnexal mass over at least 3 years- suspect involvement of lymphoma rather than a primary gynecologic malignancy. 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  Patient complains of vague discomfort in the left underarm second to her lymph nodes.  Otherwise denies any significant night sweats.  Denies any loss of appetite or nausea vomiting abdominal pain.  Complains of intermittent skin rash on the face; upper extremity's.  Chronic swelling in the legs left more than right.  Not any worse.  Review of Systems  Constitutional: Positive for malaise/fatigue. Negative for chills, diaphoresis, fever and weight loss.  HENT: Negative for nosebleeds and sore throat.   Eyes: Negative for double vision.  Respiratory: Negative for cough, hemoptysis, sputum production, shortness of breath and wheezing.   Cardiovascular: Positive for leg swelling. Negative for chest pain, palpitations and orthopnea.  Gastrointestinal: Negative for abdominal pain, blood in stool, constipation, diarrhea, heartburn, melena, nausea and vomiting.  Genitourinary: Negative for dysuria, frequency and urgency.  Musculoskeletal: Negative for back pain and joint pain.  Skin: Positive for itching and rash.  Neurological: Negative for dizziness, tingling, focal weakness, weakness and headaches.  Endo/Heme/Allergies: Does not bruise/bleed easily.  Psychiatric/Behavioral: Negative for depression. The patient is not nervous/anxious and does not have insomnia.       PAST MEDICAL HISTORY :  Past Medical History:  Diagnosis Date  . A-fib ([place])   . Benign neoplasm of rectum and anal canal   . 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 Hypercholesteremia   . Hypertension   . Hyperthyroidism   . Lymphoma ([place]) 2007  . Non Hodgkin's lymphoma ([place]) 01/22/2006  . Personal history of chemotherapy  PAST SURGICAL HISTORY :    Past Surgical History:  Procedure Laterality Date  . ABDOMINAL HYSTERECTOMY  1958  . APPENDECTOMY    . BREAST BIOPSY Right 1970   right breast biopsy with clip- neg  . BREAST BIOPSY Left 2002   left breast biopsy with clip-neg  . BREAST BIOPSY Right 2014   right breast biopsy with clip-neg  . BREAST CYST ASPIRATION Bilateral 2000   bilateral fine needle aspiration  . BREAST SURGERY Left 1990   biopsy  . BREAST SURGERY Right May 08, 2012   complex fibroadenoma without malignancy.  [name] CARDIAC CATHETERIZATION  2014  . COLONOSCOPY  2009, 2012    hyperplastic rectal polyp 2012 tubular adenoma of proximal ascending colon 2000 9 repeat exam due to 2017.  [name] COLONOSCOPY WITH PROPOFOL N/A 11/06/2016   Procedure: COLONOSCOPY WITH PROPOFOL;  Surgeon: Robert Bellow, MD;  Location: [facility] ENDOSCOPY;  Service: Endoscopy;  Laterality: N/A;  . CORONARY ARTERY BYPASS GRAFT  1999  . FLEXIBLE SIGMOIDOSCOPY  1998    FAMILY HISTORY :   Family History  Problem Relation Age of Onset  . Lung cancer Father        Died age 46  . Heart disease Mother        Died age 4  . Asthma Mother   . CAD Brother        double bypass  . Heart attack Sister   . Tuberculosis Sister        33 years old  . Lung cancer Sister        Died in her 73s  . Angina Sister   . Breast cancer Sister 23  . Rheum arthritis Sister   . Aneurysm Sister        Brain, died age 20  . Angina Sister   . Heart disease Brother        Stent placement  . Heart disease Brother        stent placement  . Cancer Other        breast cancer niece, pancreatic cancer niece  . Breast cancer Other   . Ovarian cancer Neg Hx   . Diabetes Neg Hx     SOCIAL HISTORY:   Social History   Tobacco Use  . Smoking status: Never Smoker  . Smokeless tobacco: Never Used  Substance Use Topics  . Alcohol use: Yes    Alcohol/week: 2.0 - 3.0 standard drinks    Types: 1 - 2 Glasses of wine, 1 Shots of liquor per week    Comment: daily-   .  Drug use: No    ALLERGIES:  is allergic to pravastatin.  MEDICATIONS:  Current Outpatient Medications  Medication Sig Dispense Refill  . acyclovir (ZOVIRAX) 400 MG tablet Take 1 tablet (400 mg total) by mouth daily. 90 tablet 5  . ezetimibe (ZETIA) 10 MG tablet Take 1 tablet (10 mg total) daily by mouth. 30 tablet 12  . isosorbide mononitrate (IMDUR) 30 MG 24 hr tablet Take 30 mg by mouth daily.     [name] levothyroxine (SYNTHROID, LEVOTHROID) 88 MCG tablet TAKE 1 TABLET BY MOUTH  DAILY BEFORE BREAKFAST 90 tablet 3  . lisinopril-hydrochlorothiazide (PRINZIDE,ZESTORETIC) 20-12.5 MG tablet TAKE 1 TABLET BY MOUTH  DAILY 90 tablet 3  . Melatonin 1 MG CAPS Take 1 capsule by mouth at bedtime.     . metoprolol succinate (TOPROL-XL) 25 MG 24 hr tablet Take 25 mg by mouth daily.     Alveda Reasons  20 MG TABS tablet Take 20 mg by mouth daily with supper.      No current facility-administered medications for this visit.     PHYSICAL EXAMINATION: ECOG PERFORMANCE STATUS: 1 - Symptomatic but completely ambulatory  BP (!) 173/63 (BP Location: Left Arm, Patient Position: Sitting)   Pulse 84   Temp 97.6 F (36.4 C) (Tympanic)   Resp 20   Ht 5\' 5"  (1.651 m)   Wt 152 lb (68.9 kg)   BMI 25.29 kg/m   Filed Weights   09/16/17 1102  Weight: 152 lb (68.9 kg)    Physical Exam  Constitutional: She is oriented to person, place, and time and well-developed, well-nourished, and in no distress.  HENT:  Head: Normocephalic and atraumatic.  Mouth/Throat: Oropharynx is clear and moist. No oropharyngeal exudate.  Eyes: Pupils are equal, round, and reactive to light.  Neck: Normal range of motion. Neck supple.  Cardiovascular: Normal rate and regular rhythm.  Pulmonary/Chest: No respiratory distress. She has no wheezes.  Abdominal: Soft. Bowel sounds are normal. She exhibits no distension and no mass. There is no tenderness. There is no rebound and no guarding.  Musculoskeletal: Normal range of motion. She  exhibits no edema or tenderness.  Lymphadenopathy:  Approximately 2 cm lymph node felt in the left  underarm.  1 cm lymph node felt in the right underarm.;  None felt in the bilateral inguinal regions.  Neurological: She is alert and oriented to person, place, and time.  Skin: Skin is warm.  Papules scattered face/upper extremities.  Psychiatric: Affect normal.      LABORATORY DATA:  I have reviewed the data as listed    Component Value Date/Time   NA 140 09/16/2017 1016   NA 139 07/03/2017 1425   NA 141 05/11/2014 0853   K 3.6 09/16/2017 1016   K 3.6 05/11/2014 0853   CL 104 09/16/2017 1016   CL 103 05/11/2014 0853   CO2 25 09/16/2017 1016   CO2 29 05/11/2014 0853   GLUCOSE 96 09/16/2017 1016   GLUCOSE 107 (H) 05/11/2014 0853   BUN 21 09/16/2017 1016   BUN 19 07/03/2017 1425   BUN 22 (H) 05/11/2014 0853   CREATININE 0.97 09/16/2017 1016   CREATININE 1.07 (H) 05/11/2014 0853   CALCIUM 10.2 09/16/2017 1016   CALCIUM 10.1 05/11/2014 0853   PROT 7.1 09/16/2017 1016   PROT 6.4 07/03/2017 1425   PROT 7.3 05/11/2014 0853   ALBUMIN 4.4 09/16/2017 1016   ALBUMIN 4.3 07/03/2017 1425   ALBUMIN 4.7 05/11/2014 0853   AST 37 09/16/2017 1016   AST 29 05/11/2014 0853   ALT 28 09/16/2017 1016   ALT 20 05/11/2014 0853   ALKPHOS 71 09/16/2017 1016   ALKPHOS 47 05/11/2014 0853   BILITOT 0.9 09/16/2017 1016   BILITOT 0.5 07/03/2017 1425   BILITOT 1.0 05/11/2014 0853   GFRNONAA 55 (L) 09/16/2017 1016   GFRNONAA 51 (L) 05/11/2014 0853   GFRAA >60 09/16/2017 1016   GFRAA 59 (L) 05/11/2014 0853    No results found for: SPEP, UPEP  Lab Results  Component Value Date   WBC 4.7 09/16/2017   NEUTROABS 3.1 09/16/2017   HGB 13.2 09/16/2017   HCT 40.4 09/16/2017   MCV 82.7 09/16/2017   PLT 131 (L) 09/16/2017      Chemistry      Component Value Date/Time   NA 140 09/16/2017 1016   NA 139 07/03/2017 1425   NA 141 05/11/2014 0853   K 3.6  09/16/2017 1016   K 3.6 05/11/2014 0853    CL 104 09/16/2017 1016   CL 103 05/11/2014 0853   CO2 25 09/16/2017 1016   CO2 29 05/11/2014 0853   BUN 21 09/16/2017 1016   BUN 19 07/03/2017 1425   BUN 22 (H) 05/11/2014 0853   CREATININE 0.97 09/16/2017 1016   CREATININE 1.07 (H) 05/11/2014 0853   GLU 98 05/25/2013      Component Value Date/Time   CALCIUM 10.2 09/16/2017 1016   CALCIUM 10.1 05/11/2014 0853   ALKPHOS 71 09/16/2017 1016   ALKPHOS 47 05/11/2014 0853   AST 37 09/16/2017 1016   AST 29 05/11/2014 0853   ALT 28 09/16/2017 1016   ALT 20 05/11/2014 0853   BILITOT 0.9 09/16/2017 1016   BILITOT 0.5 07/03/2017 1425   BILITOT 1.0 05/11/2014 0853       RADIOGRAPHIC STUDIES: I have personally reviewed the radiological images as listed and agreed with the findings in the report. No results found.   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  Except for approximately 4 cm soft tissue mass in the pelvis [see discussion below]; stable.  #Stable/slightly increased left underarm lymph node-clinically no significant symptoms.  This is likely from SLL.  However I would recommend surveillance.  We will hold off any further therapy at this time- discussed re: revlimid-rituxan; rituxan- venatoclax; ibrutinib/acalburitnib.   # Left axillary LN ~2cm/ slightly larger- follow up for now.  See above.  Will get imaging if growing significantly larger.  # Left pelvic mass-likely left adnexal cyst-previous MRI PET scan reviewed also reviewed the tumor conference.  Likely benign.   # Hx of shingles- can come off acyclovir; given the intermittent skin rash is reasonable to come off acyclovir.  # Skin rash- face/UE bil; itchy on claritin; defer to dermatology.   # Thrombocytopenia-122 sec to Gazyva;  asymptomatic; stable.  On Xarelto no bleeding.  # follow up in 2 months/port flush/labs; will order CT scan at that time.     Orders Placed This Encounter  Procedures  . CBC with Differential/Platelet    Standing Status:   Future    Standing Expiration Date:   09/17/2018  . Comprehensive metabolic panel    Standing Status:   Future    Standing Expiration Date:   09/17/2018   All questions were answered. The patient knows to call the clinic with any problems, questions or concerns.      Cammie Sickle, MD 09/16/2017 2:02 PM

## 2017-11-18 ENCOUNTER — Inpatient Hospital Stay (HOSPITAL_BASED_OUTPATIENT_CLINIC_OR_DEPARTMENT_OTHER): Payer: Medicare Other | Admitting: Internal Medicine

## 2017-11-18 ENCOUNTER — Inpatient Hospital Stay: Payer: Medicare Other | Attending: Internal Medicine

## 2017-11-18 ENCOUNTER — Encounter: Payer: Self-pay | Admitting: Internal Medicine

## 2017-11-18 VITALS — BP 127/79 | HR 97 | Temp 97.4°F | Resp 16 | Wt 147.2 lb

## 2017-11-18 DIAGNOSIS — C8308 Small cell B-cell lymphoma, lymph nodes of multiple sites: Secondary | ICD-10-CM

## 2017-11-18 DIAGNOSIS — R1900 Intra-abdominal and pelvic swelling, mass and lump, unspecified site: Secondary | ICD-10-CM

## 2017-11-18 DIAGNOSIS — R599 Enlarged lymph nodes, unspecified: Secondary | ICD-10-CM

## 2017-11-18 DIAGNOSIS — Z7901 Long term (current) use of anticoagulants: Secondary | ICD-10-CM

## 2017-11-18 DIAGNOSIS — R21 Rash and other nonspecific skin eruption: Secondary | ICD-10-CM | POA: Insufficient documentation

## 2017-11-18 DIAGNOSIS — I4891 Unspecified atrial fibrillation: Secondary | ICD-10-CM | POA: Insufficient documentation

## 2017-11-18 DIAGNOSIS — D6959 Other secondary thrombocytopenia: Secondary | ICD-10-CM | POA: Diagnosis not present

## 2017-11-18 DIAGNOSIS — Z79899 Other long term (current) drug therapy: Secondary | ICD-10-CM | POA: Insufficient documentation

## 2017-11-18 LAB — COMPREHENSIVE METABOLIC PANEL
ALT: 17 U/L (ref 0–44)
AST: 24 U/L (ref 15–41)
Albumin: 3.9 g/dL (ref 3.5–5.0)
Alkaline Phosphatase: 59 U/L (ref 38–126)
Anion gap: 8 (ref 5–15)
BILIRUBIN TOTAL: 1 mg/dL (ref 0.3–1.2)
BUN: 21 mg/dL (ref 8–23)
CO2: 24 mmol/L (ref 22–32)
CREATININE: 0.97 mg/dL (ref 0.44–1.00)
Calcium: 10.1 mg/dL (ref 8.9–10.3)
Chloride: 105 mmol/L (ref 98–111)
GFR, EST NON AFRICAN AMERICAN: 54 mL/min — AB (ref >60)
Glucose, Bld: 111 mg/dL — ABNORMAL HIGH (ref 70–99)
POTASSIUM: 3.7 mmol/L (ref 3.5–5.1)
Sodium: 137 mmol/L (ref 135–145)
TOTAL PROTEIN: 6.6 g/dL (ref 6.5–8.1)

## 2017-11-18 LAB — CBC WITH DIFFERENTIAL/PLATELET
Abs Immature Granulocytes: 0 10*3/uL (ref 0.00–0.07)
BASOS PCT: 1 %
Basophils Absolute: 0 10*3/uL (ref 0.0–0.1)
EOS ABS: 0.1 10*3/uL (ref 0.0–0.5)
Eosinophils Relative: 2 %
HCT: 39.9 % (ref 36.0–46.0)
Hemoglobin: 12.7 g/dL (ref 12.0–15.0)
Immature Granulocytes: 0 %
Lymphocytes Relative: 24 %
Lymphs Abs: 0.9 10*3/uL (ref 0.7–4.0)
MCH: 26.2 pg (ref 26.0–34.0)
MCHC: 31.8 g/dL (ref 30.0–36.0)
MCV: 82.3 fL (ref 80.0–100.0)
MONO ABS: 0.4 10*3/uL (ref 0.1–1.0)
Monocytes Relative: 11 %
NEUTROS ABS: 2.4 10*3/uL (ref 1.7–7.7)
Neutrophils Relative %: 62 %
PLATELETS: 110 10*3/uL — AB (ref 150–400)
RBC: 4.85 MIL/uL (ref 3.87–5.11)
RDW: 14.6 % (ref 11.5–15.5)
WBC: 3.9 10*3/uL — AB (ref 4.0–10.5)
nRBC: 0 % (ref 0.0–0.2)

## 2017-11-18 NOTE — Assessment & Plan Note (Addendum)
#   Small B-cell lymphocytic lymphoma low-grade- stage IV; status post 6 cycles of Gazyva-June 2019 CT scan shows improved response in the chest under arm and inguinal in the pelvic lymph nodes.  Except for approximately 4 cm soft tissue mass in the pelvis [see discussion below]; clinically stable.  #Stable/slightly increased left underarm lymph node-clinically no significant symptoms.  This is likely from SLL.  Continue surveillance.  I would recommend a repeat imaging in approximately 2 months.  If progressive disease noted patient could be candidate for treatment with rituxan-Ven vs. Ibrutinib.   # Left axillary LN ~2cm/ slightly larger- follow up for now.  See above.  Will get imaging if growing significantly larger.  # Left pelvic mass-likely left adnexal cyst; stable benign.  # Skin rash- face/UE bil; itchy on claritin; defer to dermatology.   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  #A. Fib-stable continue Xarelto.   # follow up in 2 months/port flush/labs; C/A/P- CT scan-Dr.B

## 2017-11-18 NOTE — Progress Notes (Signed)
**Note Jacqueline Webb via Obfuscation** [place] OFFICE PROGRESS NOTE  Patient Care Team: [name], MD as PCP - General (Unknown Physician Specialty) Bary Castilla, Forest Gleason, MD (General Surgery) Forest Gleason, MD (Inactive) (Oncology) Corey Skains, MD as Consulting Physician (Cardiology) Anell Barr, OD as Consulting Physician (Optometry)  Cancer Staging No matching staging information was found for the patient.   Oncology History   1. Lymphoma low-grade, status Rituxan therapy. 2. Recurrent disease by clinical examination in December of 2012 3. Ovarian mass on PET scan in December of 2012 (normal CA 125). Ovarian mass has resolved after rituximab therapy. 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  Patient had total 5 cycles of chemotherapy with bendamustine and rituximab and 6 cycle was omitted because of significant side effects with and weakness and fatigue.  # OCT 2016- PET significant response; ON surveillance  # 18th OCT 2018- Gazyva;SEP 2018-/OCT 2018 CT scan- progression.   # 11/06/2016- colonoscopy [Dr.Byrnett- 2 polyps]  -------------------------------------------------------------------    # left ? Adnexal mass- ? Etiology; mild SUV uptake incidental [stable compared to previous PET in 2016] Previous workup 2015- vaginal ultrasound negative. MRI February 2018- approximately 3 cm in size; slow increase in the size of the left adnexal mass over at least 3 years- suspect involvement of lymphoma rather than a primary gynecologic malignancy. 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  Patient overall doing well except notes to have intermittent discomfort in the left underarm second lymph nodes.  No night sweats.  No weight loss or abdominal pain or constipation.   Complains of intermittent skin rash on the face; upper extremity's.  Previously well with dermatology.  Review of Systems  Constitutional: Positive for malaise/fatigue. Negative for chills, diaphoresis, fever and weight loss.  HENT: Negative for nosebleeds and sore throat.   Eyes: Negative for double vision.  Respiratory: Negative for cough, hemoptysis, sputum production, shortness of breath and wheezing.   Cardiovascular: Positive for leg swelling. Negative for chest pain, palpitations and orthopnea.  Gastrointestinal: Negative for abdominal pain, blood in stool, constipation, diarrhea, heartburn, melena, nausea and vomiting.  Genitourinary: Negative for dysuria, frequency and urgency.  Musculoskeletal: Negative for back pain and joint pain.  Skin: Positive for itching and rash.  Neurological: Negative for dizziness, tingling, focal weakness, weakness and headaches.  Endo/Heme/Allergies: Does not bruise/bleed easily.  Psychiatric/Behavioral: Negative for depression. The patient is not nervous/anxious and does not have insomnia.       PAST MEDICAL HISTORY :  Past Medical History:  Diagnosis Date  . A-fib ([place])   . Benign neoplasm of rectum and anal canal   . 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 Hypercholesteremia   . Hypertension   . Hyperthyroidism   . Lymphoma ([place]) 2007  . Non Hodgkin's lymphoma ([place]) 01/22/2006  . Personal history of chemotherapy     PAST SURGICAL HISTORY :   Past Surgical History:  Procedure Laterality  Date  . ABDOMINAL HYSTERECTOMY  1958  . APPENDECTOMY    . BREAST BIOPSY Right 1970   right breast biopsy with clip- neg  . BREAST BIOPSY Left 2002   left breast biopsy with clip-neg  . BREAST BIOPSY Right 2014   right breast biopsy with clip-neg  . BREAST CYST ASPIRATION Bilateral 2000   bilateral fine needle aspiration  . BREAST SURGERY Left 1990   biopsy  . BREAST SURGERY Right May 08, 2012   complex fibroadenoma without malignancy.  [name] CARDIAC CATHETERIZATION  2014  . COLONOSCOPY  2009, 2012    hyperplastic rectal polyp 2012 tubular adenoma of proximal ascending colon 2000 9 repeat exam due to 2017.  [name] COLONOSCOPY WITH PROPOFOL N/A 11/06/2016   Procedure: COLONOSCOPY WITH PROPOFOL;  Surgeon: Robert Bellow, MD;  Location: [facility] ENDOSCOPY;  Service: Endoscopy;  Laterality: N/A;  . CORONARY ARTERY BYPASS GRAFT  1999  . FLEXIBLE SIGMOIDOSCOPY  1998    FAMILY HISTORY :   Family History  Problem Relation Age of Onset  . Lung cancer Father        Died age 78  . Heart disease Mother        Died age 44  . Asthma Mother   . CAD Brother        double bypass  . Heart attack Sister   . Tuberculosis Sister        60 years old  . Lung cancer Sister        Died in her 84s  . Angina Sister   . Breast cancer Sister 30  . Rheum arthritis Sister   . Aneurysm Sister        Brain, died age 86  . Angina Sister   . Heart disease Brother        Stent placement  . Heart disease Brother        stent placement  . Cancer Other        breast cancer niece, pancreatic cancer niece  . Breast cancer Other   . Ovarian cancer Neg Hx   . Diabetes Neg Hx     SOCIAL HISTORY:   Social History   Tobacco Use  . Smoking status: Never Smoker  . Smokeless tobacco: Never Used  Substance Use Topics  . Alcohol use: Yes    Alcohol/week: 2.0 - 3.0 standard drinks    Types: 1 - 2 Glasses of wine, 1 Shots of liquor per week    Comment: daily-   . Drug use: No    ALLERGIES:  is allergic to  pravastatin.  MEDICATIONS:  Current Outpatient Medications  Medication Sig Dispense Refill  . acyclovir (ZOVIRAX) 400 MG tablet Take 1 tablet (400 mg total) by mouth daily. 90 tablet 5  . ezetimibe (ZETIA) 10 MG tablet Take 1 tablet (10 mg total) daily by mouth. 30 tablet 12  . isosorbide mononitrate (IMDUR) 30 MG 24 hr tablet Take 30 mg by mouth daily.     [name] levothyroxine (SYNTHROID, LEVOTHROID) 88 MCG tablet TAKE 1 TABLET BY MOUTH  DAILY BEFORE BREAKFAST 90 tablet 3  . lisinopril-hydrochlorothiazide (PRINZIDE,ZESTORETIC) 20-12.5 MG tablet TAKE 1 TABLET BY MOUTH  DAILY 90 tablet 3  . Melatonin 1 MG CAPS Take 1 capsule by mouth at bedtime.     . metoprolol succinate (TOPROL-XL) 25 MG 24 hr tablet Take 25 mg by mouth daily.     Alveda Reasons 20 MG TABS tablet Take 20 mg by mouth daily  with supper.      No current facility-administered medications for this visit.     PHYSICAL EXAMINATION: ECOG PERFORMANCE STATUS: 1 - Symptomatic but completely ambulatory  BP 127/79 (BP Location: Left Arm, Patient Position: Sitting)   Pulse 97   Temp (!) 97.4 F (36.3 C) (Tympanic)   Resp 16   Wt 147 lb 3.2 oz (66.8 kg)   BMI 24.50 kg/m   Filed Weights   11/18/17 1043  Weight: 147 lb 3.2 oz (66.8 kg)    Physical Exam  Constitutional: She is oriented to person, place, and time and well-developed, well-nourished, and in no distress.  HENT:  Head: Normocephalic and atraumatic.  Mouth/Throat: Oropharynx is clear and moist. No oropharyngeal exudate.  Eyes: Pupils are equal, round, and reactive to light.  Neck: Normal range of motion. Neck supple.  Cardiovascular: Normal rate and regular rhythm.  Pulmonary/Chest: No respiratory distress. She has no wheezes.  Abdominal: Soft. Bowel sounds are normal. She exhibits no distension and no mass. There is no tenderness. There is no rebound and no guarding.  Musculoskeletal: Normal range of motion. She exhibits no edema or tenderness.  Lymphadenopathy:   Approximately 2 cm lymph node felt in the left  underarm.  1 cm lymph node felt in the right underarm.;  None felt in the bilateral inguinal regions.  Neurological: She is alert and oriented to person, place, and time.  Skin: Skin is warm.  Papules scattered face/upper extremities.  Psychiatric: Affect normal.      LABORATORY DATA:  I have reviewed the data as listed    Component Value Date/Time   NA 137 11/18/2017 1026   NA 139 07/03/2017 1425   NA 141 05/11/2014 0853   K 3.7 11/18/2017 1026   K 3.6 05/11/2014 0853   CL 105 11/18/2017 1026   CL 103 05/11/2014 0853   CO2 24 11/18/2017 1026   CO2 29 05/11/2014 0853   GLUCOSE 111 (H) 11/18/2017 1026   GLUCOSE 107 (H) 05/11/2014 0853   BUN 21 11/18/2017 1026   BUN 19 07/03/2017 1425   BUN 22 (H) 05/11/2014 0853   CREATININE 0.97 11/18/2017 1026   CREATININE 1.07 (H) 05/11/2014 0853   CALCIUM 10.1 11/18/2017 1026   CALCIUM 10.1 05/11/2014 0853   PROT 6.6 11/18/2017 1026   PROT 6.4 07/03/2017 1425   PROT 7.3 05/11/2014 0853   ALBUMIN 3.9 11/18/2017 1026   ALBUMIN 4.3 07/03/2017 1425   ALBUMIN 4.7 05/11/2014 0853   AST 24 11/18/2017 1026   AST 29 05/11/2014 0853   ALT 17 11/18/2017 1026   ALT 20 05/11/2014 0853   ALKPHOS 59 11/18/2017 1026   ALKPHOS 47 05/11/2014 0853   BILITOT 1.0 11/18/2017 1026   BILITOT 0.5 07/03/2017 1425   BILITOT 1.0 05/11/2014 0853   GFRNONAA 54 (L) 11/18/2017 1026   GFRNONAA 51 (L) 05/11/2014 0853   GFRAA >60 11/18/2017 1026   GFRAA 59 (L) 05/11/2014 0853    No results found for: SPEP, UPEP  Lab Results  Component Value Date   WBC 3.9 (L) 11/18/2017   NEUTROABS 2.4 11/18/2017   HGB 12.7 11/18/2017   HCT 39.9 11/18/2017   MCV 82.3 11/18/2017   PLT 110 (L) 11/18/2017      Chemistry      Component Value Date/Time   NA 137 11/18/2017 1026   NA 139 07/03/2017 1425   NA 141 05/11/2014 0853   K 3.7 11/18/2017 1026   K 3.6 05/11/2014 0853   CL  105 11/18/2017 1026   CL 103  05/11/2014 0853   CO2 24 11/18/2017 1026   CO2 29 05/11/2014 0853   BUN 21 11/18/2017 1026   BUN 19 07/03/2017 1425   BUN 22 (H) 05/11/2014 0853   CREATININE 0.97 11/18/2017 1026   CREATININE 1.07 (H) 05/11/2014 0853   GLU 98 05/25/2013      Component Value Date/Time   CALCIUM 10.1 11/18/2017 1026   CALCIUM 10.1 05/11/2014 0853   ALKPHOS 59 11/18/2017 1026   ALKPHOS 47 05/11/2014 0853   AST 24 11/18/2017 1026   AST 29 05/11/2014 0853   ALT 17 11/18/2017 1026   ALT 20 05/11/2014 0853   BILITOT 1.0 11/18/2017 1026   BILITOT 0.5 07/03/2017 1425   BILITOT 1.0 05/11/2014 0853       RADIOGRAPHIC STUDIES: I have personally reviewed the radiological images as listed and agreed with the findings in the report. No results found.   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  Except for approximately 4 cm soft tissue mass in the pelvis [see discussion below]; clinically stable.  #Stable/slightly increased left underarm lymph node-clinically no significant symptoms.  This is likely from SLL.  Continue surveillance.  I would recommend a repeat imaging in approximately 2 months.  If progressive disease noted patient could be candidate for treatment with rituxan-Ven vs. Ibrutinib.   # Left axillary LN ~2cm/ slightly larger- follow up for now.  See above.  Will get imaging if growing significantly larger.  # Left pelvic mass-likely left adnexal cyst; stable benign.  # Skin rash- face/UE bil; itchy on claritin; defer to dermatology.   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  #A. Fib-stable continue Xarelto.   # follow up in 2 months/port flush/labs; C/A/P- CT scan-Dr.B   Orders Placed This Encounter  Procedures  . CT CHEST W CONTRAST     Standing Status:   Future    Standing Expiration Date:   11/19/2018    Order Specific Question:   If indicated for the ordered procedure, I authorize the administration of contrast media per Radiology protocol    Answer:   Yes    Order Specific Question:   Preferred imaging location?    Answer:   [facility]    Order Specific Question:   Radiology Contrast Protocol - do NOT remove file path    Answer:   \\[email or web address]    Order Specific Question:   ** REASON FOR EXAM (FREE TEXT)    Answer:   small cell lymphoma  . CT Abdomen Pelvis W Contrast    Standing Status:   Future    Standing Expiration Date:   11/18/2018    Order Specific Question:   ** REASON FOR EXAM (FREE TEXT)    Answer:   small cell lymphoma    Order Specific Question:   If indicated for the ordered procedure, I authorize the administration of contrast media per Radiology protocol    Answer:   Yes    Order Specific Question:   Preferred imaging location?    Answer:   [facility]    Order Specific Question:   Is Oral Contrast requested for this exam?    Answer:   Yes, Per Radiology protocol    Order Specific Question:  Radiology Contrast Protocol - do NOT remove file path    Answer:   \\[email or web address]   All questions were answered. The patient knows to call the clinic with any problems, questions or concerns.      Cammie Sickle, MD 11/19/2017 2:04 PM

## 2018-01-06 ENCOUNTER — Ambulatory Visit (INDEPENDENT_AMBULATORY_CARE_PROVIDER_SITE_OTHER): Payer: Medicare Other | Admitting: Family Medicine

## 2018-01-06 ENCOUNTER — Encounter: Payer: Self-pay | Admitting: Family Medicine

## 2018-01-06 VITALS — BP 128/68 | HR 88 | Temp 98.2°F | Resp 16 | Ht 65.0 in | Wt 151.0 lb

## 2018-01-06 DIAGNOSIS — I4819 Other persistent atrial fibrillation: Secondary | ICD-10-CM

## 2018-01-06 DIAGNOSIS — I10 Essential (primary) hypertension: Secondary | ICD-10-CM | POA: Diagnosis not present

## 2018-01-06 DIAGNOSIS — E039 Hypothyroidism, unspecified: Secondary | ICD-10-CM

## 2018-01-06 DIAGNOSIS — E7800 Pure hypercholesterolemia, unspecified: Secondary | ICD-10-CM

## 2018-01-06 DIAGNOSIS — C8308 Small cell B-cell lymphoma, lymph nodes of multiple sites: Secondary | ICD-10-CM | POA: Diagnosis not present

## 2018-01-06 DIAGNOSIS — Z23 Encounter for immunization: Secondary | ICD-10-CM

## 2018-01-06 DIAGNOSIS — I25810 Atherosclerosis of coronary artery bypass graft(s) without angina pectoris: Secondary | ICD-10-CM

## 2018-01-06 MED ORDER — OXYBUTYNIN CHLORIDE ER 5 MG PO TB24: 5.0000 mg | ORAL_TABLET | Freq: Two times a day (BID) | ORAL | 0 refills | Status: DC

## 2018-01-06 NOTE — Progress Notes (Signed)
Patient: Jacqueline Webb Female    DOB: 08/07/1940   77 y.o.   MRN: [id number] Visit Date: 01/06/2018  Today's Provider: [name], MD   Chief Complaint  Patient presents with  . Follow-up   Subjective:     HPI  Patient comes in today a 6 month follow up. She feels well today with no complaints.  Hypothyroidism- No changes were made in her medications. She is currently taking Levothyroxine 66mcg daily.   Afib- No changes were made. She is currently taking Xarelto 20mg  daily.   She is worried about the B-cell lymphoma she has followed by oncology.  She has a small amount of axillary adenopathy which concerns her.  Allergies  Allergen Reactions  . Pravastatin Other (See Comments)     Current Outpatient Medications:  .  acyclovir (ZOVIRAX) 400 MG tablet, Take 1 tablet (400 mg total) by mouth daily., Disp: 90 tablet, Rfl: 5 .  ezetimibe (ZETIA) 10 MG tablet, Take 1 tablet (10 mg total) daily by mouth., Disp: 30 tablet, Rfl: 12 .  isosorbide mononitrate (IMDUR) 30 MG 24 hr tablet, Take 30 mg by mouth daily. , Disp: , Rfl:  .  levothyroxine (SYNTHROID, LEVOTHROID) 88 MCG tablet, TAKE 1 TABLET BY MOUTH  DAILY BEFORE BREAKFAST, Disp: 90 tablet, Rfl: 3 .  lisinopril-hydrochlorothiazide (PRINZIDE,ZESTORETIC) 20-12.5 MG tablet, TAKE 1 TABLET BY MOUTH  DAILY, Disp: 90 tablet, Rfl: 3 .  Melatonin 1 MG CAPS, Take 1 capsule by mouth at bedtime. , Disp: , Rfl:  .  metoprolol succinate (TOPROL-XL) 25 MG 24 hr tablet, Take 25 mg by mouth daily. , Disp: , Rfl:  .  XARELTO 20 MG TABS tablet, Take 20 mg by mouth daily with supper. , Disp: , Rfl:   Review of Systems  Constitutional: Negative.   Eyes: Negative.   Respiratory: Negative.   Cardiovascular: Negative.   Endocrine: Negative.   Musculoskeletal: Negative.   Allergic/Immunologic: Negative.   Neurological: Negative.   Hematological: Negative.   Psychiatric/Behavioral: Negative.     Social History   Tobacco  Use  . Smoking status: Never Smoker  . Smokeless tobacco: Never Used  Substance Use Topics  . Alcohol use: Yes    Alcohol/week: 2.0 - 3.0 standard drinks    Types: 1 - 2 Glasses of wine, 1 Shots of liquor per week    Comment: daily-       Objective:   BP 128/68 (BP Location: Left Arm, Patient Position: Sitting, Cuff Size: Normal)   Pulse 88   Temp 98.2 F (36.8 C)   Resp 16   Ht 5\' 5"  (1.651 m)   Wt 151 lb (68.5 kg)   SpO2 96%   BMI 25.13 kg/m  Vitals:   01/06/18 1104  BP: 128/68  Pulse: 88  Resp: 16  Temp: 98.2 F (36.8 C)  SpO2: 96%  Weight: 151 lb (68.5 kg)  Height: 5\' 5"  (1.651 m)     Physical Exam Constitutional:      Appearance: She is well-developed.  HENT:     Head: Normocephalic and atraumatic.     Right Ear: External ear normal.     Left Ear: External ear normal.     Nose: Nose normal.  Eyes:     General: No scleral icterus.    Conjunctiva/sclera: Conjunctivae normal.  Neck:     Thyroid: No thyromegaly.  Cardiovascular:     Rate and Rhythm: Normal rate and regular rhythm.  Heart sounds: Normal heart sounds.  Pulmonary:     Effort: Pulmonary effort is normal.     Breath sounds: Normal breath sounds.     Comments: She has small mobile nontender lymph nodes in both axilla. Abdominal:     Palpations: Abdomen is soft.  Skin:    General: Skin is warm and dry.     Comments: Rash over right forehead,vesicular--c/w shingle.  Neurological:     General: No focal deficit present.     Mental Status: She is alert and oriented to person, place, and time.  Psychiatric:        Mood and Affect: Mood normal.        Behavior: Behavior normal.        Thought Content: Thought content normal.        Judgment: Judgment normal.         Assessment & Plan    1. Adult hypothyroidism   2. Hypercholesteremia   3. Persistent atrial fibrillation On Xarelto.  4. Essential (primary) hypertension   5. Need for immunization against influenza  - Flu  vaccine HIGH DOSE PF (Fluzone High dose)  6. Coronary artery disease involving coronary bypass graft of native heart without angina pectoris Stable  7. 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    I have done the exam and reviewed the above chart and it is accurate to the best of my knowledge. Development worker, community has been used in this note in any air is in the dictation or transcription are unintentional.  [name], MD  [place]

## 2018-01-09 ENCOUNTER — Other Ambulatory Visit: Payer: Self-pay

## 2018-01-09 NOTE — Telephone Encounter (Signed)
Patient states that she was in to see Dr. [name] this week and was prescribed medication, she would like for CMA to give her a call backs because she had questions about prescription. KW

## 2018-01-10 MED ORDER — OXYBUTYNIN CHLORIDE 5 MG PO TABS: 5.0000 mg | ORAL_TABLET | Freq: Two times a day (BID) | ORAL | 11 refills | Status: DC

## 2018-01-10 NOTE — Telephone Encounter (Signed)
Dr. [name], patient went to fill Oxybutin at the pharmacy (we gave her a hard copy) and insurance denied it because it was the extended release tablet. I have reordered the correct medication. Please send into [facility] in [place], [place]. I have the correct pharmacy listed as well. Thanks!

## 2018-01-20 ENCOUNTER — Ambulatory Visit
Admission: RE | Admit: 2018-01-20 | Discharge: 2018-01-20 | Disposition: A | Discharge status: Home / Self Care | Payer: Medicare Other | Source: Ambulatory Visit | Attending: Internal Medicine | Admitting: Internal Medicine

## 2018-01-20 ENCOUNTER — Ambulatory Visit: Payer: Medicare Other | Admitting: Internal Medicine

## 2018-01-20 ENCOUNTER — Other Ambulatory Visit: Payer: Medicare Other

## 2018-01-20 DIAGNOSIS — C8308 Small cell B-cell lymphoma, lymph nodes of multiple sites: Secondary | ICD-10-CM | POA: Diagnosis not present

## 2018-01-20 DIAGNOSIS — J9811 Atelectasis: Secondary | ICD-10-CM | POA: Diagnosis not present

## 2018-01-20 DIAGNOSIS — E782 Mixed hyperlipidemia: Secondary | ICD-10-CM | POA: Diagnosis not present

## 2018-01-20 DIAGNOSIS — I25810 Atherosclerosis of coronary artery bypass graft(s) without angina pectoris: Secondary | ICD-10-CM | POA: Diagnosis not present

## 2018-01-20 DIAGNOSIS — I482 Chronic atrial fibrillation, unspecified: Secondary | ICD-10-CM | POA: Diagnosis not present

## 2018-01-20 DIAGNOSIS — K7689 Other specified diseases of liver: Secondary | ICD-10-CM | POA: Diagnosis not present

## 2018-01-20 DIAGNOSIS — I10 Essential (primary) hypertension: Secondary | ICD-10-CM | POA: Diagnosis not present

## 2018-01-20 DIAGNOSIS — R1909 Other intra-abdominal and pelvic swelling, mass and lump: Secondary | ICD-10-CM | POA: Diagnosis not present

## 2018-01-20 LAB — POCT I-STAT CREATININE: Creatinine, Ser: 1.2 mg/dL — ABNORMAL HIGH (ref 0.44–1.00)

## 2018-01-20 MED ORDER — IOPAMIDOL (ISOVUE-300) INJECTION 61%
100.0000 mL | Freq: Once | INTRAVENOUS | Status: AC | PRN
  Administered 2018-01-20: 80 mL via INTRAVENOUS

## 2018-01-21 ENCOUNTER — Other Ambulatory Visit: Payer: Self-pay | Admitting: Internal Medicine

## 2018-01-21 DIAGNOSIS — C8308 Small cell B-cell lymphoma, lymph nodes of multiple sites: Secondary | ICD-10-CM

## 2018-01-23 ENCOUNTER — Telehealth: Payer: Self-pay | Admitting: Pharmacist

## 2018-01-23 ENCOUNTER — Other Ambulatory Visit: Payer: Self-pay

## 2018-01-23 ENCOUNTER — Inpatient Hospital Stay (HOSPITAL_BASED_OUTPATIENT_CLINIC_OR_DEPARTMENT_OTHER): Payer: Medicare Other | Admitting: Internal Medicine

## 2018-01-23 ENCOUNTER — Encounter: Payer: Self-pay | Admitting: Internal Medicine

## 2018-01-23 ENCOUNTER — Inpatient Hospital Stay: Payer: Medicare Other | Attending: Internal Medicine

## 2018-01-23 VITALS — BP 137/80 | HR 76 | Temp 97.1°F | Resp 18

## 2018-01-23 DIAGNOSIS — Z7901 Long term (current) use of anticoagulants: Secondary | ICD-10-CM

## 2018-01-23 DIAGNOSIS — I4891 Unspecified atrial fibrillation: Secondary | ICD-10-CM | POA: Insufficient documentation

## 2018-01-23 DIAGNOSIS — M7989 Other specified soft tissue disorders: Secondary | ICD-10-CM | POA: Diagnosis not present

## 2018-01-23 DIAGNOSIS — R1900 Intra-abdominal and pelvic swelling, mass and lump, unspecified site: Secondary | ICD-10-CM

## 2018-01-23 DIAGNOSIS — C8308 Small cell B-cell lymphoma, lymph nodes of multiple sites: Secondary | ICD-10-CM | POA: Diagnosis not present

## 2018-01-23 DIAGNOSIS — D6959 Other secondary thrombocytopenia: Secondary | ICD-10-CM

## 2018-01-23 DIAGNOSIS — R599 Enlarged lymph nodes, unspecified: Secondary | ICD-10-CM

## 2018-01-23 DIAGNOSIS — R5383 Other fatigue: Secondary | ICD-10-CM | POA: Diagnosis not present

## 2018-01-23 LAB — COMPREHENSIVE METABOLIC PANEL
ALT: 19 U/L (ref 0–44)
AST: 23 U/L (ref 15–41)
Albumin: 4 g/dL (ref 3.5–5.0)
Alkaline Phosphatase: 54 U/L (ref 38–126)
Anion gap: 9 (ref 5–15)
BUN: 19 mg/dL (ref 8–23)
CO2: 26 mmol/L (ref 22–32)
Calcium: 10.1 mg/dL (ref 8.9–10.3)
Chloride: 105 mmol/L (ref 98–111)
Creatinine, Ser: 1.02 mg/dL — ABNORMAL HIGH (ref 0.44–1.00)
GFR calc Af Amer: >60 mL/min (ref >60)
GFR calc non Af Amer: 52 mL/min — ABNORMAL LOW (ref >60)
Glucose, Bld: 105 mg/dL — ABNORMAL HIGH (ref 70–99)
Potassium: 3.8 mmol/L (ref 3.5–5.1)
Sodium: 140 mmol/L (ref 135–145)
Total Bilirubin: 0.9 mg/dL (ref 0.3–1.2)
Total Protein: 6.6 g/dL (ref 6.5–8.1)

## 2018-01-23 LAB — CBC WITH DIFFERENTIAL/PLATELET
Abs Immature Granulocytes: 0 10*3/uL (ref 0.00–0.07)
Basophils Absolute: 0 10*3/uL (ref 0.0–0.1)
Basophils Relative: 1 %
Eosinophils Absolute: 0.1 10*3/uL (ref 0.0–0.5)
Eosinophils Relative: 2 %
HCT: 43.4 % (ref 36.0–46.0)
Hemoglobin: 14.2 g/dL (ref 12.0–15.0)
Immature Granulocytes: 0 %
Lymphocytes Relative: 29 %
Lymphs Abs: 1.2 10*3/uL (ref 0.7–4.0)
MCH: 27.7 pg (ref 26.0–34.0)
MCHC: 32.7 g/dL (ref 30.0–36.0)
MCV: 84.8 fL (ref 80.0–100.0)
Monocytes Absolute: 0.4 10*3/uL (ref 0.1–1.0)
Monocytes Relative: 10 %
Neutro Abs: 2.4 10*3/uL (ref 1.7–7.7)
Neutrophils Relative %: 58 %
Platelets: 100 10*3/uL — ABNORMAL LOW (ref 150–400)
RBC: 5.12 MIL/uL — ABNORMAL HIGH (ref 3.87–5.11)
RDW: 17 % — ABNORMAL HIGH (ref 11.5–15.5)
WBC: 4.2 10*3/uL (ref 4.0–10.5)
nRBC: 0 % (ref 0.0–0.2)

## 2018-01-23 MED ORDER — ACALABRUTINIB 100 MG PO CAPS: 100.0000 mg | ORAL_CAPSULE | Freq: Two times a day (BID) | ORAL | 6 refills | Status: DC

## 2018-01-23 MED ORDER — HEPARIN SOD (PORK) LOCK FLUSH 100 UNIT/ML IV SOLN
500.0000 [IU] | Freq: Once | INTRAVENOUS | Status: AC
  Filled 2018-01-23: qty 5

## 2018-01-23 MED ORDER — SODIUM CHLORIDE 0.9% FLUSH
10.0000 mL | INTRAVENOUS | Status: AC | PRN
  Filled 2018-01-23: qty 10

## 2018-01-23 NOTE — Assessment & Plan Note (Addendum)
#   Small B-cell lymphocytic lymphoma low-grade- stage IV; status post 6 cycles of Gazyva [finished April 2019]; DEC 2019- CT scan worsening lymphadenopathy-bilateral axillary mediastinal abdominal and pelvic adenopathy.  Stable left adnexal mass/lymphoma versus benign ovarian cyst.  #Discussed that given the progressive disease -discussed the treatment options include BT K inhibitor versus rituximab-venclexta.  Discussed the pros and cons of each treatment option at length.  After lengthy discussion patient wants to proceed with BT K inhibitor.  #Recommend starting acalabrutinib 100 mg twice a day.  Discussed potential side effects including but not limited to elevated blood pressure; skin rash; headaches [relieved by caffeine]; bruising/A. fib less than 5%.  Patient understands that the goal of treatment is to control the disease and not a cure.  [patient on Xarelto currently normal sinus rhythm].   # After lengthy discussion weighing risk versus benefits patient agrees to proceed with treatment as discussed above.   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  # A. Fib-stable continue Xarelto [Dr.Kowalski].  We will check regarding financial support.   #Discussed with oncology pharmacist.  Will check and drug interactions.  # DISPOSITION: # follow up in 1 months/labs- cbc/bmp-[facility]-Dr.B.   # I reviewed the blood work- with the patient in detail; also reviewed the imaging independently [as summarized above]; and with the patient in detail.   # 40 minutes face-to-face with the patient discussing the above plan of care; more than 50% of time spent on prognosis/ natural history; counseling and coordination.

## 2018-01-23 NOTE — Telephone Encounter (Signed)
Oral Oncology Pharmacist Encounter  Received new prescription for [place] (acalabrutinib) for the treatment of Small B-cell lymphocytic lymphoma, planned duration until disease progression or unacceptable drug toxicity.  CBC from 01/23/2018 assessed, no relevant lab abnormalities. Prescription dose and frequency assessed.   Current medication list in Epic reviewed, one DDIs with acalabrutinib identified: - Acalabrutinib may enhance the anticoagulant effect of Anticoagulants, such as Xarelto. Monitor patients for signs of bleeding. Interaction discussed with patient.  Prescription has been e-scribed to the [facility] for benefits analysis and approval.  Patient education Counseled patient and following her OV. Discussed administration, dosing, side effects, monitoring, drug-food interactions, safe handling, storage, and disposal. Patient will take 100 mg by mouth 2 (two) times daily.  Side effects include but not limited to: decrease wbc/plt/hgb, HA, diarrhea, atrial fibrillation.    Reviewed with patient importance of keeping a medication schedule and plan for any missed doses.  Jacqueline Webb voiced understanding and appreciation. All questions answered. Medication handout provided and consent obtained.  Oral Oncology Clinic will continue to follow for insurance authorization, copayment issues, and start date.  Provided patient with [facility] [facility] phone number. Patient knows to call the office with questions or concerns. Oral Chemotherapy Navigation Clinic will continue to follow.  Darl Pikes, PharmD, BCPS, [facility] Hematology/Oncology Clinical Pharmacist [facility] [phone]  01/23/2018 12:51 PM

## 2018-01-23 NOTE — Progress Notes (Signed)
[facility] OFFICE PROGRESS NOTE  Patient Care Team: [name], MD as PCP - General (Unknown Physician Specialty) Bary Castilla, Forest Gleason, MD (General Surgery) Forest Gleason, MD (Inactive) (Oncology) Corey Skains, MD as Consulting Physician (Cardiology) Anell Barr, OD as Consulting Physician (Optometry)  Cancer Staging No matching staging information was found for the patient.   Oncology History   1. Lymphoma low-grade, status Rituxan therapy. 2. Recurrent disease by clinical examination in December of 2012 3. Ovarian mass on PET scan in December of 2012 (normal CA 125). Ovarian mass has resolved after rituximab therapy. 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  Patient had total 5 cycles of chemotherapy with bendamustine and rituximab and 6 cycle was omitted because of significant side effects with and weakness and fatigue.  # OCT 2016- PET significant response; ON surveillance  # 18th OCT 2018- Gazyva;SEP 2018-/OCT 2018 CT scan- progression; s/p 6 cycles [#6 in April 2019]  # JAN 2020-CT scan shows progressive disease; start a acalabrutinib  # 11/06/2016- colonoscopy [Dr.Byrnett- 2 polyps]  #History of A. Fib-Xarelto; CKD stage II-III.   -------------------------------------------------------------------    # left ? Adnexal mass- ? Etiology; mild SUV uptake incidental [stable compared to previous PET in 2016] Previous workup 2015- vaginal ultrasound negative. MRI February 2018- approximately 3 cm in size; slow increase in the size of the left adnexal mass over at least 3 years- suspect involvement of lymphoma rather than a primary gynecologic malignancy. 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  Patient continues to have intermittent discomfort in the left underarm secondary lymph nodes.  No night sweats.  No abdominal pain no constipation.  Rash is currently resolved.  Complains of mild fatigue.  Leg swelling is improved.  Review of Systems  Constitutional: Positive for malaise/fatigue. Negative for chills, diaphoresis, fever and weight loss.  HENT: Negative for nosebleeds and sore throat.   Eyes: Negative for double vision.  Respiratory: Negative for cough, hemoptysis, sputum production, shortness of breath and wheezing.   Cardiovascular: Negative for chest pain, palpitations and orthopnea.  Gastrointestinal: Negative for abdominal pain, blood in stool, constipation, diarrhea, heartburn, melena, nausea and vomiting.  Genitourinary: Negative for dysuria, frequency and urgency.  Musculoskeletal: Negative for back pain and joint pain.  Neurological: Negative for dizziness, tingling, focal weakness, weakness and headaches.  Endo/Heme/Allergies: Does not bruise/bleed easily.  Psychiatric/Behavioral: Negative for depression. The patient is not nervous/anxious and does not have insomnia.       PAST MEDICAL HISTORY :  Past Medical History:  Diagnosis Date  . A-fib ([place])   . Benign neoplasm of rectum and anal canal   . 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 Hypercholesteremia   . Hypertension   . Hyperthyroidism   . Lymphoma ([place]) 2007  . Non Hodgkin's lymphoma ([place]) 01/22/2006  . Personal history of chemotherapy     PAST SURGICAL HISTORY :  Past  Surgical History:  Procedure Laterality Date  . ABDOMINAL HYSTERECTOMY  1958  . APPENDECTOMY    . BREAST BIOPSY Right 1970   right breast biopsy with clip- neg  . BREAST BIOPSY Left 2002   left breast biopsy with clip-neg  . BREAST BIOPSY Right 2014   right breast biopsy with clip-neg  . BREAST CYST ASPIRATION Bilateral 2000   bilateral fine needle aspiration  . BREAST SURGERY Left 1990   biopsy  . BREAST SURGERY Right May 08, 2012   complex fibroadenoma without malignancy.  [name] CARDIAC CATHETERIZATION  2014  . COLONOSCOPY  2009, 2012    hyperplastic rectal polyp 2012 tubular adenoma of proximal ascending colon 2000 9 repeat exam due to 2017.  [name] COLONOSCOPY WITH PROPOFOL N/A 11/06/2016   Procedure: COLONOSCOPY WITH PROPOFOL;  Surgeon: Robert Bellow, MD;  Location: [facility] ENDOSCOPY;  Service: Endoscopy;  Laterality: N/A;  . CORONARY ARTERY BYPASS GRAFT  1999  . FLEXIBLE SIGMOIDOSCOPY  1998    FAMILY HISTORY :   Family History  Problem Relation Age of Onset  . Lung cancer Father        Died age 58  . Heart disease Mother        Died age 31  . Asthma Mother   . CAD Brother        double bypass  . Heart attack Sister   . Tuberculosis Sister        43 years old  . Lung cancer Sister        Died in her 83s  . Angina Sister   . Breast cancer Sister 43  . Rheum arthritis Sister   . Aneurysm Sister        Brain, died age 80  . Angina Sister   . Heart disease Brother        Stent placement  . Heart disease Brother        stent placement  . Cancer Other        breast cancer niece, pancreatic cancer niece  . Breast cancer Other   . Ovarian cancer Neg Hx   . Diabetes Neg Hx     SOCIAL HISTORY:   Social History   Tobacco Use  . Smoking status: Never Smoker  . Smokeless tobacco: Never Used  Substance Use Topics  . Alcohol use: Yes    Alcohol/week: 2.0 - 3.0 standard drinks    Types: 1 - 2 Glasses of wine, 1 Shots of liquor per week    Comment: daily-   . Drug  use: No    ALLERGIES:  is allergic to pravastatin.  MEDICATIONS:  Current Outpatient Medications  Medication Sig Dispense Refill  . ezetimibe (ZETIA) 10 MG tablet Take 1 tablet (10 mg total) daily by mouth. 30 tablet 12  . isosorbide mononitrate (IMDUR) 30 MG 24 hr tablet Take 30 mg by mouth daily.     [name] levothyroxine (SYNTHROID, LEVOTHROID) 88 MCG tablet TAKE 1 TABLET BY MOUTH  DAILY BEFORE BREAKFAST 90 tablet 3  . lisinopril-hydrochlorothiazide (PRINZIDE,ZESTORETIC) 20-12.5 MG tablet TAKE 1 TABLET BY MOUTH  DAILY 90 tablet 3  . Melatonin 1 MG CAPS Take 1 capsule by mouth at bedtime.     . metoprolol succinate (TOPROL-XL) 25 MG 24 hr tablet Take 25 mg by mouth daily.     [name] oxybutynin (DITROPAN) 5 MG tablet Take 1 tablet (5 mg total) by mouth 2 (two) times daily. 60 tablet 11  . XARELTO 20 MG TABS  tablet Take 20 mg by mouth daily with supper.     . Acalabrutinib 100 MG CAPS Take 100 mg by mouth 2 (two) times daily. 60 capsule 6   No current facility-administered medications for this visit.    Facility-Administered Medications Ordered in Other Visits  Medication Dose Route Frequency Provider Last Rate Last Dose  . heparin lock flush 100 unit/mL  500 Units Intravenous Once Charlaine Dalton R, MD      . sodium chloride flush (NS) 0.9 % injection 10 mL  10 mL Intravenous PRN Cammie Sickle, MD        PHYSICAL EXAMINATION: ECOG PERFORMANCE STATUS: 1 - Symptomatic but completely ambulatory  BP 137/80   Pulse 76   Temp (!) 97.1 F (36.2 C) (Tympanic)   Resp 18   There were no vitals filed for this visit.  Physical Exam  Constitutional: She is oriented to person, place, and time and well-developed, well-nourished, and in no distress.  HENT:  Head: Normocephalic and atraumatic.  Mouth/Throat: Oropharynx is clear and moist. No oropharyngeal exudate.  Eyes: Pupils are equal, round, and reactive to light.  Neck: Normal range of motion. Neck supple.  Cardiovascular: Normal  rate and regular rhythm.  Pulmonary/Chest: No respiratory distress. She has no wheezes.  Abdominal: Soft. Bowel sounds are normal. She exhibits no distension and no mass. There is no abdominal tenderness. There is no rebound and no guarding.  Musculoskeletal: Normal range of motion.        General: No tenderness or edema.  Lymphadenopathy:  Approximately 2 cm lymph node felt in the left  underarm.  1 cm lymph node felt in the right underarm.;  None felt in the bilateral inguinal regions.  Neurological: She is alert and oriented to person, place, and time.  Skin: Skin is warm.  Psychiatric: Affect normal.      LABORATORY DATA:  I have reviewed the data as listed    Component Value Date/Time   NA 140 01/23/2018 0958   NA 139 07/03/2017 1425   NA 141 05/11/2014 0853   K 3.8 01/23/2018 0958   K 3.6 05/11/2014 0853   CL 105 01/23/2018 0958   CL 103 05/11/2014 0853   CO2 26 01/23/2018 0958   CO2 29 05/11/2014 0853   GLUCOSE 105 (H) 01/23/2018 0958   GLUCOSE 107 (H) 05/11/2014 0853   BUN 19 01/23/2018 0958   BUN 19 07/03/2017 1425   BUN 22 (H) 05/11/2014 0853   CREATININE 1.02 (H) 01/23/2018 0958   CREATININE 1.07 (H) 05/11/2014 0853   CALCIUM 10.1 01/23/2018 0958   CALCIUM 10.1 05/11/2014 0853   PROT 6.6 01/23/2018 0958   PROT 6.4 07/03/2017 1425   PROT 7.3 05/11/2014 0853   ALBUMIN 4.0 01/23/2018 0958   ALBUMIN 4.3 07/03/2017 1425   ALBUMIN 4.7 05/11/2014 0853   AST 23 01/23/2018 0958   AST 29 05/11/2014 0853   ALT 19 01/23/2018 0958   ALT 20 05/11/2014 0853   ALKPHOS 54 01/23/2018 0958   ALKPHOS 47 05/11/2014 0853   BILITOT 0.9 01/23/2018 0958   BILITOT 0.5 07/03/2017 1425   BILITOT 1.0 05/11/2014 0853   GFRNONAA 52 (L) 01/23/2018 0958   GFRNONAA 51 (L) 05/11/2014 0853   GFRAA >60 01/23/2018 0958   GFRAA 59 (L) 05/11/2014 0853    No results found for: SPEP, UPEP  Lab Results  Component Value Date   WBC 4.2 01/23/2018   NEUTROABS 2.4 01/23/2018   HGB 14.2  01/23/2018  HCT 43.4 01/23/2018   MCV 84.8 01/23/2018   PLT 100 (L) 01/23/2018      Chemistry      Component Value Date/Time   NA 140 01/23/2018 0958   NA 139 07/03/2017 1425   NA 141 05/11/2014 0853   K 3.8 01/23/2018 0958   K 3.6 05/11/2014 0853   CL 105 01/23/2018 0958   CL 103 05/11/2014 0853   CO2 26 01/23/2018 0958   CO2 29 05/11/2014 0853   BUN 19 01/23/2018 0958   BUN 19 07/03/2017 1425   BUN 22 (H) 05/11/2014 0853   CREATININE 1.02 (H) 01/23/2018 0958   CREATININE 1.07 (H) 05/11/2014 0853   GLU 98 05/25/2013      Component Value Date/Time   CALCIUM 10.1 01/23/2018 0958   CALCIUM 10.1 05/11/2014 0853   ALKPHOS 54 01/23/2018 0958   ALKPHOS 47 05/11/2014 0853   AST 23 01/23/2018 0958   AST 29 05/11/2014 0853   ALT 19 01/23/2018 0958   ALT 20 05/11/2014 0853   BILITOT 0.9 01/23/2018 0958   BILITOT 0.5 07/03/2017 1425   BILITOT 1.0 05/11/2014 0853       RADIOGRAPHIC STUDIES: I have personally reviewed the radiological images as listed and agreed with the findings in the report. No results found.   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  Stable left adnexal mass/lymphoma versus benign ovarian cyst.  #Discussed that given the progressive disease -discussed the treatment options include BT K inhibitor versus rituximab-venclexta.  Discussed the pros and cons of each treatment option at length.  After lengthy discussion patient wants to proceed with BT K inhibitor.  #Recommend starting acalabrutinib 100 mg twice a day.  Discussed potential side effects including but not limited to elevated blood pressure; skin rash; headaches [relieved by caffeine]; bruising/A. fib less than 5%.  Patient understands that  the goal of treatment is to control the disease and not a cure.  [patient on Xarelto currently normal sinus rhythm].   # After lengthy discussion weighing risk versus benefits patient agrees to proceed with treatment as discussed above.   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  # A. Fib-stable continue Xarelto [Dr.Kowalski].  We will check regarding financial support.   #Discussed with oncology pharmacist.  Will check and drug interactions.  # DISPOSITION: # follow up in 1 months/labs- cbc/bmp-LD[facility]-Dr.B.   # I reviewed the blood work- with the patient in detail; also reviewed the imaging independently [as summarized above]; and with the patient in detail.   # 40 minutes face-to-face with the patient discussing the above plan of care; more than 50% of time spent on prognosis/ natural history; counseling and coordination.    Orders Placed This Encounter  Procedures  . CBC with Differential/Platelet    Standing Status:   Future    Standing Expiration Date:   01/24/2019  . Comprehensive metabolic panel    Standing Status:   Future    Standing Expiration Date:   01/24/2019   All questions were answered. The patient knows to call the clinic with any problems, questions or concerns.      Cammie Sickle, MD 01/23/2018 6:57 PM

## 2018-01-24 ENCOUNTER — Telehealth: Payer: Self-pay | Admitting: Pharmacy Technician

## 2018-01-24 NOTE — Telephone Encounter (Signed)
Oral Oncology Patient Advocate Encounter  Received notification from [employer] that prior authorization for Calquence is required.  PA submitted on CoverMyMeds Key [id number] Status is pending  Oral Oncology Clinic will continue to follow.  [place] Patient [place] Phone [phone] Fax [phone] 01/24/2018 9:01 AM

## 2018-01-27 ENCOUNTER — Telehealth: Payer: Self-pay | Admitting: Pharmacy Technician

## 2018-01-27 MED FILL — CALQUENCE 100 MG CAPSULE: 100 | 30 days supply | Qty: 60 | Fill #0

## 2018-01-27 NOTE — Telephone Encounter (Signed)
Oral Oncology Patient Advocate Encounter  Submitted an application to [facility] to provide copayment assistance for Calquence. Patient has been conditionally approved pending income verification.  After documents have been submitted to [facility] they will review and determine if patient qualifies for grant funding.  Once approved patient will be able to use the grant.  The grant amount is $8000.  [employer] ID: [id number]  I have spoken with the patient..     The billing information is below and will be shared with [place] when approved.   RxBin: [id number] PCN: [id number] Member ID: [id number] Group ID: [id number] Dates of Eligibility: 12/28/17 through 12/28/18  I will continue to update this encounter.  [place] Patient [place] Phone [phone] Fax [phone] 01/27/2018 10:17 AM

## 2018-01-27 NOTE — Telephone Encounter (Signed)
Oral Oncology Patient Advocate Encounter   Was successful in securing patient an $36 grant from [facility]) to provide copayment coverage for Calquence.  This will keep the out of pocket expense at $0.     I have spoken with the patient.    The billing information is as follows and has been shared with [place].   Member ID: [id number] Group ID: [id number] RxBin: [id number] Dates of Eligibility: 10/26/17 through 01/24/19  [place] Patient [place] Phone [phone] Fax [phone] 01/27/2018 10:12 AM

## 2018-01-27 NOTE — Telephone Encounter (Signed)
Oral Oncology Patient Advocate Encounter  Prior Authorization for Calquence has been approved.    PA# [id number] Effective dates: 01/24/18 through 01/22/19  Patients co-pay is $2925.39  Oral Oncology Clinic will continue to follow.   [place] Patient [place] Phone [phone] Fax [phone] 01/27/2018 8:18 AM

## 2018-02-10 ENCOUNTER — Other Ambulatory Visit: Payer: Self-pay | Admitting: Family Medicine

## 2018-02-19 MED FILL — CALQUENCE 100 MG CAPSULE: 100 | 30 days supply | Qty: 60 | Fill #1

## 2018-02-27 ENCOUNTER — Encounter: Payer: Self-pay | Admitting: Internal Medicine

## 2018-02-27 ENCOUNTER — Other Ambulatory Visit: Payer: Self-pay

## 2018-02-27 ENCOUNTER — Inpatient Hospital Stay: Payer: Medicare Other | Attending: Internal Medicine

## 2018-02-27 ENCOUNTER — Inpatient Hospital Stay (HOSPITAL_BASED_OUTPATIENT_CLINIC_OR_DEPARTMENT_OTHER): Payer: Medicare Other | Admitting: Internal Medicine

## 2018-02-27 VITALS — BP 116/75 | HR 73 | Temp 97.8°F | Resp 18 | Ht 65.0 in | Wt 150.0 lb

## 2018-02-27 DIAGNOSIS — R233 Spontaneous ecchymoses: Secondary | ICD-10-CM | POA: Insufficient documentation

## 2018-02-27 DIAGNOSIS — Z7901 Long term (current) use of anticoagulants: Secondary | ICD-10-CM | POA: Insufficient documentation

## 2018-02-27 DIAGNOSIS — R599 Enlarged lymph nodes, unspecified: Secondary | ICD-10-CM | POA: Diagnosis not present

## 2018-02-27 DIAGNOSIS — R110 Nausea: Secondary | ICD-10-CM

## 2018-02-27 DIAGNOSIS — I4891 Unspecified atrial fibrillation: Secondary | ICD-10-CM | POA: Insufficient documentation

## 2018-02-27 DIAGNOSIS — C8308 Small cell B-cell lymphoma, lymph nodes of multiple sites: Secondary | ICD-10-CM | POA: Diagnosis not present

## 2018-02-27 DIAGNOSIS — R1900 Intra-abdominal and pelvic swelling, mass and lump, unspecified site: Secondary | ICD-10-CM | POA: Diagnosis not present

## 2018-02-27 DIAGNOSIS — D6959 Other secondary thrombocytopenia: Secondary | ICD-10-CM

## 2018-02-27 LAB — CBC WITH DIFFERENTIAL/PLATELET
Abs Immature Granulocytes: 0.01 10*3/uL (ref 0.00–0.07)
Basophils Absolute: 0 10*3/uL (ref 0.0–0.1)
Basophils Relative: 1 %
Eosinophils Absolute: 0.1 10*3/uL (ref 0.0–0.5)
Eosinophils Relative: 2 %
HCT: 37.4 % (ref 36.0–46.0)
Hemoglobin: 12.2 g/dL (ref 12.0–15.0)
Immature Granulocytes: 0 %
Lymphocytes Relative: 28 %
Lymphs Abs: 1 10*3/uL (ref 0.7–4.0)
MCH: 28.6 pg (ref 26.0–34.0)
MCHC: 32.6 g/dL (ref 30.0–36.0)
MCV: 87.8 fL (ref 80.0–100.0)
MONO ABS: 0.3 10*3/uL (ref 0.1–1.0)
MONOS PCT: 9 %
Neutro Abs: 2.1 10*3/uL (ref 1.7–7.7)
Neutrophils Relative %: 60 %
Platelets: 88 10*3/uL — ABNORMAL LOW (ref 150–400)
RBC: 4.26 MIL/uL (ref 3.87–5.11)
RDW: 15.6 % — ABNORMAL HIGH (ref 11.5–15.5)
WBC: 3.5 10*3/uL — ABNORMAL LOW (ref 4.0–10.5)
nRBC: 0 % (ref 0.0–0.2)

## 2018-02-27 LAB — COMPREHENSIVE METABOLIC PANEL
ALT: 25 U/L (ref 0–44)
AST: 24 U/L (ref 15–41)
Albumin: 3.9 g/dL (ref 3.5–5.0)
Alkaline Phosphatase: 56 U/L (ref 38–126)
Anion gap: 5 (ref 5–15)
BUN: 30 mg/dL — ABNORMAL HIGH (ref 8–23)
CO2: 25 mmol/L (ref 22–32)
Calcium: 9.3 mg/dL (ref 8.9–10.3)
Chloride: 111 mmol/L (ref 98–111)
Creatinine, Ser: 1.34 mg/dL — ABNORMAL HIGH (ref 0.44–1.00)
GFR calc Af Amer: 44 mL/min — ABNORMAL LOW (ref >60)
GFR calc non Af Amer: 38 mL/min — ABNORMAL LOW (ref >60)
GLUCOSE: 104 mg/dL — AB (ref 70–99)
Potassium: 3.6 mmol/L (ref 3.5–5.1)
SODIUM: 141 mmol/L (ref 135–145)
Total Bilirubin: 0.7 mg/dL (ref 0.3–1.2)
Total Protein: 6.4 g/dL — ABNORMAL LOW (ref 6.5–8.1)

## 2018-02-27 MED ORDER — ONDANSETRON HCL 4 MG PO TABS: 4.0000 mg | ORAL_TABLET | Freq: Three times a day (TID) | ORAL | 3 refills | Status: DC | PRN

## 2018-02-27 NOTE — Assessment & Plan Note (Addendum)
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  Stable left adnexal mass/lymphoma versus benign ovarian cyst.  Currently on acalbrutinib 100 mg BID.  #Clinical improvement noted-improvement of the bilateral adenopathy.;  However spontaneous bruising noted [see below]; recommend taking acalabrutinib 100 mg once a day.  #Continues bruising easy bruising-multifactorial thrombocytopenia 88/Xarelto/acalabrutinib-monitor closely.  No concern for internal bleeding.  # Thrombocytopenia-88/ worse; sec to Acalbrutinib.   # A. Fib-stable continue Xarelto [Dr.Kowalski]. stable; see above.   # DISPOSITION: # follow up in 1 months/labs- cbc/cmp-[facility]-Dr.B.

## 2018-02-27 NOTE — Progress Notes (Signed)
[place] OFFICE PROGRESS NOTE  Patient Care Team: [name], MD as PCP - General (Unknown Physician Specialty) Bary Castilla, Forest Gleason, MD (General Surgery) Forest Gleason, MD (Inactive) (Oncology) Corey Skains, MD as Consulting Physician (Cardiology) Anell Barr, OD as Consulting Physician (Optometry)  Cancer Staging No matching staging information was found for the patient.   Oncology History   1. Lymphoma low-grade, status Rituxan therapy. 2. Recurrent disease by clinical examination in December of 2012 3. Ovarian mass on PET scan in December of 2012 (normal CA 125). Ovarian mass has resolved after rituximab therapy. 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  Patient had total 5 cycles of chemotherapy with bendamustine and rituximab and 6 cycle was omitted because of significant side effects with and weakness and fatigue.  # OCT 2016- PET significant response; ON surveillance  # 18th OCT 2018- [place];SEP 2018-/OCT 2018 CT scan- progression; s/p 6 cycles [#6 in April 2019]  # JAN 2020-CT scan shows progressive disease; start a acalabrutinib 100mg  BID  # feb 6th2020- decrease dose of acalbrutinib to 100 mg/day [sec to spon bruising]  # 11/06/2016- colonoscopy [Dr.Byrnett- 2 polyps]  #History of A. Fib-Xarelto; CKD stage II-III.   -------------------------------------------------------------------    # left ? Adnexal mass- ? Etiology; mild SUV uptake incidental [stable compared to previous PET in 2016] Previous workup 2015- vaginal ultrasound negative. MRI February 2018- approximately 3 cm in size; slow increase in the size of the left adnexal mass over at least 3 years- suspect involvement of lymphoma rather than a primary gynecologic  malignancy. 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  female pleasant patient above history of SLL/CLL currently on alcalbrutinib 100 mg BID is here for follow-up.  Patient denies any headaches.  Denies any skin rash denies any nausea vomiting.  Patient admits to easy bruising-spontaneous large bilateral shoulders.  No gum bleeding.  Or any other mucosal bleeding.    Review of Systems  Constitutional: Positive for malaise/fatigue. Negative for chills, diaphoresis, fever and weight loss.  HENT: Negative for nosebleeds and sore throat.   Eyes: Negative for double vision.  Respiratory: Negative for cough, hemoptysis, sputum production, shortness of breath and wheezing.   Cardiovascular: Negative for chest pain, palpitations and orthopnea.  Gastrointestinal: Positive for nausea. Negative for abdominal pain, blood in stool, constipation, diarrhea, heartburn, melena and vomiting.  Genitourinary: Negative for dysuria, frequency and urgency.  Musculoskeletal: Negative for back pain and joint pain.  Neurological: Negative for dizziness, tingling, focal weakness, weakness and headaches.  Endo/Heme/Allergies: Bruises/bleeds easily.  Psychiatric/Behavioral: Negative for depression. The patient is not nervous/anxious and does not have insomnia.       PAST MEDICAL HISTORY :  Past Medical History:  Diagnosis Date  . A-fib ([place])   . Benign neoplasm of rectum and anal canal   . 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 Hypercholesteremia   . Hypertension   . Hyperthyroidism   . Lymphoma ([place]) 2007  . Non Hodgkin's lymphoma ([place]) 01/22/2006  . Personal  history  of chemotherapy     PAST SURGICAL HISTORY :   Past Surgical History:  Procedure Laterality Date  . ABDOMINAL HYSTERECTOMY  1958  . APPENDECTOMY    . BREAST BIOPSY Right 1970   right breast biopsy with clip- neg  . BREAST BIOPSY Left 2002   left breast biopsy with clip-neg  . BREAST BIOPSY Right 2014   right breast biopsy with clip-neg  . BREAST CYST ASPIRATION Bilateral 2000   bilateral fine needle aspiration  . BREAST SURGERY Left 1990   biopsy  . BREAST SURGERY Right May 08, 2012   complex fibroadenoma without malignancy.  [name] CARDIAC CATHETERIZATION  2014  . COLONOSCOPY  2009, 2012    hyperplastic rectal polyp 2012 tubular adenoma of proximal ascending colon 2000 9 repeat exam due to 2017.  [name] COLONOSCOPY WITH PROPOFOL N/A 11/06/2016   Procedure: COLONOSCOPY WITH PROPOFOL;  Surgeon: Robert Bellow, MD;  Location: [facility] ENDOSCOPY;  Service: Endoscopy;  Laterality: N/A;  . CORONARY ARTERY BYPASS GRAFT  1999  . FLEXIBLE SIGMOIDOSCOPY  1998    FAMILY HISTORY :   Family History  Problem Relation Age of Onset  . Lung cancer Father        Died age 79  . Heart disease Mother        Died age 15  . Asthma Mother   . CAD Brother        double bypass  . Heart attack Sister   . Tuberculosis Sister        17 years old  . Lung cancer Sister        Died in her 92s  . Angina Sister   . Breast cancer Sister 84  . Rheum arthritis Sister   . Aneurysm Sister        Brain, died age 21  . Angina Sister   . Heart disease Brother        Stent placement  . Heart disease Brother        stent placement  . Cancer Other        breast cancer niece, pancreatic cancer niece  . Breast cancer Other   . Ovarian cancer Neg Hx   . Diabetes Neg Hx     SOCIAL HISTORY:   Social History   Tobacco Use  . Smoking status: Never Smoker  . Smokeless tobacco: Never Used  Substance Use Topics  . Alcohol use: Yes    Alcohol/week: 2.0 - 3.0 standard drinks    Types: 1 - 2 Glasses of wine, 1  Shots of liquor per week    Comment: daily-   . Drug use: No    ALLERGIES:  is allergic to pravastatin.  MEDICATIONS:  Current Outpatient Medications  Medication Sig Dispense Refill  . Acalabrutinib 100 MG CAPS Take 100 mg by mouth 2 (two) times daily. 60 capsule 6  . ezetimibe (ZETIA) 10 MG tablet Take 1 tablet (10 mg total) daily by mouth. 30 tablet 12  . levothyroxine (SYNTHROID, LEVOTHROID) 88 MCG tablet TAKE 1 TABLET BY MOUTH  DAILY BEFORE BREAKFAST 90 tablet 3  . lisinopril-hydrochlorothiazide (PRINZIDE,ZESTORETIC) 20-12.5 MG tablet TAKE 1 TABLET BY MOUTH  DAILY 90 tablet 3  . Melatonin 1 MG CAPS Take 1 capsule by mouth at bedtime.     . metoprolol succinate (TOPROL-XL) 25 MG 24 hr tablet Take 25 mg by mouth daily.     [name] oxybutynin (DITROPAN) 5 MG tablet Take 1 tablet (5 mg total) by mouth 2 (  two) times daily. 60 tablet 11  . XARELTO 20 MG TABS tablet Take 20 mg by mouth daily with supper.     . ondansetron (ZOFRAN) 4 MG tablet Take 1 tablet (4 mg total) by mouth every 8 (eight) hours as needed for nausea or vomiting. 40 tablet 3   No current facility-administered medications for this visit.    Facility-Administered Medications Ordered in Other Visits  Medication Dose Route Frequency Provider Last Rate Last Dose  . heparin lock flush 100 unit/mL  500 Units Intravenous Once Charlaine Dalton R, MD      . sodium chloride flush (NS) 0.9 % injection 10 mL  10 mL Intravenous PRN Cammie Sickle, MD        PHYSICAL EXAMINATION: ECOG PERFORMANCE STATUS: 1 - Symptomatic but completely ambulatory  BP 116/75 (Patient Position: Sitting)   Pulse 73   Temp 97.8 F (36.6 C) (Tympanic)   Resp 18   Ht 5\' 5"  (1.651 m)   Wt 150 lb (68 kg)   BMI 24.96 kg/m    Filed Weights   02/27/18 1037  Weight: 150 lb (68 kg)    Physical Exam  Constitutional: She is oriented to person, place, and time and well-developed, well-nourished, and in no distress.  HENT:  Head: Normocephalic  and atraumatic.  Mouth/Throat: Oropharynx is clear and moist. No oropharyngeal exudate.  Eyes: Pupils are equal, round, and reactive to light.  Neck: Normal range of motion. Neck supple.  Cardiovascular: Normal rate and regular rhythm.  Pulmonary/Chest: No respiratory distress. She has no wheezes.  Abdominal: Soft. Bowel sounds are normal. She exhibits no distension and no mass. There is no abdominal tenderness. There is no rebound and no guarding.  Musculoskeletal: Normal range of motion.        General: No tenderness or edema.  Lymphadenopathy:  Bilateral up to 1 cm lymph node noted in the axilla bil- improved.  None felt in the bilateral inguinal regions.  Neurological: She is alert and oriented to person, place, and time.  Skin: Skin is warm.  Bilateral 4 to 5 cm shoulder bruises noted.  Psychiatric: Affect normal.      LABORATORY DATA:  I have reviewed the data as listed    Component Value Date/Time   NA 141 02/27/2018 0951   NA 139 07/03/2017 1425   NA 141 05/11/2014 0853   K 3.6 02/27/2018 0951   K 3.6 05/11/2014 0853   CL 111 02/27/2018 0951   CL 103 05/11/2014 0853   CO2 25 02/27/2018 0951   CO2 29 05/11/2014 0853   GLUCOSE 104 (H) 02/27/2018 0951   GLUCOSE 107 (H) 05/11/2014 0853   BUN 30 (H) 02/27/2018 0951   BUN 19 07/03/2017 1425   BUN 22 (H) 05/11/2014 0853   CREATININE 1.34 (H) 02/27/2018 0951   CREATININE 1.07 (H) 05/11/2014 0853   CALCIUM 9.3 02/27/2018 0951   CALCIUM 10.1 05/11/2014 0853   PROT 6.4 (L) 02/27/2018 0951   PROT 6.4 07/03/2017 1425   PROT 7.3 05/11/2014 0853   ALBUMIN 3.9 02/27/2018 0951   ALBUMIN 4.3 07/03/2017 1425   ALBUMIN 4.7 05/11/2014 0853   AST 24 02/27/2018 0951   AST 29 05/11/2014 0853   ALT 25 02/27/2018 0951   ALT 20 05/11/2014 0853   ALKPHOS 56 02/27/2018 0951   ALKPHOS 47 05/11/2014 0853   BILITOT 0.7 02/27/2018 0951   BILITOT 0.5 07/03/2017 1425   BILITOT 1.0 05/11/2014 0853   GFRNONAA 38 (L) 02/27/2018 0177  GFRNONAA 51 (L) 05/11/2014 0853   GFRAA 44 (L) 02/27/2018 0951   GFRAA 59 (L) 05/11/2014 0853    No results found for: SPEP, UPEP  Lab Results  Component Value Date   WBC 3.5 (L) 02/27/2018   NEUTROABS 2.1 02/27/2018   HGB 12.2 02/27/2018   HCT 37.4 02/27/2018   MCV 87.8 02/27/2018   PLT 88 (L) 02/27/2018      Chemistry      Component Value Date/Time   NA 141 02/27/2018 0951   NA 139 07/03/2017 1425   NA 141 05/11/2014 0853   K 3.6 02/27/2018 0951   K 3.6 05/11/2014 0853   CL 111 02/27/2018 0951   CL 103 05/11/2014 0853   CO2 25 02/27/2018 0951   CO2 29 05/11/2014 0853   BUN 30 (H) 02/27/2018 0951   BUN 19 07/03/2017 1425   BUN 22 (H) 05/11/2014 0853   CREATININE 1.34 (H) 02/27/2018 0951   CREATININE 1.07 (H) 05/11/2014 0853   GLU 98 05/25/2013      Component Value Date/Time   CALCIUM 9.3 02/27/2018 0951   CALCIUM 10.1 05/11/2014 0853   ALKPHOS 56 02/27/2018 0951   ALKPHOS 47 05/11/2014 0853   AST 24 02/27/2018 0951   AST 29 05/11/2014 0853   ALT 25 02/27/2018 0951   ALT 20 05/11/2014 0853   BILITOT 0.7 02/27/2018 0951   BILITOT 0.5 07/03/2017 1425   BILITOT 1.0 05/11/2014 0853       RADIOGRAPHIC STUDIES: I have personally reviewed the radiological images as listed and agreed with the findings in the report. No results found.   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  Stable left adnexal mass/lymphoma versus benign ovarian cyst.  Currently on acalbrutinib 100 mg BID.  #Clinical improvement noted-improvement of the bilateral adenopathy.;  However spontaneous bruising noted [see below]; recommend taking acalabrutinib 100 mg once a day.  #Continues bruising easy bruising-multifactorial thrombocytopenia 88/Xarelto/acalabrutinib-monitor closely.  No  concern for internal bleeding.  # Thrombocytopenia-88/ worse; sec to Acalbrutinib.   # A. Fib-stable continue Xarelto [Dr.Kowalski]. stable; see above.   # DISPOSITION: # follow up in 1 months/labs- cbc/cmp-LDH-Dr.B.   Orders Placed This Encounter  Procedures  . CBC with Differential    Standing Status:   Future    Standing Expiration Date:   02/28/2019  . Comprehensive metabolic panel    Standing Status:   Future    Standing Expiration Date:   02/28/2019  . Lactate dehydrogenase    Standing Status:   Future    Standing Expiration Date:   02/28/2019   All questions were answered. The patient knows to call the clinic with any problems, questions or concerns.      Cammie Sickle, MD 02/27/2018 8:16 PM

## 2018-02-27 NOTE — Patient Instructions (Signed)
#   Take alcabrutinib 1pill a day.

## 2018-02-28 NOTE — Telephone Encounter (Signed)
Oral Oncology Patient Advocate Encounter   Called and spoke with patient on 02/26/2018 to remind her to bring income documents to appointment.  She informed me that she had already sent in the income documents with the signed verification form.  I checked the [employer] portal and Jacqueline Webb's application has been fully approved.

## 2018-03-04 DIAGNOSIS — D485 Neoplasm of uncertain behavior of skin: Secondary | ICD-10-CM | POA: Diagnosis not present

## 2018-03-04 DIAGNOSIS — X32XXXS Exposure to sunlight, sequela: Secondary | ICD-10-CM | POA: Diagnosis not present

## 2018-03-04 DIAGNOSIS — D1801 Hemangioma of skin and subcutaneous tissue: Secondary | ICD-10-CM | POA: Diagnosis not present

## 2018-03-04 DIAGNOSIS — L570 Actinic keratosis: Secondary | ICD-10-CM | POA: Diagnosis not present

## 2018-03-04 DIAGNOSIS — L821 Other seborrheic keratosis: Secondary | ICD-10-CM | POA: Diagnosis not present

## 2018-03-04 DIAGNOSIS — C4361 Malignant melanoma of right upper limb, including shoulder: Secondary | ICD-10-CM | POA: Diagnosis not present

## 2018-03-04 DIAGNOSIS — L719 Rosacea, unspecified: Secondary | ICD-10-CM | POA: Diagnosis not present

## 2018-03-04 DIAGNOSIS — Z1283 Encounter for screening for malignant neoplasm of skin: Secondary | ICD-10-CM | POA: Diagnosis not present

## 2018-03-04 DIAGNOSIS — D0361 Melanoma in situ of right upper limb, including shoulder: Secondary | ICD-10-CM | POA: Diagnosis not present

## 2018-03-11 DIAGNOSIS — D0361 Melanoma in situ of right upper limb, including shoulder: Secondary | ICD-10-CM | POA: Diagnosis not present

## 2018-03-11 DIAGNOSIS — C439 Malignant melanoma of skin, unspecified: Secondary | ICD-10-CM | POA: Diagnosis not present

## 2018-03-11 DIAGNOSIS — C4361 Malignant melanoma of right upper limb, including shoulder: Secondary | ICD-10-CM | POA: Diagnosis not present

## 2018-03-26 ENCOUNTER — Other Ambulatory Visit: Payer: Self-pay | Admitting: Internal Medicine

## 2018-03-26 ENCOUNTER — Inpatient Hospital Stay: Payer: Medicare Other | Attending: Internal Medicine

## 2018-03-26 ENCOUNTER — Telehealth: Payer: Self-pay | Admitting: Pharmacy Technician

## 2018-03-26 ENCOUNTER — Encounter: Payer: Self-pay | Admitting: Internal Medicine

## 2018-03-26 ENCOUNTER — Telehealth: Payer: Self-pay | Admitting: Pharmacist

## 2018-03-26 ENCOUNTER — Inpatient Hospital Stay (HOSPITAL_BASED_OUTPATIENT_CLINIC_OR_DEPARTMENT_OTHER): Payer: Medicare Other | Admitting: Internal Medicine

## 2018-03-26 VITALS — BP 122/73 | HR 76 | Temp 97.3°F | Resp 16 | Wt 153.6 lb

## 2018-03-26 DIAGNOSIS — R233 Spontaneous ecchymoses: Secondary | ICD-10-CM | POA: Insufficient documentation

## 2018-03-26 DIAGNOSIS — Z7901 Long term (current) use of anticoagulants: Secondary | ICD-10-CM | POA: Diagnosis not present

## 2018-03-26 DIAGNOSIS — R1900 Intra-abdominal and pelvic swelling, mass and lump, unspecified site: Secondary | ICD-10-CM | POA: Diagnosis not present

## 2018-03-26 DIAGNOSIS — C8308 Small cell B-cell lymphoma, lymph nodes of multiple sites: Secondary | ICD-10-CM

## 2018-03-26 DIAGNOSIS — I4891 Unspecified atrial fibrillation: Secondary | ICD-10-CM | POA: Diagnosis not present

## 2018-03-26 DIAGNOSIS — D6959 Other secondary thrombocytopenia: Secondary | ICD-10-CM | POA: Insufficient documentation

## 2018-03-26 DIAGNOSIS — R599 Enlarged lymph nodes, unspecified: Secondary | ICD-10-CM | POA: Insufficient documentation

## 2018-03-26 LAB — COMPREHENSIVE METABOLIC PANEL
ALBUMIN: 3.9 g/dL (ref 3.5–5.0)
ALT: 24 U/L (ref 0–44)
AST: 26 U/L (ref 15–41)
Alkaline Phosphatase: 59 U/L (ref 38–126)
Anion gap: 6 (ref 5–15)
BUN: 22 mg/dL (ref 8–23)
CO2: 24 mmol/L (ref 22–32)
Calcium: 9.3 mg/dL (ref 8.9–10.3)
Chloride: 107 mmol/L (ref 98–111)
Creatinine, Ser: 0.97 mg/dL (ref 0.44–1.00)
GFR calc Af Amer: >60 mL/min (ref >60)
GFR calc non Af Amer: 56 mL/min — ABNORMAL LOW (ref >60)
GLUCOSE: 109 mg/dL — AB (ref 70–99)
Potassium: 3.7 mmol/L (ref 3.5–5.1)
Sodium: 137 mmol/L (ref 135–145)
TOTAL PROTEIN: 6.2 g/dL — AB (ref 6.5–8.1)
Total Bilirubin: 1.2 mg/dL (ref 0.3–1.2)

## 2018-03-26 LAB — LACTATE DEHYDROGENASE: LDH: 128 U/L (ref 98–192)

## 2018-03-26 LAB — CBC WITH DIFFERENTIAL/PLATELET
Abs Immature Granulocytes: 0.01 10*3/uL (ref 0.00–0.07)
BASOS PCT: 1 %
Basophils Absolute: 0.1 10*3/uL (ref 0.0–0.1)
Eosinophils Absolute: 0.1 10*3/uL (ref 0.0–0.5)
Eosinophils Relative: 2 %
HCT: 36.5 % (ref 36.0–46.0)
Hemoglobin: 12.1 g/dL (ref 12.0–15.0)
Immature Granulocytes: 0 %
Lymphocytes Relative: 21 %
Lymphs Abs: 0.9 10*3/uL (ref 0.7–4.0)
MCH: 29.8 pg (ref 26.0–34.0)
MCHC: 33.2 g/dL (ref 30.0–36.0)
MCV: 89.9 fL (ref 80.0–100.0)
Monocytes Absolute: 0.4 10*3/uL (ref 0.1–1.0)
Monocytes Relative: 9 %
Neutro Abs: 3 10*3/uL (ref 1.7–7.7)
Neutrophils Relative %: 67 %
Platelets: 104 10*3/uL — ABNORMAL LOW (ref 150–400)
RBC: 4.06 MIL/uL (ref 3.87–5.11)
RDW: 15.3 % (ref 11.5–15.5)
WBC: 4.5 10*3/uL (ref 4.0–10.5)
nRBC: 0 % (ref 0.0–0.2)

## 2018-03-26 MED ORDER — ALLOPURINOL 300 MG PO TABS: 300.0000 mg | ORAL_TABLET | Freq: Two times a day (BID) | ORAL | 0 refills | Status: DC

## 2018-03-26 MED ORDER — VENETOCLAX 10 & 50 & 100 MG PO TBPK: ORAL_TABLET | ORAL | 0 refills | Status: DC

## 2018-03-26 NOTE — Telephone Encounter (Signed)
Oral Oncology Patient Advocate Encounter  Received notification from [employer] that prior authorization for Venclexta is required.  PA submitted on CoverMyMeds Key [id number] Status is pending  Oral Oncology Clinic will continue to follow.  [place] Patient [place] Phone [phone] Fax [phone] 03/26/2018 4:26 PM

## 2018-03-26 NOTE — Telephone Encounter (Signed)
Oral Oncology Patient Advocate Encounter  Prior Authorization for Jacqueline Webb has been approved.    PA# [id number] Effective dates: 03/26/2018 through 01/22/2019  Patients co-pay is $140.09.  Patient has grant funding that will reduce this copay to $0.00  Oral Oncology Clinic will continue to follow.   [place] Patient [facility] Phone [phone] Fax [phone] 03/26/2018 4:27 PM

## 2018-03-26 NOTE — Telephone Encounter (Signed)
Oral Oncology Pharmacist Encounter  Received new prescription for Venclexta (venetoclax) for the treatment of CLL, planned duration until disease progression or unacceptable drug toxicity. Planned start date for venetoclax ramp up 04/08/2018. Patient appropriate for outpatient dose escalation.   CBC/CMP/LDH from 03/26/2018 assessed, no relevant lab abnormalities. Prescription dose and frequency assessed. Patient will use CLL venetoclax starter kit and follow the weekly ramp up. Prescription for allopurinol sent to patient's local pharmacy.  Current medication list in Epic reviewed, no DDIs with venetoclax identified.  Prescription has been e-scribed to the [facility] for benefits analysis and approval.  Patient education Counseled patient on administration, dosing, side effects, monitoring, drug-food interactions, safe handling, storage, and disposal. Patient will take 20 mg orally once daily during week 1; 50 mg once daily during week 2; 100 mg once daily during week 3; 200 mg once daily during week 4, then 400 mg thereafter.  Side effects include but not limited to: TLS, decreased wbc, fatigue, N/V/D.    Reviewed with patient importance of keeping a medication schedule and plan for any missed doses.  Jacqueline Webb voiced understanding and appreciation. All questions answered. Medication handout provided and consent obtained.  Oral Oncology Clinic will continue to follow for insurance authorization, copayment issues, and start date.  Provided patient with [facility] [place] [facility] phone number. Patient knows to call the office with questions or concerns. Oral Chemotherapy Navigation Clinic will continue to follow.  Darl Pikes, PharmD, BCPS, [facility] Hematology/Oncology Clinical Pharmacist [facility]/HP/AP [facility] [place] [facility] [phone]  03/26/2018 1:22 PM

## 2018-03-26 NOTE — Assessment & Plan Note (Addendum)
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  Stable left adnexal mass/lymphoma versus benign ovarian cyst.  Currently on acalbrutinib 100 mg once day.  # DISCONTINUE acalburtinib; poor tolerance secondary to ongoing bruising/on Xarelto.   #Had a long discussion regarding starting the patient on venetoclax.  Given the low white count/lymph node mass less than 5 cm patient is candidate for outpatient treatment.  Discussed the risk of tumor lysis syndrome; and strict need to follow guidelines regarding testing for tumor lysis.  Discussed that duration of treatment is 2 years.  And is a good chance that she would likely go into remission; not needing ongoing therapy.  #Patient understands rituximab would be added after 5-week ramp-up of venetoclax.  Discussed the potential effect of neutropenia/possible risk of infection.   #Tumor lysis prophylaxis -start patient on allopurinol discussed the potential rash.  Discussed regarding IV fluids/the day of venetoclax administration.  #Easy bruising improved not resolved-multifactorial Xarelto thrombocytopenia acalabrutinib..  # Thrombocytopenia-100 secondary to acalabrutinib improved.   #Right posterior neck melanoma stage I [ Dr.Lee/Elkins]..   # A. Fib-stable continue Xarelto [Dr.Kowalski]. stable.   # DISPOSITION: # follow up with MD March 17th-at 8:30 am cbc/cmp/ldh/uric acid/mag/phos; IVFs  over 3 hours;sam day- 3 pm-labs- /cmp/ldh/uric acid/mag/phos;  # follow up with MD on March 24th- 8:30 am cbc/cmp/ldh/uric acid/mag/phos/  IVFs  over 3 hours;sam day- 3 pm-labs- /cmp/ldh/uric acid/mag/phos;- Dr.B  # 40 minutes face-to-face with the patient discussing the above plan of care; more than 50% of time spent on prognosis/ natural history; counseling and coordination.

## 2018-03-26 NOTE — Progress Notes (Signed)
[facility] OFFICE PROGRESS NOTE  Patient Care Team: [name], MD as PCP - General (Unknown Physician Specialty) Bary Castilla, Forest Gleason, MD (General Surgery) Forest Gleason, MD (Inactive) (Oncology) Corey Skains, MD as Consulting Physician (Cardiology) Anell Barr, OD as Consulting Physician (Optometry)  Cancer Staging No matching staging information was found for the patient.   Oncology History   1. Lymphoma low-grade, status Rituxan therapy. 2. Recurrent disease by clinical examination in December of 2012 3. Ovarian mass on PET scan in December of 2012 (normal CA 125). Ovarian mass has resolved after rituximab therapy. 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  Patient had total 5 cycles of chemotherapy with bendamustine and rituximab and 6 cycle was omitted because of significant side effects with and weakness and fatigue.  # OCT 2016- PET significant response; ON surveillance  # 18th OCT 2018- [place];SEP 2018-/OCT 2018 CT scan- progression; s/p 6 cycles [#6 in April 2019]  # JAN 2020-CT scan shows progressive disease; start a acalabrutinib 100mg  BID  # feb 6th2020- decrease dose of acalbrutinib to 100 mg/day [sec to spon bruising]  # 11/06/2016- colonoscopy [Dr.Byrnett- 2 polyps]  #History of A. Fib-Xarelto; CKD stage II-III.   -------------------------------------------------------------------    # left ? Adnexal mass- ? Etiology; mild SUV uptake incidental [stable compared to previous PET in 2016] Previous workup 2015- vaginal ultrasound negative. MRI February 2018- approximately 3 cm in size; slow increase in the size of the left adnexal mass over at least 3 years- suspect involvement of lymphoma rather than a primary gynecologic  malignancy. 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  female pleasant patient above history of SLL/CLL currently on alcalbrutinib 100 mg once a day is here for a follow up.  Patient continues to complain of easy bruising.  Spontaneous bruising although improved but resolved.  Complains of swelling in the legs.  Denies any headaches.  No rash.   Review of Systems  Constitutional: Positive for malaise/fatigue. Negative for chills, diaphoresis, fever and weight loss.  HENT: Negative for nosebleeds and sore throat.   Eyes: Negative for double vision.  Respiratory: Negative for cough, hemoptysis, sputum production, shortness of breath and wheezing.   Cardiovascular: Positive for leg swelling. Negative for chest pain, palpitations and orthopnea.  Gastrointestinal: Positive for nausea. Negative for abdominal pain, blood in stool, constipation, diarrhea, heartburn, melena and vomiting.  Genitourinary: Negative for dysuria, frequency and urgency.  Musculoskeletal: Negative for back pain and joint pain.  Neurological: Negative for dizziness, tingling, focal weakness, weakness and headaches.  Endo/Heme/Allergies: Bruises/bleeds easily.  Psychiatric/Behavioral: Negative for depression. The patient is not nervous/anxious and does not have insomnia.       PAST MEDICAL HISTORY :  Past Medical History:  Diagnosis Date  . A-fib ([place])   . Benign neoplasm of rectum and anal canal   . 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 Hypercholesteremia   . Hypertension   . Hyperthyroidism   . Lymphoma ([place]) 2007  . Non Hodgkin's lymphoma ([place]) 01/22/2006  .  Personal history  of chemotherapy     PAST SURGICAL HISTORY :   Past Surgical History:  Procedure Laterality Date  . ABDOMINAL HYSTERECTOMY  1958  . APPENDECTOMY    . BREAST BIOPSY Right 1970   right breast biopsy with clip- neg  . BREAST BIOPSY Left 2002   left breast biopsy with clip-neg  . BREAST BIOPSY Right 2014   right breast biopsy with clip-neg  . BREAST CYST ASPIRATION Bilateral 2000   bilateral fine needle aspiration  . BREAST SURGERY Left 1990   biopsy  . BREAST SURGERY Right May 08, 2012   complex fibroadenoma without malignancy.  Jacqueline Webb CARDIAC CATHETERIZATION  2014  . COLONOSCOPY  2009, 2012    hyperplastic rectal polyp 2012 tubular adenoma of proximal ascending colon 2000 9 repeat exam due to 2017.  Jacqueline Webb COLONOSCOPY WITH PROPOFOL N/A 11/06/2016   Procedure: COLONOSCOPY WITH PROPOFOL;  Surgeon: Robert Bellow, MD;  Location: [facility] ENDOSCOPY;  Service: Endoscopy;  Laterality: N/A;  . CORONARY ARTERY BYPASS GRAFT  1999  . FLEXIBLE SIGMOIDOSCOPY  1998    FAMILY HISTORY :   Family History  Problem Relation Age of Onset  . Lung cancer Father        Died age 43  . Heart disease Mother        Died age 54  . Asthma Mother   . CAD Brother        double bypass  . Heart attack Sister   . Tuberculosis Sister        64 years old  . Lung cancer Sister        Died in her 46s  . Angina Sister   . Breast cancer Sister 20  . Rheum arthritis Sister   . Aneurysm Sister        Brain, died age 35  . Angina Sister   . Heart disease Brother        Stent placement  . Heart disease Brother        stent placement  . Cancer Other        breast cancer niece, pancreatic cancer niece  . Breast cancer Other   . Ovarian cancer Neg Hx   . Diabetes Neg Hx     SOCIAL HISTORY:   Social History   Tobacco Use  . Smoking status: Never Smoker  . Smokeless tobacco: Never Used  Substance Use Topics  . Alcohol use: Yes    Alcohol/week: 2.0 - 3.0 standard drinks    Types: 1 - 2 Glasses of wine, 1  Shots of liquor per week    Comment: daily-   . Drug use: No    ALLERGIES:  is allergic to pravastatin.  MEDICATIONS:  Current Outpatient Medications  Medication Sig Dispense Refill  . Acalabrutinib 100 MG CAPS Take 100 mg by mouth 2 (two) times daily. 60 capsule 6  . ezetimibe (ZETIA) 10 MG tablet Take 1 tablet (10 mg total) daily by mouth. 30 tablet 12  . levothyroxine (SYNTHROID, LEVOTHROID) 88 MCG tablet TAKE 1 TABLET BY MOUTH  DAILY BEFORE BREAKFAST 90 tablet 3  . lisinopril-hydrochlorothiazide (PRINZIDE,ZESTORETIC) 20-12.5 MG tablet TAKE 1 TABLET BY MOUTH  DAILY 90 tablet 3  . Melatonin 1 MG CAPS Take 1 capsule by mouth at bedtime.     . metoprolol succinate (TOPROL-XL) 25 MG 24 hr tablet Take 25 mg by mouth daily.     . ondansetron (ZOFRAN) 4 MG tablet Take 1 tablet (4 mg total) by mouth  every 8 (eight) hours as needed for nausea or vomiting. 40 tablet 3  . oxybutynin (DITROPAN) 5 MG tablet Take 1 tablet (5 mg total) by mouth 2 (two) times daily. 60 tablet 11  . XARELTO 20 MG TABS tablet Take 20 mg by mouth daily with supper.     Jacqueline Webb allopurinol (ZYLOPRIM) 300 MG tablet TAKE 1 TABLET(300 MG) BY MOUTH TWICE DAILY 180 tablet 3  . venetoclax 10 & 50 & 100 MG TBPK Take 20mg  by mouth daily for week one, 50mg  daily for week 2, 100mg  daily for week 3, 200mg  daily for week 4. Take as directed. 42 each 0   No current facility-administered medications for this visit.    Facility-Administered Medications Ordered in Other Visits  Medication Dose Route Frequency Provider Last Rate Last Dose  . heparin lock flush 100 unit/mL  500 Units Intravenous Once Charlaine Dalton R, MD      . sodium chloride flush (NS) 0.9 % injection 10 mL  10 mL Intravenous PRN Cammie Sickle, MD        PHYSICAL EXAMINATION: ECOG PERFORMANCE STATUS: 1 - Symptomatic but completely ambulatory  BP 122/73 (BP Location: Left Arm, Patient Position: Sitting, Cuff Size: Normal)   Pulse 76   Temp (!) 97.3 F (36.3  C) (Tympanic)   Resp 16   Wt 153 lb 9.6 oz (69.7 kg)   BMI 25.56 kg/m   Filed Weights   03/26/18 1102  Weight: 153 lb 9.6 oz (69.7 kg)    Physical Exam  Constitutional: She is oriented to person, place, and time and well-developed, well-nourished, and in no distress.  HENT:  Head: Normocephalic and atraumatic.  Mouth/Throat: Oropharynx is clear and moist. No oropharyngeal exudate.  Eyes: Pupils are equal, round, and reactive to light.  Neck: Normal range of motion. Neck supple.  Cardiovascular: Normal rate and regular rhythm.  Pulmonary/Chest: No respiratory distress. She has no wheezes.  Abdominal: Soft. Bowel sounds are normal. She exhibits no distension and no mass. There is no abdominal tenderness. There is no rebound and no guarding.  Musculoskeletal: Normal range of motion.        General: No tenderness or edema.  Lymphadenopathy:  Bilateral up to 1 cm lymph node noted in the axilla bil- improved.  None felt in the bilateral inguinal regions.  Neurological: She is alert and oriented to person, place, and time.  Skin: Skin is warm.  Bilateral 4 to 5 cm shoulder bruises noted.  Psychiatric: Affect normal.      LABORATORY DATA:  I have reviewed the data as listed    Component Value Date/Time   NA 137 03/26/2018 1031   NA 139 07/03/2017 1425   NA 141 05/11/2014 0853   K 3.7 03/26/2018 1031   K 3.6 05/11/2014 0853   CL 107 03/26/2018 1031   CL 103 05/11/2014 0853   CO2 24 03/26/2018 1031   CO2 29 05/11/2014 0853   GLUCOSE 109 (H) 03/26/2018 1031   GLUCOSE 107 (H) 05/11/2014 0853   BUN 22 03/26/2018 1031   BUN 19 07/03/2017 1425   BUN 22 (H) 05/11/2014 0853   CREATININE 0.97 03/26/2018 1031   CREATININE 1.07 (H) 05/11/2014 0853   CALCIUM 9.3 03/26/2018 1031   CALCIUM 10.1 05/11/2014 0853   PROT 6.2 (L) 03/26/2018 1031   PROT 6.4 07/03/2017 1425   PROT 7.3 05/11/2014 0853   ALBUMIN 3.9 03/26/2018 1031   ALBUMIN 4.3 07/03/2017 1425   ALBUMIN 4.7 05/11/2014  0853  AST 26 03/26/2018 1031   AST 29 05/11/2014 0853   ALT 24 03/26/2018 1031   ALT 20 05/11/2014 0853   ALKPHOS 59 03/26/2018 1031   ALKPHOS 47 05/11/2014 0853   BILITOT 1.2 03/26/2018 1031   BILITOT 0.5 07/03/2017 1425   BILITOT 1.0 05/11/2014 0853   GFRNONAA 56 (L) 03/26/2018 1031   GFRNONAA 51 (L) 05/11/2014 0853   GFRAA >60 03/26/2018 1031   GFRAA 59 (L) 05/11/2014 0853    No results found for: SPEP, UPEP  Lab Results  Component Value Date   WBC 4.5 03/26/2018   NEUTROABS 3.0 03/26/2018   HGB 12.1 03/26/2018   HCT 36.5 03/26/2018   MCV 89.9 03/26/2018   PLT 104 (L) 03/26/2018      Chemistry      Component Value Date/Time   NA 137 03/26/2018 1031   NA 139 07/03/2017 1425   NA 141 05/11/2014 0853   K 3.7 03/26/2018 1031   K 3.6 05/11/2014 0853   CL 107 03/26/2018 1031   CL 103 05/11/2014 0853   CO2 24 03/26/2018 1031   CO2 29 05/11/2014 0853   BUN 22 03/26/2018 1031   BUN 19 07/03/2017 1425   BUN 22 (H) 05/11/2014 0853   CREATININE 0.97 03/26/2018 1031   CREATININE 1.07 (H) 05/11/2014 0853   GLU 98 05/25/2013      Component Value Date/Time   CALCIUM 9.3 03/26/2018 1031   CALCIUM 10.1 05/11/2014 0853   ALKPHOS 59 03/26/2018 1031   ALKPHOS 47 05/11/2014 0853   AST 26 03/26/2018 1031   AST 29 05/11/2014 0853   ALT 24 03/26/2018 1031   ALT 20 05/11/2014 0853   BILITOT 1.2 03/26/2018 1031   BILITOT 0.5 07/03/2017 1425   BILITOT 1.0 05/11/2014 0853       RADIOGRAPHIC STUDIES: I have personally reviewed the radiological images as listed and agreed with the findings in the report. No results found.   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  Stable left adnexal mass/lymphoma versus benign ovarian cyst.  Currently on acalbrutinib 100 mg once  day.  # DISCONTINUE acalburtinib;  Plan Venetoclax. Out pt.   #Continues bruising easy bruising-multifactorial thrombocytopenia 88/Xarelto/acalabrutinib-monitor closely.  No concern for internal bleeding.  # Thrombocytopenia-88/ worse; sec to Acalbrutinib.   # right psoterior neck- melanoma ; Dr.Lee/[place].   # A. Fib-stable continue Xarelto [Dr.Kowalski]. stable.  ----------------------------------------------------------------------- ---   # DISPOSITION: # follow up with MD March 17th-at 8:30 am cbc/cmp/ldh/uric acid/mag/phos; IVFs  over 3 hours;sam day- 3 pm-labs- /cmp/ldh/uric acid/mag/phos;  # follow up with MD on March 24th- 8:30 am cbc/cmp/ldh/uric acid/mag/phos/  IVFs  over 3 hours;sam day- 3 pm-labs- /cmp/ldh/uric acid/mag/phos;- Dr.B   No orders of the defined types were placed in this encounter.  All questions were answered. The patient knows to call the clinic with any problems, questions or concerns.      Cammie Sickle, MD 03/26/2018 8:01 PM

## 2018-03-27 NOTE — Telephone Encounter (Signed)
Scheduled shipment of medication for 3/9 for delivery 3/10.  Patient knows to bring medication to infusion appointment for administration.

## 2018-03-28 ENCOUNTER — Other Ambulatory Visit: Payer: Self-pay | Admitting: Pharmacist

## 2018-03-31 MED FILL — VENCLEXTA STARTING PACK: 10 & 50 & 1 | 28 days supply | Qty: 42 | Fill #0

## 2018-04-07 ENCOUNTER — Other Ambulatory Visit: Payer: Self-pay | Admitting: *Deleted

## 2018-04-07 ENCOUNTER — Telehealth: Payer: Self-pay | Admitting: Pharmacist

## 2018-04-07 DIAGNOSIS — C8308 Small cell B-cell lymphoma, lymph nodes of multiple sites: Secondary | ICD-10-CM

## 2018-04-07 NOTE — Telephone Encounter (Signed)
Oral Chemotherapy Pharmacist Encounter   Called Ms. Ramos to remind her to bring her venetoclax with her to her appt tomorrow 04/08/18. I spoke with her husband and left a message with him.  Darl Pikes, PharmD, BCPS, [facility] Hematology/Oncology Clinical Pharmacist [facility]/HP/AP Oral [facility] [phone]  04/07/2018 1:41 PM

## 2018-04-08 ENCOUNTER — Other Ambulatory Visit: Payer: Self-pay

## 2018-04-08 ENCOUNTER — Inpatient Hospital Stay: Payer: Medicare Other

## 2018-04-08 ENCOUNTER — Telehealth: Payer: Self-pay | Admitting: Pharmacist

## 2018-04-08 ENCOUNTER — Ambulatory Visit: Payer: Medicare Other

## 2018-04-08 ENCOUNTER — Inpatient Hospital Stay (HOSPITAL_BASED_OUTPATIENT_CLINIC_OR_DEPARTMENT_OTHER): Payer: Medicare Other | Admitting: Internal Medicine

## 2018-04-08 ENCOUNTER — Encounter: Payer: Self-pay | Admitting: Internal Medicine

## 2018-04-08 ENCOUNTER — Other Ambulatory Visit: Payer: Self-pay | Admitting: *Deleted

## 2018-04-08 VITALS — BP 126/78 | HR 81 | Temp 97.7°F | Resp 16 | Wt 152.8 lb

## 2018-04-08 DIAGNOSIS — I4891 Unspecified atrial fibrillation: Secondary | ICD-10-CM | POA: Diagnosis not present

## 2018-04-08 DIAGNOSIS — R1900 Intra-abdominal and pelvic swelling, mass and lump, unspecified site: Secondary | ICD-10-CM | POA: Diagnosis not present

## 2018-04-08 DIAGNOSIS — C8308 Small cell B-cell lymphoma, lymph nodes of multiple sites: Secondary | ICD-10-CM

## 2018-04-08 DIAGNOSIS — D6959 Other secondary thrombocytopenia: Secondary | ICD-10-CM | POA: Diagnosis not present

## 2018-04-08 DIAGNOSIS — R599 Enlarged lymph nodes, unspecified: Secondary | ICD-10-CM

## 2018-04-08 DIAGNOSIS — D696 Thrombocytopenia, unspecified: Secondary | ICD-10-CM | POA: Diagnosis not present

## 2018-04-08 DIAGNOSIS — Z7189 Other specified counseling: Secondary | ICD-10-CM

## 2018-04-08 DIAGNOSIS — R233 Spontaneous ecchymoses: Secondary | ICD-10-CM | POA: Diagnosis not present

## 2018-04-08 LAB — CBC WITH DIFFERENTIAL/PLATELET
ABS IMMATURE GRANULOCYTES: 0.01 10*3/uL (ref 0.00–0.07)
Abs Immature Granulocytes: 0.01 10*3/uL (ref 0.00–0.07)
Basophils Absolute: 0.1 10*3/uL (ref 0.0–0.1)
Basophils Absolute: 0.1 10*3/uL (ref 0.0–0.1)
Basophils Relative: 2 %
Basophils Relative: 1 %
Eosinophils Absolute: 0.2 10*3/uL (ref 0.0–0.5)
Eosinophils Absolute: 0.2 10*3/uL (ref 0.0–0.5)
Eosinophils Relative: 5 %
Eosinophils Relative: 5 %
HCT: 40.7 % (ref 36.0–46.0)
HCT: 38.2 % (ref 36.0–46.0)
Hemoglobin: 13.3 g/dL (ref 12.0–15.0)
Hemoglobin: 12.8 g/dL (ref 12.0–15.0)
Immature Granulocytes: 0 %
Immature Granulocytes: 0 %
Lymphocytes Relative: 22 %
Lymphocytes Relative: 27 %
Lymphs Abs: 0.8 10*3/uL (ref 0.7–4.0)
Lymphs Abs: 1 10*3/uL (ref 0.7–4.0)
MCH: 29.7 pg (ref 26.0–34.0)
MCH: 30.5 pg (ref 26.0–34.0)
MCHC: 32.7 g/dL (ref 30.0–36.0)
MCHC: 33.5 g/dL (ref 30.0–36.0)
MCV: 90.8 fL (ref 80.0–100.0)
MCV: 91.2 fL (ref 80.0–100.0)
MONO ABS: 0.4 10*3/uL (ref 0.1–1.0)
Monocytes Absolute: 0.4 10*3/uL (ref 0.1–1.0)
Monocytes Relative: 11 %
Monocytes Relative: 10 %
Neutro Abs: 2.3 10*3/uL (ref 1.7–7.7)
Neutro Abs: 2 10*3/uL (ref 1.7–7.7)
Neutrophils Relative %: 60 %
Neutrophils Relative %: 57 %
Platelets: 80 10*3/uL — ABNORMAL LOW (ref 150–400)
Platelets: 76 10*3/uL — ABNORMAL LOW (ref 150–400)
RBC: 4.48 MIL/uL (ref 3.87–5.11)
RBC: 4.19 MIL/uL (ref 3.87–5.11)
RDW: 15.6 % — ABNORMAL HIGH (ref 11.5–15.5)
RDW: 15.8 % — AB (ref 11.5–15.5)
WBC: 3.7 10*3/uL — ABNORMAL LOW (ref 4.0–10.5)
WBC: 3.7 10*3/uL — AB (ref 4.0–10.5)
nRBC: 0 % (ref 0.0–0.2)
nRBC: 0 % (ref 0.0–0.2)

## 2018-04-08 LAB — PHOSPHORUS
Phosphorus: 3.7 mg/dL (ref 2.5–4.6)
Phosphorus: 3.2 mg/dL (ref 2.5–4.6)

## 2018-04-08 LAB — COMPREHENSIVE METABOLIC PANEL
ALK PHOS: 62 U/L (ref 38–126)
ALK PHOS: 62 U/L (ref 38–126)
ALT: 21 U/L (ref 0–44)
ALT: 22 U/L (ref 0–44)
AST: 26 U/L (ref 15–41)
AST: 33 U/L (ref 15–41)
Albumin: 4.1 g/dL (ref 3.5–5.0)
Albumin: 3.8 g/dL (ref 3.5–5.0)
Anion gap: 8 (ref 5–15)
Anion gap: 7 (ref 5–15)
BUN: 18 mg/dL (ref 8–23)
BUN: 16 mg/dL (ref 8–23)
CALCIUM: 9.7 mg/dL (ref 8.9–10.3)
CALCIUM: 9.3 mg/dL (ref 8.9–10.3)
CO2: 26 mmol/L (ref 22–32)
CO2: 25 mmol/L (ref 22–32)
Chloride: 105 mmol/L (ref 98–111)
Chloride: 109 mmol/L (ref 98–111)
Creatinine, Ser: 1.04 mg/dL — ABNORMAL HIGH (ref 0.44–1.00)
Creatinine, Ser: 1.12 mg/dL — ABNORMAL HIGH (ref 0.44–1.00)
GFR calc Af Amer: 59 mL/min — ABNORMAL LOW (ref >60)
GFR calc Af Amer: 54 mL/min — ABNORMAL LOW (ref >60)
GFR calc non Af Amer: 47 mL/min — ABNORMAL LOW (ref >60)
GFR, EST NON AFRICAN AMERICAN: 51 mL/min — AB (ref >60)
Glucose, Bld: 116 mg/dL — ABNORMAL HIGH (ref 70–99)
Glucose, Bld: 127 mg/dL — ABNORMAL HIGH (ref 70–99)
Potassium: 3.8 mmol/L (ref 3.5–5.1)
Potassium: 3.6 mmol/L (ref 3.5–5.1)
Sodium: 139 mmol/L (ref 135–145)
Sodium: 141 mmol/L (ref 135–145)
Total Bilirubin: 0.9 mg/dL (ref 0.3–1.2)
Total Bilirubin: 0.7 mg/dL (ref 0.3–1.2)
Total Protein: 6.5 g/dL (ref 6.5–8.1)
Total Protein: 6.2 g/dL — ABNORMAL LOW (ref 6.5–8.1)

## 2018-04-08 LAB — URIC ACID
Uric Acid, Serum: 1.4 mg/dL — ABNORMAL LOW (ref 2.5–7.1)
Uric Acid, Serum: 1.3 mg/dL — ABNORMAL LOW (ref 2.5–7.1)

## 2018-04-08 LAB — LACTATE DEHYDROGENASE: LDH: 150 U/L (ref 98–192)

## 2018-04-08 LAB — MAGNESIUM
Magnesium: 1.6 mg/dL — ABNORMAL LOW (ref 1.7–2.4)
Magnesium: 1.7 mg/dL (ref 1.7–2.4)

## 2018-04-08 MED ORDER — SODIUM CHLORIDE 0.9 % IV SOLN: Freq: Once | INTRAVENOUS | Status: DC

## 2018-04-08 MED ORDER — ONDANSETRON HCL 4 MG/2ML IJ SOLN
8.0000 mg | Freq: Once | INTRAMUSCULAR | Status: AC
  Administered 2018-04-08: 8 mg via INTRAVENOUS
  Filled 2018-04-08: qty 4

## 2018-04-08 MED ORDER — SODIUM CHLORIDE 0.9 % IV SOLN
Freq: Once | INTRAVENOUS | Status: AC
  Administered 2018-04-08: 10:00:00 via INTRAVENOUS
  Filled 2018-04-08: qty 250

## 2018-04-08 NOTE — Assessment & Plan Note (Addendum)
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  Stable left adnexal mass/lymphoma versus benign ovarian cyst.  # DISCONTINUE acalburtinib; poor tolerance secondary to ongoing bruising/on Xarelto.   # plan to start Venetoclax #1 week ramp up today.  Again reviewed the potential side effects.  #Tumor lysis prophylaxis -proceed with IV fluids today; allopurinol.  Check labs later in the afternoon.  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   # A. Fib-stable continue Xarelto [Dr.Kowalski]. STABLE.   # Educated the patient regarding novel coronavirus-modes of transmission/risks; and measures to avoid infection.   # DISPOSITION: # IVFs/ oral chemo today # labs tomorrow- cbc/cmp/ldh/uric acid/mag/phos in AM  # follow up with MD on March 24th- 8:30 am cbc/cmp/ldh/uric acid/mag/phos/  IVFs  over 3 hours; same day- 3 pm-labs- /cmp/ldh/uric acid/mag/phos;- Dr.B  Addendum: Labs later in the afternoon-no evidence of tumor lysis.  Will monitor a.m. labs on 3/18.

## 2018-04-08 NOTE — Progress Notes (Signed)
[place] OFFICE PROGRESS NOTE  Patient Care Team: [name], MD as PCP - General (Unknown Physician Specialty) Bary Castilla, Forest Gleason, MD (General Surgery) Forest Gleason, MD (Inactive) (Oncology) Corey Skains, MD as Consulting Physician (Cardiology) Anell Barr, OD as Consulting Physician (Optometry)  Cancer Staging No matching staging information was found for the patient.   Oncology History   1. Lymphoma low-grade, status Rituxan therapy. 2. Recurrent disease by clinical examination in December of 2012 3. Ovarian mass on PET scan in December of 2012 (normal CA 125). Ovarian mass has resolved after rituximab therapy. 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  Patient had total 5 cycles of chemotherapy with bendamustine and rituximab and 6 cycle was omitted because of significant side effects with and weakness and fatigue.  # OCT 2016- PET significant response; ON surveillance  # 18th OCT 2018- [place];SEP 2018-/OCT 2018 CT scan- progression; s/p 6 cycles [#6 in April 2019]  # JAN 2020-CT scan shows progressive disease; start a acalabrutinib 100mg  BID;# feb 6th2020- decrease dose of acalbrutinib to 100 mg/day [sec to spon bruising] March 1st week- STOPPED [place] sec to spon brusing  # MARCH 17th 2020- Start Venetoclax [out pt ramp up]    # 11/06/2016- colonoscopy [Dr.Byrnett- 2 polyps]  #History of A. Fib-Xarelto; CKD stage II-III.   # #Right posterior neck melanoma stage I [ Dr.Lee/Elkins]..   -------------------------------------------------------------------    # left ? Adnexal mass- ? Etiology; mild SUV uptake incidental [stable compared to previous PET in 2016] Previous workup 2015- vaginal ultrasound negative. MRI February 2018-  approximately 3 cm in size; slow increase in the size of the left adnexal mass over at least 3 years- suspect involvement of lymphoma rather than a primary gynecologic malignancy. --------------------------------------------------------------   DIAGNOSIS: SMALL LYMPHOCYTIC LEUKEMIA  STAGE:  IV    ;GOALS: control/pallaitive  CURRENT/MOST RECENT THERAPY: Venatoclax.      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  female pleasant patient above history of SLL/CLL most recently on acalabrutinib is here to proceed with venetoclax.  Patient was taken off acalabrutinib because of easy bruising/spontaneous bruising.  Since coming off acalabrutinib bruising has improved.  She continues to be on Xarelto.  Complains of mild swelling in the legs.  Review of Systems  Constitutional: Positive for malaise/fatigue. Negative for chills, diaphoresis, fever and weight loss.  HENT: Negative for nosebleeds and sore throat.   Eyes: Negative for double vision.  Respiratory: Negative for cough, hemoptysis, sputum production, shortness of breath and wheezing.   Cardiovascular: Positive for leg swelling. Negative for chest pain, palpitations and orthopnea.  Gastrointestinal: Positive for nausea. Negative for abdominal pain, blood in stool, constipation, diarrhea, heartburn, melena and vomiting.  Genitourinary: Negative for dysuria, frequency and urgency.  Musculoskeletal: Negative for back pain and joint pain.  Neurological: Negative for dizziness, tingling, focal weakness, weakness and headaches.  Endo/Heme/Allergies: Bruises/bleeds easily.  Psychiatric/Behavioral: Negative for depression. The patient is not nervous/anxious and does not have insomnia.       PAST MEDICAL HISTORY :  Past Medical History:  Diagnosis Date  . A-fib ([place])   . Benign neoplasm of rectum and anal canal   . Chronic leukemia of unspecified cell type,  without  mention of having achieved remission   . Hypercholesteremia   . Hypertension   . Hyperthyroidism   . Lymphoma ([place]) 2007  . Non Hodgkin's lymphoma ([place]) 01/22/2006  . Personal history of chemotherapy     PAST SURGICAL HISTORY :   Past Surgical History:  Procedure Laterality Date  . ABDOMINAL HYSTERECTOMY  1958  . APPENDECTOMY    . BREAST BIOPSY Right 1970   right breast biopsy with clip- neg  . BREAST BIOPSY Left 2002   left breast biopsy with clip-neg  . BREAST BIOPSY Right 2014   right breast biopsy with clip-neg  . BREAST CYST ASPIRATION Bilateral 2000   bilateral fine needle aspiration  . BREAST SURGERY Left 1990   biopsy  . BREAST SURGERY Right May 08, 2012   complex fibroadenoma without malignancy.  [name] CARDIAC CATHETERIZATION  2014  . COLONOSCOPY  2009, 2012    hyperplastic rectal polyp 2012 tubular adenoma of proximal ascending colon 2000 9 repeat exam due to 2017.  [name] COLONOSCOPY WITH PROPOFOL N/A 11/06/2016   Procedure: COLONOSCOPY WITH PROPOFOL;  Surgeon: Robert Bellow, MD;  Location: [facility] ENDOSCOPY;  Service: Endoscopy;  Laterality: N/A;  . CORONARY ARTERY BYPASS GRAFT  1999  . FLEXIBLE SIGMOIDOSCOPY  1998    FAMILY HISTORY :   Family History  Problem Relation Age of Onset  . Lung cancer Father        Died age 56  . Heart disease Mother        Died age 13  . Asthma Mother   . CAD Brother        double bypass  . Heart attack Sister   . Tuberculosis Sister        73 years old  . Lung cancer Sister        Died in her 37s  . Angina Sister   . Breast cancer Sister 42  . Rheum arthritis Sister   . Aneurysm Sister        Brain, died age 21  . Angina Sister   . Heart disease Brother        Stent placement  . Heart disease Brother        stent placement  . Cancer Other        breast cancer niece, pancreatic cancer niece  . Breast cancer Other   . Ovarian cancer Neg Hx   . Diabetes Neg Hx     SOCIAL HISTORY:   Social History    Tobacco Use  . Smoking status: Never Smoker  . Smokeless tobacco: Never Used  Substance Use Topics  . Alcohol use: Yes    Alcohol/week: 2.0 - 3.0 standard drinks    Types: 1 - 2 Glasses of wine, 1 Shots of liquor per week    Comment: daily-   . Drug use: No    ALLERGIES:  is allergic to pravastatin.  MEDICATIONS:  Current Outpatient Medications  Medication Sig Dispense Refill  . allopurinol (ZYLOPRIM) 300 MG tablet TAKE 1 TABLET(300 MG) BY MOUTH TWICE DAILY 180 tablet 3  . ezetimibe (ZETIA) 10 MG tablet Take 1 tablet (10 mg total) daily by mouth. 30 tablet 12  . levothyroxine (SYNTHROID, LEVOTHROID) 88 MCG tablet TAKE 1 TABLET BY MOUTH  DAILY BEFORE BREAKFAST 90 tablet 3  . lisinopril-hydrochlorothiazide (PRINZIDE,ZESTORETIC) 20-12.5 MG tablet TAKE 1 TABLET BY MOUTH  DAILY 90 tablet 3  . Melatonin 1 MG CAPS Take 1 capsule by mouth at bedtime.     . metoprolol  succinate (TOPROL-XL) 25 MG 24 hr tablet Take 25 mg by mouth daily.     . ondansetron (ZOFRAN) 4 MG tablet Take 1 tablet (4 mg total) by mouth every 8 (eight) hours as needed for nausea or vomiting. 40 tablet 3  . oxybutynin (DITROPAN) 5 MG tablet Take 1 tablet (5 mg total) by mouth 2 (two) times daily. 60 tablet 11  . venetoclax 10 & 50 & 100 MG TBPK Take 20mg  by mouth daily for week one, 50mg  daily for week 2, 100mg  daily for week 3, 200mg  daily for week 4. Take as directed. 42 each 0  . XARELTO 20 MG TABS tablet Take 20 mg by mouth daily with supper.      No current facility-administered medications for this visit.    Facility-Administered Medications Ordered in Other Visits  Medication Dose Route Frequency Provider Last Rate Last Dose  . heparin lock flush 100 unit/mL  500 Units Intravenous Once Jacqueline Dalton R, MD      . sodium chloride flush (NS) 0.9 % injection 10 mL  10 mL Intravenous PRN Jacqueline Sickle, MD        PHYSICAL EXAMINATION: ECOG PERFORMANCE STATUS: 1 - Symptomatic but completely  ambulatory  BP 126/78 (BP Location: Left Arm, Patient Position: Sitting, Cuff Size: Normal)   Pulse 81   Temp 97.7 F (36.5 C) (Tympanic)   Resp 16   Wt 152 lb 12.8 oz (69.3 kg)   BMI 25.43 kg/m   Filed Weights   04/08/18 0855  Weight: 152 lb 12.8 oz (69.3 kg)    Physical Exam  Constitutional: She is oriented to person, place, and time and well-developed, well-nourished, and in no distress.  HENT:  Head: Normocephalic and atraumatic.  Mouth/Throat: Oropharynx is clear and moist. No oropharyngeal exudate.  Eyes: Pupils are equal, round, and reactive to light.  Neck: Normal range of motion. Neck supple.  Cardiovascular: Normal rate and regular rhythm.  Pulmonary/Chest: No respiratory distress. She has no wheezes.  Abdominal: Soft. Bowel sounds are normal. She exhibits no distension and no mass. There is no abdominal tenderness. There is no rebound and no guarding.  Musculoskeletal: Normal range of motion.        General: Edema present. No tenderness.  Lymphadenopathy:  Bilateral up to 1 cm lymph node noted in the axilla bil- improved.  None felt in the bilateral inguinal regions.  Neurological: She is alert and oriented to person, place, and time.  Skin: Skin is warm.  Psychiatric: Affect normal.      LABORATORY DATA:  I have reviewed the data as listed    Component Value Date/Time   NA 141 04/08/2018 1501   NA 139 07/03/2017 1425   NA 141 05/11/2014 0853   K 3.6 04/08/2018 1501   K 3.6 05/11/2014 0853   CL 109 04/08/2018 1501   CL 103 05/11/2014 0853   CO2 25 04/08/2018 1501   CO2 29 05/11/2014 0853   GLUCOSE 127 (H) 04/08/2018 1501   GLUCOSE 107 (H) 05/11/2014 0853   BUN 16 04/08/2018 1501   BUN 19 07/03/2017 1425   BUN 22 (H) 05/11/2014 0853   CREATININE 1.12 (H) 04/08/2018 1501   CREATININE 1.07 (H) 05/11/2014 0853   CALCIUM 9.3 04/08/2018 1501   CALCIUM 10.1 05/11/2014 0853   PROT 6.2 (L) 04/08/2018 1501   PROT 6.4 07/03/2017 1425   PROT 7.3 05/11/2014  0853   ALBUMIN 3.8 04/08/2018 1501   ALBUMIN 4.3 07/03/2017 1425   ALBUMIN  4.7 05/11/2014 0853   AST 33 04/08/2018 1501   AST 29 05/11/2014 0853   ALT 22 04/08/2018 1501   ALT 20 05/11/2014 0853   ALKPHOS 62 04/08/2018 1501   ALKPHOS 47 05/11/2014 0853   BILITOT 0.7 04/08/2018 1501   BILITOT 0.5 07/03/2017 1425   BILITOT 1.0 05/11/2014 0853   GFRNONAA 47 (L) 04/08/2018 1501   GFRNONAA 51 (L) 05/11/2014 0853   GFRAA 54 (L) 04/08/2018 1501   GFRAA 59 (L) 05/11/2014 0853    No results found for: SPEP, UPEP  Lab Results  Component Value Date   WBC 3.7 (L) 04/08/2018   NEUTROABS 2.0 04/08/2018   HGB 12.8 04/08/2018   HCT 38.2 04/08/2018   MCV 91.2 04/08/2018   PLT 76 (L) 04/08/2018      Chemistry      Component Value Date/Time   NA 141 04/08/2018 1501   NA 139 07/03/2017 1425   NA 141 05/11/2014 0853   K 3.6 04/08/2018 1501   K 3.6 05/11/2014 0853   CL 109 04/08/2018 1501   CL 103 05/11/2014 0853   CO2 25 04/08/2018 1501   CO2 29 05/11/2014 0853   BUN 16 04/08/2018 1501   BUN 19 07/03/2017 1425   BUN 22 (H) 05/11/2014 0853   CREATININE 1.12 (H) 04/08/2018 1501   CREATININE 1.07 (H) 05/11/2014 0853   GLU 98 05/25/2013      Component Value Date/Time   CALCIUM 9.3 04/08/2018 1501   CALCIUM 10.1 05/11/2014 0853   ALKPHOS 62 04/08/2018 1501   ALKPHOS 47 05/11/2014 0853   AST 33 04/08/2018 1501   AST 29 05/11/2014 0853   ALT 22 04/08/2018 1501   ALT 20 05/11/2014 0853   BILITOT 0.7 04/08/2018 1501   BILITOT 0.5 07/03/2017 1425   BILITOT 1.0 05/11/2014 0853       RADIOGRAPHIC STUDIES: I have personally reviewed the radiological images as listed and agreed with the findings in the report. No results found.   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   Stable left adnexal mass/lymphoma versus benign ovarian cyst.  # DISCONTINUE acalburtinib; poor tolerance secondary to ongoing bruising/on Xarelto.   # plan to start Venetoclax #1 week ramp up today.  Again reviewed the potential side effects.  #Tumor lysis prophylaxis -proceed with IV fluids today; allopurinol.  Check labs later in the afternoon.  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   # A. Fib-stable continue Xarelto [Dr.Kowalski]. STABLE.   # Educated the patient regarding novel coronavirus-modes of transmission/risks; and measures to avoid infection.   # DISPOSITION: # IVFs/ oral chemo today # labs tomorrow- cbc/cmp/ldh/uric acid/mag/phos in AM  # follow up with MD on March 24th- 8:30 am cbc/cmp/ldh/uric acid/mag/phos/  IVFs  over 3 hours; same day- 3 pm-labs- /cmp/ldh/uric acid/mag/phos;- Dr.B  Addendum: Labs later in the afternoon-no evidence of tumor lysis.  Will monitor a.m. labs on 3/18.      Orders Placed This Encounter  Procedures  . Lactate dehydrogenase    Standing Status:   Future    Number of Occurrences:   1    Standing Expiration Date:   04/08/2019   All questions were answered. The patient knows to call the clinic with any problems, questions or concerns.      Jacqueline Sickle, MD 04/09/2018 8:28 AM

## 2018-04-08 NOTE — Telephone Encounter (Signed)
Oral Chemotherapy Pharmacist Encounter   Venetoclax Cycle 1, day 1  Reviewed CBC, CMP  Patient brought her Venetoclax starter kit with her to clinic. Reviewed the dosing and monitoring plan for outpatient venetoclax. Provided patient with calendars. Met the patient in infusion for her first dose of venetoclax 20 mg. She will continue to get fluids. She will have PM labs drawn today and 24 hour labs drawn tomorrow 04/09/18.  Darl Pikes, PharmD, BCPS, [facility] Hematology/Oncology Clinical Pharmacist [facility]/HP/AP Oral [facility] [phone]  04/08/2018 11:39 AM

## 2018-04-09 ENCOUNTER — Inpatient Hospital Stay: Payer: Medicare Other

## 2018-04-09 DIAGNOSIS — D6959 Other secondary thrombocytopenia: Secondary | ICD-10-CM | POA: Diagnosis not present

## 2018-04-09 DIAGNOSIS — C8308 Small cell B-cell lymphoma, lymph nodes of multiple sites: Secondary | ICD-10-CM | POA: Diagnosis not present

## 2018-04-09 DIAGNOSIS — I4891 Unspecified atrial fibrillation: Secondary | ICD-10-CM | POA: Diagnosis not present

## 2018-04-09 DIAGNOSIS — R599 Enlarged lymph nodes, unspecified: Secondary | ICD-10-CM | POA: Diagnosis not present

## 2018-04-09 DIAGNOSIS — R233 Spontaneous ecchymoses: Secondary | ICD-10-CM | POA: Diagnosis not present

## 2018-04-09 DIAGNOSIS — R1900 Intra-abdominal and pelvic swelling, mass and lump, unspecified site: Secondary | ICD-10-CM | POA: Diagnosis not present

## 2018-04-09 LAB — COMPREHENSIVE METABOLIC PANEL
ALT: 25 U/L (ref 0–44)
AST: 32 U/L (ref 15–41)
Albumin: 3.6 g/dL (ref 3.5–5.0)
Alkaline Phosphatase: 61 U/L (ref 38–126)
Anion gap: 6 (ref 5–15)
BUN: 24 mg/dL — ABNORMAL HIGH (ref 8–23)
CO2: 24 mmol/L (ref 22–32)
Calcium: 9.2 mg/dL (ref 8.9–10.3)
Chloride: 108 mmol/L (ref 98–111)
Creatinine, Ser: 1.2 mg/dL — ABNORMAL HIGH (ref 0.44–1.00)
GFR calc non Af Amer: 43 mL/min — ABNORMAL LOW (ref >60)
GFR, EST AFRICAN AMERICAN: 50 mL/min — AB (ref >60)
Glucose, Bld: 105 mg/dL — ABNORMAL HIGH (ref 70–99)
Potassium: 3.7 mmol/L (ref 3.5–5.1)
SODIUM: 138 mmol/L (ref 135–145)
Total Bilirubin: 0.8 mg/dL (ref 0.3–1.2)
Total Protein: 6.3 g/dL — ABNORMAL LOW (ref 6.5–8.1)

## 2018-04-09 LAB — PHOSPHORUS: Phosphorus: 3.1 mg/dL (ref 2.5–4.6)

## 2018-04-09 LAB — URIC ACID: URIC ACID, SERUM: 1.2 mg/dL — AB (ref 2.5–7.1)

## 2018-04-09 LAB — MAGNESIUM: Magnesium: 1.5 mg/dL — ABNORMAL LOW (ref 1.7–2.4)

## 2018-04-09 LAB — LACTATE DEHYDROGENASE: LDH: 152 U/L (ref 98–192)

## 2018-04-09 NOTE — Telephone Encounter (Signed)
Oral Chemotherapy Pharmacist Encounter   Venetoclax Cycle 1, day 2  Reviewed 3/17 PM CBC/CMP/phos/uric acid and 3/18 AM CBC/CMP/phos/uric acid   Spoke with Dr. Rogue Bussing and patient given the okay to proceed with Cycle 1 Day 2 20mg  dose of venetoclax.  Jacqueline Webb will return to clinic next Tuesday 04/15/18 for her next dose escalation.  Darl Pikes, PharmD, BCPS, [facility] Hematology/Oncology Clinical Pharmacist [facility]/HP/AP Oral [place] [facility] [phone]  04/09/2018 2:28 PM

## 2018-04-14 ENCOUNTER — Other Ambulatory Visit: Payer: Self-pay

## 2018-04-15 ENCOUNTER — Telehealth: Payer: Self-pay | Admitting: *Deleted

## 2018-04-15 ENCOUNTER — Encounter: Payer: Self-pay | Admitting: *Deleted

## 2018-04-15 ENCOUNTER — Telehealth: Payer: Self-pay | Admitting: Pharmacist

## 2018-04-15 ENCOUNTER — Encounter: Payer: Self-pay | Admitting: Internal Medicine

## 2018-04-15 ENCOUNTER — Other Ambulatory Visit: Payer: Self-pay

## 2018-04-15 ENCOUNTER — Inpatient Hospital Stay: Payer: Medicare Other

## 2018-04-15 ENCOUNTER — Inpatient Hospital Stay (HOSPITAL_BASED_OUTPATIENT_CLINIC_OR_DEPARTMENT_OTHER): Payer: Medicare Other | Admitting: Internal Medicine

## 2018-04-15 DIAGNOSIS — D696 Thrombocytopenia, unspecified: Secondary | ICD-10-CM | POA: Diagnosis not present

## 2018-04-15 DIAGNOSIS — I4891 Unspecified atrial fibrillation: Secondary | ICD-10-CM | POA: Diagnosis not present

## 2018-04-15 DIAGNOSIS — R1900 Intra-abdominal and pelvic swelling, mass and lump, unspecified site: Secondary | ICD-10-CM | POA: Diagnosis not present

## 2018-04-15 DIAGNOSIS — C8308 Small cell B-cell lymphoma, lymph nodes of multiple sites: Secondary | ICD-10-CM

## 2018-04-15 DIAGNOSIS — Z7189 Other specified counseling: Secondary | ICD-10-CM

## 2018-04-15 DIAGNOSIS — R233 Spontaneous ecchymoses: Secondary | ICD-10-CM | POA: Diagnosis not present

## 2018-04-15 DIAGNOSIS — R599 Enlarged lymph nodes, unspecified: Secondary | ICD-10-CM

## 2018-04-15 DIAGNOSIS — D6959 Other secondary thrombocytopenia: Secondary | ICD-10-CM | POA: Diagnosis not present

## 2018-04-15 LAB — CBC WITH DIFFERENTIAL/PLATELET
Abs Immature Granulocytes: 0.01 10*3/uL (ref 0.00–0.07)
Abs Immature Granulocytes: 0.01 10*3/uL (ref 0.00–0.07)
BASOS PCT: 1 %
Basophils Absolute: 0.1 10*3/uL (ref 0.0–0.1)
Basophils Absolute: 0 10*3/uL (ref 0.0–0.1)
Basophils Relative: 1 %
Eosinophils Absolute: 0.2 10*3/uL (ref 0.0–0.5)
Eosinophils Absolute: 0.1 10*3/uL (ref 0.0–0.5)
Eosinophils Relative: 4 %
Eosinophils Relative: 3 %
HCT: 38.8 % (ref 36.0–46.0)
HEMATOCRIT: 37.4 % (ref 36.0–46.0)
Hemoglobin: 12.8 g/dL (ref 12.0–15.0)
Hemoglobin: 12.4 g/dL (ref 12.0–15.0)
Immature Granulocytes: 0 %
Immature Granulocytes: 0 %
LYMPHS ABS: 1 10*3/uL (ref 0.7–4.0)
Lymphocytes Relative: 23 %
Lymphocytes Relative: 27 %
Lymphs Abs: 0.8 10*3/uL (ref 0.7–4.0)
MCH: 29.8 pg (ref 26.0–34.0)
MCH: 30.3 pg (ref 26.0–34.0)
MCHC: 33 g/dL (ref 30.0–36.0)
MCHC: 33.2 g/dL (ref 30.0–36.0)
MCV: 90.4 fL (ref 80.0–100.0)
MCV: 91.4 fL (ref 80.0–100.0)
Monocytes Absolute: 0.4 10*3/uL (ref 0.1–1.0)
Monocytes Absolute: 0.4 10*3/uL (ref 0.1–1.0)
Monocytes Relative: 11 %
Monocytes Relative: 10 %
NRBC: 0 % (ref 0.0–0.2)
Neutro Abs: 2.2 10*3/uL (ref 1.7–7.7)
Neutro Abs: 2.2 10*3/uL (ref 1.7–7.7)
Neutrophils Relative %: 61 %
Neutrophils Relative %: 59 %
PLATELETS: 72 10*3/uL — AB (ref 150–400)
Platelets: 80 10*3/uL — ABNORMAL LOW (ref 150–400)
RBC: 4.29 MIL/uL (ref 3.87–5.11)
RBC: 4.09 MIL/uL (ref 3.87–5.11)
RDW: 15 % (ref 11.5–15.5)
RDW: 15.2 % (ref 11.5–15.5)
WBC: 3.6 10*3/uL — ABNORMAL LOW (ref 4.0–10.5)
WBC: 3.8 10*3/uL — ABNORMAL LOW (ref 4.0–10.5)
nRBC: 0 % (ref 0.0–0.2)

## 2018-04-15 LAB — MAGNESIUM
Magnesium: 1.8 mg/dL (ref 1.7–2.4)
Magnesium: 1.7 mg/dL (ref 1.7–2.4)

## 2018-04-15 LAB — BASIC METABOLIC PANEL
Anion gap: 7 (ref 5–15)
BUN: 17 mg/dL (ref 8–23)
CHLORIDE: 112 mmol/L — AB (ref 98–111)
CO2: 22 mmol/L (ref 22–32)
CREATININE: 0.93 mg/dL (ref 0.44–1.00)
Calcium: 9.4 mg/dL (ref 8.9–10.3)
GFR calc Af Amer: >60 mL/min (ref >60)
GFR calc non Af Amer: 58 mL/min — ABNORMAL LOW (ref >60)
Glucose, Bld: 110 mg/dL — ABNORMAL HIGH (ref 70–99)
Potassium: 3.9 mmol/L (ref 3.5–5.1)
SODIUM: 141 mmol/L (ref 135–145)

## 2018-04-15 LAB — URIC ACID
URIC ACID, SERUM: 1.3 mg/dL — AB (ref 2.5–7.1)
Uric Acid, Serum: 1.2 mg/dL — ABNORMAL LOW (ref 2.5–7.1)

## 2018-04-15 LAB — COMPREHENSIVE METABOLIC PANEL
ALT: 19 U/L (ref 0–44)
ANION GAP: 6 (ref 5–15)
AST: 23 U/L (ref 15–41)
Albumin: 3.8 g/dL (ref 3.5–5.0)
Alkaline Phosphatase: 57 U/L (ref 38–126)
BUN: 17 mg/dL (ref 8–23)
CO2: 25 mmol/L (ref 22–32)
Calcium: 9.8 mg/dL (ref 8.9–10.3)
Chloride: 108 mmol/L (ref 98–111)
Creatinine, Ser: 1.04 mg/dL — ABNORMAL HIGH (ref 0.44–1.00)
GFR calc Af Amer: 59 mL/min — ABNORMAL LOW (ref >60)
GFR calc non Af Amer: 51 mL/min — ABNORMAL LOW (ref >60)
Glucose, Bld: 117 mg/dL — ABNORMAL HIGH (ref 70–99)
Potassium: 3.7 mmol/L (ref 3.5–5.1)
Sodium: 139 mmol/L (ref 135–145)
Total Bilirubin: 0.8 mg/dL (ref 0.3–1.2)
Total Protein: 6.4 g/dL — ABNORMAL LOW (ref 6.5–8.1)

## 2018-04-15 LAB — LACTATE DEHYDROGENASE: LDH: 152 U/L (ref 98–192)

## 2018-04-15 LAB — PHOSPHORUS
Phosphorus: 3.6 mg/dL (ref 2.5–4.6)
Phosphorus: 3.8 mg/dL (ref 2.5–4.6)

## 2018-04-15 MED ORDER — SODIUM CHLORIDE 0.9 % IV SOLN
Freq: Once | INTRAVENOUS | Status: AC
  Administered 2018-04-15: 10:00:00 via INTRAVENOUS
  Filled 2018-04-15: qty 250

## 2018-04-15 NOTE — Progress Notes (Signed)
[place] OFFICE PROGRESS NOTE  Patient Care Team: [name], MD as PCP - General (Unknown Physician Specialty) Bary Castilla, Forest Gleason, MD (General Surgery) Forest Gleason, MD (Inactive) (Oncology) Corey Skains, MD as Consulting Physician (Cardiology) Anell Barr, OD as Consulting Physician (Optometry)  Cancer Staging No matching staging information was found for the patient.   Oncology History   1. Lymphoma low-grade, status Rituxan therapy. 2. Recurrent disease by clinical examination in December of 2012 3. Ovarian mass on PET scan in December of 2012 (normal CA 125). Ovarian mass has resolved after rituximab therapy. 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  Patient had total 5 cycles of chemotherapy with bendamustine and rituximab and 6 cycle was omitted because of significant side effects with and weakness and fatigue.  # OCT 2016- PET significant response; ON surveillance  # 18th OCT 2018- Gazyva;SEP 2018-/OCT 2018 CT scan- progression; s/p 6 cycles [#6 in April 2019]  # JAN 2020-CT scan shows progressive disease; start a acalabrutinib 100mg  BID;# feb 6th2020- decrease dose of acalbrutinib to 100 mg/day [sec to spon bruising] March 1st week- STOPPED Acala sec to spon brusing  # MARCH 17th 2020- Start Venetoclax [out pt ramp up]    # 11/06/2016- colonoscopy [Dr.Byrnett- 2 polyps]  #History of A. Fib-Xarelto; CKD stage II-III.   # #Right posterior neck melanoma stage I [ Dr.Lee/[facility]]..   -------------------------------------------------------------------    # left ? Adnexal mass- ? Etiology; mild SUV uptake incidental [stable compared to previous PET in 2016] Previous workup 2015- vaginal ultrasound negative. MRI February 2018-  approximately 3 cm in size; slow increase in the size of the left adnexal mass over at least 3 years- suspect involvement of lymphoma rather than a primary gynecologic malignancy. --------------------------------------------------------------   DIAGNOSIS: SMALL LYMPHOCYTIC LEUKEMIA  STAGE:  IV    ;GOALS: control/pallaitive  CURRENT/MOST RECENT THERAPY: Venatoclax.      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  female pleasant patient above history of SLL/CLL currently on venetoclax #1 week ramp up is here for a follow up.  Patient tolerated week #1 fairly well.  No clinical or lab evidence of tumor lysis.  Complains of weight gain/swelling in the legs because of IV hydration increase food intake.  Otherwise no shortness of breath.  No bleeding.  Review of Systems  Constitutional: Positive for malaise/fatigue. Negative for chills, diaphoresis, fever and weight loss.  HENT: Negative for nosebleeds and sore throat.   Eyes: Negative for double vision.  Respiratory: Negative for cough, hemoptysis, sputum production, shortness of breath and wheezing.   Cardiovascular: Positive for leg swelling. Negative for chest pain, palpitations and orthopnea.  Gastrointestinal: Negative for abdominal pain, blood in stool, constipation, diarrhea, heartburn, melena and vomiting.  Genitourinary: Negative for dysuria, frequency and urgency.  Musculoskeletal: Negative for back pain and joint pain.  Neurological: Negative for dizziness, tingling, focal weakness, weakness and headaches.  Psychiatric/Behavioral: Negative for depression. The patient is not nervous/anxious and does not have insomnia.       PAST MEDICAL HISTORY :  Past Medical History:  Diagnosis Date  . A-fib ([place])   . Benign neoplasm of rectum and anal canal   . Chronic leukemia of unspecified cell type,  without mention of having achieved remission   .  Hypercholesteremia   . Hypertension   . Hyperthyroidism   . Lymphoma ([place]) 2007  . Non Hodgkin's lymphoma ([place]) 01/22/2006  . Personal history of chemotherapy     PAST SURGICAL HISTORY :   Past Surgical History:  Procedure Laterality Date  . ABDOMINAL HYSTERECTOMY  1958  . APPENDECTOMY    . BREAST BIOPSY Right 1970   right breast biopsy with clip- neg  . BREAST BIOPSY Left 2002   left breast biopsy with clip-neg  . BREAST BIOPSY Right 2014   right breast biopsy with clip-neg  . BREAST CYST ASPIRATION Bilateral 2000   bilateral fine needle aspiration  . BREAST SURGERY Left 1990   biopsy  . BREAST SURGERY Right May 08, 2012   complex fibroadenoma without malignancy.  [name] CARDIAC CATHETERIZATION  2014  . COLONOSCOPY  2009, 2012    hyperplastic rectal polyp 2012 tubular adenoma of proximal ascending colon 2000 9 repeat exam due to 2017.  [name] COLONOSCOPY WITH PROPOFOL N/A 11/06/2016   Procedure: COLONOSCOPY WITH PROPOFOL;  Surgeon: Robert Bellow, MD;  Location: [facility] ENDOSCOPY;  Service: Endoscopy;  Laterality: N/A;  . CORONARY ARTERY BYPASS GRAFT  1999  . FLEXIBLE SIGMOIDOSCOPY  1998    FAMILY HISTORY :   Family History  Problem Relation Age of Onset  . Lung cancer Father        Died age 26  . Heart disease Mother        Died age 57  . Asthma Mother   . CAD Brother        double bypass  . Heart attack Sister   . Tuberculosis Sister        87 years old  . Lung cancer Sister        Died in her 23s  . Angina Sister   . Breast cancer Sister 43  . Rheum arthritis Sister   . Aneurysm Sister        Brain, died age 32  . Angina Sister   . Heart disease Brother        Stent placement  . Heart disease Brother        stent placement  . Cancer Other        breast cancer niece, pancreatic cancer niece  . Breast cancer Other   . Ovarian cancer Neg Hx   . Diabetes Neg Hx     SOCIAL HISTORY:   Social History   Tobacco Use  . Smoking status: Never Smoker  .  Smokeless tobacco: Never Used  Substance Use Topics  . Alcohol use: Yes    Alcohol/week: 2.0 - 3.0 standard drinks    Types: 1 - 2 Glasses of wine, 1 Shots of liquor per week    Comment: daily-   . Drug use: No    ALLERGIES:  is allergic to pravastatin.  MEDICATIONS:  Current Outpatient Medications  Medication Sig Dispense Refill  . allopurinol (ZYLOPRIM) 300 MG tablet TAKE 1 TABLET(300 MG) BY MOUTH TWICE DAILY 180 tablet 3  . ezetimibe (ZETIA) 10 MG tablet Take 1 tablet (10 mg total) daily by mouth. 30 tablet 12  . levothyroxine (SYNTHROID, LEVOTHROID) 88 MCG tablet TAKE 1 TABLET BY MOUTH  DAILY BEFORE BREAKFAST 90 tablet 3  . lisinopril-hydrochlorothiazide (PRINZIDE,ZESTORETIC) 20-12.5 MG tablet TAKE 1 TABLET BY MOUTH  DAILY 90 tablet 3  . Melatonin 1 MG CAPS Take 1 capsule by mouth at bedtime.     [name]  metoprolol succinate (TOPROL-XL) 25 MG 24 hr tablet Take 25 mg by mouth daily.     . ondansetron (ZOFRAN) 4 MG tablet Take 1 tablet (4 mg total) by mouth every 8 (eight) hours as needed for nausea or vomiting. 40 tablet 3  . oxybutynin (DITROPAN) 5 MG tablet Take 1 tablet (5 mg total) by mouth 2 (two) times daily. 60 tablet 11  . venetoclax 10 & 50 & 100 MG TBPK Take 20mg  by mouth daily for week one, 50mg  daily for week 2, 100mg  daily for week 3, 200mg  daily for week 4. Take as directed. 42 each 0  . XARELTO 20 MG TABS tablet Take 20 mg by mouth daily with supper.      No current facility-administered medications for this visit.    Facility-Administered Medications Ordered in Other Visits  Medication Dose Route Frequency Provider Last Rate Last Dose  . heparin lock flush 100 unit/mL  500 Units Intravenous Once Charlaine Dalton R, MD      . sodium chloride flush (NS) 0.9 % injection 10 mL  10 mL Intravenous PRN Cammie Sickle, MD        PHYSICAL EXAMINATION: ECOG PERFORMANCE STATUS: 1 - Symptomatic but completely ambulatory  BP 102/67   Pulse 80   Temp (!) 97.2 F (36.2  C) (Tympanic)   Resp 20   Ht 5\' 5"  (1.651 m)   Wt 156 lb (70.8 kg)   BMI 25.96 kg/m   Filed Weights   04/15/18 0830  Weight: 156 lb (70.8 kg)    Physical Exam  Constitutional: She is oriented to person, place, and time and well-developed, well-nourished, and in no distress.  HENT:  Head: Normocephalic and atraumatic.  Mouth/Throat: Oropharynx is clear and moist. No oropharyngeal exudate.  Eyes: Pupils are equal, round, and reactive to light.  Neck: Normal range of motion. Neck supple.  Cardiovascular: Normal rate and regular rhythm.  Pulmonary/Chest: No respiratory distress. She has no wheezes.  Abdominal: Soft. Bowel sounds are normal. She exhibits no distension and no mass. There is no abdominal tenderness. There is no rebound and no guarding.  Musculoskeletal: Normal range of motion.        General: Edema present. No tenderness.  Lymphadenopathy:  Bilateral up to 1 cm lymph node noted in the axilla bil- improved.  None felt in the bilateral inguinal regions.  Neurological: She is alert and oriented to person, place, and time.  Skin: Skin is warm.  Psychiatric: Affect normal.      LABORATORY DATA:  I have reviewed the data as listed    Component Value Date/Time   NA 139 04/15/2018 0757   NA 139 07/03/2017 1425   NA 141 05/11/2014 0853   K 3.7 04/15/2018 0757   K 3.6 05/11/2014 0853   CL 108 04/15/2018 0757   CL 103 05/11/2014 0853   CO2 25 04/15/2018 0757   CO2 29 05/11/2014 0853   GLUCOSE 117 (H) 04/15/2018 0757   GLUCOSE 107 (H) 05/11/2014 0853   BUN 17 04/15/2018 0757   BUN 19 07/03/2017 1425   BUN 22 (H) 05/11/2014 0853   CREATININE 1.04 (H) 04/15/2018 0757   CREATININE 1.07 (H) 05/11/2014 0853   CALCIUM 9.8 04/15/2018 0757   CALCIUM 10.1 05/11/2014 0853   PROT 6.4 (L) 04/15/2018 0757   PROT 6.4 07/03/2017 1425   PROT 7.3 05/11/2014 0853   ALBUMIN 3.8 04/15/2018 0757   ALBUMIN 4.3 07/03/2017 1425   ALBUMIN 4.7 05/11/2014 0853   AST  23 04/15/2018  0757   AST 29 05/11/2014 0853   ALT 19 04/15/2018 0757   ALT 20 05/11/2014 0853   ALKPHOS 57 04/15/2018 0757   ALKPHOS 47 05/11/2014 0853   BILITOT 0.8 04/15/2018 0757   BILITOT 0.5 07/03/2017 1425   BILITOT 1.0 05/11/2014 0853   GFRNONAA 51 (L) 04/15/2018 0757   GFRNONAA 51 (L) 05/11/2014 0853   GFRAA 59 (L) 04/15/2018 0757   GFRAA 59 (L) 05/11/2014 0853    No results found for: SPEP, UPEP  Lab Results  Component Value Date   WBC 3.6 (L) 04/15/2018   NEUTROABS 2.2 04/15/2018   HGB 12.8 04/15/2018   HCT 38.8 04/15/2018   MCV 90.4 04/15/2018   PLT 72 (L) 04/15/2018      Chemistry      Component Value Date/Time   NA 139 04/15/2018 0757   NA 139 07/03/2017 1425   NA 141 05/11/2014 0853   K 3.7 04/15/2018 0757   K 3.6 05/11/2014 0853   CL 108 04/15/2018 0757   CL 103 05/11/2014 0853   CO2 25 04/15/2018 0757   CO2 29 05/11/2014 0853   BUN 17 04/15/2018 0757   BUN 19 07/03/2017 1425   BUN 22 (H) 05/11/2014 0853   CREATININE 1.04 (H) 04/15/2018 0757   CREATININE 1.07 (H) 05/11/2014 0853   GLU 98 05/25/2013      Component Value Date/Time   CALCIUM 9.8 04/15/2018 0757   CALCIUM 10.1 05/11/2014 0853   ALKPHOS 57 04/15/2018 0757   ALKPHOS 47 05/11/2014 0853   AST 23 04/15/2018 0757   AST 29 05/11/2014 0853   ALT 19 04/15/2018 0757   ALT 20 05/11/2014 0853   BILITOT 0.8 04/15/2018 0757   BILITOT 0.5 07/03/2017 1425   BILITOT 1.0 05/11/2014 0853       RADIOGRAPHIC STUDIES: I have personally reviewed the radiological images as listed and agreed with the findings in the report. No results found.   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  Stable left adnexal mass/lymphoma versus benign ovarian cyst.  Status post Venclexta week #1.  # plan to start  Venetoclax #2 week ramp up today.  Again reviewed the potential side effects. Tolerated well. Will start cycle #2 [400mg /d] on April 14th.  We will get labs in [place] prior to starting.  Discussed with pharmacist.  #Tumor lysis prophylaxis - week #1 no evidence of TLS; -proceed with IV fluids today; allopurinol.  Check labs later in the afternoon.  Patient elected to stay overnight for labs in the morning tomorrow.  # Thrombocytopenia-70s-overall stable.  # A. Fib-stable continue Xarelto [Dr.Kowalski].  Stable  # Re-Educated the patient regarding novel coronavirus-modes of transmission/risks; and measures to avoid infection.   # DISPOSITION: # IVFs/ oral chemo today # at 3 pm today-labs- bmp/ldh/uric acid/mag/phos/ # labs tomorrow- cbc/cmp/ldh/uric acid/mag/phos in AM  # [facility] labs/[place]-on April 10th # follow up in 4 weeks-MD/labs- cbc/cmp/ldh/uric acid/mag/phos   # 40 minutes face-to-face with the patient discussing the above plan of care; more than 50% of time spent on prognosis/ natural history; counseling and coordination.     Orders Placed This Encounter  Procedures  . Basic metabolic panel    Standing Status:   Future    Standing Expiration Date:   04/15/2019  . Lactate dehydrogenase  Standing Status:   Future    Standing Expiration Date:   04/15/2019  . Uric acid    Standing Status:   Future    Standing Expiration Date:   04/15/2019  . Magnesium    Standing Status:   Future    Standing Expiration Date:   04/15/2019  . CBC with Differential    Standing Status:   Future    Standing Expiration Date:   04/15/2019  . Comprehensive metabolic panel    Standing Status:   Future    Standing Expiration Date:   04/15/2019  . Lactate dehydrogenase    Standing Status:   Future    Standing Expiration Date:   04/15/2019  . Magnesium    Standing Status:   Future    Standing Expiration Date:   04/15/2019  . Phosphorus    Standing Status:   Future    Standing Expiration Date:    04/15/2019  . CBC with Differential    Standing Status:   Future    Standing Expiration Date:   04/15/2019  . Comprehensive metabolic panel    Standing Status:   Future    Standing Expiration Date:   04/15/2019  . Lactate dehydrogenase    Standing Status:   Future    Standing Expiration Date:   04/15/2019  . Phosphorus    Standing Status:   Future    Standing Expiration Date:   04/15/2019  . Magnesium    Standing Status:   Future    Standing Expiration Date:   04/15/2019   All questions were answered. The patient knows to call the clinic with any problems, questions or concerns.      Cammie Sickle, MD 04/15/2018 1:12 PM

## 2018-04-15 NOTE — Telephone Encounter (Signed)
Left vm for patient. Patient's labs are stable. No need to come for lab apts tomorrow. Left vm on pt's cell number. Unable to reach patient via home phone.

## 2018-04-15 NOTE — Assessment & Plan Note (Addendum)
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  Stable left adnexal mass/lymphoma versus benign ovarian cyst.  Status post Venclexta week #1.  # plan to start Venetoclax #2 week ramp up today.  Again reviewed the potential side effects. Tolerated well. Will start cycle #2 [400mg /d] on April 14th.  We will get labs in [place] prior to starting.  Discussed with pharmacist.  #Tumor lysis prophylaxis - week #1 no evidence of TLS; -proceed with IV fluids today; allopurinol.  Check labs later in the afternoon.  Patient elected to stay overnight for labs in the morning tomorrow.  # Thrombocytopenia-70s-overall stable.  # A. Fib-stable continue Xarelto [Dr.Kowalski].  Stable  # Re-Educated the patient regarding novel coronavirus-modes of transmission/risks; and measures to avoid infection.   # DISPOSITION: # IVFs/ oral chemo today # at 3 pm today-labs- bmp/ldh/uric acid/mag/phos/ # labs tomorrow- cbc/cmp/ldh/uric acid/mag/phos in AM  # [facility] labs/[place]-on April 10th # follow up in 4 weeks-MD/labs- cbc/cmp/ldh/uric acid/mag/phos   # 40 minutes face-to-face with the patient discussing the above plan of care; more than 50% of time spent on prognosis/ natural history; counseling and coordination.

## 2018-04-15 NOTE — Telephone Encounter (Signed)
Oral Chemotherapy Pharmacist Encounter   Venetoclax Cycle 1, day 8  Reviewed CBC, CMP  Patient has taken her 50mg  dose of venetoclax. She will get fluids today and have PM labs drawn today. Ms. Aull states she did not have any issues during her first week of venetoclax.  Darl Pikes, PharmD, BCPS, [facility] Hematology/Oncology Clinical Pharmacist [facility]/HP/AP Oral [facility] [phone]  04/15/2018 10:22 AM

## 2018-04-15 NOTE — Patient Instructions (Signed)
#   [facility] labs/[facility]-on April 10th- DONOT START UNTIL contact by MD.  # follow up in 4 weeks-MD/labs

## 2018-04-16 ENCOUNTER — Inpatient Hospital Stay: Payer: Medicare Other

## 2018-04-21 ENCOUNTER — Ambulatory Visit: Payer: Medicare Other

## 2018-04-28 ENCOUNTER — Other Ambulatory Visit: Payer: Self-pay | Admitting: Pharmacist

## 2018-04-28 DIAGNOSIS — C8308 Small cell B-cell lymphoma, lymph nodes of multiple sites: Secondary | ICD-10-CM

## 2018-04-28 MED ORDER — VENETOCLAX 100 MG PO TABS: 400.0000 mg | ORAL_TABLET | Freq: Every day | ORAL | 2 refills | Status: DC

## 2018-04-28 NOTE — Progress Notes (Signed)
Oral Chemotherapy Pharmacist Encounter   Called a spoke with Ms. Grandville Silos, set her up for her next venetoclax refill to be taken following her venetoclax starter kit. She is going to have her labs drawn locally on 05/02/18. Dr. Rogue Bussing wants to make sure the labs are received and reviewed before the patient proceeds with dose increase from 230m to 4077m The dose increase is planned to 05/06/18. Patient understands the plan and will wait for the do ahead prior to dose increasing.  AlDarl PikesPharmD, BCPS, [facility]CT[facility]/Oncology Clinical Pharmacist [facility]/AP [phone]/06/2018 9:55 AM

## 2018-04-29 MED FILL — VENCLEXTA 100 MG TABS: 100 | 30 days supply | Qty: 120 | Fill #0

## 2018-05-05 ENCOUNTER — Other Ambulatory Visit: Payer: Self-pay

## 2018-05-06 ENCOUNTER — Inpatient Hospital Stay: Payer: Medicare Other | Attending: Internal Medicine

## 2018-05-06 ENCOUNTER — Telehealth: Payer: Self-pay | Admitting: *Deleted

## 2018-05-06 ENCOUNTER — Inpatient Hospital Stay (HOSPITAL_BASED_OUTPATIENT_CLINIC_OR_DEPARTMENT_OTHER): Payer: Medicare Other | Admitting: Internal Medicine

## 2018-05-06 ENCOUNTER — Other Ambulatory Visit: Payer: Self-pay

## 2018-05-06 DIAGNOSIS — C8308 Small cell B-cell lymphoma, lymph nodes of multiple sites: Secondary | ICD-10-CM | POA: Insufficient documentation

## 2018-05-06 LAB — COMPREHENSIVE METABOLIC PANEL
ALT: 21 U/L (ref 0–44)
AST: 26 U/L (ref 15–41)
Albumin: 3.9 g/dL (ref 3.5–5.0)
Alkaline Phosphatase: 56 U/L (ref 38–126)
Anion gap: 8 (ref 5–15)
BUN: 19 mg/dL (ref 8–23)
CO2: 25 mmol/L (ref 22–32)
Calcium: 9.6 mg/dL (ref 8.9–10.3)
Chloride: 104 mmol/L (ref 98–111)
Creatinine, Ser: 1.02 mg/dL — ABNORMAL HIGH (ref 0.44–1.00)
GFR calc Af Amer: >60 mL/min (ref >60)
GFR calc non Af Amer: 52 mL/min — ABNORMAL LOW (ref >60)
Glucose, Bld: 112 mg/dL — ABNORMAL HIGH (ref 70–99)
Potassium: 3.4 mmol/L — ABNORMAL LOW (ref 3.5–5.1)
Sodium: 137 mmol/L (ref 135–145)
Total Bilirubin: 0.9 mg/dL (ref 0.3–1.2)
Total Protein: 6.4 g/dL — ABNORMAL LOW (ref 6.5–8.1)

## 2018-05-06 LAB — CBC WITH DIFFERENTIAL/PLATELET
Abs Immature Granulocytes: 0.01 10*3/uL (ref 0.00–0.07)
Basophils Absolute: 0 10*3/uL (ref 0.0–0.1)
Basophils Relative: 1 %
Eosinophils Absolute: 0 10*3/uL (ref 0.0–0.5)
Eosinophils Relative: 0 %
HCT: 37.5 % (ref 36.0–46.0)
Hemoglobin: 12.6 g/dL (ref 12.0–15.0)
Immature Granulocytes: 0 %
Lymphocytes Relative: 26 %
Lymphs Abs: 0.7 10*3/uL (ref 0.7–4.0)
MCH: 29.6 pg (ref 26.0–34.0)
MCHC: 33.6 g/dL (ref 30.0–36.0)
MCV: 88 fL (ref 80.0–100.0)
Monocytes Absolute: 0.4 10*3/uL (ref 0.1–1.0)
Monocytes Relative: 13 %
Neutro Abs: 1.7 10*3/uL (ref 1.7–7.7)
Neutrophils Relative %: 60 %
Platelets: 82 10*3/uL — ABNORMAL LOW (ref 150–400)
RBC: 4.26 MIL/uL (ref 3.87–5.11)
RDW: 14.1 % (ref 11.5–15.5)
WBC: 2.8 10*3/uL — ABNORMAL LOW (ref 4.0–10.5)
nRBC: 0 % (ref 0.0–0.2)

## 2018-05-06 LAB — MAGNESIUM: Magnesium: 1.6 mg/dL — ABNORMAL LOW (ref 1.7–2.4)

## 2018-05-06 LAB — PHOSPHORUS: Phosphorus: 2.9 mg/dL (ref 2.5–4.6)

## 2018-05-06 LAB — LACTATE DEHYDROGENASE: LDH: 136 U/L (ref 98–192)

## 2018-05-06 NOTE — Telephone Encounter (Signed)
Unable to reach patient for telehealth visit. Left vm on pt's cell phone requesting a call back.

## 2018-05-06 NOTE — Assessment & Plan Note (Addendum)
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  Stable left adnexal mass/lymphoma versus benign ovarian cyst.  Status post Venclexta.   #Given the borderline low counts-ANC 1.7/platelets 80-I would recommend continued Venclexta at 200 mg once a day [rather than ramping up to 400 mg a day].  Patient understands.  #Tumor lysis prophylaxis -no evidence of TLS.   # Thrombocytopenia-80- stable.   # A. Fib-stable continue Xarelto [Dr.Kowalski].  Stable.   # DISPOSITION: # Follow up MD- cbc/cmp/mag/phos/LDH-Dr.B

## 2018-05-06 NOTE — Progress Notes (Signed)
I connected with Dennie Maizes on 05/06/18 at  2:00 PM EDT by telephone visit and verified that I am speaking with the correct person using two identifiers.  I discussed the limitations, risks, security and privacy concerns of performing an evaluation and management service by telemedicine and the availability of in-person appointments. I also discussed with the patient that there may be a patient responsible charge related to this service. The patient expressed understanding and agreed to proceed.    Other persons participating in the visit and their role in the encounter: none  Patient's location: Home Provider's location: Office  Oncology History   1. Lymphoma low-grade, status Rituxan therapy. 2. Recurrent disease by clinical examination in December of 2012 3. Ovarian mass on PET scan in December of 2012 (normal CA 125). Ovarian mass has resolved after rituximab therapy. 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  Patient had total 5 cycles of chemotherapy with bendamustine and rituximab and 6 cycle was omitted because of significant side effects with and weakness and fatigue.  # OCT 2016- PET significant response; ON surveillance  # 18th OCT 2018- Gazyva;SEP 2018-/OCT 2018 CT scan- progression; s/p 6 cycles [#6 in April 2019]  # JAN 2020-CT scan shows progressive disease; start a acalabrutinib 100mg  BID;# feb 6th2020- decrease dose of acalbrutinib to 100 mg/day [sec to spon bruising] March 1st week- STOPPED Acala sec to spon brusing  # MARCH 17th 2020- Start Venetoclax [out pt ramp up]    # 11/06/2016- colonoscopy [Dr.Byrnett- 2 polyps]  #History of A. Fib-Xarelto; CKD stage II-III.   # #Right posterior neck melanoma stage I [ Dr.Lee/[place]]..    -------------------------------------------------------------------    # left ? Adnexal mass- ? Etiology; mild SUV uptake incidental [stable compared to previous PET in 2016] Previous workup 2015- vaginal ultrasound negative. MRI February 2018- approximately 3 cm in size; slow increase in the size of the left adnexal mass over at least 3 years- suspect involvement of lymphoma rather than a primary gynecologic malignancy. --------------------------------------------------------------   DIAGNOSIS: SMALL LYMPHOCYTIC LEUKEMIA  STAGE:  IV    ;GOALS: control/pallaitive  CURRENT/MOST RECENT THERAPY: Venatoclax.      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  female with history of CLL currently on Venclexta.  Patient finished a month of Venclexta.  She is currently on 200 mg a day.  She is supposed to start 400 mg once a day starting tomorrow.  Denies any bleeding/spontaneous bruising.  Observation/objective: White count 3.7 ANC 1.7 platelets 82; hemoglobin 12.6.  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  Stable left adnexal mass/lymphoma versus benign ovarian cyst.  Status post Venclexta.   #Given the borderline low counts-ANC 1.7/platelets 80-I would recommend continued Venclexta at 200 mg once a day [rather than ramping up to 400 mg a day].  Patient understands.  #Tumor lysis prophylaxis -no evidence of TLS.   # Thrombocytopenia-80- stable.   # A. Fib-stable continue Xarelto [Dr.Kowalski].  Stable.   # DISPOSITION: # Follow up MD- cbc/cmp/mag/phos/LDH-Dr.B      Follow-up instructions:  I  discussed the assessment and treatment plan with the patient.  The patient  was provided an opportunity to ask questions and all were answered.  The patient agreed with the plan and demonstrated understanding of instructions.  The patient was advised to call back or seek an in person evaluation if the symptoms worsen or if the condition fails to improve as anticipated.  I provided 11 minutes of non face-to-face telephone visit time during this encounter, and > 50% was spent counseling as documented under my assessment & plan.   Dr. Charlaine Dalton [place] at [facility] 05/06/2018 4:02 PM

## 2018-05-06 NOTE — Telephone Encounter (Signed)
2nd attempt to reach patient for her virtual visit for Dr. Rogue Bussing. Left vm requesting call back

## 2018-05-13 ENCOUNTER — Ambulatory Visit: Payer: Medicare Other | Admitting: Internal Medicine

## 2018-05-13 ENCOUNTER — Other Ambulatory Visit: Payer: Medicare Other

## 2018-05-20 ENCOUNTER — Ambulatory Visit: Payer: Self-pay | Admitting: Family Medicine

## 2018-05-26 ENCOUNTER — Encounter: Payer: Self-pay | Admitting: Family Medicine

## 2018-05-26 ENCOUNTER — Other Ambulatory Visit: Payer: Self-pay

## 2018-05-26 ENCOUNTER — Ambulatory Visit (INDEPENDENT_AMBULATORY_CARE_PROVIDER_SITE_OTHER): Payer: Medicare Other | Admitting: Family Medicine

## 2018-05-26 VITALS — BP 118/72 | HR 76 | Temp 98.2°F | Resp 18 | Wt 148.6 lb

## 2018-05-26 DIAGNOSIS — C8308 Small cell B-cell lymphoma, lymph nodes of multiple sites: Secondary | ICD-10-CM | POA: Diagnosis not present

## 2018-05-26 DIAGNOSIS — Z23 Encounter for immunization: Secondary | ICD-10-CM

## 2018-05-26 DIAGNOSIS — I10 Essential (primary) hypertension: Secondary | ICD-10-CM | POA: Diagnosis not present

## 2018-05-26 DIAGNOSIS — S61411A Laceration without foreign body of right hand, initial encounter: Secondary | ICD-10-CM

## 2018-05-26 DIAGNOSIS — I25810 Atherosclerosis of coronary artery bypass graft(s) without angina pectoris: Secondary | ICD-10-CM

## 2018-05-26 NOTE — Progress Notes (Signed)
Patient: Jacqueline Webb Female    DOB: [birth year]   78 y.o.   MRN: [id number] Visit Date: 05/26/2018  Today's Provider: [name], MD   No chief complaint on file.  Subjective:     HPI   Patient states that she is here for a follow up. She also has complaints of swelling in left leg for the last 6-8 months.  She had vein stripping from CABG years ago in this leg. She is still playing golf 4-5 times per week.  Allergies  Allergen Reactions  . Pravastatin Other (See Comments)     Current Outpatient Medications:  .  allopurinol (ZYLOPRIM) 300 MG tablet, TAKE 1 TABLET(300 MG) BY MOUTH TWICE DAILY, Disp: 180 tablet, Rfl: 3 .  ezetimibe (ZETIA) 10 MG tablet, Take 1 tablet (10 mg total) daily by mouth., Disp: 30 tablet, Rfl: 12 .  levothyroxine (SYNTHROID, LEVOTHROID) 88 MCG tablet, TAKE 1 TABLET BY MOUTH  DAILY BEFORE BREAKFAST, Disp: 90 tablet, Rfl: 3 .  lisinopril-hydrochlorothiazide (PRINZIDE,ZESTORETIC) 20-12.5 MG tablet, TAKE 1 TABLET BY MOUTH  DAILY, Disp: 90 tablet, Rfl: 3 .  Melatonin 1 MG CAPS, Take 1 capsule by mouth at bedtime. , Disp: , Rfl:  .  metoprolol succinate (TOPROL-XL) 25 MG 24 hr tablet, Take 25 mg by mouth daily. , Disp: , Rfl:  .  ondansetron (ZOFRAN) 4 MG tablet, Take 1 tablet (4 mg total) by mouth every 8 (eight) hours as needed for nausea or vomiting., Disp: 40 tablet, Rfl: 3 .  oxybutynin (DITROPAN) 5 MG tablet, Take 1 tablet (5 mg total) by mouth 2 (two) times daily., Disp: 60 tablet, Rfl: 11 .  XARELTO 20 MG TABS tablet, Take 20 mg by mouth daily with supper. , Disp: , Rfl:  .  venetoclax 10 & 50 & 100 MG TBPK, Take 20mg  by mouth daily for week one, 50mg  daily for week 2, 100mg  daily for week 3, 200mg  daily for week 4. Take as directed. (Patient not taking: Reported on 05/26/2018), Disp: 42 each, Rfl: 0 .  venetoclax 100 MG TABS, Take 400 mg by mouth daily. (Patient not taking: Reported on 05/26/2018), Disp: 120 tablet, Rfl: 2 No current  facility-administered medications for this visit.   Facility-Administered Medications Ordered in Other Visits:  .  heparin lock flush 100 unit/mL, 500 Units, Intravenous, Once, Brahmanday, Govinda R, MD .  sodium chloride flush (NS) 0.9 % injection 10 mL, 10 mL, Intravenous, PRN, Cammie Sickle, MD  Review of Systems  Eyes: Negative.   Respiratory: Negative.   Cardiovascular: Positive for leg swelling.  Endocrine: Negative.   Genitourinary: Negative.   Musculoskeletal: Negative.   Allergic/Immunologic: Negative.   Psychiatric/Behavioral: Negative.   All other systems reviewed and are negative.   Social History   Tobacco Use  . Smoking status: Never Smoker  . Smokeless tobacco: Never Used  Substance Use Topics  . Alcohol use: Yes    Alcohol/week: 2.0 - 3.0 standard drinks    Types: 1 - 2 Glasses of wine, 1 Shots of liquor per week    Comment: daily-       Objective:   BP 118/72 (BP Location: Right Arm, Patient Position: Sitting, Cuff Size: Normal)   Pulse 76   Temp 98.2 F (36.8 C) (Oral)   Resp 18   Wt 148 lb 9.6 oz (67.4 kg)   SpO2 98%   BMI 24.73 kg/m  Vitals:   05/26/18 1138  BP: 118/72  Pulse:  76  Resp: 18  Temp: 98.2 F (36.8 C)  TempSrc: Oral  SpO2: 98%  Weight: 148 lb 9.6 oz (67.4 kg)     Physical Exam Vitals signs reviewed.  Constitutional:      Appearance: She is well-developed.  HENT:     Head: Normocephalic and atraumatic.     Right Ear: External ear normal.     Left Ear: External ear normal.     Nose: Nose normal.  Eyes:     General: No scleral icterus.    Conjunctiva/sclera: Conjunctivae normal.  Neck:     Thyroid: No thyromegaly.  Cardiovascular:     Rate and Rhythm: Normal rate and regular rhythm.     Heart sounds: Normal heart sounds.  Pulmonary:     Effort: Pulmonary effort is normal.     Breath sounds: Normal breath sounds.     Comments: She has small mobile nontender lymph nodes in both axilla. Abdominal:      Palpations: Abdomen is soft.  Musculoskeletal:     Right lower leg: Edema present.     Comments: Trace right LE edema.  Skin:    General: Skin is warm and dry.     Comments: Rash over right forehead,vesicular--c/w shingle.  Neurological:     General: No focal deficit present.     Mental Status: She is alert and oriented to person, place, and time.  Psychiatric:        Mood and Affect: Mood normal.        Behavior: Behavior normal.        Thought Content: Thought content normal.        Judgment: Judgment normal.         Assessment & Plan    1. Laceration of right hand, foreign body presence unspecified, initial encounter healing - Tdap vaccine greater than or equal to 7yo IM  2. Coronary artery disease involving coronary bypass graft of native heart without angina pectoris All risk factors treated.  3. Essential (primary) hypertension  4. 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     Emilia Kayes Cranford Mon, MD  [facility]

## 2018-06-02 ENCOUNTER — Other Ambulatory Visit: Payer: Self-pay

## 2018-06-03 ENCOUNTER — Inpatient Hospital Stay (HOSPITAL_BASED_OUTPATIENT_CLINIC_OR_DEPARTMENT_OTHER): Payer: Medicare Other | Admitting: Internal Medicine

## 2018-06-03 ENCOUNTER — Other Ambulatory Visit: Payer: Self-pay

## 2018-06-03 ENCOUNTER — Ambulatory Visit: Payer: Medicare Other | Admitting: Internal Medicine

## 2018-06-03 ENCOUNTER — Encounter: Payer: Self-pay | Admitting: Internal Medicine

## 2018-06-03 ENCOUNTER — Other Ambulatory Visit: Payer: Self-pay | Admitting: Pharmacist

## 2018-06-03 ENCOUNTER — Inpatient Hospital Stay: Payer: Medicare Other | Attending: Internal Medicine

## 2018-06-03 DIAGNOSIS — C8308 Small cell B-cell lymphoma, lymph nodes of multiple sites: Secondary | ICD-10-CM | POA: Diagnosis not present

## 2018-06-03 LAB — COMPREHENSIVE METABOLIC PANEL
ALT: 27 U/L (ref 0–44)
AST: 31 U/L (ref 15–41)
Albumin: 4 g/dL (ref 3.5–5.0)
Alkaline Phosphatase: 61 U/L (ref 38–126)
Anion gap: 8 (ref 5–15)
BUN: 18 mg/dL (ref 8–23)
CO2: 27 mmol/L (ref 22–32)
Calcium: 9.7 mg/dL (ref 8.9–10.3)
Chloride: 104 mmol/L (ref 98–111)
Creatinine, Ser: 0.95 mg/dL (ref 0.44–1.00)
GFR calc Af Amer: >60 mL/min (ref >60)
GFR calc non Af Amer: 57 mL/min — ABNORMAL LOW (ref >60)
Glucose, Bld: 110 mg/dL — ABNORMAL HIGH (ref 70–99)
Potassium: 3.4 mmol/L — ABNORMAL LOW (ref 3.5–5.1)
Sodium: 139 mmol/L (ref 135–145)
Total Bilirubin: 0.9 mg/dL (ref 0.3–1.2)
Total Protein: 6.6 g/dL (ref 6.5–8.1)

## 2018-06-03 LAB — CBC WITH DIFFERENTIAL/PLATELET
Abs Immature Granulocytes: 0.01 10*3/uL (ref 0.00–0.07)
Basophils Absolute: 0 10*3/uL (ref 0.0–0.1)
Basophils Relative: 0 %
Eosinophils Absolute: 0 10*3/uL (ref 0.0–0.5)
Eosinophils Relative: 1 %
HCT: 40 % (ref 36.0–46.0)
Hemoglobin: 13.2 g/dL (ref 12.0–15.0)
Immature Granulocytes: 0 %
Lymphocytes Relative: 23 %
Lymphs Abs: 0.7 10*3/uL (ref 0.7–4.0)
MCH: 29.6 pg (ref 26.0–34.0)
MCHC: 33 g/dL (ref 30.0–36.0)
MCV: 89.7 fL (ref 80.0–100.0)
Monocytes Absolute: 0.3 10*3/uL (ref 0.1–1.0)
Monocytes Relative: 10 %
Neutro Abs: 1.8 10*3/uL (ref 1.7–7.7)
Neutrophils Relative %: 66 %
Platelets: 66 10*3/uL — ABNORMAL LOW (ref 150–400)
RBC: 4.46 MIL/uL (ref 3.87–5.11)
RDW: 14.3 % (ref 11.5–15.5)
WBC: 2.8 10*3/uL — ABNORMAL LOW (ref 4.0–10.5)
nRBC: 0 % (ref 0.0–0.2)

## 2018-06-03 LAB — PHOSPHORUS: Phosphorus: 3.2 mg/dL (ref 2.5–4.6)

## 2018-06-03 LAB — LACTATE DEHYDROGENASE: LDH: 136 U/L (ref 98–192)

## 2018-06-03 LAB — MAGNESIUM: Magnesium: 1.8 mg/dL (ref 1.7–2.4)

## 2018-06-03 MED ORDER — VENETOCLAX 100 MG PO TABS: 200.0000 mg | ORAL_TABLET | Freq: Every day | ORAL | 2 refills | Status: DC

## 2018-06-03 NOTE — Progress Notes (Signed)
I connected with Jacqueline Webb on 06/03/18 at 11:30 AM EDT by telephone visit and verified that I am speaking with the correct person using two identifiers.  I discussed the limitations, risks, security and privacy concerns of performing an evaluation and management service by telemedicine and the availability of in-person appointments. I also discussed with the patient that there may be a patient responsible charge related to this service. The patient expressed understanding and agreed to proceed.    Other persons participating in the visit and their role in the encounter: none  Patient's location: home  Provider's location: office   Oncology History   1. Lymphoma low-grade, status Rituxan therapy. 2. Recurrent disease by clinical examination in December of 2012 3. Ovarian mass on PET scan in December of 2012 (normal CA 125). Ovarian mass has resolved after rituximab therapy. 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  Patient had total 5 cycles of chemotherapy with bendamustine and rituximab and 6 cycle was omitted because of significant side effects with and weakness and fatigue.  # OCT 2016- PET significant response; ON surveillance  # 18th OCT 2018- [place];SEP 2018-/OCT 2018 CT scan- progression; s/p 6 cycles [#6 in April 2019]  # JAN 2020-CT scan shows progressive disease; start a acalabrutinib 100mg  BID;# feb 6th2020- decrease dose of acalbrutinib to 100 mg/day [sec to spon bruising] March 1st week- STOPPED Acala sec to spon brusing  # MARCH 17th 2020- Start Venetoclax [out pt ramp up]    # 11/06/2016- colonoscopy [Dr.Byrnett- 2 polyps]  #History of A. Fib-Xarelto; CKD stage II-III.   # #Right posterior neck melanoma stage I [ Dr.Lee/Elkins]..    -------------------------------------------------------------------    # left ? Adnexal mass- ? Etiology; mild SUV uptake incidental [stable compared to previous PET in 2016] Previous workup 2015- vaginal ultrasound negative. MRI February 2018- approximately 3 cm in size; slow increase in the size of the left adnexal mass over at least 3 years- suspect involvement of lymphoma rather than a primary gynecologic malignancy. --------------------------------------------------------------   DIAGNOSIS: SMALL LYMPHOCYTIC LEUKEMIA  STAGE:  IV    ;GOALS: control/pallaitive  CURRENT/MOST RECENT THERAPY: Venatoclax.      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   History of present illness:Jacqueline Webb 78 y.o.  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  Patient is currently on 200 milligrams a day.  Patient's dose was not ramped up to 400 at last visit approximately a month ago-because of the low platelet count of 80s.   She denies any worsening lymph nodes in the underarm.  She feels her lymph nodes are getting better. 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  Stable left adnexal mass/lymphoma versus benign ovarian cyst.  Currently on venetoclax-March 2020.  #I would recommend continued venetoclax at 400 mg a day-given white count of 2.8 ANC 1.6/platelets 66.  Will check with Ebony Hail regarding new prescription.  #Tumor lysis prophylaxis -no evidence of TLS.[name]  Flaccid next visit.  # Thrombocytopenia-66 likely secondary to venetoclax.  No clinical bruising.  # A. Fib-stable continue Xarelto [Dr.Kowalski].  Stable.  # DISPOSITION: #  Follow up MD in week of June 15th -  cbc/cmp/mag/phos/LDH-Dr.B      Follow-up instructions:  I discussed the assessment and treatment plan with the patient.  The patient was provided an opportunity to ask questions and all were answered.  The patient agreed with the plan and demonstrated understanding of instructions.  The patient was advised to call back or seek an in person evaluation if the symptoms worsen or if the condition fails to improve as anticipated.  I provided 12  minutes of non face-to-face telephone visit time during this encounter, and > 50% was spent counseling as documented under my assessment & plan.   Dr. Charlaine Dalton [place] [facility] 06/03/2018 12:10 PM

## 2018-06-03 NOTE — Assessment & Plan Note (Addendum)
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  Stable left adnexal mass/lymphoma versus benign ovarian cyst.  Currently on venetoclax-March 2020.  #I would recommend continued venetoclax at 400 mg a day-given white count of 2.8 ANC 1.6/platelets 66.  Will check with Ebony Hail regarding new prescription.  #Tumor lysis prophylaxis -no evidence of TLS.[name]  Flaccid next visit.  # Thrombocytopenia-66 likely secondary to venetoclax.  No clinical bruising.  # A. Fib-stable continue Xarelto [Dr.Kowalski].  Stable.  # DISPOSITION: # Follow up MD in week of June 15th - cbc/cmp/mag/phos/LDH-Dr.B

## 2018-06-04 ENCOUNTER — Telehealth: Payer: Self-pay | Admitting: Pharmacist

## 2018-06-04 NOTE — Telephone Encounter (Signed)
Oral Chemotherapy Pharmacist Encounter   Dr. Rogue Bussing plans to keep Ms. Font on venetoclax 200mg  daily. New prescription with this dose was sent to [facility]. Spoke with Ms. Stanbery and set her up with a refill from with the pharmacy. Reviewed the current dosing for her venetoclax, she stated her understanding.   Overall Ms. Sano said she was feeling good, She occasionally feels tired or nauseous but she is able to manage that.  Darl Pikes, PharmD, BCPS, [facility] Hematology/Oncology Clinical Pharmacist [facility]/AP [facility] [phone]  06/04/2018 10:19 AM

## 2018-06-05 MED FILL — VENCLEXTA 100 MG TABS: 100 | 30 days supply | Qty: 60 | Fill #0

## 2018-06-09 ENCOUNTER — Ambulatory Visit: Payer: Medicare Other | Admitting: Internal Medicine

## 2018-06-29 ENCOUNTER — Other Ambulatory Visit: Payer: Self-pay | Admitting: Family Medicine

## 2018-07-08 ENCOUNTER — Other Ambulatory Visit: Payer: Self-pay | Admitting: *Deleted

## 2018-07-08 ENCOUNTER — Inpatient Hospital Stay: Payer: Medicare Other | Attending: Internal Medicine

## 2018-07-08 ENCOUNTER — Inpatient Hospital Stay (HOSPITAL_BASED_OUTPATIENT_CLINIC_OR_DEPARTMENT_OTHER): Payer: Medicare Other | Admitting: Internal Medicine

## 2018-07-08 ENCOUNTER — Other Ambulatory Visit: Payer: Self-pay

## 2018-07-08 ENCOUNTER — Other Ambulatory Visit: Payer: Self-pay | Admitting: Pharmacist

## 2018-07-08 VITALS — BP 120/78 | HR 83 | Temp 97.7°F | Resp 18 | Wt 144.9 lb

## 2018-07-08 DIAGNOSIS — R1900 Intra-abdominal and pelvic swelling, mass and lump, unspecified site: Secondary | ICD-10-CM

## 2018-07-08 DIAGNOSIS — C8308 Small cell B-cell lymphoma, lymph nodes of multiple sites: Secondary | ICD-10-CM

## 2018-07-08 DIAGNOSIS — Z79899 Other long term (current) drug therapy: Secondary | ICD-10-CM | POA: Diagnosis not present

## 2018-07-08 DIAGNOSIS — I25810 Atherosclerosis of coronary artery bypass graft(s) without angina pectoris: Secondary | ICD-10-CM | POA: Diagnosis not present

## 2018-07-08 DIAGNOSIS — I4891 Unspecified atrial fibrillation: Secondary | ICD-10-CM | POA: Insufficient documentation

## 2018-07-08 DIAGNOSIS — M7989 Other specified soft tissue disorders: Secondary | ICD-10-CM | POA: Insufficient documentation

## 2018-07-08 DIAGNOSIS — D696 Thrombocytopenia, unspecified: Secondary | ICD-10-CM

## 2018-07-08 DIAGNOSIS — Z7901 Long term (current) use of anticoagulants: Secondary | ICD-10-CM

## 2018-07-08 DIAGNOSIS — E782 Mixed hyperlipidemia: Secondary | ICD-10-CM | POA: Diagnosis not present

## 2018-07-08 DIAGNOSIS — I10 Essential (primary) hypertension: Secondary | ICD-10-CM | POA: Diagnosis not present

## 2018-07-08 DIAGNOSIS — I480 Paroxysmal atrial fibrillation: Secondary | ICD-10-CM | POA: Diagnosis not present

## 2018-07-08 DIAGNOSIS — R300 Dysuria: Secondary | ICD-10-CM

## 2018-07-08 DIAGNOSIS — I482 Chronic atrial fibrillation, unspecified: Secondary | ICD-10-CM | POA: Diagnosis not present

## 2018-07-08 LAB — COMPREHENSIVE METABOLIC PANEL
ALT: 27 U/L (ref 0–44)
AST: 28 U/L (ref 15–41)
Albumin: 4 g/dL (ref 3.5–5.0)
Alkaline Phosphatase: 62 U/L (ref 38–126)
Anion gap: 11 (ref 5–15)
BUN: 19 mg/dL (ref 8–23)
CO2: 24 mmol/L (ref 22–32)
Calcium: 9.8 mg/dL (ref 8.9–10.3)
Chloride: 105 mmol/L (ref 98–111)
Creatinine, Ser: 0.87 mg/dL (ref 0.44–1.00)
GFR calc Af Amer: >60 mL/min (ref >60)
GFR calc non Af Amer: >60 mL/min (ref >60)
Glucose, Bld: 133 mg/dL — ABNORMAL HIGH (ref 70–99)
Potassium: 3.3 mmol/L — ABNORMAL LOW (ref 3.5–5.1)
Sodium: 140 mmol/L (ref 135–145)
Total Bilirubin: 0.9 mg/dL (ref 0.3–1.2)
Total Protein: 6.7 g/dL (ref 6.5–8.1)

## 2018-07-08 LAB — CBC WITH DIFFERENTIAL/PLATELET
Abs Immature Granulocytes: 0.01 10*3/uL (ref 0.00–0.07)
Basophils Absolute: 0 10*3/uL (ref 0.0–0.1)
Basophils Relative: 1 %
Eosinophils Absolute: 0 10*3/uL (ref 0.0–0.5)
Eosinophils Relative: 0 %
HCT: 41.8 % (ref 36.0–46.0)
Hemoglobin: 13.7 g/dL (ref 12.0–15.0)
Immature Granulocytes: 0 %
Lymphocytes Relative: 22 %
Lymphs Abs: 0.8 10*3/uL (ref 0.7–4.0)
MCH: 28.8 pg (ref 26.0–34.0)
MCHC: 32.8 g/dL (ref 30.0–36.0)
MCV: 87.8 fL (ref 80.0–100.0)
Monocytes Absolute: 0.4 10*3/uL (ref 0.1–1.0)
Monocytes Relative: 11 %
Neutro Abs: 2.3 10*3/uL (ref 1.7–7.7)
Neutrophils Relative %: 66 %
Platelets: 82 10*3/uL — ABNORMAL LOW (ref 150–400)
RBC: 4.76 MIL/uL (ref 3.87–5.11)
RDW: 15.3 % (ref 11.5–15.5)
WBC: 3.5 10*3/uL — ABNORMAL LOW (ref 4.0–10.5)
nRBC: 0 % (ref 0.0–0.2)

## 2018-07-08 LAB — URINALYSIS, COMPLETE (UACMP) WITH MICROSCOPIC
Bilirubin Urine: NEGATIVE
Glucose, UA: NEGATIVE mg/dL
Ketones, ur: NEGATIVE mg/dL
Nitrite: NEGATIVE
Protein, ur: NEGATIVE mg/dL
Specific Gravity, Urine: 1.005 (ref 1.005–1.030)
Squamous Epithelial / HPF: NONE SEEN (ref 0–5)
pH: 5 (ref 5.0–8.0)

## 2018-07-08 LAB — LACTATE DEHYDROGENASE: LDH: 151 U/L (ref 98–192)

## 2018-07-08 MED ORDER — VENETOCLAX 100 MG PO TABS: 300.0000 mg | ORAL_TABLET | Freq: Every day | ORAL | 2 refills | Status: DC

## 2018-07-08 NOTE — Assessment & Plan Note (Addendum)
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  Stable left adnexal mass/lymphoma versus benign ovarian cyst.  Currently on venetoclax-March 2020.  #Currently on Venclexta 200 mg a day; today white count is 3.5 ANC 2.5 platelets 88; hemoglobin 12.  Would recommend going up on the venetoclax to 300 mg once a day.  # Dysuria- ? UA/ Culture- ? oxybutnin 5 mg/day; if needed.   # Thrombocytopenia-88-  likely secondary to venetoclax/CLL.  No spontaneous bruising.  # A. Fib-stable continue Xarelto [Dr.Kowalski].  Stable  # DISPOSITION: # Follow up second week of July 2020; MD; labs- cbc/cmp/ldh- Dr.B  Cc; [employer]

## 2018-07-08 NOTE — Progress Notes (Signed)
[facility] OFFICE PROGRESS NOTE  Patient Care Team: [name], MD as PCP - General (Unknown Physician Specialty) Bary Castilla, Forest Gleason, MD (General Surgery) Forest Gleason, MD (Inactive) (Oncology) Corey Skains, MD as Consulting Physician (Cardiology) Anell Barr, OD as Consulting Physician (Optometry)  Cancer Staging No matching staging information was found for the patient.   Oncology History Overview Note  1. Lymphoma low-grade, status Rituxan therapy. 2. Recurrent disease by clinical examination in December of 2012 3. Ovarian mass on PET scan in December of 2012 (normal CA 125). Ovarian mass has resolved after rituximab therapy. 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  Patient had total 5 cycles of chemotherapy with bendamustine and rituximab and 6 cycle was omitted because of significant side effects with and weakness and fatigue.  # OCT 2016- PET significant response; ON surveillance  # 18th OCT 2018- [place];SEP 2018-/OCT 2018 CT scan- progression; s/p 6 cycles [#6 in April 2019]  # JAN 2020-CT scan shows progressive disease; start a acalabrutinib 100mg  BID;# feb 6th2020- decrease dose of acalbrutinib to 100 mg/day [sec to spon bruising] March 1st week- STOPPED [place] sec to spon brusing  # MARCH 17th 2020- Start Venetoclax [out pt ramp up]    # 11/06/2016- colonoscopy [Dr.Byrnett- 2 polyps]  #History of A. Fib-Xarelto; CKD stage II-III.   # #Right posterior neck melanoma stage I [ Dr.Lee/[facility]]..   -------------------------------------------------------------------    # left ? Adnexal mass- ? Etiology; mild SUV uptake incidental [stable compared to previous PET in 2016] Previous workup 2015- vaginal ultrasound negative. MRI  February 2018- approximately 3 cm in size; slow increase in the size of the left adnexal mass over at least 3 years- suspect involvement of lymphoma rather than a primary gynecologic malignancy. --------------------------------------------------------------   DIAGNOSIS: SMALL LYMPHOCYTIC LEUKEMIA  STAGE:  IV    ;GOALS: control/pallaitive  CURRENT/MOST RECENT THERAPY: Venatoclax.    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  Patient is currently on venetoclax 200 milligrams once a day.  Tolerating well.  Notes to have slight improvement of the bilateral axillary lymph nodes.  Appetite is good.  Mild swelling in the legs.  Otherwise no nausea no vomiting.  No night sweats.  Review of Systems  Constitutional: Positive for malaise/fatigue. Negative for chills, diaphoresis, fever and weight loss.  HENT: Negative for nosebleeds and sore throat.   Eyes: Negative for double vision.  Respiratory: Negative for cough, hemoptysis, sputum production, shortness of breath and wheezing.   Cardiovascular: Positive for leg swelling. Negative for chest pain, palpitations and orthopnea.  Gastrointestinal: Negative for abdominal pain, blood in stool, constipation, diarrhea, heartburn, melena and vomiting.  Genitourinary: Negative for dysuria, frequency and urgency.  Musculoskeletal: Negative for back pain and joint pain.  Neurological: Negative for dizziness, tingling, focal weakness, weakness and headaches.  Psychiatric/Behavioral: Negative for depression. The patient is not nervous/anxious and does not have insomnia.       PAST MEDICAL HISTORY :  Past Medical History:  Diagnosis Date  . A-fib ([place])   . Benign neoplasm of rectum and anal canal   . Chronic leukemia of unspecified cell type, without  mention of having achieved  remission   . Hypercholesteremia   . Hypertension   . Hyperthyroidism   . Lymphoma ([place]) 2007  . Non Hodgkin's lymphoma ([place]) 01/22/2006  . Personal history of chemotherapy     PAST SURGICAL HISTORY :   Past Surgical History:  Procedure Laterality Date  . ABDOMINAL HYSTERECTOMY  1958  . APPENDECTOMY    . BREAST BIOPSY Right 1970   right breast biopsy with clip- neg  . BREAST BIOPSY Left 2002   left breast biopsy with clip-neg  . BREAST BIOPSY Right 2014   right breast biopsy with clip-neg  . BREAST CYST ASPIRATION Bilateral 2000   bilateral fine needle aspiration  . BREAST SURGERY Left 1990   biopsy  . BREAST SURGERY Right May 08, 2012   complex fibroadenoma without malignancy.  [name] CARDIAC CATHETERIZATION  2014  . COLONOSCOPY  2009, 2012    hyperplastic rectal polyp 2012 tubular adenoma of proximal ascending colon 2000 9 repeat exam due to 2017.  [name] COLONOSCOPY WITH PROPOFOL N/A 11/06/2016   Procedure: COLONOSCOPY WITH PROPOFOL;  Surgeon: Robert Bellow, MD;  Location: [facility] ENDOSCOPY;  Service: Endoscopy;  Laterality: N/A;  . CORONARY ARTERY BYPASS GRAFT  1999  . FLEXIBLE SIGMOIDOSCOPY  1998    FAMILY HISTORY :   Family History  Problem Relation Age of Onset  . Lung cancer Father        Died age 40  . Heart disease Mother        Died age 55  . Asthma Mother   . CAD Brother        double bypass  . Heart attack Sister   . Tuberculosis Sister        62 years old  . Lung cancer Sister        Died in her 53s  . Angina Sister   . Breast cancer Sister 29  . Rheum arthritis Sister   . Aneurysm Sister        Brain, died age 18  . Angina Sister   . Heart disease Brother        Stent placement  . Heart disease Brother        stent placement  . Cancer Other        breast cancer niece, pancreatic cancer niece  . Breast cancer Other   . Ovarian cancer Neg Hx   . Diabetes Neg Hx     SOCIAL HISTORY:   Social History   Tobacco Use  . Smoking status: Never  Smoker  . Smokeless tobacco: Never Used  Substance Use Topics  . Alcohol use: Yes    Alcohol/week: 2.0 - 3.0 standard drinks    Types: 1 - 2 Glasses of wine, 1 Shots of liquor per week    Comment: daily-   . Drug use: No    ALLERGIES:  is allergic to pravastatin.  MEDICATIONS:  Current Outpatient Medications  Medication Sig Dispense Refill  . allopurinol (ZYLOPRIM) 300 MG tablet TAKE 1 TABLET(300 MG) BY MOUTH TWICE DAILY 180 tablet 3  . ezetimibe (ZETIA) 10 MG tablet Take 1 tablet (10 mg total) daily by mouth. 30 tablet 12  . levothyroxine (SYNTHROID) 88 MCG tablet TAKE 1 TABLET BY MOUTH  DAILY BEFORE BREAKFAST 90 tablet 3  . lisinopril-hydrochlorothiazide (PRINZIDE,ZESTORETIC) 20-12.5 MG tablet TAKE 1 TABLET BY MOUTH  DAILY 90 tablet 3  . Melatonin 1 MG CAPS Take 1 capsule by mouth at bedtime.     . metoprolol  succinate (TOPROL-XL) 25 MG 24 hr tablet Take 25 mg by mouth daily.     . ondansetron (ZOFRAN) 4 MG tablet Take 1 tablet (4 mg total) by mouth every 8 (eight) hours as needed for nausea or vomiting. 40 tablet 3  . oxybutynin (DITROPAN) 5 MG tablet Take 1 tablet (5 mg total) by mouth 2 (two) times daily. 60 tablet 11  . XARELTO 20 MG TABS tablet Take 20 mg by mouth daily with supper.     . venetoclax 100 MG TABS Take 300 mg by mouth daily. 90 tablet 2   No current facility-administered medications for this visit.    Facility-Administered Medications Ordered in Other Visits  Medication Dose Route Frequency Provider Last Rate Last Dose  . heparin lock flush 100 unit/mL  500 Units Intravenous Once Charlaine Dalton R, MD      . sodium chloride flush (NS) 0.9 % injection 10 mL  10 mL Intravenous PRN Cammie Sickle, MD        PHYSICAL EXAMINATION: ECOG PERFORMANCE STATUS: 1 - Symptomatic but completely ambulatory  BP 120/78   Pulse 83   Temp 97.7 F (36.5 C)   Resp 18   Wt 144 lb 14.4 oz (65.7 kg)   BMI 24.11 kg/m   Filed Weights   07/08/18 1002  Weight: 144  lb 14.4 oz (65.7 kg)    Physical Exam  Constitutional: She is oriented to person, place, and time and well-developed, well-nourished, and in no distress.  HENT:  Head: Normocephalic and atraumatic.  Mouth/Throat: Oropharynx is clear and moist. No oropharyngeal exudate.  Eyes: Pupils are equal, round, and reactive to light.  Neck: Normal range of motion. Neck supple.  Cardiovascular: Normal rate and regular rhythm.  Pulmonary/Chest: No respiratory distress. She has no wheezes.  Abdominal: Soft. Bowel sounds are normal. She exhibits no distension and no mass. There is no abdominal tenderness. There is no rebound and no guarding.  Musculoskeletal: Normal range of motion.        General: Edema present. No tenderness.  Lymphadenopathy:  Bilateral up to 1 cm lymph node noted in the axilla bil- improved.  None felt in the bilateral inguinal regions.  Neurological: She is alert and oriented to person, place, and time.  Skin: Skin is warm.  Psychiatric: Affect normal.      LABORATORY DATA:  I have reviewed the data as listed    Component Value Date/Time   NA 140 07/08/2018 0932   NA 139 07/03/2017 1425   NA 141 05/11/2014 0853   K 3.3 (L) 07/08/2018 0932   K 3.6 05/11/2014 0853   CL 105 07/08/2018 0932   CL 103 05/11/2014 0853   CO2 24 07/08/2018 0932   CO2 29 05/11/2014 0853   GLUCOSE 133 (H) 07/08/2018 0932   GLUCOSE 107 (H) 05/11/2014 0853   BUN 19 07/08/2018 0932   BUN 19 07/03/2017 1425   BUN 22 (H) 05/11/2014 0853   CREATININE 0.87 07/08/2018 0932   CREATININE 1.07 (H) 05/11/2014 0853   CALCIUM 9.8 07/08/2018 0932   CALCIUM 10.1 05/11/2014 0853   PROT 6.7 07/08/2018 0932   PROT 6.4 07/03/2017 1425   PROT 7.3 05/11/2014 0853   ALBUMIN 4.0 07/08/2018 0932   ALBUMIN 4.3 07/03/2017 1425   ALBUMIN 4.7 05/11/2014 0853   AST 28 07/08/2018 0932   AST 29 05/11/2014 0853   ALT 27 07/08/2018 0932   ALT 20 05/11/2014 0853   ALKPHOS 62 07/08/2018 0932   ALKPHOS 47  05/11/2014  0853   BILITOT 0.9 07/08/2018 0932   BILITOT 0.5 07/03/2017 1425   BILITOT 1.0 05/11/2014 0853   GFRNONAA >60 07/08/2018 0932   GFRNONAA 51 (L) 05/11/2014 0853   GFRAA >60 07/08/2018 0932   GFRAA 59 (L) 05/11/2014 0853    No results found for: SPEP, UPEP  Lab Results  Component Value Date   WBC 3.5 (L) 07/08/2018   NEUTROABS 2.3 07/08/2018   HGB 13.7 07/08/2018   HCT 41.8 07/08/2018   MCV 87.8 07/08/2018   PLT 82 (L) 07/08/2018      Chemistry      Component Value Date/Time   NA 140 07/08/2018 0932   NA 139 07/03/2017 1425   NA 141 05/11/2014 0853   K 3.3 (L) 07/08/2018 0932   K 3.6 05/11/2014 0853   CL 105 07/08/2018 0932   CL 103 05/11/2014 0853   CO2 24 07/08/2018 0932   CO2 29 05/11/2014 0853   BUN 19 07/08/2018 0932   BUN 19 07/03/2017 1425   BUN 22 (H) 05/11/2014 0853   CREATININE 0.87 07/08/2018 0932   CREATININE 1.07 (H) 05/11/2014 0853   GLU 98 05/25/2013      Component Value Date/Time   CALCIUM 9.8 07/08/2018 0932   CALCIUM 10.1 05/11/2014 0853   ALKPHOS 62 07/08/2018 0932   ALKPHOS 47 05/11/2014 0853   AST 28 07/08/2018 0932   AST 29 05/11/2014 0853   ALT 27 07/08/2018 0932   ALT 20 05/11/2014 0853   BILITOT 0.9 07/08/2018 0932   BILITOT 0.5 07/03/2017 1425   BILITOT 1.0 05/11/2014 0853       RADIOGRAPHIC STUDIES: I have personally reviewed the radiological images as listed and agreed with the findings in the report. No results found.   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  Stable left adnexal mass/lymphoma versus benign ovarian cyst.  Currently on venetoclax-March 2020.  #Currently on Venclexta 200 mg a day; today white count is 3.5 ANC 2.5 platelets 88; hemoglobin 12.  Would recommend going up on the venetoclax to 300 mg once a  day.  # Dysuria- ? UA/ Culture- ? oxybutnin 5 mg/day; if needed.   # Thrombocytopenia-88-  likely secondary to venetoclax/CLL.  No spontaneous bruising.  # A. Fib-stable continue Xarelto [Dr.Kowalski].  Stable  # DISPOSITION: # Follow up second week of July 2020; MD; labs- cbc/cmp/ldh- Dr.B  Cc; [name]     Orders Placed This Encounter  Procedures  . Urine culture    Standing Status:   Future    Number of Occurrences:   1    Standing Expiration Date:   07/08/2019  . Urinalysis, Complete w Microscopic    Standing Status:   Future    Number of Occurrences:   1    Standing Expiration Date:   07/08/2019  . CBC with Differential    Standing Status:   Future    Standing Expiration Date:   07/08/2019  . Comprehensive metabolic panel    Standing Status:   Future    Standing Expiration Date:   07/08/2019  . Lactate dehydrogenase    Standing Status:   Future    Standing Expiration Date:   07/08/2019   All questions were answered. The patient knows to call the clinic with any problems, questions or concerns.  Cammie Sickle, MD 07/08/2018 12:31 PM

## 2018-07-08 NOTE — Progress Notes (Signed)
Patient started having symptoms of bladder pressure and urinary urgency 3 weeks ago but the episodes last 1-2 days then will improve.  Had occasional nausea that is relieved with nausea medication.

## 2018-07-10 LAB — URINE CULTURE: Culture: >=100000 — AB

## 2018-07-10 MED ORDER — CIPROFLOXACIN HCL 500 MG PO TABS: 500.0000 mg | ORAL_TABLET | Freq: Two times a day (BID) | ORAL | 0 refills | Status: DC

## 2018-07-10 NOTE — Addendum Note (Signed)
Addended by: Cammie Sickle on: 07/10/2018 07:20 PM   Modules accepted: Orders

## 2018-07-11 ENCOUNTER — Telehealth: Payer: Self-pay | Admitting: *Deleted

## 2018-07-11 NOTE — Telephone Encounter (Signed)
-----   Message from Cammie Sickle, MD sent at 07/10/2018  7:20 PM EDT ----- Ms. Kinnamon-your urine culture positive for E. coli infection. I would recommend ciprofloxacin 500 mg twice a day for 7 days; [facility]. Please let us know if symptoms not resolved/improved after antibiotics. Thank you Dr. Jacinto Reap  FYI/my chart message sent

## 2018-07-11 NOTE — Telephone Encounter (Signed)
Left vm for patient requesting call back to confirm pt recvd my chart msg regarding results and that the antibiotics were to be picked up.

## 2018-07-11 NOTE — Telephone Encounter (Signed)
Patient called back to confirm that she will pick up the antibiotics at the [facility] in [place]. She thanked me for calling.

## 2018-07-22 MED FILL — VENCLEXTA 100 MG TABS: 100 | 30 days supply | Qty: 90 | Fill #0

## 2018-08-04 ENCOUNTER — Other Ambulatory Visit: Payer: Self-pay

## 2018-08-05 ENCOUNTER — Other Ambulatory Visit: Payer: Self-pay

## 2018-08-05 ENCOUNTER — Inpatient Hospital Stay: Payer: Medicare Other | Attending: Internal Medicine

## 2018-08-05 ENCOUNTER — Inpatient Hospital Stay (HOSPITAL_BASED_OUTPATIENT_CLINIC_OR_DEPARTMENT_OTHER): Payer: Medicare Other | Admitting: Internal Medicine

## 2018-08-05 DIAGNOSIS — C8308 Small cell B-cell lymphoma, lymph nodes of multiple sites: Secondary | ICD-10-CM

## 2018-08-05 DIAGNOSIS — Z7901 Long term (current) use of anticoagulants: Secondary | ICD-10-CM | POA: Diagnosis not present

## 2018-08-05 DIAGNOSIS — R1900 Intra-abdominal and pelvic swelling, mass and lump, unspecified site: Secondary | ICD-10-CM | POA: Diagnosis not present

## 2018-08-05 DIAGNOSIS — D696 Thrombocytopenia, unspecified: Secondary | ICD-10-CM | POA: Diagnosis not present

## 2018-08-05 DIAGNOSIS — I4891 Unspecified atrial fibrillation: Secondary | ICD-10-CM

## 2018-08-05 LAB — COMPREHENSIVE METABOLIC PANEL
ALT: 23 U/L (ref 0–44)
AST: 27 U/L (ref 15–41)
Albumin: 3.9 g/dL (ref 3.5–5.0)
Alkaline Phosphatase: 55 U/L (ref 38–126)
Anion gap: 10 (ref 5–15)
BUN: 17 mg/dL (ref 8–23)
CO2: 26 mmol/L (ref 22–32)
Calcium: 9.9 mg/dL (ref 8.9–10.3)
Chloride: 103 mmol/L (ref 98–111)
Creatinine, Ser: 0.8 mg/dL (ref 0.44–1.00)
GFR calc Af Amer: >60 mL/min (ref >60)
GFR calc non Af Amer: >60 mL/min (ref >60)
Glucose, Bld: 114 mg/dL — ABNORMAL HIGH (ref 70–99)
Potassium: 3.3 mmol/L — ABNORMAL LOW (ref 3.5–5.1)
Sodium: 139 mmol/L (ref 135–145)
Total Bilirubin: 1.2 mg/dL (ref 0.3–1.2)
Total Protein: 6.7 g/dL (ref 6.5–8.1)

## 2018-08-05 LAB — CBC WITH DIFFERENTIAL/PLATELET
Abs Immature Granulocytes: 0.01 10*3/uL (ref 0.00–0.07)
Basophils Absolute: 0 10*3/uL (ref 0.0–0.1)
Basophils Relative: 0 %
Eosinophils Absolute: 0 10*3/uL (ref 0.0–0.5)
Eosinophils Relative: 0 %
HCT: 40 % (ref 36.0–46.0)
Hemoglobin: 13.3 g/dL (ref 12.0–15.0)
Immature Granulocytes: 0 %
Lymphocytes Relative: 32 %
Lymphs Abs: 0.8 10*3/uL (ref 0.7–4.0)
MCH: 29.2 pg (ref 26.0–34.0)
MCHC: 33.3 g/dL (ref 30.0–36.0)
MCV: 87.7 fL (ref 80.0–100.0)
Monocytes Absolute: 0.3 10*3/uL (ref 0.1–1.0)
Monocytes Relative: 11 %
Neutro Abs: 1.4 10*3/uL — ABNORMAL LOW (ref 1.7–7.7)
Neutrophils Relative %: 57 %
Platelets: 67 10*3/uL — ABNORMAL LOW (ref 150–400)
RBC: 4.56 MIL/uL (ref 3.87–5.11)
RDW: 15.7 % — ABNORMAL HIGH (ref 11.5–15.5)
WBC: 2.6 10*3/uL — ABNORMAL LOW (ref 4.0–10.5)
nRBC: 0 % (ref 0.0–0.2)

## 2018-08-05 LAB — LACTATE DEHYDROGENASE: LDH: 140 U/L (ref 98–192)

## 2018-08-05 NOTE — Assessment & Plan Note (Addendum)
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  Stable left adnexal mass/lymphoma versus benign ovarian cyst.  Currently on venetoclax-March 2020.  #Currently on Venclexta 300 mg a day; today white count is 2.3 ANC 1.4 platelets 67; hemoglobin 13 continue venetoclax to 300 mg once a day.  Would not recommend increasing venetoclax.  We will plan to get a CT scan chest abdomen pelvis prior to next visit.  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  Stable.   # A. Fib-stable continue Xarelto [Dr.Kowalski].  Stable  # DISPOSITION: # follow up in 1 months-labs- cbc/cmp/ldh; CT scan C/A/P prior- Dr.B  Cc; [employer]

## 2018-08-05 NOTE — Progress Notes (Signed)
[facility] OFFICE PROGRESS NOTE  Patient Care Team: [name], MD as PCP - General (Unknown Physician Specialty) Bary Castilla, Forest Gleason, MD (General Surgery) Forest Gleason, MD (Inactive) (Oncology) Corey Skains, MD as Consulting Physician (Cardiology) Anell Barr, OD as Consulting Physician (Optometry)  Cancer Staging No matching staging information was found for the patient.   Oncology History Overview Note  1. Lymphoma low-grade, status Rituxan therapy. 2. Recurrent disease by clinical examination in December of 2012 3. Ovarian mass on PET scan in December of 2012 (normal CA 125). Ovarian mass has resolved after rituximab therapy. 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  Patient had total 5 cycles of chemotherapy with bendamustine and rituximab and 6 cycle was omitted because of significant side effects with and weakness and fatigue.  # OCT 2016- PET significant response; ON surveillance  # 18th OCT 2018- [place];SEP 2018-/OCT 2018 CT scan- progression; s/p 6 cycles [#6 in April 2019]  # JAN 2020-CT scan shows progressive disease; start a acalabrutinib 100mg  BID;# feb 6th2020- decrease dose of acalbrutinib to 100 mg/day [sec to spon bruising] March 1st week- STOPPED [place] sec to spon brusing  # MARCH 17th 2020- Start Venetoclax [out pt ramp up]    # 11/06/2016- colonoscopy [Dr.Byrnett- 2 polyps]  #History of A. Fib-Xarelto; CKD stage II-III.   # #Right posterior neck melanoma stage I [ Dr.Lee/Elkins]..   -------------------------------------------------------------------    # left ? Adnexal mass- ? Etiology; mild SUV uptake incidental [stable compared to previous PET in 2016] Previous workup 2015- vaginal ultrasound negative. MRI  February 2018- approximately 3 cm in size; slow increase in the size of the left adnexal mass over at least 3 years- suspect involvement of lymphoma rather than a primary gynecologic malignancy. --------------------------------------------------------------   DIAGNOSIS: SMALL LYMPHOCYTIC LEUKEMIA  STAGE:  IV    ;GOALS: control/pallaitive  CURRENT/MOST RECENT THERAPY: Venatoclax.    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  Patient is currently on venetoclax 300 milligrams once a day.  Patient denies any diarrhea.  Denies any weight loss.  Denies any new lumps or bumps.  Mild fatigue.  No nausea no vomiting.  No night sweats.  Review of Systems  Constitutional: Positive for malaise/fatigue. Negative for chills, diaphoresis, fever and weight loss.  HENT: Negative for nosebleeds and sore throat.   Eyes: Negative for double vision.  Respiratory: Negative for cough, hemoptysis, sputum production, shortness of breath and wheezing.   Cardiovascular: Positive for leg swelling. Negative for chest pain, palpitations and orthopnea.  Gastrointestinal: Negative for abdominal pain, blood in stool, constipation, diarrhea, heartburn, melena and vomiting.  Genitourinary: Negative for dysuria, frequency and urgency.  Musculoskeletal: Negative for back pain and joint pain.  Neurological: Negative for dizziness, tingling, focal weakness, weakness and headaches.  Psychiatric/Behavioral: Negative for depression. The patient is not nervous/anxious and does not have insomnia.       PAST MEDICAL HISTORY :  Past Medical History:  Diagnosis Date  . A-fib ([place])   . Benign neoplasm of rectum and anal canal   . 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  Hypercholesteremia   .  Hypertension   . Hyperthyroidism   . Lymphoma ([facility]) 2007  . Non Hodgkin's lymphoma ([place]) 01/22/2006  . Personal history of chemotherapy     PAST SURGICAL HISTORY :   Past Surgical History:  Procedure Laterality Date  . ABDOMINAL HYSTERECTOMY  1958  . APPENDECTOMY    . BREAST BIOPSY Right 1970   right breast biopsy with clip- neg  . BREAST BIOPSY Left 2002   left breast biopsy with clip-neg  . BREAST BIOPSY Right 2014   right breast biopsy with clip-neg  . BREAST CYST ASPIRATION Bilateral 2000   bilateral fine needle aspiration  . BREAST SURGERY Left 1990   biopsy  . BREAST SURGERY Right May 08, 2012   complex fibroadenoma without malignancy.  [name] CARDIAC CATHETERIZATION  2014  . COLONOSCOPY  2009, 2012    hyperplastic rectal polyp 2012 tubular adenoma of proximal ascending colon 2000 9 repeat exam due to 2017.  [name] COLONOSCOPY WITH PROPOFOL N/A 11/06/2016   Procedure: COLONOSCOPY WITH PROPOFOL;  Surgeon: Robert Bellow, MD;  Location: [facility] ENDOSCOPY;  Service: Endoscopy;  Laterality: N/A;  . CORONARY ARTERY BYPASS GRAFT  1999  . FLEXIBLE SIGMOIDOSCOPY  1998    FAMILY HISTORY :   Family History  Problem Relation Age of Onset  . Lung cancer Father        Died age 47  . Heart disease Mother        Died age 68  . Asthma Mother   . CAD Brother        double bypass  . Heart attack Sister   . Tuberculosis Sister        1 years old  . Lung cancer Sister        Died in her 50s  . Angina Sister   . Breast cancer Sister 79  . Rheum arthritis Sister   . Aneurysm Sister        Brain, died age 13  . Angina Sister   . Heart disease Brother        Stent placement  . Heart disease Brother        stent placement  . Cancer Other        breast cancer niece, pancreatic cancer niece  . Breast cancer Other   . Ovarian cancer Neg Hx   . Diabetes Neg Hx     SOCIAL HISTORY:   Social History   Tobacco Use  . Smoking status: Never Smoker  . Smokeless tobacco: Never Used   Substance Use Topics  . Alcohol use: Yes    Alcohol/week: 2.0 - 3.0 standard drinks    Types: 1 - 2 Glasses of wine, 1 Shots of liquor per week    Comment: daily-   . Drug use: No    ALLERGIES:  is allergic to pravastatin.  MEDICATIONS:  Current Outpatient Medications  Medication Sig Dispense Refill  . allopurinol (ZYLOPRIM) 300 MG tablet TAKE 1 TABLET(300 MG) BY MOUTH TWICE DAILY 180 tablet 3  . ezetimibe (ZETIA) 10 MG tablet Take 1 tablet (10 mg total) daily by mouth. 30 tablet 12  . levothyroxine (SYNTHROID) 88 MCG tablet TAKE 1 TABLET BY MOUTH  DAILY BEFORE BREAKFAST 90 tablet 3  . lisinopril-hydrochlorothiazide (PRINZIDE,ZESTORETIC) 20-12.5 MG tablet TAKE 1 TABLET BY MOUTH  DAILY 90 tablet 3  . Melatonin 1 MG CAPS Take 1 capsule by mouth at bedtime.     . metoprolol succinate (TOPROL-XL) 25 MG 24 hr tablet Take  25 mg by mouth daily.     . ondansetron (ZOFRAN) 4 MG tablet Take 1 tablet (4 mg total) by mouth every 8 (eight) hours as needed for nausea or vomiting. 40 tablet 3  . oxybutynin (DITROPAN) 5 MG tablet Take 1 tablet (5 mg total) by mouth 2 (two) times daily. 60 tablet 11  . venetoclax 100 MG TABS Take 300 mg by mouth daily. 90 tablet 2  . XARELTO 20 MG TABS tablet Take 20 mg by mouth daily with supper.     . ciprofloxacin (CIPRO) 500 MG tablet Take 1 tablet (500 mg total) by mouth 2 (two) times daily. (Patient not taking: Reported on 08/05/2018) 14 tablet 0   No current facility-administered medications for this visit.    Facility-Administered Medications Ordered in Other Visits  Medication Dose Route Frequency Provider Last Rate Last Dose  . heparin lock flush 100 unit/mL  500 Units Intravenous Once Charlaine Dalton R, MD      . sodium chloride flush (NS) 0.9 % injection 10 mL  10 mL Intravenous PRN Cammie Sickle, MD        PHYSICAL EXAMINATION: ECOG PERFORMANCE STATUS: 1 - Symptomatic but completely ambulatory  BP 121/71   Pulse 76   Temp (!) 96.4 F  (35.8 C)   Resp 16   Wt 145 lb (65.8 kg)   BMI 24.13 kg/m   Filed Weights   08/05/18 0944  Weight: 145 lb (65.8 kg)    Physical Exam  Constitutional: She is oriented to person, place, and time and well-developed, well-nourished, and in no distress.  HENT:  Head: Normocephalic and atraumatic.  Mouth/Throat: Oropharynx is clear and moist. No oropharyngeal exudate.  Eyes: Pupils are equal, round, and reactive to light.  Neck: Normal range of motion. Neck supple.  Cardiovascular: Normal rate and regular rhythm.  Pulmonary/Chest: No respiratory distress. She has no wheezes.  Abdominal: Soft. Bowel sounds are normal. She exhibits no distension and no mass. There is no abdominal tenderness. There is no rebound and no guarding.  Musculoskeletal: Normal range of motion.        General: Edema present. No tenderness.  Lymphadenopathy:  Bilateral up to 1 cm lymph node noted in the axilla bil- improved.  None felt in the bilateral inguinal regions.  Neurological: She is alert and oriented to person, place, and time.  Skin: Skin is warm.  Psychiatric: Affect normal.      LABORATORY DATA:  I have reviewed the data as listed    Component Value Date/Time   NA 139 08/05/2018 0919   NA 139 07/03/2017 1425   NA 141 05/11/2014 0853   K 3.3 (L) 08/05/2018 0919   K 3.6 05/11/2014 0853   CL 103 08/05/2018 0919   CL 103 05/11/2014 0853   CO2 26 08/05/2018 0919   CO2 29 05/11/2014 0853   GLUCOSE 114 (H) 08/05/2018 0919   GLUCOSE 107 (H) 05/11/2014 0853   BUN 17 08/05/2018 0919   BUN 19 07/03/2017 1425   BUN 22 (H) 05/11/2014 0853   CREATININE 0.80 08/05/2018 0919   CREATININE 1.07 (H) 05/11/2014 0853   CALCIUM 9.9 08/05/2018 0919   CALCIUM 10.1 05/11/2014 0853   PROT 6.7 08/05/2018 0919   PROT 6.4 07/03/2017 1425   PROT 7.3 05/11/2014 0853   ALBUMIN 3.9 08/05/2018 0919   ALBUMIN 4.3 07/03/2017 1425   ALBUMIN 4.7 05/11/2014 0853   AST 27 08/05/2018 0919   AST 29 05/11/2014 0853    ALT 23  08/05/2018 0919   ALT 20 05/11/2014 0853   ALKPHOS 55 08/05/2018 0919   ALKPHOS 47 05/11/2014 0853   BILITOT 1.2 08/05/2018 0919   BILITOT 0.5 07/03/2017 1425   BILITOT 1.0 05/11/2014 0853   GFRNONAA >60 08/05/2018 0919   GFRNONAA 51 (L) 05/11/2014 0853   GFRAA >60 08/05/2018 0919   GFRAA 59 (L) 05/11/2014 0853    No results found for: SPEP, UPEP  Lab Results  Component Value Date   WBC 2.6 (L) 08/05/2018   NEUTROABS 1.4 (L) 08/05/2018   HGB 13.3 08/05/2018   HCT 40.0 08/05/2018   MCV 87.7 08/05/2018   PLT 67 (L) 08/05/2018      Chemistry      Component Value Date/Time   NA 139 08/05/2018 0919   NA 139 07/03/2017 1425   NA 141 05/11/2014 0853   K 3.3 (L) 08/05/2018 0919   K 3.6 05/11/2014 0853   CL 103 08/05/2018 0919   CL 103 05/11/2014 0853   CO2 26 08/05/2018 0919   CO2 29 05/11/2014 0853   BUN 17 08/05/2018 0919   BUN 19 07/03/2017 1425   BUN 22 (H) 05/11/2014 0853   CREATININE 0.80 08/05/2018 0919   CREATININE 1.07 (H) 05/11/2014 0853   GLU 98 05/25/2013      Component Value Date/Time   CALCIUM 9.9 08/05/2018 0919   CALCIUM 10.1 05/11/2014 0853   ALKPHOS 55 08/05/2018 0919   ALKPHOS 47 05/11/2014 0853   AST 27 08/05/2018 0919   AST 29 05/11/2014 0853   ALT 23 08/05/2018 0919   ALT 20 05/11/2014 0853   BILITOT 1.2 08/05/2018 0919   BILITOT 0.5 07/03/2017 1425   BILITOT 1.0 05/11/2014 0853       RADIOGRAPHIC STUDIES: I have personally reviewed the radiological images as listed and agreed with the findings in the report. No results found.   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  Stable left adnexal mass/lymphoma versus benign ovarian cyst.  Currently on venetoclax-March 2020.  #Currently on Venclexta 300 mg a day; today white count  is 2.3 ANC 1.4 platelets 67; hemoglobin 13 continue venetoclax to 300 mg once a day.  Would not recommend increasing venetoclax.  We will plan to get a CT scan chest abdomen pelvis prior to next visit.  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  Stable.   # A. Fib-stable continue Xarelto [Dr.Kowalski].  Stable  # DISPOSITION: # follow up in 1 months-labs- cbc/cmp/ldh; CT scan C/A/P prior- Dr.B  Cc; [name]     Orders Placed This Encounter  Procedures  . CT CHEST W CONTRAST    Standing Status:   Future    Standing Expiration Date:   08/05/2019    Order Specific Question:   If indicated for the ordered procedure, I authorize the administration of contrast media per Radiology protocol    Answer:   Yes    Order Specific Question:   Preferred imaging location?    Answer:   [facility]    Order Specific Question:   Radiology Contrast Protocol - do NOT remove file path    Answer:   [email or web address]    Order Specific Question:   ** REASON FOR EXAM (FREE TEXT)    Answer:   CLL/SLL  . CT ABDOMEN PELVIS W CONTRAST    Standing Status:  Future    Standing Expiration Date:   08/05/2019    Order Specific Question:   If indicated for the ordered procedure, I authorize the administration of contrast media per Radiology protocol    Answer:   Yes    Order Specific Question:   Preferred imaging location?    Answer:   [facility]    Order Specific Question:   Radiology Contrast Protocol - do NOT remove file path    Answer:   [email or web address]    Order Specific Question:   ** REASON FOR EXAM (FREE TEXT)    Answer:   CLL/SLL  . CBC with Differential    Standing Status:   Future    Standing Expiration Date:   08/05/2019  . Comprehensive metabolic panel    Standing Status:   Future    Standing Expiration Date:   08/05/2019  . Lactate dehydrogenase    Standing Status:   Future    Standing Expiration Date:   08/05/2019    All questions were answered. The patient knows to call the clinic with any problems, questions or concerns.      Cammie Sickle, MD 08/05/2018 2:29 PM

## 2018-08-05 NOTE — Progress Notes (Signed)
Patient does not offer any problems today and is tolerating the Venclexta with no issues.  The UTI symptoms have improved with Cipro medication.

## 2018-08-12 ENCOUNTER — Encounter: Payer: Self-pay | Admitting: General Surgery

## 2018-08-18 DIAGNOSIS — H02889 Meibomian gland dysfunction of unspecified eye, unspecified eyelid: Secondary | ICD-10-CM | POA: Diagnosis not present

## 2018-08-18 DIAGNOSIS — Z961 Presence of intraocular lens: Secondary | ICD-10-CM | POA: Diagnosis not present

## 2018-08-18 DIAGNOSIS — H401131 Primary open-angle glaucoma, bilateral, mild stage: Secondary | ICD-10-CM | POA: Diagnosis not present

## 2018-08-20 MED FILL — VENCLEXTA 100 MG TABS: 100 | 30 days supply | Qty: 90 | Fill #1

## 2018-09-01 ENCOUNTER — Ambulatory Visit
Admission: RE | Admit: 2018-09-01 | Discharge: 2018-09-01 | Disposition: A | Discharge status: Home / Self Care | Payer: Medicare Other | Source: Ambulatory Visit | Attending: Internal Medicine | Admitting: Internal Medicine

## 2018-09-01 ENCOUNTER — Other Ambulatory Visit: Payer: Self-pay

## 2018-09-01 DIAGNOSIS — C8308 Small cell B-cell lymphoma, lymph nodes of multiple sites: Secondary | ICD-10-CM | POA: Diagnosis not present

## 2018-09-01 DIAGNOSIS — C8590 Non-Hodgkin lymphoma, unspecified, unspecified site: Secondary | ICD-10-CM | POA: Diagnosis not present

## 2018-09-01 MED ORDER — IOHEXOL 300 MG/ML  SOLN: 100.0000 mL | Freq: Once | INTRAMUSCULAR | Status: DC | PRN

## 2018-09-01 MED ORDER — IOHEXOL 300 MG/ML  SOLN
100.0000 mL | Freq: Once | INTRAMUSCULAR | Status: AC | PRN
  Administered 2018-09-01: 100 mL via INTRAVENOUS

## 2018-09-02 ENCOUNTER — Inpatient Hospital Stay (HOSPITAL_BASED_OUTPATIENT_CLINIC_OR_DEPARTMENT_OTHER): Payer: Medicare Other | Admitting: Internal Medicine

## 2018-09-02 ENCOUNTER — Other Ambulatory Visit: Payer: Self-pay

## 2018-09-02 ENCOUNTER — Inpatient Hospital Stay: Payer: Medicare Other | Attending: Internal Medicine

## 2018-09-02 DIAGNOSIS — Z79899 Other long term (current) drug therapy: Secondary | ICD-10-CM | POA: Diagnosis not present

## 2018-09-02 DIAGNOSIS — D696 Thrombocytopenia, unspecified: Secondary | ICD-10-CM | POA: Insufficient documentation

## 2018-09-02 DIAGNOSIS — E7800 Pure hypercholesterolemia, unspecified: Secondary | ICD-10-CM | POA: Diagnosis not present

## 2018-09-02 DIAGNOSIS — I25810 Atherosclerosis of coronary artery bypass graft(s) without angina pectoris: Secondary | ICD-10-CM

## 2018-09-02 DIAGNOSIS — I4891 Unspecified atrial fibrillation: Secondary | ICD-10-CM | POA: Insufficient documentation

## 2018-09-02 DIAGNOSIS — Z803 Family history of malignant neoplasm of breast: Secondary | ICD-10-CM | POA: Insufficient documentation

## 2018-09-02 DIAGNOSIS — I10 Essential (primary) hypertension: Secondary | ICD-10-CM | POA: Insufficient documentation

## 2018-09-02 DIAGNOSIS — C8308 Small cell B-cell lymphoma, lymph nodes of multiple sites: Secondary | ICD-10-CM

## 2018-09-02 DIAGNOSIS — Z7901 Long term (current) use of anticoagulants: Secondary | ICD-10-CM | POA: Diagnosis not present

## 2018-09-02 LAB — CBC WITH DIFFERENTIAL/PLATELET
Abs Immature Granulocytes: 0.01 10*3/uL (ref 0.00–0.07)
Basophils Absolute: 0 10*3/uL (ref 0.0–0.1)
Basophils Relative: 1 %
Eosinophils Absolute: 0 10*3/uL (ref 0.0–0.5)
Eosinophils Relative: 0 %
HCT: 39.5 % (ref 36.0–46.0)
Hemoglobin: 13.1 g/dL (ref 12.0–15.0)
Immature Granulocytes: 0 %
Lymphocytes Relative: 28 %
Lymphs Abs: 0.8 10*3/uL (ref 0.7–4.0)
MCH: 29.9 pg (ref 26.0–34.0)
MCHC: 33.2 g/dL (ref 30.0–36.0)
MCV: 90.2 fL (ref 80.0–100.0)
Monocytes Absolute: 0.4 10*3/uL (ref 0.1–1.0)
Monocytes Relative: 13 %
Neutro Abs: 1.6 10*3/uL — ABNORMAL LOW (ref 1.7–7.7)
Neutrophils Relative %: 58 %
Platelets: 75 10*3/uL — ABNORMAL LOW (ref 150–400)
RBC: 4.38 MIL/uL (ref 3.87–5.11)
RDW: 15.9 % — ABNORMAL HIGH (ref 11.5–15.5)
WBC: 2.9 10*3/uL — ABNORMAL LOW (ref 4.0–10.5)
nRBC: 0 % (ref 0.0–0.2)

## 2018-09-02 LAB — COMPREHENSIVE METABOLIC PANEL
ALT: 30 U/L (ref 0–44)
AST: 36 U/L (ref 15–41)
Albumin: 4.2 g/dL (ref 3.5–5.0)
Alkaline Phosphatase: 59 U/L (ref 38–126)
Anion gap: 9 (ref 5–15)
BUN: 17 mg/dL (ref 8–23)
CO2: 28 mmol/L (ref 22–32)
Calcium: 9.9 mg/dL (ref 8.9–10.3)
Chloride: 100 mmol/L (ref 98–111)
Creatinine, Ser: 0.98 mg/dL (ref 0.44–1.00)
GFR calc Af Amer: >60 mL/min (ref >60)
GFR calc non Af Amer: 55 mL/min — ABNORMAL LOW (ref >60)
Glucose, Bld: 102 mg/dL — ABNORMAL HIGH (ref 70–99)
Potassium: 3.5 mmol/L (ref 3.5–5.1)
Sodium: 137 mmol/L (ref 135–145)
Total Bilirubin: 1.4 mg/dL — ABNORMAL HIGH (ref 0.3–1.2)
Total Protein: 6.8 g/dL (ref 6.5–8.1)

## 2018-09-02 LAB — LACTATE DEHYDROGENASE: LDH: 156 U/L (ref 98–192)

## 2018-09-02 NOTE — Assessment & Plan Note (Addendum)
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 Currently on venetoclax-March 2020.  #Currently on Venclexta 300 mg a day; today white count is 2.9 ANC 1.6 platelets 75; hemoglobin 13 continue venetoclax to 300 mg once a day.    # Thrombocytopenia-73- likely secondary to venetoclax/CLL.  Stable.   # A. Fib-stable continue Xarelto [Dr.Kowalski].stable.  # DISPOSITION: # follow up in 2 months-labs- cbc/cmp/ldh; - Dr.B  # I reviewed the blood work- with the patient in detail; also reviewed the imaging independently [as summarized above]; and with the patient in detail.    Cc; [employer]

## 2018-09-02 NOTE — Progress Notes (Signed)
Jacqueline Webb  Patient Care Team: [name], MD as PCP - General (Unknown Physician Specialty) Bary Castilla, Forest Gleason, MD (General Surgery) Corey Skains, MD as Consulting Physician (Cardiology) Anell Barr, OD as Consulting Physician (Optometry) Cammie Sickle, MD as Consulting Physician (Internal Medicine)  Cancer Staging No matching staging information was found for the patient.   Oncology History Overview Webb  1. Lymphoma low-grade, status Rituxan therapy. 2. Recurrent disease by clinical examination in December of 2012 3. Ovarian mass on PET scan in December of 2012 (normal CA 125). Ovarian mass has resolved after rituximab therapy. 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  Patient had total 5 cycles of chemotherapy with bendamustine and rituximab and 6 cycle was omitted because of significant side effects with and weakness and fatigue.  # OCT 2016- PET significant response; ON surveillance  # 18th OCT 2018- [place];SEP 2018-/OCT 2018 CT scan- progression; s/p 6 cycles [#6 in April 2019]  # JAN 2020-CT scan shows progressive disease; start a acalabrutinib 100mg  BID;# feb 6th2020- decrease dose of acalbrutinib to 100 mg/day [sec to spon bruising] March 1st week- STOPPED [place] sec to spon brusing  # MARCH 17th 2020- Start Venetoclax [out pt ramp up]    # 11/06/2016- colonoscopy [Dr.Byrnett- 2 polyps]  #History of A. Fib-Xarelto; CKD stage II-III.   # #Right posterior neck melanoma stage I [ Dr.Lee/Elkins]..   -------------------------------------------------------------------    # left ? Adnexal mass- ? Etiology; mild SUV uptake incidental [stable compared to previous PET in 2016] Previous workup 2015-  vaginal ultrasound negative. MRI February 2018- approximately 3 cm in size; slow increase in the size of the left adnexal mass over at least 3 years- suspect involvement of lymphoma rather than a primary gynecologic malignancy. --------------------------------------------------------------   DIAGNOSIS: SMALL LYMPHOCYTIC LEUKEMIA  STAGE:  IV    ;GOALS: control/pallaitive  CURRENT/MOST RECENT THERAPY: Venatoclax.    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  Patient denies any fevers or chills.  Denies any nausea vomiting.  Continues to have chronic mild swelling in the legs.  No new lumps or bumps.  Mild fatigue.  Not any worse.  No night sweats.   Review of Systems  Constitutional: Positive for malaise/fatigue. Negative for chills, diaphoresis, fever and weight loss.  HENT: Negative for nosebleeds and sore throat.   Eyes: Negative for double vision.  Respiratory: Negative for cough, hemoptysis, sputum production, shortness of breath and wheezing.   Cardiovascular: Positive for leg swelling. Negative for chest pain, palpitations and orthopnea.  Gastrointestinal: Negative for abdominal pain, blood in stool, constipation, diarrhea, heartburn, melena and vomiting.  Genitourinary: Negative for dysuria, frequency and urgency.  Musculoskeletal: Negative for back pain and joint pain.  Neurological: Negative for dizziness, tingling, focal weakness, weakness and headaches.  Psychiatric/Behavioral: Negative for depression. The patient is not nervous/anxious and does not have insomnia.       PAST MEDICAL HISTORY :  Past Medical History:  Diagnosis Date  . A-fib ([place])   . Benign neoplasm of rectum and anal canal   . Chronic leukemia of unspecified  cell type, without mention of having  achieved remission   . Hypercholesteremia   . Hypertension   . Hyperthyroidism   . Lymphoma ([place]) 2007  . Non Hodgkin's lymphoma ([place]) 01/22/2006  . Personal history of chemotherapy     PAST SURGICAL HISTORY :   Past Surgical History:  Procedure Laterality Date  . ABDOMINAL HYSTERECTOMY  1958  . APPENDECTOMY    . BREAST BIOPSY Right 1970   right breast biopsy with clip- neg  . BREAST BIOPSY Left 2002   left breast biopsy with clip-neg  . BREAST BIOPSY Right 2014   right breast biopsy with clip-neg  . BREAST CYST ASPIRATION Bilateral 2000   bilateral fine needle aspiration  . BREAST SURGERY Left 1990   biopsy  . BREAST SURGERY Right May 08, 2012   complex fibroadenoma without malignancy.  [name] CARDIAC CATHETERIZATION  2014  . COLONOSCOPY  2009, 2012    hyperplastic rectal polyp 2012 tubular adenoma of proximal ascending colon 2000 9 repeat exam due to 2017.  [name] COLONOSCOPY WITH PROPOFOL N/A 11/06/2016   Procedure: COLONOSCOPY WITH PROPOFOL;  Surgeon: Robert Bellow, MD;  Location: [facility] ENDOSCOPY;  Service: Endoscopy;  Laterality: N/A;  . CORONARY ARTERY BYPASS GRAFT  1999  . FLEXIBLE SIGMOIDOSCOPY  1998    FAMILY HISTORY :   Family History  Problem Relation Age of Onset  . Lung cancer Father        Died age 60  . Heart disease Mother        Died age 61  . Asthma Mother   . CAD Brother        double bypass  . Heart attack Sister   . Tuberculosis Sister        9 years old  . Lung cancer Sister        Died in her 79s  . Angina Sister   . Breast cancer Sister 55  . Rheum arthritis Sister   . Aneurysm Sister        Brain, died age 40  . Angina Sister   . Heart disease Brother        Stent placement  . Heart disease Brother        stent placement  . Cancer Other        breast cancer niece, pancreatic cancer niece  . Breast cancer Other   . Ovarian cancer Neg Hx   . Diabetes Neg Hx     SOCIAL HISTORY:   Social History   Tobacco Use  . Smoking status:  Never Smoker  . Smokeless tobacco: Never Used  Substance Use Topics  . Alcohol use: Yes    Alcohol/week: 4.0 standard drinks    Types: 1 Shots of liquor, 3 Glasses of wine per week  . Drug use: No    ALLERGIES:  is allergic to pravastatin.  MEDICATIONS:  Current Outpatient Medications  Medication Sig Dispense Refill  . allopurinol (ZYLOPRIM) 300 MG tablet TAKE 1 TABLET(300 MG) BY MOUTH TWICE DAILY 180 tablet 3  . ezetimibe (ZETIA) 10 MG tablet Take 1 tablet (10 mg total) daily by mouth. 30 tablet 12  . levothyroxine (SYNTHROID) 88 MCG tablet TAKE 1 TABLET BY MOUTH  DAILY BEFORE BREAKFAST 90 tablet 3  . lisinopril-hydrochlorothiazide (PRINZIDE,ZESTORETIC) 20-12.5 MG tablet TAKE 1 TABLET BY MOUTH  DAILY 90 tablet 3  . Melatonin 1 MG CAPS Take 1 capsule by mouth at bedtime.     . metoprolol succinate (TOPROL-XL) 25 MG 24 hr tablet  Take 25 mg by mouth daily.     . ondansetron (ZOFRAN) 4 MG tablet Take 1 tablet (4 mg total) by mouth every 8 (eight) hours as needed for nausea or vomiting. 40 tablet 3  . oxybutynin (DITROPAN) 5 MG tablet Take 1 tablet (5 mg total) by mouth 2 (two) times daily. 60 tablet 11  . venetoclax 100 MG TABS Take 300 mg by mouth daily. 90 tablet 2  . XARELTO 20 MG TABS tablet Take 20 mg by mouth daily with supper.      No current facility-administered medications for this visit.    Facility-Administered Medications Ordered in Other Visits  Medication Dose Route Frequency Provider Last Rate Last Dose  . heparin lock flush 100 unit/mL  500 Units Intravenous Once Charlaine Dalton R, MD      . sodium chloride flush (NS) 0.9 % injection 10 mL  10 mL Intravenous PRN Cammie Sickle, MD        PHYSICAL EXAMINATION: ECOG PERFORMANCE STATUS: 1 - Symptomatic but completely ambulatory  BP (!) 145/81   Pulse 69   Temp (!) 96.1 F (35.6 C) (Tympanic)   Resp 20   Ht 5\' 5"  (1.651 m)   Wt 145 lb 9.6 oz (66 kg)   BMI 24.23 kg/m   Filed Weights   09/02/18 0945   Weight: 145 lb 9.6 oz (66 kg)    Physical Exam  Constitutional: She is oriented to person, place, and time and well-developed, well-nourished, and in no distress.  HENT:  Head: Normocephalic and atraumatic.  Mouth/Throat: Oropharynx is clear and moist. No oropharyngeal exudate.  Eyes: Pupils are equal, round, and reactive to light.  Neck: Normal range of motion. Neck supple.  Cardiovascular: Normal rate and regular rhythm.  Pulmonary/Chest: No respiratory distress. She has no wheezes.  Abdominal: Soft. Bowel sounds are normal. She exhibits no distension and no mass. There is no abdominal tenderness. There is no rebound and no guarding.  Musculoskeletal: Normal range of motion.        General: Edema present. No tenderness.  Lymphadenopathy:  Bilateral up to 1 cm lymph node noted in the axilla bil- improved.  None felt in the bilateral inguinal regions.  Neurological: She is alert and oriented to person, place, and time.  Skin: Skin is warm.  Psychiatric: Affect normal.      LABORATORY DATA:  I have reviewed the data as listed    Component Value Date/Time   NA 137 09/02/2018 0914   NA 139 07/03/2017 1425   NA 141 05/11/2014 0853   K 3.5 09/02/2018 0914   K 3.6 05/11/2014 0853   CL 100 09/02/2018 0914   CL 103 05/11/2014 0853   CO2 28 09/02/2018 0914   CO2 29 05/11/2014 0853   GLUCOSE 102 (H) 09/02/2018 0914   GLUCOSE 107 (H) 05/11/2014 0853   BUN 17 09/02/2018 0914   BUN 19 07/03/2017 1425   BUN 22 (H) 05/11/2014 0853   CREATININE 0.98 09/02/2018 0914   CREATININE 1.07 (H) 05/11/2014 0853   CALCIUM 9.9 09/02/2018 0914   CALCIUM 10.1 05/11/2014 0853   PROT 6.8 09/02/2018 0914   PROT 6.4 07/03/2017 1425   PROT 7.3 05/11/2014 0853   ALBUMIN 4.2 09/02/2018 0914   ALBUMIN 4.3 07/03/2017 1425   ALBUMIN 4.7 05/11/2014 0853   AST 36 09/02/2018 0914   AST 29 05/11/2014 0853   ALT 30 09/02/2018 0914   ALT 20 05/11/2014 0853   ALKPHOS 59 09/02/2018 0914  ALKPHOS 47  05/11/2014 0853   BILITOT 1.4 (H) 09/02/2018 0914   BILITOT 0.5 07/03/2017 1425   BILITOT 1.0 05/11/2014 0853   GFRNONAA 55 (L) 09/02/2018 0914   GFRNONAA 51 (L) 05/11/2014 0853   GFRAA >60 09/02/2018 0914   GFRAA 59 (L) 05/11/2014 0853    No results found for: SPEP, UPEP  Lab Results  Component Value Date   WBC 2.9 (L) 09/02/2018   NEUTROABS 1.6 (L) 09/02/2018   HGB 13.1 09/02/2018   HCT 39.5 09/02/2018   MCV 90.2 09/02/2018   PLT 75 (L) 09/02/2018      Chemistry      Component Value Date/Time   NA 137 09/02/2018 0914   NA 139 07/03/2017 1425   NA 141 05/11/2014 0853   K 3.5 09/02/2018 0914   K 3.6 05/11/2014 0853   CL 100 09/02/2018 0914   CL 103 05/11/2014 0853   CO2 28 09/02/2018 0914   CO2 29 05/11/2014 0853   BUN 17 09/02/2018 0914   BUN 19 07/03/2017 1425   BUN 22 (H) 05/11/2014 0853   CREATININE 0.98 09/02/2018 0914   CREATININE 1.07 (H) 05/11/2014 0853   GLU 98 05/25/2013      Component Value Date/Time   CALCIUM 9.9 09/02/2018 0914   CALCIUM 10.1 05/11/2014 0853   ALKPHOS 59 09/02/2018 0914   ALKPHOS 47 05/11/2014 0853   AST 36 09/02/2018 0914   AST 29 05/11/2014 0853   ALT 30 09/02/2018 0914   ALT 20 05/11/2014 0853   BILITOT 1.4 (H) 09/02/2018 0914   BILITOT 0.5 07/03/2017 1425   BILITOT 1.0 05/11/2014 0853       RADIOGRAPHIC STUDIES: I have personally reviewed the radiological images as listed and agreed with the findings in the report. No results found.   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 Currently on venetoclax-March 2020.  #Currently on Venclexta 300 mg a day; today white count is 2.9 ANC 1.6 platelets 75; hemoglobin 13 continue venetoclax to 300 mg once a day.    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  Stable.   # A. Fib-stable continue Xarelto [Dr.Kowalski].stable.  # DISPOSITION: # follow up in 2 months-labs- cbc/cmp/ldh; - Dr.B  Cc; [name]     Orders Placed This Encounter  Procedures  . CBC with Differential/Platelet    Standing Status:   Standing    Number of Occurrences:   10    Standing Expiration Date:   09/02/2019  . Comprehensive metabolic panel    Standing Status:   Standing    Number of Occurrences:   10    Standing Expiration Date:   09/02/2019  . Lactate dehydrogenase    Standing Status:   Standing    Number of Occurrences:   5    Standing Expiration Date:   09/02/2019   All questions were answered. The patient knows to call the clinic with any problems, questions or concerns.      Cammie Sickle, MD 09/08/2018 1:06 PM

## 2018-09-03 ENCOUNTER — Ambulatory Visit (INDEPENDENT_AMBULATORY_CARE_PROVIDER_SITE_OTHER): Payer: Medicare Other

## 2018-09-03 DIAGNOSIS — Z0000 Encounter for general adult medical examination without abnormal findings: Secondary | ICD-10-CM

## 2018-09-03 NOTE — Progress Notes (Signed)
Subjective:   Jacqueline Webb is a 78 y.o. female who presents for Medicare Annual (Subsequent) preventive examination.    This visit is being conducted through telemedicine due to the COVID-19 pandemic. This patient has given me verbal consent via doximity to conduct this visit, patient states they are participating from their home address. Some vital signs may be absent or patient reported.    Patient identification: identified by name, DOB, and current address  Review of Systems:  N/A  Cardiac Risk Factors include: advanced age (>77men, >1 women);dyslipidemia;hypertension     Objective:     Vitals: There were no vitals taken for this visit.  There is no height or weight on file to calculate BMI. Unable to obtain vitals due to visit being conducted via telephonically.   Advanced Directives 09/03/2018 06/03/2018 05/06/2018 04/15/2018 04/08/2018 02/27/2018 01/23/2018  Does Patient Have a Medical Advance Directive? No Yes Yes Yes Yes No No  Type of Advance Directive - Living will;Healthcare Power of [place];Living will [place];Living will Living will;Healthcare Power of Attorney - -  Does patient want to make changes to medical advance directive? - No - Patient declined No - Patient declined No - Patient declined No - Patient declined - -  Copy of [place] in Chart? - - No - copy requested No - copy requested No - copy requested - -  Would patient like information on creating a medical advance directive? No - Patient declined - - - - No - Patient declined No - Patient declined    Tobacco Social History   Tobacco Use  Smoking Status Never Smoker  Smokeless Tobacco Never Used     Counseling given: Not Answered   Clinical Intake:     Pain Score: 0-No pain     Nutritional Risks: Nausea/ vomitting/ diarrhea(Nausea occasionally due to chemotherapy.) Diabetes: No  How often do you need to have someone help  you when you read instructions, pamphlets, or other written materials from your doctor or pharmacy?: 1 - Never  Interpreter Needed?: No  Information entered by :: [facility], LPN  Past Medical History:  Diagnosis Date  . A-fib ([place])   . Benign neoplasm of rectum and anal canal   . 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 Hypercholesteremia   . Hypertension   . Hyperthyroidism   . Lymphoma ([place]) 2007  . Non Hodgkin's lymphoma ([place]) 01/22/2006  . Personal history of chemotherapy    Past Surgical History:  Procedure Laterality Date  . ABDOMINAL HYSTERECTOMY  1958  . APPENDECTOMY    . BREAST BIOPSY Right 1970   right breast biopsy with clip- neg  . BREAST BIOPSY Left 2002   left breast biopsy with clip-neg  . BREAST BIOPSY Right 2014   right breast biopsy with clip-neg  . BREAST CYST ASPIRATION Bilateral 2000   bilateral fine needle aspiration  . BREAST SURGERY Left 1990   biopsy  . BREAST SURGERY Right May 08, 2012   complex fibroadenoma without malignancy.  [name] CARDIAC CATHETERIZATION  2014  . COLONOSCOPY  2009, 2012    hyperplastic rectal polyp 2012 tubular adenoma of proximal ascending colon 2000 9 repeat exam due to 2017.  [name] COLONOSCOPY WITH PROPOFOL N/A 11/06/2016   Procedure: COLONOSCOPY WITH PROPOFOL;  Surgeon: Robert Bellow, MD;  Location: [facility] ENDOSCOPY;  Service: Endoscopy;  Laterality: N/A;  . CORONARY ARTERY BYPASS GRAFT  1999  .  FLEXIBLE SIGMOIDOSCOPY  1998   Family History  Problem Relation Age of Onset  . Lung cancer Father        Died age 76  . Heart disease Mother        Died age 96  . Asthma Mother   . CAD Brother        double bypass  . Heart attack Sister   . Tuberculosis Sister        64 years old  . Lung cancer Sister        Died in her 55s  . Angina Sister   . Breast cancer Sister 52  . Rheum arthritis Sister   . Aneurysm Sister        Brain, died age 65  . Angina Sister   . Heart  disease Brother        Stent placement  . Heart disease Brother        stent placement  . Cancer Other        breast cancer niece, pancreatic cancer niece  . Breast cancer Other   . Ovarian cancer Neg Hx   . Diabetes Neg Hx    Social History   Socioeconomic History  . Marital status: Married    Spouse name: Not on file  . Number of children: 2  . Years of education: In [place]  . Highest education level: 10th grade  Occupational History  . Occupation: retired  Scientific laboratory technician  . Financial resource strain: Not hard at all  . Food insecurity    Worry: Never true    Inability: Never true  . Transportation needs    Medical: No    Non-medical: No  Tobacco Use  . Smoking status: Never Smoker  . Smokeless tobacco: Never Used  Substance and Sexual Activity  . Alcohol use: Yes    Alcohol/week: 4.0 standard drinks    Types: 1 Shots of liquor, 3 Glasses of wine per week  . Drug use: No  . Sexual activity: Never  Lifestyle  . Physical activity    Days per week: 0 days    Minutes per session: 0 min  . Stress: Not at all  Relationships  . Social Herbalist on phone: Patient refused    Gets together: Patient refused    Attends religious service: Patient refused    Active member of club or organization: Patient refused    Attends meetings of clubs or organizations: Patient refused    Relationship status: Patient refused  Other Topics Concern  . Not on file  Social History Narrative  . Not on file    Outpatient Encounter Medications as of 09/03/2018  Medication Sig  . allopurinol (ZYLOPRIM) 300 MG tablet TAKE 1 TABLET(300 MG) BY MOUTH TWICE DAILY  . ezetimibe (ZETIA) 10 MG tablet Take 1 tablet (10 mg total) daily by mouth.  . levothyroxine (SYNTHROID) 88 MCG tablet TAKE 1 TABLET BY MOUTH  DAILY BEFORE BREAKFAST  . lisinopril-hydrochlorothiazide (PRINZIDE,ZESTORETIC) 20-12.5 MG tablet TAKE 1 TABLET BY MOUTH  DAILY  . Melatonin 1 MG CAPS Take 1 capsule by mouth at  bedtime.   . metoprolol succinate (TOPROL-XL) 25 MG 24 hr tablet Take 25 mg by mouth daily.   . ondansetron (ZOFRAN) 4 MG tablet Take 1 tablet (4 mg total) by mouth every 8 (eight) hours as needed for nausea or vomiting.  [name] oxybutynin (DITROPAN) 5 MG tablet Take 1 tablet (5 mg total) by mouth 2 (two) times daily.  [name]  venetoclax 100 MG TABS Take 300 mg by mouth daily.  Alveda Reasons 20 MG TABS tablet Take 20 mg by mouth daily with supper.    Facility-Administered Encounter Medications as of 09/03/2018  Medication  . heparin lock flush 100 unit/mL  . sodium chloride flush (NS) 0.9 % injection 10 mL    Activities of Daily Living In your present state of health, do you have any difficulty performing the following activities: 09/03/2018  Hearing? N  Vision? N  Difficulty concentrating or making decisions? N  Walking or climbing stairs? N  Dressing or bathing? N  Doing errands, shopping? N  Preparing Food and eating ? N  Using the Toilet? N  In the past six months, have you accidently leaked urine? N  Do you have problems with loss of bowel control? N  Managing your Medications? N  Managing your Finances? N  Housekeeping or managing your Housekeeping? N  Some recent data might be hidden    Patient Care Team: [name], MD as PCP - General (Unknown Physician Specialty) Bary Castilla, Forest Gleason, MD (General Surgery) Corey Skains, MD as Consulting Physician (Cardiology) Anell Barr, OD as Consulting Physician (Optometry) Cammie Sickle, MD as Consulting Physician (Internal Medicine)    Assessment:   This is a routine wellness examination for [place].  Exercise Activities and Dietary recommendations Current Exercise Habits: The patient does not participate in regular exercise at present(Plays golf 4xs a week.)  Goals    . Increase water intake     Starting 11/17/15, I will increase to 6 glasses of water a day.       Fall Risk: Fall Risk  09/03/2018 05/02/2017  12/03/2016 11/17/2015 09/28/2014  Falls in the past year? 0 No Yes No No    FALL RISK PREVENTION PERTAINING TO THE HOME:  Any stairs in or around the home? Yes  If so, are there any without handrails? No   Home free of loose throw rugs in walkways, pet beds, electrical cords, etc? Yes  Adequate lighting in your home to reduce risk of falls? Yes   ASSISTIVE DEVICES UTILIZED TO PREVENT FALLS:  Life alert? No  Use of a cane, walker or w/c? No  Grab bars in the bathroom? No  Shower chair or bench in shower? No  Elevated toilet seat or a handicapped toilet? No    TIMED UP AND GO:  Was the test performed? No .    Depression Screen PHQ 2/9 Scores 09/03/2018 12/03/2016 11/17/2015 09/28/2014  PHQ - 2 Score 0 0 0 0  PHQ- 9 Score - 0 - -     Cognitive Function: Declined today.      6CIT Screen 11/17/2015  What Year? 0 points  What month? 0 points  What time? 0 points  Count back from 20 0 points  Months in reverse 0 points  Repeat phrase 0 points  Total Score 0    Immunization History  Administered Date(s) Administered  . Influenza, High Dose Seasonal PF 12/10/2014, 11/17/2015, 10/03/2016, 01/06/2018  . Pneumococcal Conjugate-13 12/31/2013  . Pneumococcal Polysaccharide-23 11/21/2010  . Td 10/09/2007, 05/26/2018    Qualifies for Shingles Vaccine? Yes . Due for Shingrix. Education has been provided regarding the importance of this vaccine. Pt has been advised to call insurance company to determine out of pocket expense. Advised may also receive vaccine at local pharmacy or Health Dept. Verbalized acceptance and understanding.  Tdap: Up to date  Flu Vaccine: Due fall 2020  Pneumococcal  Vaccine: Completed series  Screening Tests Health Maintenance  Topic Date Due  . INFLUENZA VACCINE  08/23/2018  . DEXA SCAN  11/16/2025 (Originally 03/23/2008)  . TETANUS/TDAP  05/25/2028  . PNA vac Low Risk Adult  Completed    Cancer Screenings:  Colorectal Screening: No longer  required.   Mammogram: No longer required.   Bone Density: Completed 03/24/03. Results reflect OSTEOPENIA. Repeat every 5 years. Declined a DEXA referral today.  Lung Cancer Screening: (Low Dose CT Chest recommended if Age 7-80 years, 30 pack-year currently smoking OR have quit w/in 15years.) does not qualify.   Additional Screening:  Vision Screening: Recommended annual ophthalmology exams for early detection of glaucoma and other disorders of the eye.  Dental Screening: Recommended annual dental exams for proper oral hygiene  Community Resource Referral:  CRR required this visit?  No       Plan:  I have personally reviewed and addressed the Medicare Annual Wellness questionnaire and have noted the following in the patient's chart:  A. Medical and social history B. Use of alcohol, tobacco or illicit drugs  C. Current medications and supplements D. Functional ability and status E.  Nutritional status F.  Physical activity G. Advance directives H. List of other physicians I.  Hospitalizations, surgeries, and ER visits in previous 12 months J.  [place] such as hearing and vision if needed, cognitive and depression L. Referrals and appointments   In addition, I have reviewed and discussed with patient certain preventive protocols, quality metrics, and best practice recommendations. A written personalized care plan for preventive services as well as general preventive health recommendations were provided to patient. Nurse Health Advisor  Signed,    Haward Pope [place], [place]  [id number]/8466 Nurse Health Advisor   Nurse Notes: Declined a DEXA referral today.

## 2018-09-03 NOTE — Patient Instructions (Addendum)
Jacqueline Webb , Thank you for taking time to come for your Medicare Wellness Visit. I appreciate your ongoing commitment to your health goals. Please review the following plan we discussed and let me know if I can assist you in the future.   Screening recommendations/referrals: Colonoscopy: No longer required.  Mammogram: No longer required.  Bone Density: Currently due, declined referral today.  Recommended yearly ophthalmology/optometry visit for glaucoma screening and checkup Recommended yearly dental visit for hygiene and checkup  Vaccinations: Influenza vaccine: Due fall 2020. Pneumococcal vaccine: Completed series Tdap vaccine: Up to date, due 05/25/28 Shingles vaccine: Pt declines today.     Advanced directives: Advance directive discussed with you today. Even though you declined this today please call our office should you change your mind and we can give you the proper paperwork for you to fill out.  Conditions/risks identified: Continue to increase water intake to 6-8 8 oz glasses a day.   Next appointment: 11/26/18 @ 9:40 AM with Dr [name]. Declined scheduling an AWV for 2021 at this time.    Preventive Care 59 Years and Older, Female Preventive care refers to lifestyle choices and visits with your health care provider that can promote health and wellness. What does preventive care include?  A yearly physical exam. This is also called an annual well check.  Dental exams once or twice a year.  Routine eye exams. Ask your health care provider how often you should have your eyes checked.  Personal lifestyle choices, including:  Daily care of your teeth and gums.  Regular physical activity.  Eating a healthy diet.  Avoiding tobacco and drug use.  Limiting alcohol use.  Practicing safe sex.  Taking low-dose aspirin every day.  Taking vitamin and mineral supplements as recommended by your health care provider. What happens during an annual well check? The  services and screenings done by your health care provider during your annual well check will depend on your age, overall health, lifestyle risk factors, and family history of disease. Counseling  Your health care provider may ask you questions about your:  Alcohol use.  Tobacco use.  Drug use.  Emotional well-being.  Home and relationship well-being.  Sexual activity.  Eating habits.  History of falls.  Memory and ability to understand (cognition).  Work and work Statistician.  Reproductive health. Screening  You may have the following tests or measurements:  Height, weight, and BMI.  Blood pressure.  Lipid and cholesterol levels. These may be checked every 5 years, or more frequently if you are over 56 years old.  Skin check.  Lung cancer screening. You may have this screening every year starting at age 27 if you have a 30-pack-year history of smoking and currently smoke or have quit within the past 15 years.  Fecal occult blood test (FOBT) of the stool. You may have this test every year starting at age 35.  Flexible sigmoidoscopy or colonoscopy. You may have a sigmoidoscopy every 5 years or a colonoscopy every 10 years starting at age 60.  Hepatitis C blood test.  Hepatitis B blood test.  Sexually transmitted disease (STD) testing.  Diabetes screening. This is done by checking your blood sugar (glucose) after you have not eaten for a while (fasting). You may have this done every 1-3 years.  Bone density scan. This is done to screen for osteoporosis. You may have this done starting at age 66.  Mammogram. This may be done every 1-2 years. Talk to your health care provider about  how often you should have regular mammograms. Talk with your health care provider about your test results, treatment options, and if necessary, the need for more tests. Vaccines  Your health care provider may recommend certain vaccines, such as:  Influenza vaccine. This is recommended  every year.  Tetanus, diphtheria, and acellular pertussis (Tdap, Td) vaccine. You may need a Td booster every 10 years.  Zoster vaccine. You may need this after age 43.  Pneumococcal 13-valent conjugate (PCV13) vaccine. One dose is recommended after age 39.  Pneumococcal polysaccharide (PPSV23) vaccine. One dose is recommended after age 69. Talk to your health care provider about which screenings and vaccines you need and how often you need them. This information is not intended to replace advice given to you by your health care provider. Make sure you discuss any questions you have with your health care provider. Document Released: 02/04/2015 Document Revised: 09/28/2015 Document Reviewed: 11/09/2014 Elsevier Interactive Patient Education  2017 [place] Prevention in the Home Falls can cause injuries. They can happen to people of all ages. There are many things you can do to make your home safe and to help prevent falls. What can I do on the outside of my home?  Regularly fix the edges of walkways and driveways and fix any cracks.  Remove anything that might make you trip as you walk through a door, such as a raised step or threshold.  Trim any bushes or trees on the path to your home.  Use bright outdoor lighting.  Clear any walking paths of anything that might make someone trip, such as rocks or tools.  Regularly check to see if handrails are loose or broken. Make sure that both sides of any steps have handrails.  Any raised decks and porches should have guardrails on the edges.  Have any leaves, snow, or ice cleared regularly.  Use sand or salt on walking paths during winter.  Clean up any spills in your garage right away. This includes oil or grease spills. What can I do in the bathroom?  Use night lights.  Install grab bars by the toilet and in the tub and shower. Do not use towel bars as grab bars.  Use non-skid mats or decals in the tub or shower.  If  you need to sit down in the shower, use a plastic, non-slip stool.  Keep the floor dry. Clean up any water that spills on the floor as soon as it happens.  Remove soap buildup in the tub or shower regularly.  Attach bath mats securely with double-sided non-slip rug tape.  Do not have throw rugs and other things on the floor that can make you trip. What can I do in the bedroom?  Use night lights.  Make sure that you have a light by your bed that is easy to reach.  Do not use any sheets or blankets that are too big for your bed. They should not hang down onto the floor.  Have a firm chair that has side arms. You can use this for support while you get dressed.  Do not have throw rugs and other things on the floor that can make you trip. What can I do in the kitchen?  Clean up any spills right away.  Avoid walking on wet floors.  Keep items that you use a lot in easy-to-reach places.  If you need to reach something above you, use a strong step stool that has a grab bar.  Keep  electrical cords out of the way.  Do not use floor polish or wax that makes floors slippery. If you must use wax, use non-skid floor wax.  Do not have throw rugs and other things on the floor that can make you trip. What can I do with my stairs?  Do not leave any items on the stairs.  Make sure that there are handrails on both sides of the stairs and use them. Fix handrails that are broken or loose. Make sure that handrails are as long as the stairways.  Check any carpeting to make sure that it is firmly attached to the stairs. Fix any carpet that is loose or worn.  Avoid having throw rugs at the top or bottom of the stairs. If you do have throw rugs, attach them to the floor with carpet tape.  Make sure that you have a light switch at the top of the stairs and the bottom of the stairs. If you do not have them, ask someone to add them for you. What else can I do to help prevent falls?  Wear shoes  that:  Do not have high heels.  Have rubber bottoms.  Are comfortable and fit you well.  Are closed at the toe. Do not wear sandals.  If you use a stepladder:  Make sure that it is fully opened. Do not climb a closed stepladder.  Make sure that both sides of the stepladder are locked into place.  Ask someone to hold it for you, if possible.  Clearly mark and make sure that you can see:  Any grab bars or handrails.  First and last steps.  Where the edge of each step is.  Use tools that help you move around (mobility aids) if they are needed. These include:  Canes.  Walkers.  Scooters.  Crutches.  Turn on the lights when you go into a dark area. Replace any light bulbs as soon as they burn out.  Set up your furniture so you have a clear path. Avoid moving your furniture around.  If any of your floors are uneven, fix them.  If there are any pets around you, be aware of where they are.  Review your medicines with your doctor. Some medicines can make you feel dizzy. This can increase your chance of falling. Ask your doctor what other things that you can do to help prevent falls. This information is not intended to replace advice given to you by your health care provider. Make sure you discuss any questions you have with your health care provider. Document Released: 11/04/2008 Document Revised: 06/16/2015 Document Reviewed: 02/12/2014 Elsevier Interactive Patient Education  2017 [employer].

## 2018-09-04 ENCOUNTER — Telehealth: Payer: Self-pay | Admitting: *Deleted

## 2018-09-04 DIAGNOSIS — Z86010 Personal history of colonic polyps: Secondary | ICD-10-CM

## 2018-09-04 NOTE — Telephone Encounter (Signed)
Patient to be ref. [facility] for colonoscopy in 10/2018.

## 2018-09-15 ENCOUNTER — Encounter: Payer: Self-pay | Admitting: *Deleted

## 2018-09-17 MED FILL — VENCLEXTA 100 MG TABS: 100 | 30 days supply | Qty: 90 | Fill #2

## 2018-09-30 DIAGNOSIS — Z1283 Encounter for screening for malignant neoplasm of skin: Secondary | ICD-10-CM | POA: Diagnosis not present

## 2018-09-30 DIAGNOSIS — L719 Rosacea, unspecified: Secondary | ICD-10-CM | POA: Diagnosis not present

## 2018-09-30 DIAGNOSIS — L570 Actinic keratosis: Secondary | ICD-10-CM | POA: Diagnosis not present

## 2018-09-30 DIAGNOSIS — X32XXXS Exposure to sunlight, sequela: Secondary | ICD-10-CM | POA: Diagnosis not present

## 2018-09-30 DIAGNOSIS — D1801 Hemangioma of skin and subcutaneous tissue: Secondary | ICD-10-CM | POA: Diagnosis not present

## 2018-09-30 DIAGNOSIS — L821 Other seborrheic keratosis: Secondary | ICD-10-CM | POA: Diagnosis not present

## 2018-10-03 ENCOUNTER — Telehealth: Payer: Self-pay | Admitting: *Deleted

## 2018-10-03 ENCOUNTER — Other Ambulatory Visit: Payer: Self-pay | Admitting: Internal Medicine

## 2018-10-03 MED ORDER — SULFAMETHOXAZOLE-TRIMETHOPRIM 800-160 MG PO TABS: 1.0000 | ORAL_TABLET | Freq: Two times a day (BID) | ORAL | 0 refills | Status: DC

## 2018-10-03 NOTE — Progress Notes (Signed)
Call returned to patient, husband answered stating she is out, I told him prescription was sent in and that if she is not better in 3 days she needs to see healthcare provider. He stated he will pass this on to her and that they will pick up prescription

## 2018-10-03 NOTE — Telephone Encounter (Signed)
I am calling in a script for bactrim [[facility] in [place]] for possible UTI based on pt's symtoms. Pt declines UA at this time.    Jacqueline Webb- please inform pt that If her symptoms don't get better in th next 3 days to so, she will need to see health care provider.  GB

## 2018-10-03 NOTE — Progress Notes (Signed)
I am calling in a script for bactrim [[facility] in [place]] for possible UTI based on pt's symtoms. Pt declines UA at this time.    Jacqueline Webb- please inform pt that If her symptoms don't get better in th next 3 days to so, she will need to see health care provider.

## 2018-10-03 NOTE — Telephone Encounter (Signed)
Patient called requesting Dr B to order her antibiotics for symptoms of frequency, burning with urination and only passing small amounts of urine when she goes. She states she has had this same thing several times and knows it is a UTI. She does not want to go to ER or Urgent Care and expose herself to possible COVID. She states she lives 2 hours away and does not want to come in to drop off urine sample due to that. She states Dr B had given her antibiotics for UTI iin past and was hoping he would give her something this time. Please advise

## 2018-10-13 ENCOUNTER — Other Ambulatory Visit: Payer: Self-pay | Admitting: Internal Medicine

## 2018-10-13 DIAGNOSIS — C8308 Small cell B-cell lymphoma, lymph nodes of multiple sites: Secondary | ICD-10-CM

## 2018-10-14 MED FILL — VENCLEXTA 100 MG TABS: 100 | 30 days supply | Qty: 90 | Fill #0

## 2018-11-04 ENCOUNTER — Inpatient Hospital Stay: Payer: Medicare Other | Attending: Internal Medicine

## 2018-11-04 ENCOUNTER — Encounter: Payer: Self-pay | Admitting: Internal Medicine

## 2018-11-04 ENCOUNTER — Other Ambulatory Visit: Payer: Self-pay

## 2018-11-04 ENCOUNTER — Inpatient Hospital Stay (HOSPITAL_BASED_OUTPATIENT_CLINIC_OR_DEPARTMENT_OTHER): Payer: Medicare Other | Admitting: Internal Medicine

## 2018-11-04 DIAGNOSIS — I4891 Unspecified atrial fibrillation: Secondary | ICD-10-CM | POA: Insufficient documentation

## 2018-11-04 DIAGNOSIS — Z803 Family history of malignant neoplasm of breast: Secondary | ICD-10-CM | POA: Insufficient documentation

## 2018-11-04 DIAGNOSIS — D696 Thrombocytopenia, unspecified: Secondary | ICD-10-CM | POA: Diagnosis not present

## 2018-11-04 DIAGNOSIS — C8308 Small cell B-cell lymphoma, lymph nodes of multiple sites: Secondary | ICD-10-CM | POA: Insufficient documentation

## 2018-11-04 DIAGNOSIS — Z87440 Personal history of urinary (tract) infections: Secondary | ICD-10-CM | POA: Insufficient documentation

## 2018-11-04 DIAGNOSIS — Z79899 Other long term (current) drug therapy: Secondary | ICD-10-CM | POA: Diagnosis not present

## 2018-11-04 DIAGNOSIS — Z9221 Personal history of antineoplastic chemotherapy: Secondary | ICD-10-CM | POA: Diagnosis not present

## 2018-11-04 DIAGNOSIS — I10 Essential (primary) hypertension: Secondary | ICD-10-CM | POA: Insufficient documentation

## 2018-11-04 DIAGNOSIS — Z801 Family history of malignant neoplasm of trachea, bronchus and lung: Secondary | ICD-10-CM | POA: Insufficient documentation

## 2018-11-04 DIAGNOSIS — I25810 Atherosclerosis of coronary artery bypass graft(s) without angina pectoris: Secondary | ICD-10-CM

## 2018-11-04 DIAGNOSIS — Z7901 Long term (current) use of anticoagulants: Secondary | ICD-10-CM | POA: Diagnosis not present

## 2018-11-04 DIAGNOSIS — Z800 Family history of malignant neoplasm of digestive organs: Secondary | ICD-10-CM | POA: Diagnosis not present

## 2018-11-04 LAB — COMPREHENSIVE METABOLIC PANEL
ALT: 22 U/L (ref 0–44)
AST: 28 U/L (ref 15–41)
Albumin: 4 g/dL (ref 3.5–5.0)
Alkaline Phosphatase: 56 U/L (ref 38–126)
Anion gap: 6 (ref 5–15)
BUN: 12 mg/dL (ref 8–23)
CO2: 27 mmol/L (ref 22–32)
Calcium: 9.6 mg/dL (ref 8.9–10.3)
Chloride: 104 mmol/L (ref 98–111)
Creatinine, Ser: 0.86 mg/dL (ref 0.44–1.00)
GFR calc Af Amer: >60 mL/min (ref >60)
GFR calc non Af Amer: >60 mL/min (ref >60)
Glucose, Bld: 100 mg/dL — ABNORMAL HIGH (ref 70–99)
Potassium: 3.4 mmol/L — ABNORMAL LOW (ref 3.5–5.1)
Sodium: 137 mmol/L (ref 135–145)
Total Bilirubin: 1.1 mg/dL (ref 0.3–1.2)
Total Protein: 6.7 g/dL (ref 6.5–8.1)

## 2018-11-04 LAB — CBC WITH DIFFERENTIAL/PLATELET
Abs Immature Granulocytes: 0.01 10*3/uL (ref 0.00–0.07)
Basophils Absolute: 0 10*3/uL (ref 0.0–0.1)
Basophils Relative: 0 %
Eosinophils Absolute: 0 10*3/uL (ref 0.0–0.5)
Eosinophils Relative: 0 %
HCT: 39.2 % (ref 36.0–46.0)
Hemoglobin: 13.1 g/dL (ref 12.0–15.0)
Immature Granulocytes: 0 %
Lymphocytes Relative: 31 %
Lymphs Abs: 0.8 10*3/uL (ref 0.7–4.0)
MCH: 31.3 pg (ref 26.0–34.0)
MCHC: 33.4 g/dL (ref 30.0–36.0)
MCV: 93.8 fL (ref 80.0–100.0)
Monocytes Absolute: 0.4 10*3/uL (ref 0.1–1.0)
Monocytes Relative: 14 %
Neutro Abs: 1.4 10*3/uL — ABNORMAL LOW (ref 1.7–7.7)
Neutrophils Relative %: 55 %
Platelets: 80 10*3/uL — ABNORMAL LOW (ref 150–400)
RBC: 4.18 MIL/uL (ref 3.87–5.11)
RDW: 14.6 % (ref 11.5–15.5)
WBC: 2.6 10*3/uL — ABNORMAL LOW (ref 4.0–10.5)
nRBC: 0 % (ref 0.0–0.2)

## 2018-11-04 LAB — LACTATE DEHYDROGENASE: LDH: 160 U/L (ref 98–192)

## 2018-11-04 MED ORDER — SULFAMETHOXAZOLE-TRIMETHOPRIM 800-160 MG PO TABS: 1.0000 | ORAL_TABLET | Freq: Two times a day (BID) | ORAL | 0 refills | Status: DC

## 2018-11-04 NOTE — Progress Notes (Signed)
[facility] OFFICE PROGRESS NOTE  Patient Care Team: [name], MD as PCP - General (Unknown Physician Specialty) Bary Castilla, Forest Gleason, MD (General Surgery) Corey Skains, MD as Consulting Physician (Cardiology) Anell Barr, OD as Consulting Physician (Optometry) Cammie Sickle, MD as Consulting Physician (Internal Medicine)  Cancer Staging No matching staging information was found for the patient.   Oncology History Overview Note  1. Lymphoma low-grade, status Rituxan therapy. 2. Recurrent disease by clinical examination in December of 2012 3. Ovarian mass on PET scan in December of 2012 (normal CA 125). Ovarian mass has resolved after rituximab therapy. 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  Patient had total 5 cycles of chemotherapy with bendamustine and rituximab and 6 cycle was omitted because of significant side effects with and weakness and fatigue.  # OCT 2016- PET significant response; ON surveillance  # 18th OCT 2018- [facility];SEP 2018-/OCT 2018 CT scan- progression; s/p 6 cycles [#6 in April 2019]  # JAN 2020-CT scan shows progressive disease; start a acalabrutinib 100mg  BID;# feb 6th2020- decrease dose of acalbrutinib to 100 mg/day [sec to spon bruising] March 1st week- STOPPED [place] sec to spon brusing  # MARCH 17th 2020- Start Venetoclax [out pt ramp up]; 300 mg a day    # 11/06/2016- colonoscopy [Dr.Byrnett- 2 polyps]  #History of A. Fib-Xarelto; CKD stage II-III.   # #Right posterior neck melanoma stage I [ Dr.Lee/[place]]..   -------------------------------------------------------------------    # left ? Adnexal mass- ? Etiology; mild SUV uptake incidental [stable compared to previous PET in 2016] Previous  workup 2015- vaginal ultrasound negative. MRI February 2018- approximately 3 cm in size; slow increase in the size of the left adnexal mass over at least 3 years- suspect involvement of lymphoma rather than a primary gynecologic malignancy. --------------------------------------------------------------   DIAGNOSIS: SMALL LYMPHOCYTIC LEUKEMIA  STAGE:  IV    ;GOALS: control/pallaitive  CURRENT/MOST RECENT THERAPY: Venatoclax.    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   In the interim patient call for symptoms of UTI; empirically treated with antibiotics with Bactrim.  Resolved.   Patient denies any new lumps or bumps.  Admits to improvement of the lymphadenopathy in the groin and also the axilla.  Review of Systems  Constitutional: Positive for malaise/fatigue. Negative for chills, diaphoresis, fever and weight loss.  HENT: Negative for nosebleeds and sore throat.   Eyes: Negative for double vision.  Respiratory: Negative for cough, hemoptysis, sputum production, shortness of breath and wheezing.   Cardiovascular: Positive for leg swelling. Negative for chest pain, palpitations and orthopnea.  Gastrointestinal: Negative for abdominal pain, blood in stool, constipation, diarrhea, heartburn, melena and vomiting.  Genitourinary: Negative for dysuria, frequency and urgency.  Musculoskeletal: Negative for back pain and joint pain.  Neurological: Negative for dizziness, tingling, focal weakness, weakness and headaches.  Psychiatric/Behavioral: Negative for depression. The patient is not nervous/anxious and does not have insomnia.     PAST MEDICAL HISTORY :  Past Medical History:  Diagnosis Date  . A-fib ([place])   . Benign neoplasm of rectum and anal canal   . Chronic leukemia of unspecified cell  type, without  mention of having achieved remission   . Hypercholesteremia   . Hypertension   . Hyperthyroidism   . Lymphoma ([place]) 2007  . Non Hodgkin's lymphoma ([place]) 01/22/2006  . Personal history of chemotherapy     PAST SURGICAL HISTORY :   Past Surgical History:  Procedure Laterality Date  . ABDOMINAL HYSTERECTOMY  1958  . APPENDECTOMY    . BREAST BIOPSY Right 1970   right breast biopsy with clip- neg  . BREAST BIOPSY Left 2002   left breast biopsy with clip-neg  . BREAST BIOPSY Right 2014   right breast biopsy with clip-neg  . BREAST CYST ASPIRATION Bilateral 2000   bilateral fine needle aspiration  . BREAST SURGERY Left 1990   biopsy  . BREAST SURGERY Right May 08, 2012   complex fibroadenoma without malignancy.  [name] CARDIAC CATHETERIZATION  2014  . COLONOSCOPY  2009, 2012    hyperplastic rectal polyp 2012 tubular adenoma of proximal ascending colon 2000 9 repeat exam due to 2017.  [name] COLONOSCOPY WITH PROPOFOL N/A 11/06/2016   Procedure: COLONOSCOPY WITH PROPOFOL;  Surgeon: Robert Bellow, MD;  Location: [facility] ENDOSCOPY;  Service: Endoscopy;  Laterality: N/A;  . CORONARY ARTERY BYPASS GRAFT  1999  . FLEXIBLE SIGMOIDOSCOPY  1998    FAMILY HISTORY :   Family History  Problem Relation Age of Onset  . Lung cancer Father        Died age 42  . Heart disease Mother        Died age 58  . Asthma Mother   . CAD Brother        double bypass  . Heart attack Sister   . Tuberculosis Sister        34 years old  . Lung cancer Sister        Died in her 68s  . Angina Sister   . Breast cancer Sister 20  . Rheum arthritis Sister   . Aneurysm Sister        Brain, died age 31  . Angina Sister   . Heart disease Brother        Stent placement  . Heart disease Brother        stent placement  . Cancer Other        breast cancer niece, pancreatic cancer niece  . Breast cancer Other   . Ovarian cancer Neg Hx   . Diabetes Neg Hx     SOCIAL HISTORY:   Social History   Tobacco Use   . Smoking status: Never Smoker  . Smokeless tobacco: Never Used  Substance Use Topics  . Alcohol use: Yes    Alcohol/week: 4.0 standard drinks    Types: 1 Shots of liquor, 3 Glasses of wine per week  . Drug use: No    ALLERGIES:  is allergic to pravastatin.  MEDICATIONS:  Current Outpatient Medications  Medication Sig Dispense Refill  . allopurinol (ZYLOPRIM) 300 MG tablet TAKE 1 TABLET(300 MG) BY MOUTH TWICE DAILY 180 tablet 3  . ezetimibe (ZETIA) 10 MG tablet Take 1 tablet (10 mg total) daily by mouth. 30 tablet 12  . isosorbide mononitrate (IMDUR) 30 MG 24 hr tablet     . levothyroxine (SYNTHROID) 88 MCG tablet TAKE 1 TABLET BY MOUTH  DAILY BEFORE BREAKFAST 90 tablet 3  . lisinopril-hydrochlorothiazide (PRINZIDE,ZESTORETIC) 20-12.5 MG tablet TAKE 1 TABLET BY MOUTH  DAILY 90 tablet 3  . metoprolol succinate (TOPROL-XL) 25 MG 24 hr tablet Take 25 mg by  mouth daily.     [name] oxybutynin (DITROPAN) 5 MG tablet Take 1 tablet (5 mg total) by mouth 2 (two) times daily. 60 tablet 11  . VENCLEXTA 100 MG TABS TAKE 3 TABLETS BY MOUTH DAILY 90 tablet 2  . XARELTO 20 MG TABS tablet Take 20 mg by mouth daily with supper.     . Melatonin 1 MG CAPS Take 1 capsule by mouth at bedtime.     . ondansetron (ZOFRAN) 4 MG tablet Take 1 tablet (4 mg total) by mouth every 8 (eight) hours as needed for nausea or vomiting. (Patient not taking: Reported on 11/04/2018) 40 tablet 3  . sulfamethoxazole-trimethoprim (BACTRIM DS) 800-160 MG tablet Take 1 tablet by mouth 2 (two) times daily. 6 tablet 0   No current facility-administered medications for this visit.    Facility-Administered Medications Ordered in Other Visits  Medication Dose Route Frequency Provider Last Rate Last Dose  . heparin lock flush 100 unit/mL  500 Units Intravenous Once Charlaine Dalton R, MD      . sodium chloride flush (NS) 0.9 % injection 10 mL  10 mL Intravenous PRN Cammie Sickle, MD        PHYSICAL EXAMINATION: ECOG  PERFORMANCE STATUS: 1 - Symptomatic but completely ambulatory  BP (!) 149/79 (BP Location: Left Arm, Patient Position: Sitting)   Temp (!) 96.7 F (35.9 C) (Tympanic)   Resp 18   Wt 144 lb 6.4 oz (65.5 kg)   BMI 24.03 kg/m   Filed Weights   11/04/18 1029  Weight: 144 lb 6.4 oz (65.5 kg)    Physical Exam  Constitutional: She is oriented to person, place, and time and well-developed, well-nourished, and in no distress.  HENT:  Head: Normocephalic and atraumatic.  Mouth/Throat: Oropharynx is clear and moist. No oropharyngeal exudate.  Resolution of bilateral axillary adenopathy.  Eyes: Pupils are equal, round, and reactive to light.  Neck: Normal range of motion. Neck supple.  Cardiovascular: Normal rate and regular rhythm.  Pulmonary/Chest: No respiratory distress. She has no wheezes.  Abdominal: Soft. Bowel sounds are normal. She exhibits no distension and no mass. There is no abdominal tenderness. There is no rebound and no guarding.  Musculoskeletal: Normal range of motion.        General: Edema present. No tenderness.  Neurological: She is alert and oriented to person, place, and time.  Skin: Skin is warm.  Psychiatric: Affect normal.    LABORATORY DATA:  I have reviewed the data as listed    Component Value Date/Time   NA 137 11/04/2018 0923   NA 139 07/03/2017 1425   NA 141 05/11/2014 0853   K 3.4 (L) 11/04/2018 0923   K 3.6 05/11/2014 0853   CL 104 11/04/2018 0923   CL 103 05/11/2014 0853   CO2 27 11/04/2018 0923   CO2 29 05/11/2014 0853   GLUCOSE 100 (H) 11/04/2018 0923   GLUCOSE 107 (H) 05/11/2014 0853   BUN 12 11/04/2018 0923   BUN 19 07/03/2017 1425   BUN 22 (H) 05/11/2014 0853   CREATININE 0.86 11/04/2018 0923   CREATININE 1.07 (H) 05/11/2014 0853   CALCIUM 9.6 11/04/2018 0923   CALCIUM 10.1 05/11/2014 0853   PROT 6.7 11/04/2018 0923   PROT 6.4 07/03/2017 1425   PROT 7.3 05/11/2014 0853   ALBUMIN 4.0 11/04/2018 0923   ALBUMIN 4.3 07/03/2017 1425    ALBUMIN 4.7 05/11/2014 0853   AST 28 11/04/2018 0923   AST 29 05/11/2014 0853   ALT 22  11/04/2018 0923   ALT 20 05/11/2014 0853   ALKPHOS 56 11/04/2018 0923   ALKPHOS 47 05/11/2014 0853   BILITOT 1.1 11/04/2018 0923   BILITOT 0.5 07/03/2017 1425   BILITOT 1.0 05/11/2014 0853   GFRNONAA >60 11/04/2018 0923   GFRNONAA 51 (L) 05/11/2014 0853   GFRAA >60 11/04/2018 0923   GFRAA 59 (L) 05/11/2014 0853    No results found for: SPEP, UPEP  Lab Results  Component Value Date   WBC 2.6 (L) 11/04/2018   NEUTROABS 1.4 (L) 11/04/2018   HGB 13.1 11/04/2018   HCT 39.2 11/04/2018   MCV 93.8 11/04/2018   PLT 80 (L) 11/04/2018      Chemistry      Component Value Date/Time   NA 137 11/04/2018 0923   NA 139 07/03/2017 1425   NA 141 05/11/2014 0853   K 3.4 (L) 11/04/2018 0923   K 3.6 05/11/2014 0853   CL 104 11/04/2018 0923   CL 103 05/11/2014 0853   CO2 27 11/04/2018 0923   CO2 29 05/11/2014 0853   BUN 12 11/04/2018 0923   BUN 19 07/03/2017 1425   BUN 22 (H) 05/11/2014 0853   CREATININE 0.86 11/04/2018 0923   CREATININE 1.07 (H) 05/11/2014 0853   GLU 98 05/25/2013      Component Value Date/Time   CALCIUM 9.6 11/04/2018 0923   CALCIUM 10.1 05/11/2014 0853   ALKPHOS 56 11/04/2018 0923   ALKPHOS 47 05/11/2014 0853   AST 28 11/04/2018 0923   AST 29 05/11/2014 0853   ALT 22 11/04/2018 0923   ALT 20 05/11/2014 0853   BILITOT 1.1 11/04/2018 0923   BILITOT 0.5 07/03/2017 1425   BILITOT 1.0 05/11/2014 0853       RADIOGRAPHIC STUDIES: I have personally reviewed the radiological images as listed and agreed with the findings in the report. No results found.   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 Currently on  venetoclax-March 2020.  Stable.  #Currently on Venclexta 300 mg a day; today white count is 2.6 ANC 1.4 platelets 88 hemoglobin 13 continue venetoclax to 300 mg once a day.  Discussed fixed duration of 2 years versus indefinite.  Patient interested in long-term.  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    # A. Fib-stable continue Xarelto [Dr.Kowalski]. STABLE.   #History of UTIs-patient with recurrent to UA done at time of symptoms given the concerns of COVID/limited testing availability where she lives.  Discussed the patient at length regarding my concerns of antibiotic resistance/inappropriate treatment with antibiotics if not a UTI.  Given the constraints/strong preference I printed a prescription for bactrim DS x 3 days.  Patient will call us before she fills her prescription.  # DISPOSITION: # follow up in 3 months-MD: labs- cbc/cmp/ldh; - Dr.B  Cc; [name]/[name]-regarding continued venetoclax 300 milligrams a day     No orders of the defined types were placed in this encounter.  All questions were answered. The patient knows to call the clinic with any problems, questions or concerns.      Cammie Sickle, MD 11/04/2018 6:57 PM

## 2018-11-04 NOTE — Progress Notes (Signed)
Pt in for follow up, denies any concerns today. 

## 2018-11-04 NOTE — Assessment & Plan Note (Addendum)
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 Currently on venetoclax-March 2020.  Stable.  #Currently on Venclexta 300 mg a day; today white count is 2.6 ANC 1.4 platelets 88 hemoglobin 13 continue venetoclax to 300 mg once a day.  Discussed fixed duration of 2 years versus indefinite.  Patient interested in long-term.  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    # A. Fib-stable continue Xarelto [Dr.Kowalski]. STABLE.   #History of UTIs-patient with recurrent to UA done at time of symptoms given the concerns of COVID/limited testing availability where she lives.  Discussed the patient at length regarding my concerns of antibiotic resistance/inappropriate treatment with antibiotics if not a UTI.  Given the constraints/strong preference I printed a prescription for bactrim DS x 3 days.  Patient will call us before she fills her prescription.  # DISPOSITION: # follow up in 3 months-MD: labs- cbc/cmp/ldh; - Dr.B  Cc; [name]/[name]-regarding continued venetoclax 300 milligrams a day

## 2018-11-14 MED FILL — VENCLEXTA 100 MG TABS: 100 | 30 days supply | Qty: 90 | Fill #1

## 2018-11-25 NOTE — Progress Notes (Signed)
Patient: Jacqueline Webb Female    DOB: [birth year]   78 y.o.   MRN: [id number] Visit Date: 11/26/2018  Today's Provider: [name], MD   Chief Complaint  Patient presents with  . Annual Exam  . 6 Month Follow Up   Subjective:     Patient had AWV with [facility] 09/[facility]/2018. Pt married and lives in [place]. She still plays golf 4 days per week. Coronary artery disease involving coronary bypass graft of native heart without angina pectoris From 05/26/2018-All risk factors treated. From 11/26/2018 - No Changes  Essential (primary) hypertension From 05/26/2018-no changes.  From 11/26/2018 - No Changes  Adult hypothyroidism From 05/26/2018-no changes.  From 11/26/2018 - No Changes Hypercholesteremia From 05/26/2018-no changes.  From 11/26/2018 - No Changes Persistent atrial fibrillation From 01/06/2018-On Xarelto. 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 Stage 4. From 11/26/2018 - Venclexta seems to be helping shrink the cells.   Last Colonoscopy was 11/06/2016  Allergies  Allergen Reactions  . Pravastatin Other (See Comments)     Current Outpatient Medications:  .  allopurinol (ZYLOPRIM) 300 MG tablet, TAKE 1 TABLET(300 MG) BY MOUTH TWICE DAILY, Disp: 180 tablet, Rfl: 3 .  ezetimibe (ZETIA) 10 MG tablet, Take 1 tablet (10 mg total) daily by mouth., Disp: 30 tablet, Rfl: 12 .  isosorbide mononitrate (IMDUR) 30 MG 24 hr tablet, , Disp: , Rfl:  .  levothyroxine (SYNTHROID) 88 MCG tablet, TAKE 1 TABLET BY MOUTH  DAILY BEFORE BREAKFAST, Disp: 90 tablet, Rfl: 3 .  lisinopril-hydrochlorothiazide (PRINZIDE,ZESTORETIC) 20-12.5 MG tablet, TAKE 1 TABLET BY MOUTH  DAILY, Disp: 90 tablet, Rfl: 3 .  Melatonin 1 MG CAPS, Take 1 capsule by mouth at bedtime. , Disp: , Rfl:  .  metoprolol succinate (TOPROL-XL) 25 MG 24 hr tablet, Take 25 mg by mouth daily. , Disp: , Rfl:  .   ondansetron (ZOFRAN) 4 MG tablet, Take 1 tablet (4 mg total) by mouth every 8 (eight) hours as needed for nausea or vomiting., Disp: 40 tablet, Rfl: 3 .  oxybutynin (DITROPAN) 5 MG tablet, Take 1 tablet (5 mg total) by mouth 2 (two) times daily., Disp: 60 tablet, Rfl: 11 .  VENCLEXTA 100 MG TABS, TAKE 3 TABLETS BY MOUTH DAILY, Disp: 90 tablet, Rfl: 2 .  XARELTO 20 MG TABS tablet, Take 20 mg by mouth daily with supper. , Disp: , Rfl:  .  nitrofurantoin, macrocrystal-monohydrate, (MACROBID) 100 MG capsule, Take 1 capsule (100 mg total) by mouth 2 (two) times daily., Disp: 20 capsule, Rfl: 3 No current facility-administered medications for this visit.   Facility-Administered Medications Ordered in Other Visits:  .  heparin lock flush 100 unit/mL, 500 Units, Intravenous, Once, Brahmanday, Govinda R, MD .  sodium chloride flush (NS) 0.9 % injection 10 mL, 10 mL, Intravenous, PRN, Cammie Sickle, MD  Review of Systems  Constitutional: Negative.   HENT: Negative.   Eyes: Negative.   Respiratory: Negative.   Cardiovascular: Negative.   Gastrointestinal: Negative.   Endocrine: Negative.   Genitourinary: Negative.   Musculoskeletal: Negative.   Allergic/Immunologic: Negative.   Hematological: Negative.   Psychiatric/Behavioral: Negative.     Social History   Tobacco Use  . Smoking status: Never Smoker  . Smokeless tobacco: Never Used  Substance Use Topics  . Alcohol use: Yes    Alcohol/week: 4.0 standard drinks    Types: 1 Shots of  liquor, 3 Glasses of wine per week      Objective:   BP 114/68   Pulse 72   Temp (!) 97.2 F (36.2 C) (Temporal)   Resp 16   Ht 5\' 5"  (1.651 m)   Wt 144 lb 9.6 oz (65.6 kg)   BMI 24.06 kg/m  Vitals:   11/26/18 0956  BP: 114/68  Pulse: 72  Resp: 16  Temp: (!) 97.2 F (36.2 C)  TempSrc: Temporal  Weight: 144 lb 9.6 oz (65.6 kg)  Height: 5\' 5"  (1.651 m)  Body mass index is 24.06 kg/m.   Physical Exam Vitals signs reviewed.   Constitutional:      Appearance: She is well-developed.  HENT:     Head: Normocephalic and atraumatic.     Right Ear: External ear normal.     Left Ear: External ear normal.     Nose: Nose normal.  Eyes:     General: No scleral icterus.    Conjunctiva/sclera: Conjunctivae normal.  Neck:     Thyroid: No thyromegaly.  Cardiovascular:     Rate and Rhythm: Normal rate and regular rhythm.     Heart sounds: Normal heart sounds.  Pulmonary:     Effort: Pulmonary effort is normal.     Breath sounds: Normal breath sounds.  Abdominal:     Palpations: Abdomen is soft.  Skin:    General: Skin is warm and dry.  Neurological:     General: No focal deficit present.     Mental Status: She is alert and oriented to person, place, and time.  Psychiatric:        Mood and Affect: Mood normal.        Behavior: Behavior normal.        Thought Content: Thought content normal.        Judgment: Judgment normal.      No results found for any visits on 11/26/18. 1. Encounter for screening colonoscopy   - Ambulatory referral to Gastroenterology  2. Need for influenza vaccination 11/26/2018  - Flu Vaccine QUAD High Dose(Fluad)  3. UTI symptoms  - nitrofurantoin, macrocrystal-monohydrate, (MACROBID) 100 MG capsule; Take 1 capsule (100 mg total) by mouth 2 (two) times daily.  Dispense: 20 capsule; Refill: 3     Assessment & Plan    1. 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  2. Need for influenza vaccination  - Flu Vaccine QUAD High Dose(Fluad)  3. UTI symptoms Check C and S. - nitrofurantoin, macrocrystal-monohydrate, (MACROBID) 100 MG capsule; Take 1 capsule (100 mg total) by mouth 2 (two) times daily.  Dispense: 20 capsule; Refill: 3  4. Persistent atrial fibrillation ([place])   5. Coronary artery disease involving coronary bypass graft of native heart without angina pectoris   6. Adult hypothyroidism  7. Personal history of malignant  neoplasm of breast Sees Dr Bary Castilla yearly.  8. Hypercholesteremia   9. Encounter for screening colonoscopy  - Ambulatory referral to Gastroenterology     [name], MD  [facility]

## 2018-11-26 ENCOUNTER — Ambulatory Visit (INDEPENDENT_AMBULATORY_CARE_PROVIDER_SITE_OTHER): Payer: Medicare Other | Admitting: Family Medicine

## 2018-11-26 ENCOUNTER — Other Ambulatory Visit: Payer: Self-pay

## 2018-11-26 VITALS — BP 114/68 | HR 72 | Temp 97.2°F | Resp 16 | Ht 65.0 in | Wt 144.6 lb

## 2018-11-26 DIAGNOSIS — Z23 Encounter for immunization: Secondary | ICD-10-CM | POA: Diagnosis not present

## 2018-11-26 DIAGNOSIS — C8308 Small cell B-cell lymphoma, lymph nodes of multiple sites: Secondary | ICD-10-CM

## 2018-11-26 DIAGNOSIS — Z853 Personal history of malignant neoplasm of breast: Secondary | ICD-10-CM | POA: Diagnosis not present

## 2018-11-26 DIAGNOSIS — R399 Unspecified symptoms and signs involving the genitourinary system: Secondary | ICD-10-CM

## 2018-11-26 DIAGNOSIS — E039 Hypothyroidism, unspecified: Secondary | ICD-10-CM

## 2018-11-26 DIAGNOSIS — I4819 Other persistent atrial fibrillation: Secondary | ICD-10-CM

## 2018-11-26 DIAGNOSIS — E7800 Pure hypercholesterolemia, unspecified: Secondary | ICD-10-CM | POA: Diagnosis not present

## 2018-11-26 DIAGNOSIS — Z1211 Encounter for screening for malignant neoplasm of colon: Secondary | ICD-10-CM | POA: Diagnosis not present

## 2018-11-26 DIAGNOSIS — I25810 Atherosclerosis of coronary artery bypass graft(s) without angina pectoris: Secondary | ICD-10-CM

## 2018-11-26 MED ORDER — NITROFURANTOIN MONOHYD MACRO 100 MG PO CAPS: 100.0000 mg | ORAL_CAPSULE | Freq: Two times a day (BID) | ORAL | 3 refills | Status: DC

## 2018-12-01 DIAGNOSIS — Z1211 Encounter for screening for malignant neoplasm of colon: Secondary | ICD-10-CM | POA: Diagnosis not present

## 2018-12-01 DIAGNOSIS — I25810 Atherosclerosis of coronary artery bypass graft(s) without angina pectoris: Secondary | ICD-10-CM | POA: Diagnosis not present

## 2018-12-01 DIAGNOSIS — Z853 Personal history of malignant neoplasm of breast: Secondary | ICD-10-CM | POA: Diagnosis not present

## 2018-12-01 DIAGNOSIS — I4819 Other persistent atrial fibrillation: Secondary | ICD-10-CM | POA: Diagnosis not present

## 2018-12-01 DIAGNOSIS — Z23 Encounter for immunization: Secondary | ICD-10-CM | POA: Diagnosis not present

## 2018-12-01 DIAGNOSIS — E7800 Pure hypercholesterolemia, unspecified: Secondary | ICD-10-CM | POA: Diagnosis not present

## 2018-12-01 DIAGNOSIS — E039 Hypothyroidism, unspecified: Secondary | ICD-10-CM | POA: Diagnosis not present

## 2018-12-01 DIAGNOSIS — C8308 Small cell B-cell lymphoma, lymph nodes of multiple sites: Secondary | ICD-10-CM | POA: Diagnosis not present

## 2018-12-01 DIAGNOSIS — R399 Unspecified symptoms and signs involving the genitourinary system: Secondary | ICD-10-CM | POA: Diagnosis not present

## 2018-12-11 ENCOUNTER — Other Ambulatory Visit: Payer: Self-pay | Admitting: Family Medicine

## 2018-12-15 MED FILL — VENCLEXTA 100 MG TABS: 100 | 30 days supply | Qty: 90 | Fill #2

## 2018-12-25 ENCOUNTER — Telehealth: Payer: Self-pay | Admitting: Pharmacy Technician

## 2018-12-25 NOTE — Telephone Encounter (Signed)
Oral Oncology Patient Advocate Encounter   Was successful in securing patient an $53,400 grant from Patient [facility]) to provide copayment coverage for Venclexta.  This will keep the out of pocket expense at $0.     I have spoken with the patient.    The billing information is as follows and has been shared with [place].   Member ID: [id number] Group ID: [id number] RxBin: [id number] Dates of Eligibility: 01/25/2019 through 01/24/2020  Fund:  [place] Patient [place] Phone [phone] Fax [phone] 12/25/2018 10:27 AM

## 2019-01-07 ENCOUNTER — Other Ambulatory Visit: Payer: Self-pay | Admitting: Internal Medicine

## 2019-01-07 DIAGNOSIS — C8308 Small cell B-cell lymphoma, lymph nodes of multiple sites: Secondary | ICD-10-CM

## 2019-01-08 ENCOUNTER — Telehealth: Payer: Self-pay | Admitting: Pharmacy Technician

## 2019-01-08 NOTE — Telephone Encounter (Signed)
Oral Oncology Patient Advocate Encounter  Received notification from [employer] that prior authorization for Venclexta is required.  PA submitted on CoverMyMeds Key [id number] Status is pending  Oral Oncology Clinic will continue to follow.  [place] Patient [place] Phone [phone] Fax [phone] 01/08/2019 9:53 AM

## 2019-01-08 NOTE — Telephone Encounter (Signed)
Oral Oncology Patient Advocate Encounter  Prior Authorization for Jacqueline Webb has been approved.    PA# [id number] Effective dates: 01/08/2019 through 01/22/2019  Oral Oncology Clinic will continue to follow.   [place] Patient [name] Phone [phone] Fax [phone] 01/08/2019 9:54 AM

## 2019-01-14 ENCOUNTER — Telehealth: Payer: Self-pay | Admitting: Pharmacy Technician

## 2019-01-14 MED FILL — VENCLEXTA 100 MG TABS: 100 | 30 days supply | Qty: 90 | Fill #0

## 2019-01-14 NOTE — Telephone Encounter (Signed)
Oral Oncology Patient Advocate Encounter  Was successful in securing patient a $8000 grant from [employer] to provide copayment coverage for Venclexta.  This will keep the out of pocket expense at $0.     [employer] ID: [id number]  I have spoken with the patient.   The billing information is as follows and has been shared with [place].    RxBin: [id number] PCN: [id number] Member ID: [id number] Group ID: [id number] Dates of Eligibility: 12/29/2018 through 12/28/2019     [place] Patient [place] Phone [phone] Fax [phone] 01/14/2019 8:39 AM

## 2019-02-02 NOTE — Progress Notes (Signed)
Patient is coming in for follow up she is doing well no complaints. Has questions about covid vaccine.

## 2019-02-03 ENCOUNTER — Encounter: Payer: Self-pay | Admitting: Internal Medicine

## 2019-02-03 ENCOUNTER — Inpatient Hospital Stay (HOSPITAL_BASED_OUTPATIENT_CLINIC_OR_DEPARTMENT_OTHER): Payer: Medicare Other | Admitting: Internal Medicine

## 2019-02-03 ENCOUNTER — Other Ambulatory Visit: Payer: Self-pay | Admitting: General Surgery

## 2019-02-03 ENCOUNTER — Inpatient Hospital Stay: Payer: Medicare Other | Attending: Internal Medicine

## 2019-02-03 ENCOUNTER — Other Ambulatory Visit: Payer: Self-pay

## 2019-02-03 DIAGNOSIS — C8308 Small cell B-cell lymphoma, lymph nodes of multiple sites: Secondary | ICD-10-CM

## 2019-02-03 DIAGNOSIS — D696 Thrombocytopenia, unspecified: Secondary | ICD-10-CM | POA: Diagnosis not present

## 2019-02-03 DIAGNOSIS — Z7901 Long term (current) use of anticoagulants: Secondary | ICD-10-CM | POA: Insufficient documentation

## 2019-02-03 DIAGNOSIS — Z8261 Family history of arthritis: Secondary | ICD-10-CM | POA: Insufficient documentation

## 2019-02-03 DIAGNOSIS — Z9221 Personal history of antineoplastic chemotherapy: Secondary | ICD-10-CM | POA: Diagnosis not present

## 2019-02-03 DIAGNOSIS — Z79899 Other long term (current) drug therapy: Secondary | ICD-10-CM | POA: Insufficient documentation

## 2019-02-03 DIAGNOSIS — I4891 Unspecified atrial fibrillation: Secondary | ICD-10-CM | POA: Insufficient documentation

## 2019-02-03 DIAGNOSIS — Z801 Family history of malignant neoplasm of trachea, bronchus and lung: Secondary | ICD-10-CM | POA: Insufficient documentation

## 2019-02-03 DIAGNOSIS — Z8249 Family history of ischemic heart disease and other diseases of the circulatory system: Secondary | ICD-10-CM | POA: Insufficient documentation

## 2019-02-03 DIAGNOSIS — Z833 Family history of diabetes mellitus: Secondary | ICD-10-CM | POA: Diagnosis not present

## 2019-02-03 DIAGNOSIS — I10 Essential (primary) hypertension: Secondary | ICD-10-CM | POA: Diagnosis not present

## 2019-02-03 DIAGNOSIS — E7800 Pure hypercholesterolemia, unspecified: Secondary | ICD-10-CM | POA: Diagnosis not present

## 2019-02-03 DIAGNOSIS — Z1231 Encounter for screening mammogram for malignant neoplasm of breast: Secondary | ICD-10-CM

## 2019-02-03 DIAGNOSIS — Z803 Family history of malignant neoplasm of breast: Secondary | ICD-10-CM | POA: Diagnosis not present

## 2019-02-03 LAB — CBC WITH DIFFERENTIAL/PLATELET
Abs Immature Granulocytes: 0.04 10*3/uL (ref 0.00–0.07)
Basophils Absolute: 0 10*3/uL (ref 0.0–0.1)
Basophils Relative: 1 %
Eosinophils Absolute: 0 10*3/uL (ref 0.0–0.5)
Eosinophils Relative: 0 %
HCT: 41.4 % (ref 36.0–46.0)
Hemoglobin: 14 g/dL (ref 12.0–15.0)
Immature Granulocytes: 2 %
Lymphocytes Relative: 35 %
Lymphs Abs: 0.8 10*3/uL (ref 0.7–4.0)
MCH: 32 pg (ref 26.0–34.0)
MCHC: 33.8 g/dL (ref 30.0–36.0)
MCV: 94.5 fL (ref 80.0–100.0)
Monocytes Absolute: 0.4 10*3/uL (ref 0.1–1.0)
Monocytes Relative: 15 %
Neutro Abs: 1.1 10*3/uL — ABNORMAL LOW (ref 1.7–7.7)
Neutrophils Relative %: 47 %
Platelets: 86 10*3/uL — ABNORMAL LOW (ref 150–400)
RBC: 4.38 MIL/uL (ref 3.87–5.11)
RDW: 14.1 % (ref 11.5–15.5)
WBC: 2.3 10*3/uL — ABNORMAL LOW (ref 4.0–10.5)
nRBC: 0 % (ref 0.0–0.2)

## 2019-02-03 LAB — COMPREHENSIVE METABOLIC PANEL
ALT: 31 U/L (ref 0–44)
AST: 37 U/L (ref 15–41)
Albumin: 4.3 g/dL (ref 3.5–5.0)
Alkaline Phosphatase: 61 U/L (ref 38–126)
Anion gap: 10 (ref 5–15)
BUN: 14 mg/dL (ref 8–23)
CO2: 26 mmol/L (ref 22–32)
Calcium: 10 mg/dL (ref 8.9–10.3)
Chloride: 104 mmol/L (ref 98–111)
Creatinine, Ser: 0.87 mg/dL (ref 0.44–1.00)
GFR calc Af Amer: >60 mL/min (ref >60)
GFR calc non Af Amer: >60 mL/min (ref >60)
Glucose, Bld: 102 mg/dL — ABNORMAL HIGH (ref 70–99)
Potassium: 3.5 mmol/L (ref 3.5–5.1)
Sodium: 140 mmol/L (ref 135–145)
Total Bilirubin: 1.2 mg/dL (ref 0.3–1.2)
Total Protein: 7.1 g/dL (ref 6.5–8.1)

## 2019-02-03 LAB — LACTATE DEHYDROGENASE: LDH: 190 U/L (ref 98–192)

## 2019-02-03 NOTE — Assessment & Plan Note (Addendum)
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 Currently on venetoclax-March 2020.  Stable.  #Currently on Venclexta 300 mg a day; today white count is 2.3 ANC 1.1 platelets 86 hemoglobin 13 continue venetoclax to 300 mg once a day.  For now continue current therapy.  # Thrombocytopenia-84- likely secondary to venetoclax/CLL.STABLE.    # A. Fib-stable continue Xarelto [Dr.Kowalski]. STABLE  # # I discussed regarding Covid precautions/and also discussed proceeding with Covid vaccination when available.  Discussed that unfortunately the data safety and efficacy of vaccination is unclear especially in patients with immunocompromised state.  However, I think the benefits of the vaccination outweigh the potential risks.  Patient will check for health department for updates/information.  # DISPOSITION: # follow up in 3 months-MD: labs- cbc/cmp/ldh; Dr.B

## 2019-02-03 NOTE — Progress Notes (Signed)
[place] OFFICE PROGRESS NOTE  Patient Care Team: [name], MD as PCP - General (Unknown Physician Specialty) Bary Castilla, Forest Gleason, MD (General Surgery) Corey Skains, MD as Consulting Physician (Cardiology) Anell Barr, OD as Consulting Physician (Optometry) Cammie Sickle, MD as Consulting Physician (Internal Medicine)  Cancer Staging No matching staging information was found for the patient.   Oncology History Overview Note  1. Lymphoma low-grade, status Rituxan therapy. 2. Recurrent disease by clinical examination in December of 2012 3. Ovarian mass on PET scan in December of 2012 (normal CA 125). Ovarian mass has resolved after rituximab therapy. 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  Patient had total 5 cycles of chemotherapy with bendamustine and rituximab and 6 cycle was omitted because of significant side effects with and weakness and fatigue.  # OCT 2016- PET significant response; ON surveillance  # 18th OCT 2018- [place];SEP 2018-/OCT 2018 CT scan- progression; s/p 6 cycles [#6 in April 2019]  # JAN 2020-CT scan shows progressive disease; start a acalabrutinib 100mg  BID;# feb 6th2020- decrease dose of acalbrutinib to 100 mg/day [sec to spon bruising] March 1st week- STOPPED [place] sec to spon brusing  # MARCH 17th 2020- Start Venetoclax [out pt ramp up]; 300 mg a day    # 11/06/2016- colonoscopy [Dr.Byrnett- 2 polyps]  #History of A. Fib-Xarelto; CKD stage II-III.   # #Right posterior neck melanoma stage I [ Dr.Lee/Elkins]..   -------------------------------------------------------------------    # left ? Adnexal mass- ? Etiology; mild SUV uptake incidental [stable compared to previous PET in 2016] Previous  workup 2015- vaginal ultrasound negative. MRI February 2018- approximately 3 cm in size; slow increase in the size of the left adnexal mass over at least 3 years- suspect involvement of lymphoma rather than a primary gynecologic malignancy. --------------------------------------------------------------   DIAGNOSIS: SMALL LYMPHOCYTIC LEUKEMIA  STAGE:  IV    ;GOALS: control/pallaitive  CURRENT/MOST RECENT THERAPY: Venatoclax.    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   Patient was recently treated for UTI by PCP-currently improved.  Denies any swelling in the legs.  Denies any worsening shortness of breath or cough.  Mild fatigue.  Review of Systems  Constitutional: Positive for malaise/fatigue. Negative for chills, diaphoresis, fever and weight loss.  HENT: Negative for nosebleeds and sore throat.   Eyes: Negative for double vision.  Respiratory: Negative for cough, hemoptysis, sputum production, shortness of breath and wheezing.   Cardiovascular: Positive for leg swelling. Negative for chest pain, palpitations and orthopnea.  Gastrointestinal: Negative for abdominal pain, blood in stool, constipation, diarrhea, heartburn, melena and vomiting.  Genitourinary: Negative for dysuria, frequency and urgency.  Musculoskeletal: Negative for back pain and joint pain.  Neurological: Negative for dizziness, tingling, focal weakness, weakness and headaches.  Psychiatric/Behavioral: Negative for depression. The patient is not nervous/anxious and does not have insomnia.     PAST MEDICAL HISTORY :  Past Medical History:  Diagnosis Date  . A-fib ([place])   . Benign neoplasm of rectum and anal canal   . 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 Hypercholesteremia   .  Hypertension   . Hyperthyroidism   . Lymphoma ([place]) 2007  . Non Hodgkin's lymphoma ([place]) 01/22/2006  . Personal history of chemotherapy     PAST SURGICAL HISTORY :   Past Surgical History:  Procedure Laterality Date  . ABDOMINAL HYSTERECTOMY  1958  . APPENDECTOMY    . BREAST BIOPSY Right 1970   right breast biopsy with clip- neg  . BREAST BIOPSY Left 2002   left breast biopsy with clip-neg  . BREAST BIOPSY Right 2014   right breast biopsy with clip-neg  . BREAST CYST ASPIRATION Bilateral 2000   bilateral fine needle aspiration  . BREAST SURGERY Left 1990   biopsy  . BREAST SURGERY Right May 08, 2012   complex fibroadenoma without malignancy.  [name] CARDIAC CATHETERIZATION  2014  . COLONOSCOPY  2009, 2012    hyperplastic rectal polyp 2012 tubular adenoma of proximal ascending colon 2000 9 repeat exam due to 2017.  [name] COLONOSCOPY WITH PROPOFOL N/A 11/06/2016   Procedure: COLONOSCOPY WITH PROPOFOL;  Surgeon: Robert Bellow, MD;  Location: [facility] ENDOSCOPY;  Service: Endoscopy;  Laterality: N/A;  . CORONARY ARTERY BYPASS GRAFT  1999  . FLEXIBLE SIGMOIDOSCOPY  1998    FAMILY HISTORY :   Family History  Problem Relation Age of Onset  . Lung cancer Father        Died age 45  . Heart disease Mother        Died age 62  . Asthma Mother   . CAD Brother        double bypass  . Heart attack Sister   . Tuberculosis Sister        64 years old  . Lung cancer Sister        Died in her 17s  . Angina Sister   . Breast cancer Sister 61  . Rheum arthritis Sister   . Aneurysm Sister        Brain, died age 33  . Angina Sister   . Heart disease Brother        Stent placement  . Heart disease Brother        stent placement  . Cancer Other        breast cancer niece, pancreatic cancer niece  . Breast cancer Other   . Ovarian cancer Neg Hx   . Diabetes Neg Hx     SOCIAL HISTORY:   Social History   Tobacco Use  . Smoking status: Never Smoker  . Smokeless tobacco: Never Used   Substance Use Topics  . Alcohol use: Yes    Alcohol/week: 4.0 standard drinks    Types: 1 Shots of liquor, 3 Glasses of wine per week  . Drug use: No    ALLERGIES:  is allergic to pravastatin.  MEDICATIONS:  Current Outpatient Medications  Medication Sig Dispense Refill  . allopurinol (ZYLOPRIM) 300 MG tablet TAKE 1 TABLET(300 MG) BY MOUTH TWICE DAILY 180 tablet 3  . ezetimibe (ZETIA) 10 MG tablet Take 1 tablet (10 mg total) daily by mouth. 30 tablet 12  . isosorbide mononitrate (IMDUR) 30 MG 24 hr tablet     . levothyroxine (SYNTHROID) 88 MCG tablet TAKE 1 TABLET BY MOUTH  DAILY BEFORE BREAKFAST 90 tablet 3  . lisinopril-hydrochlorothiazide (ZESTORETIC) 20-12.5 MG tablet TAKE 1 TABLET BY MOUTH  DAILY 90 tablet 1  . Melatonin 1 MG CAPS Take 1 capsule by mouth at bedtime.     . metoprolol succinate (TOPROL-XL) 25 MG 24 hr tablet Take 25 mg  by mouth daily.     . nitrofurantoin, macrocrystal-monohydrate, (MACROBID) 100 MG capsule Take 1 capsule (100 mg total) by mouth 2 (two) times daily. 20 capsule 3  . ondansetron (ZOFRAN) 4 MG tablet Take 1 tablet (4 mg total) by mouth every 8 (eight) hours as needed for nausea or vomiting. 40 tablet 3  . oxybutynin (DITROPAN) 5 MG tablet Take 1 tablet (5 mg total) by mouth 2 (two) times daily. 60 tablet 11  . VENCLEXTA 100 MG TABS TAKE 3 TABLETS BY MOUTH DAILY 90 tablet 2  . XARELTO 20 MG TABS tablet Take 20 mg by mouth daily with supper.      No current facility-administered medications for this visit.   Facility-Administered Medications Ordered in Other Visits  Medication Dose Route Frequency Provider Last Rate Last Admin  . heparin lock flush 100 unit/mL  500 Units Intravenous Once Charlaine Dalton R, MD      . sodium chloride flush (NS) 0.9 % injection 10 mL  10 mL Intravenous PRN Cammie Sickle, MD        PHYSICAL EXAMINATION: ECOG PERFORMANCE STATUS: 1 - Symptomatic but completely ambulatory  BP (!) 145/83 (BP Location: Left  Arm, Patient Position: Sitting, Cuff Size: Normal)   Pulse 84   Temp (!) 96.5 F (35.8 C) (Tympanic)   Wt 145 lb (65.8 kg)   BMI 24.13 kg/m   Filed Weights   02/03/19 1019  Weight: 145 lb (65.8 kg)    Physical Exam  Constitutional: She is oriented to person, place, and time and well-developed, well-nourished, and in no distress.  HENT:  Head: Normocephalic and atraumatic.  Mouth/Throat: Oropharynx is clear and moist. No oropharyngeal exudate.  Approximately 1 cm lymph node felt in the right axilla.  None on the left.  Eyes: Pupils are equal, round, and reactive to light.  Cardiovascular: Normal rate and regular rhythm.  Pulmonary/Chest: No respiratory distress. She has no wheezes.  Abdominal: Soft. Bowel sounds are normal. She exhibits no distension and no mass. There is no abdominal tenderness. There is no rebound and no guarding.  Musculoskeletal:        General: Edema present. No tenderness. Normal range of motion.     Cervical back: Normal range of motion and neck supple.  Neurological: She is alert and oriented to person, place, and time.  Skin: Skin is warm.  Psychiatric: Affect normal.    LABORATORY DATA:  I have reviewed the data as listed    Component Value Date/Time   NA 140 02/03/2019 0949   NA 139 07/03/2017 1425   NA 141 05/11/2014 0853   K 3.5 02/03/2019 0949   K 3.6 05/11/2014 0853   CL 104 02/03/2019 0949   CL 103 05/11/2014 0853   CO2 26 02/03/2019 0949   CO2 29 05/11/2014 0853   GLUCOSE 102 (H) 02/03/2019 0949   GLUCOSE 107 (H) 05/11/2014 0853   BUN 14 02/03/2019 0949   BUN 19 07/03/2017 1425   BUN 22 (H) 05/11/2014 0853   CREATININE 0.87 02/03/2019 0949   CREATININE 1.07 (H) 05/11/2014 0853   CALCIUM 10.0 02/03/2019 0949   CALCIUM 10.1 05/11/2014 0853   PROT 7.1 02/03/2019 0949   PROT 6.4 07/03/2017 1425   PROT 7.3 05/11/2014 0853   ALBUMIN 4.3 02/03/2019 0949   ALBUMIN 4.3 07/03/2017 1425   ALBUMIN 4.7 05/11/2014 0853   AST 37 02/03/2019  0949   AST 29 05/11/2014 0853   ALT 31 02/03/2019 0949   ALT 20  05/11/2014 0853   ALKPHOS 61 02/03/2019 0949   ALKPHOS 47 05/11/2014 0853   BILITOT 1.2 02/03/2019 0949   BILITOT 0.5 07/03/2017 1425   BILITOT 1.0 05/11/2014 0853   GFRNONAA >60 02/03/2019 0949   GFRNONAA 51 (L) 05/11/2014 0853   GFRAA >60 02/03/2019 0949   GFRAA 59 (L) 05/11/2014 0853    No results found for: SPEP, UPEP  Lab Results  Component Value Date   WBC 2.3 (L) 02/03/2019   NEUTROABS 1.1 (L) 02/03/2019   HGB 14.0 02/03/2019   HCT 41.4 02/03/2019   MCV 94.5 02/03/2019   PLT 86 (L) 02/03/2019      Chemistry      Component Value Date/Time   NA 140 02/03/2019 0949   NA 139 07/03/2017 1425   NA 141 05/11/2014 0853   K 3.5 02/03/2019 0949   K 3.6 05/11/2014 0853   CL 104 02/03/2019 0949   CL 103 05/11/2014 0853   CO2 26 02/03/2019 0949   CO2 29 05/11/2014 0853   BUN 14 02/03/2019 0949   BUN 19 07/03/2017 1425   BUN 22 (H) 05/11/2014 0853   CREATININE 0.87 02/03/2019 0949   CREATININE 1.07 (H) 05/11/2014 0853   GLU 98 05/25/2013 0000      Component Value Date/Time   CALCIUM 10.0 02/03/2019 0949   CALCIUM 10.1 05/11/2014 0853   ALKPHOS 61 02/03/2019 0949   ALKPHOS 47 05/11/2014 0853   AST 37 02/03/2019 0949   AST 29 05/11/2014 0853   ALT 31 02/03/2019 0949   ALT 20 05/11/2014 0853   BILITOT 1.2 02/03/2019 0949   BILITOT 0.5 07/03/2017 1425   BILITOT 1.0 05/11/2014 0853       RADIOGRAPHIC STUDIES: I have personally reviewed the radiological images as listed and agreed with the findings in the report. No results found.   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 Currently on venetoclax-March 2020.  Stable.  #Currently on Venclexta  300 mg a day; today white count is 2.3 ANC 1.1 platelets 86 hemoglobin 13 continue venetoclax to 300 mg once a day.  For now continue current therapy.  # Thrombocytopenia-84- likely secondary to venetoclax/CLL.STABLE.    # A. Fib-stable continue Xarelto [Dr.Kowalski]. STABLE  # # I discussed regarding Covid precautions/and also discussed proceeding with Covid vaccination when available.  Discussed that unfortunately the data safety and efficacy of vaccination is unclear especially in patients with immunocompromised state.  However, I think the benefits of the vaccination outweigh the potential risks.  Patient will check for health department for updates/information.  # DISPOSITION: # follow up in 3 months-MD: labs- cbc/cmp/ldh; Dr.B     Orders Placed This Encounter  Procedures  . CBC with Differential    Standing Status:   Future    Standing Expiration Date:   02/03/2020  . Comprehensive metabolic panel    Standing Status:   Future    Standing Expiration Date:   02/03/2020  . Lactate dehydrogenase    Standing Status:   Future    Standing Expiration Date:   02/03/2020   All questions were answered. The patient knows to call the clinic with any problems, questions or concerns.      Cammie Sickle, MD 02/03/2019 12:38 PM

## 2019-02-10 DIAGNOSIS — Z23 Encounter for immunization: Secondary | ICD-10-CM | POA: Diagnosis not present

## 2019-02-11 DIAGNOSIS — I482 Chronic atrial fibrillation, unspecified: Secondary | ICD-10-CM | POA: Diagnosis not present

## 2019-02-11 DIAGNOSIS — E782 Mixed hyperlipidemia: Secondary | ICD-10-CM | POA: Diagnosis not present

## 2019-02-11 DIAGNOSIS — I25810 Atherosclerosis of coronary artery bypass graft(s) without angina pectoris: Secondary | ICD-10-CM | POA: Diagnosis not present

## 2019-02-11 DIAGNOSIS — I10 Essential (primary) hypertension: Secondary | ICD-10-CM | POA: Diagnosis not present

## 2019-02-17 MED FILL — VENCLEXTA 100 MG TABS: 100 | 30 days supply | Qty: 90 | Fill #1

## 2019-02-18 ENCOUNTER — Other Ambulatory Visit: Payer: Self-pay | Admitting: General Surgery

## 2019-02-19 ENCOUNTER — Telehealth: Payer: Self-pay | Admitting: Internal Medicine

## 2019-02-19 NOTE — Telephone Encounter (Signed)
Sent my chart message.   H/T-please reach out to the patient to reiterate my recommendations below  ------------------------------------------------------------------ HI Ms. Jacqueline Webb Mylar try to reach you over the phone to discuss regarding holding venetoclax prior to colonoscopy.  I would recommend holding venetoclax for 1 week prior to colonoscopy-to help improve your slightly low blood counts.  You can restart venetoclax the day after colonoscopy.  I also discussed with Dr. Tollie Pizza  Please call us if you have any questions or concerns.   Thanks Dr.B ------------------------------------------

## 2019-02-25 ENCOUNTER — Other Ambulatory Visit: Payer: Medicare Other

## 2019-02-26 ENCOUNTER — Other Ambulatory Visit: Payer: Self-pay

## 2019-02-26 ENCOUNTER — Encounter: Payer: Self-pay | Admitting: General Surgery

## 2019-02-26 ENCOUNTER — Ambulatory Visit
Admission: RE | Admit: 2019-02-26 | Discharge: 2019-02-26 | Disposition: A | Discharge status: Home / Self Care | Payer: Medicare Other | Source: Ambulatory Visit | Attending: General Surgery | Admitting: General Surgery

## 2019-02-26 ENCOUNTER — Other Ambulatory Visit
Admission: RE | Admit: 2019-02-26 | Discharge: 2019-02-26 | Disposition: A | Discharge status: Home / Self Care | Payer: Medicare Other | Source: Ambulatory Visit | Attending: General Surgery | Admitting: General Surgery

## 2019-02-26 DIAGNOSIS — Z1231 Encounter for screening mammogram for malignant neoplasm of breast: Secondary | ICD-10-CM | POA: Diagnosis not present

## 2019-02-26 DIAGNOSIS — Z20822 Contact with and (suspected) exposure to covid-19: Secondary | ICD-10-CM | POA: Diagnosis not present

## 2019-02-26 DIAGNOSIS — Z01812 Encounter for preprocedural laboratory examination: Secondary | ICD-10-CM | POA: Diagnosis not present

## 2019-02-26 LAB — SARS CORONAVIRUS 2 (TAT 6-24 HRS): SARS Coronavirus 2: NEGATIVE

## 2019-02-27 ENCOUNTER — Encounter
Admission: RE | Disposition: A | Discharge status: Home / Self Care | Payer: Self-pay | Source: Home / Self Care | Attending: General Surgery

## 2019-02-27 ENCOUNTER — Ambulatory Visit
Admission: RE | Admit: 2019-02-27 | Discharge: 2019-02-27 | Disposition: A | Discharge status: Home / Self Care | Payer: Medicare Other | Attending: General Surgery | Admitting: General Surgery

## 2019-02-27 ENCOUNTER — Encounter: Payer: Self-pay | Admitting: General Surgery

## 2019-02-27 ENCOUNTER — Ambulatory Visit: Payer: Medicare Other | Admitting: Certified Registered"

## 2019-02-27 DIAGNOSIS — D123 Benign neoplasm of transverse colon: Secondary | ICD-10-CM | POA: Insufficient documentation

## 2019-02-27 DIAGNOSIS — I2510 Atherosclerotic heart disease of native coronary artery without angina pectoris: Secondary | ICD-10-CM | POA: Diagnosis not present

## 2019-02-27 DIAGNOSIS — D122 Benign neoplasm of ascending colon: Secondary | ICD-10-CM | POA: Insufficient documentation

## 2019-02-27 DIAGNOSIS — Z79890 Hormone replacement therapy (postmenopausal): Secondary | ICD-10-CM | POA: Diagnosis not present

## 2019-02-27 DIAGNOSIS — E039 Hypothyroidism, unspecified: Secondary | ICD-10-CM | POA: Diagnosis not present

## 2019-02-27 DIAGNOSIS — Z951 Presence of aortocoronary bypass graft: Secondary | ICD-10-CM | POA: Diagnosis not present

## 2019-02-27 DIAGNOSIS — Z1211 Encounter for screening for malignant neoplasm of colon: Secondary | ICD-10-CM | POA: Diagnosis not present

## 2019-02-27 DIAGNOSIS — Z8572 Personal history of non-Hodgkin lymphomas: Secondary | ICD-10-CM | POA: Insufficient documentation

## 2019-02-27 DIAGNOSIS — I4891 Unspecified atrial fibrillation: Secondary | ICD-10-CM | POA: Insufficient documentation

## 2019-02-27 DIAGNOSIS — Z79899 Other long term (current) drug therapy: Secondary | ICD-10-CM | POA: Diagnosis not present

## 2019-02-27 DIAGNOSIS — Z9221 Personal history of antineoplastic chemotherapy: Secondary | ICD-10-CM | POA: Diagnosis not present

## 2019-02-27 DIAGNOSIS — K635 Polyp of colon: Secondary | ICD-10-CM | POA: Diagnosis not present

## 2019-02-27 DIAGNOSIS — Z7901 Long term (current) use of anticoagulants: Secondary | ICD-10-CM | POA: Diagnosis not present

## 2019-02-27 DIAGNOSIS — I10 Essential (primary) hypertension: Secondary | ICD-10-CM | POA: Insufficient documentation

## 2019-02-27 DIAGNOSIS — Z09 Encounter for follow-up examination after completed treatment for conditions other than malignant neoplasm: Secondary | ICD-10-CM | POA: Diagnosis present

## 2019-02-27 DIAGNOSIS — Z856 Personal history of leukemia: Secondary | ICD-10-CM | POA: Diagnosis not present

## 2019-02-27 DIAGNOSIS — Z86010 Personal history of colonic polyps: Secondary | ICD-10-CM | POA: Diagnosis not present

## 2019-02-27 HISTORY — PX: COLONOSCOPY WITH PROPOFOL: SHX5780

## 2019-02-27 HISTORY — DX: Cardiac arrhythmia, unspecified: I49.9

## 2019-02-27 SURGERY — COLONOSCOPY WITH PROPOFOL
Anesthesia: General

## 2019-02-27 MED ORDER — PROPOFOL 500 MG/50ML IV EMUL
INTRAVENOUS | Status: DC | PRN
  Administered 2019-02-27: 75 ug/kg/min via INTRAVENOUS

## 2019-02-27 MED ORDER — LIDOCAINE HCL (PF) 2 % IJ SOLN
INTRAMUSCULAR | Status: AC
  Filled 2019-02-27: qty 10

## 2019-02-27 MED ORDER — PROPOFOL 10 MG/ML IV BOLUS
INTRAVENOUS | Status: AC
  Filled 2019-02-27: qty 20

## 2019-02-27 MED ORDER — LIDOCAINE HCL (CARDIAC) PF 100 MG/5ML IV SOSY
PREFILLED_SYRINGE | INTRAVENOUS | Status: DC | PRN
  Administered 2019-02-27: 50 mg via INTRAVENOUS

## 2019-02-27 MED ORDER — SODIUM CHLORIDE 0.9 % IV SOLN: INTRAVENOUS | Status: DC | PRN

## 2019-02-27 MED ORDER — SODIUM CHLORIDE 0.9 % IV SOLN
INTRAVENOUS | Status: DC
  Administered 2019-02-27: 1000 mL via INTRAVENOUS

## 2019-02-27 MED ORDER — PROPOFOL 10 MG/ML IV BOLUS
INTRAVENOUS | Status: DC | PRN
  Administered 2019-02-27: 30 mg via INTRAVENOUS
  Administered 2019-02-27: 20 mg via INTRAVENOUS
  Administered 2019-02-27: 50 mg via INTRAVENOUS
  Administered 2019-02-27 (×2): 20 mg via INTRAVENOUS
  Administered 2019-02-27 (×2): 30 mg via INTRAVENOUS

## 2019-02-27 MED ORDER — PROPOFOL 500 MG/50ML IV EMUL
INTRAVENOUS | Status: AC
  Filled 2019-02-27: qty 50

## 2019-02-27 NOTE — Transfer of Care (Signed)
Immediate Anesthesia Transfer of Care Note  Patient: Jacqueline Webb  Procedure(s) Performed: COLONOSCOPY WITH PROPOFOL (N/A )  Patient Location: Endoscopy   Anesthesia Type:General  Level of Consciousness: awake, alert  and oriented  Airway & Oxygen Therapy: Patient Spontanous Breathing  Post-op Assessment: Report given to RN and Post -op Vital signs reviewed and stable  Post vital signs: Reviewed and stable  Last Vitals:  Vitals Value Taken Time  BP 110/69 02/27/19 1000  Temp 36.6 C 02/27/19 0957  Pulse 78 02/27/19 1000  Resp 20 02/27/19 1000  SpO2 100 % 02/27/19 1000  Vitals shown include unvalidated device data.  Last Pain:  Vitals:   02/27/19 0957  TempSrc: Temporal  PainSc: 0-No pain         Complications: No apparent anesthesia complications

## 2019-02-27 NOTE — Op Note (Addendum)
[facility] Patient Name: Jacqueline Webb Procedure Date: 02/27/2019 8:55 AM MRN: [id number] Account #: [id number] Date of Birth: 10-29-1940 Admit Type: Outpatient Age: 79 Room: [facility] ENDO ROOM 1 Gender: Female Note Status: Finalized Procedure:             Colonoscopy Indications:           High risk colon cancer surveillance: Personal history                         of colonic polyps Providers:             Robert Bellow, MD Referring MD:          Janine Ores. [name], MD (Referring MD) Medicines:             Monitored Anesthesia Care Complications:         No immediate complications. Procedure:             Pre-Anesthesia Assessment:                        - Prior to the procedure, a History and Physical was                         performed, and patient medications, allergies and                         sensitivities were reviewed. The patient's tolerance                         of previous anesthesia was reviewed.                        - The risks and benefits of the procedure and the                         sedation options and risks were discussed with the                         patient. All questions were answered and informed                         consent was obtained.                        After obtaining informed consent, the colonoscope was                         passed under direct vision. Throughout the procedure,                         the patient's blood pressure, pulse, and oxygen                         saturations were monitored continuously. The was                         introduced through the anus and advanced to the the                         cecum, identified  by appendiceal orifice and ileocecal                         valve. The colonoscopy was technically difficult and                         complex due to significant looping and a tortuous                         colon. Successful completion of the procedure was                          aided by applying abdominal pressure. The patient                         tolerated the procedure well. The quality of the bowel                         preparation was excellent. Findings:      A 8 mm polyp was found in the transverse colon. The polyp was sessile.       The polyp was removed with a hot snare. Resection and retrieval were       complete.      A 16 mm polyp was found in the proximal ascending colon. The polyp was       sessile. The polyp was removed with a saline injection-lift technique       using a hot snare. Resection and retrieval were complete.      The retroflexed view of the distal rectum and anal verge was normal and       showed no anal or rectal abnormalities. Impression:            - One 8 mm polyp in the transverse colon, removed with                         a hot snare. Resected and retrieved.                        - One 16 mm polyp in the proximal ascending colon,                         removed using injection-lift and a hot snare. Resected                         and retrieved.                        - The distal rectum and anal verge are normal on                         retroflexion view. Recommendation:        - Telephone endoscopist for pathology results in 1                         week. Procedure Code(s):     --- Professional ---                        [phone], Colonoscopy, flexible; with removal of  tumor(s), polyp(s), or other lesion(s) by snare                         technique                        [id number], Colonoscopy, flexible; with directed submucosal                         injection(s), any substance Diagnosis Code(s):     --- Professional ---                        Z86.010, Personal history of colonic polyps                        K63.5, Polyp of colon CPT copyright 2019 American Medical Association. All rights reserved. The codes documented in this report are preliminary and upon coder  review may  be revised to meet current compliance requirements. Robert Bellow, MD 02/27/2019 9:58:53 AM This report has been signed electronically. Number of Addenda: 0 Note Initiated On: 02/27/2019 8:55 AM Scope Withdrawal Time: 0 hours 22 minutes 18 seconds  Total Procedure Duration: 0 hours 42 minutes 49 seconds  Estimated Blood Loss:  Estimated blood loss: none.      [facility]

## 2019-02-27 NOTE — Anesthesia Preprocedure Evaluation (Signed)
Anesthesia Evaluation  Patient identified by MRN, date of birth, ID band Patient awake    Reviewed: Allergy & Precautions, NPO status , Patient's Chart, lab work & pertinent test results  History of Anesthesia Complications Negative for: history of anesthetic complications  Airway Mallampati: II  TM Distance: >3 FB Neck ROM: Full    Dental  (+) Poor Dentition, Missing   Pulmonary neg pulmonary ROS, neg sleep apnea, neg COPD,    breath sounds clear to auscultation- rhonchi (-) wheezing      Cardiovascular hypertension, + CAD and + CABG (~20 yrs ago)  (-) Cardiac Stents + dysrhythmias Atrial Fibrillation  Rhythm:Regular Rate:Normal - Systolic murmurs and - Diastolic murmurs    Neuro/Psych neg Seizures negative neurological ROS  negative psych ROS   GI/Hepatic negative GI ROS, Neg liver ROS,   Endo/Other  neg diabetesHypothyroidism   Renal/GU negative Renal ROS     Musculoskeletal negative musculoskeletal ROS (+)   Abdominal   Peds  Hematology negative hematology ROS (+)   Anesthesia Other Findings Past Medical History: No date: A-fib (HCC) No date: Benign neoplasm of rectum and anal canal No date: Chronic leukemia of unspecified cell type, without mention  of having achieved remission No date: Dysrhythmia     Comment:  A-Fib No date: Hypercholesteremia No date: Hypertension No date: Hyperthyroidism 2007: Lymphoma ([place]) 01/22/2006: Non Hodgkin's lymphoma ([place]) No date: Personal history of chemotherapy   Reproductive/Obstetrics                             Anesthesia Physical Anesthesia Plan  ASA: III  Anesthesia Plan: General   Post-op Pain Management:    Induction: Intravenous  PONV Risk Score and Plan: 2 and Propofol infusion  Airway Management Planned: Natural Airway  Additional Equipment:   Intra-op Plan:   Post-operative Plan:   Informed Consent: I have reviewed  the patients History and Physical, chart, labs and discussed the procedure including the risks, benefits and alternatives for the proposed anesthesia with the patient or authorized representative who has indicated his/her understanding and acceptance.     Dental advisory given  Plan Discussed with: CRNA and Anesthesiologist  Anesthesia Plan Comments:         Anesthesia Quick Evaluation

## 2019-02-27 NOTE — Anesthesia Postprocedure Evaluation (Signed)
Anesthesia Post Note  Patient: Jacqueline Webb  Procedure(s) Performed: COLONOSCOPY WITH PROPOFOL (N/A )  Patient location during evaluation: Endoscopy Anesthesia Type: General Level of consciousness: awake and alert and oriented Pain management: pain level controlled Vital Signs Assessment: post-procedure vital signs reviewed and stable Respiratory status: spontaneous breathing, nonlabored ventilation and respiratory function stable Cardiovascular status: blood pressure returned to baseline and stable Postop Assessment: no signs of nausea or vomiting Anesthetic complications: no     Last Vitals:  Vitals:   02/27/19 0957 02/27/19 1027  BP: 110/69 138/78  Pulse: 77   Resp: 19   Temp: 36.6 C   SpO2: 98%     Last Pain:  Vitals:   02/27/19 1027  TempSrc:   PainSc: 0-No pain                 [name]

## 2019-02-27 NOTE — H&P (Signed)
Jacqueline Webb [id number] Jun 16, 1940     HPI:   This patient had a sizable polyp in the ascending colon partially removed in October 2018. A 2 year follow up was planned, delayed by the [place] flu pandemic.  She tolerated the prep well.      Medications Prior to Admission  Medication Sig Dispense Refill Last Dose  . Docusate Calcium (STOOL SOFTENER PO) Take by mouth as needed.   Past Week at Unknown time  . ezetimibe (ZETIA) 10 MG tablet Take 1 tablet (10 mg total) daily by mouth. 30 tablet 12 Past Week at Unknown time  . isosorbide mononitrate (IMDUR) 30 MG 24 hr tablet    02/26/2019 at Unknown time  . levothyroxine (SYNTHROID) 88 MCG tablet TAKE 1 TABLET BY MOUTH  DAILY BEFORE BREAKFAST 90 tablet 3 02/26/2019 at Unknown time  . lisinopril-hydrochlorothiazide (ZESTORETIC) 20-12.5 MG tablet TAKE 1 TABLET BY MOUTH  DAILY 90 tablet 1 02/26/2019 at Unknown time  . Melatonin 1 MG CAPS Take 1 capsule by mouth at bedtime.    Past Week at Unknown time  . metoprolol succinate (TOPROL-XL) 25 MG 24 hr tablet Take 25 mg by mouth daily.    02/26/2019 at Unknown time  . ondansetron (ZOFRAN) 4 MG tablet Take 1 tablet (4 mg total) by mouth every 8 (eight) hours as needed for nausea or vomiting. 40 tablet 3 Past Week at Unknown time  . oxybutynin (DITROPAN) 5 MG tablet Take 1 tablet (5 mg total) by mouth 2 (two) times daily. 60 tablet 11 Past Week at Unknown time  . VENCLEXTA 100 MG TABS TAKE 3 TABLETS BY MOUTH DAILY 90 tablet 2 Past Week at Unknown time  . XARELTO 20 MG TABS tablet Take 20 mg by mouth daily with supper.    Past Week at Unknown time  . allopurinol (ZYLOPRIM) 300 MG tablet TAKE 1 TABLET(300 MG) BY MOUTH TWICE DAILY (Patient not taking: TAKE 1 TABLET(300 MG) BY MOUTH TWICE DAILY) 180 tablet 3 Not Taking  . nitrofurantoin, macrocrystal-monohydrate, (MACROBID) 100 MG capsule Take 1 capsule (100 mg total) by mouth 2 (two) times daily. (Patient not taking: Reported on 02/27/2019) 20 capsule 3 Completed  Course at Unknown time   Allergies  Allergen Reactions  . Pravastatin Other (See Comments)   Past Medical History:  Diagnosis Date  . A-fib ([place])   . Benign neoplasm of rectum and anal canal   . 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 Dysrhythmia    A-Fib  . Hypercholesteremia   . Hypertension   . Hyperthyroidism   . Lymphoma ([place]) 2007  . Non Hodgkin's lymphoma ([place]) 01/22/2006  . Personal history of chemotherapy    Past Surgical History:  Procedure Laterality Date  . ABDOMINAL HYSTERECTOMY  1958  . APPENDECTOMY    . BREAST BIOPSY Right 1970   right breast biopsy with clip- neg  . BREAST BIOPSY Left 2002   left breast biopsy with clip-neg  . BREAST BIOPSY Right 2014   right breast biopsy with clip-neg  . BREAST CYST ASPIRATION Bilateral 2000   bilateral fine needle aspiration  . BREAST SURGERY Left 1990   biopsy  . BREAST SURGERY Right May 08, 2012   complex fibroadenoma without malignancy.  [name] CARDIAC CATHETERIZATION  2014  . COLONOSCOPY  2009, 2012    hyperplastic rectal polyp 2012 tubular adenoma of proximal ascending colon 2000 9 repeat exam due to 2017.  [name] COLONOSCOPY WITH PROPOFOL  N/A 11/06/2016   Procedure: COLONOSCOPY WITH PROPOFOL;  Surgeon: Robert Bellow, MD;  Location: [facility] ENDOSCOPY;  Service: Endoscopy;  Laterality: N/A;  . CORONARY ARTERY BYPASS GRAFT  1999  . [place]   Social History   Socioeconomic History  . Marital status: Married    Spouse name: Not on file  . Number of children: 2  . Years of education: In [place]  . Highest education level: 10th grade  Occupational History  . Occupation: retired  Tobacco Use  . Smoking status: Never Smoker  . Smokeless tobacco: Never Used  Substance and Sexual Activity  . Alcohol use: Yes    Alcohol/week: 4.0 standard drinks    Types: 1 Shots of liquor, 3 Glasses of wine per week  . Drug use: No  . Sexual activity: Never   Other Topics Concern  . Not on file  Social History Narrative  . Not on file   Social Determinants of Health   Financial Resource Strain: Low Risk   . Difficulty of Paying Living Expenses: Not hard at all  Food Insecurity: No Food Insecurity  . Worried About Charity fundraiser in the Last Year: Never true  . Ran Out of Food in the Last Year: Never true  Transportation Needs: No Transportation Needs  . Lack of Transportation (Medical): No  . Lack of Transportation (Non-Medical): No  Physical Activity: Inactive  . Days of Exercise per Week: 0 days  . Minutes of Exercise per Session: 0 min  Stress: No Stress Concern Present  . Feeling of Stress : Not at all  Social Connections: Unknown  . Frequency of Communication with Friends and Family: Patient refused  . Frequency of Social Gatherings with Friends and Family: Patient refused  . Attends Religious Services: Patient refused  . Active Member of Clubs or Organizations: Patient refused  . Attends Archivist Meetings: Patient refused  . Marital Status: Patient refused  Intimate Partner Violence: Unknown  . Fear of Current or Ex-Partner: Patient refused  . Emotionally Abused: Patient refused  . Physically Abused: Patient refused  . Sexually Abused: Patient refused   Social History   Social History Narrative  . Not on file     ROS: Negative.     PE: HEENT: Negative. Lungs: Clear. Cardio: RR.   Assessment/Plan:  Proceed with planned endoscopy. Forest Gleason [facility] 02/27/2019

## 2019-03-02 LAB — SURGICAL PATHOLOGY

## 2019-03-10 DIAGNOSIS — Z23 Encounter for immunization: Secondary | ICD-10-CM | POA: Diagnosis not present

## 2019-03-24 MED FILL — VENCLEXTA 100 MG TABS: 100 | 30 days supply | Qty: 90 | Fill #2

## 2019-03-30 DIAGNOSIS — L719 Rosacea, unspecified: Secondary | ICD-10-CM | POA: Diagnosis not present

## 2019-03-30 DIAGNOSIS — X32XXXS Exposure to sunlight, sequela: Secondary | ICD-10-CM | POA: Diagnosis not present

## 2019-03-30 DIAGNOSIS — L570 Actinic keratosis: Secondary | ICD-10-CM | POA: Diagnosis not present

## 2019-03-30 DIAGNOSIS — D1801 Hemangioma of skin and subcutaneous tissue: Secondary | ICD-10-CM | POA: Diagnosis not present

## 2019-03-30 DIAGNOSIS — C44729 Squamous cell carcinoma of skin of left lower limb, including hip: Secondary | ICD-10-CM | POA: Diagnosis not present

## 2019-03-30 DIAGNOSIS — L821 Other seborrheic keratosis: Secondary | ICD-10-CM | POA: Diagnosis not present

## 2019-03-30 DIAGNOSIS — I8310 Varicose veins of unspecified lower extremity with inflammation: Secondary | ICD-10-CM | POA: Diagnosis not present

## 2019-04-09 DIAGNOSIS — C44729 Squamous cell carcinoma of skin of left lower limb, including hip: Secondary | ICD-10-CM | POA: Diagnosis not present

## 2019-04-14 ENCOUNTER — Other Ambulatory Visit: Payer: Self-pay | Admitting: Internal Medicine

## 2019-04-14 DIAGNOSIS — C8308 Small cell B-cell lymphoma, lymph nodes of multiple sites: Secondary | ICD-10-CM

## 2019-04-16 MED FILL — VENCLEXTA 100 MG TABS: 100 | 30 days supply | Qty: 90 | Fill #0

## 2019-04-29 ENCOUNTER — Ambulatory Visit: Payer: Self-pay | Admitting: Family Medicine

## 2019-04-30 NOTE — Progress Notes (Signed)
Established patient visit      I,Atiya M Cummings,acting as a scribe for [name], MD.,have documented all relevant documentation on the behalf of [name], MD,as directed by  [name], MD while in the presence of [name], MD.  Patient: Jacqueline Webb   DOB: 08/22/1940   79 y.o. Female  MRN: [id number] Visit Date: 05/05/2019  Today's healthcare provider: [name], MD  Subjective:    Chief Complaint  Patient presents with  . Hypothyroidism  . Hyperlipidemia   HPI  Patient saw Dr. B today for her B-cell lymphoma.  She is clinically stable and has follow-up scans and lab work scheduled. She is physically active and plays golf regularly with friends and her husband.  She has a good appetite and feels well. 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  Adult hypothyroidism Last checked 07/03/2017 showing-okay.  Personal history of malignant neoplasm of breast From 11/26/2018-Sees Dr Bary Castilla yearly.  Hypercholesteremia Last checked 07/03/2017 showing-okay.  Patient Active Problem List   Diagnosis Date Noted  . Venous insufficiency of both lower extremities 07/15/2017  . Abdominal distension 01/03/2017  . Goals of care, counseling/discussion 11/08/2016  . Abnormal CT scan, pelvis 11/05/2016  . Intussusception of small bowel ([place]) 08/22/2016  . Mass of left ovary 03/06/2016  . Cystocele, midline 12/27/2015  . Cystocele 10/19/2015  . Status post hysterectomy 10/19/2015  . Vaginal atrophy 10/19/2015  . 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 Moderate mitral insufficiency 08/10/2015  . Persistent atrial fibrillation ([place]) 08/10/2015  . Urge incontinence of urine 06/07/2015  . Personal history of surgery to heart and great vessels, presenting hazards to health 05/14/2014  . Raynaud's syndrome 05/14/2014  . Adult hypothyroidism  05/14/2014  . Hypercholesteremia 05/14/2014  . Coronary artery abnormality 05/14/2014  . Essential (primary) hypertension 05/14/2014  . Personal history of malignant neoplasm of breast 05/14/2014  . CAD (coronary artery disease) of bypass graft 11/24/2013       Medications: Outpatient Medications Prior to Visit  Medication Sig  . allopurinol (ZYLOPRIM) 300 MG tablet TAKE 1 TABLET(300 MG) BY MOUTH TWICE DAILY  . Docusate Calcium (STOOL SOFTENER PO) Take by mouth as needed.  . ezetimibe (ZETIA) 10 MG tablet Take 1 tablet (10 mg total) daily by mouth.  . isosorbide mononitrate (IMDUR) 30 MG 24 hr tablet   . levothyroxine (SYNTHROID) 88 MCG tablet TAKE 1 TABLET BY MOUTH  DAILY BEFORE BREAKFAST  . lisinopril-hydrochlorothiazide (ZESTORETIC) 20-12.5 MG tablet TAKE 1 TABLET BY MOUTH  DAILY  . Melatonin 1 MG CAPS Take 1 capsule by mouth at bedtime.   . metoprolol succinate (TOPROL-XL) 25 MG 24 hr tablet Take 25 mg by mouth daily.   . nitrofurantoin, macrocrystal-monohydrate, (MACROBID) 100 MG capsule Take 1 capsule (100 mg total) by mouth 2 (two) times daily.  . ondansetron (ZOFRAN) 4 MG tablet Take 1 tablet (4 mg total) by mouth every 8 (eight) hours as needed for nausea or vomiting.  [name] oxybutynin (DITROPAN) 5 MG tablet Take 1 tablet (5 mg total) by mouth 2 (two) times daily.  . rosuvastatin (CRESTOR) 5 MG tablet Take 5 mg by mouth in the morning, at noon, and at bedtime.  . VENCLEXTA 100 MG TABS TAKE 3 TABLETS BY MOUTH DAILY  . XARELTO 20 MG TABS tablet Take 20 mg by mouth daily with supper.    Facility-Administered Medications Prior to  Visit  Medication Dose Route Frequency Provider  . heparin lock flush 100 unit/mL  500 Units Intravenous Once Charlaine Dalton R, MD  . sodium chloride flush (NS) 0.9 % injection 10 mL  10 mL Intravenous PRN Cammie Sickle, MD    Review of Systems  Constitutional: Negative for appetite change, chills, fatigue and fever.  Eyes: Negative.     Respiratory: Negative for chest tightness and shortness of breath.   Cardiovascular: Negative for chest pain and palpitations.  Gastrointestinal: Negative for abdominal pain, nausea and vomiting.  Allergic/Immunologic: Negative.   Neurological: Negative for dizziness and weakness.  Hematological: Negative.   Psychiatric/Behavioral: Negative.     Last metabolic panel Lab Results  Component Value Date   GLUCOSE 113 (H) 05/05/2019   NA 139 05/05/2019   K 3.5 05/05/2019   CL 102 05/05/2019   CO2 27 05/05/2019   BUN 15 05/05/2019   CREATININE 0.85 05/05/2019   GFRNONAA >60 05/05/2019   GFRAA >60 05/05/2019   CALCIUM 9.5 05/05/2019   PHOS 3.2 06/03/2018   PROT 6.6 05/05/2019   ALBUMIN 4.1 05/05/2019   LABGLOB 2.1 07/03/2017   AGRATIO 2.0 07/03/2017   BILITOT 1.3 (H) 05/05/2019   ALKPHOS 57 05/05/2019   AST 27 05/05/2019   ALT 27 05/05/2019   ANIONGAP 10 05/05/2019        Objective:    There were no vitals taken for this visit. BP Readings from Last 3 Encounters:  05/05/19 127/77  05/05/19 125/88  02/27/19 138/78   Wt Readings from Last 3 Encounters:  05/05/19 146 lb 3.2 oz (66.3 kg)  05/05/19 145 lb (65.8 kg)  02/27/19 132 lb 7.6 oz (60.1 kg)      Physical Exam Vitals reviewed.  Constitutional:      Appearance: She is well-developed.  HENT:     Head: Normocephalic and atraumatic.     Right Ear: External ear normal.     Left Ear: External ear normal.     Nose: Nose normal.  Eyes:     General: No scleral icterus.    Conjunctiva/sclera: Conjunctivae normal.  Neck:     Thyroid: No thyromegaly.  Cardiovascular:     Rate and Rhythm: Normal rate and regular rhythm.     Heart sounds: Normal heart sounds.  Pulmonary:     Effort: Pulmonary effort is normal.     Breath sounds: Normal breath sounds.  Abdominal:     Palpations: Abdomen is soft.  Skin:    General: Skin is warm and dry.  Neurological:     General: No focal deficit present.     Mental Status:  She is alert and oriented to person, place, and time.  Psychiatric:        Mood and Affect: Mood normal.        Behavior: Behavior normal.        Thought Content: Thought content normal.        Judgment: Judgment normal.       Results for orders placed or performed in visit on 05/05/19  Lactate dehydrogenase  Result Value Ref Range   LDH 150 98 - 192 U/L  Comprehensive metabolic panel  Result Value Ref Range   Sodium 139 135 - 145 mmol/L   Potassium 3.5 3.5 - 5.1 mmol/L   Chloride 102 98 - 111 mmol/L   CO2 27 22 - 32 mmol/L   Glucose, Bld 113 (H) 70 - 99 mg/dL   BUN 15 8 - 23 mg/dL  Creatinine, Ser 0.85 0.44 - 1.00 mg/dL   Calcium 9.5 8.9 - 10.3 mg/dL   Total Protein 6.6 6.5 - 8.1 g/dL   Albumin 4.1 3.5 - 5.0 g/dL   AST 27 15 - 41 U/L   ALT 27 0 - 44 U/L   Alkaline Phosphatase 57 38 - 126 U/L   Total Bilirubin 1.3 (H) 0.3 - 1.2 mg/dL   GFR calc non Af Amer >60 >60 mL/min   GFR calc Af Amer >60 >60 mL/min   Anion gap 10 5 - 15  CBC with Differential  Result Value Ref Range   WBC 2.8 (L) 4.0 - 10.5 K/uL   RBC 3.91 3.87 - 5.11 MIL/uL   Hemoglobin 12.8 12.0 - 15.0 g/dL   HCT 36.7 36.0 - 46.0 %   MCV 93.9 80.0 - 100.0 fL   MCH 32.7 26.0 - 34.0 pg   MCHC 34.9 30.0 - 36.0 g/dL   RDW 13.6 11.5 - 15.5 %   Platelets 65 (L) 150 - 400 K/uL   nRBC 0.0 0.0 - 0.2 %   Neutrophils Relative % 61 %   Neutro Abs 1.7 1.7 - 7.7 K/uL   Lymphocytes Relative 27 %   Lymphs Abs 0.8 0.7 - 4.0 K/uL   Monocytes Relative 12 %   Monocytes Absolute 0.3 0.1 - 1.0 K/uL   Eosinophils Relative 0 %   Eosinophils Absolute 0.0 0.0 - 0.5 K/uL   Basophils Relative 0 %   Basophils Absolute 0.0 0.0 - 0.1 K/uL   Immature Granulocytes 0 %   Abs Immature Granulocytes 0.01 0.00 - 0.07 K/uL      Assessment & Plan:    1. Adult hypothyroidism Has not had TSH checked since 2019.  Follow-up 6 months. - TSH  2. Coronary artery disease involving coronary bypass graft of native heart without angina  pectoris Risk factors controlled.  Last LDL 78 in this lady with B-cell lymphoma.  3. Essential (primary) hypertension Good control on HCTZ/lisinopril  4. 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 Jacinto Reap, PET scan pending  5. Personal history of surgery to heart and great vessels, presenting hazards to health   6. Hypercholesteremia   7. Chronic gout without tophus, unspecified cause, unspecified site On allopurinol.  Uric acid followed by oncology      [name], MD  [facility] [phone] (phone) [phone] (fax)  [place]

## 2019-05-05 ENCOUNTER — Encounter: Payer: Self-pay | Admitting: Internal Medicine

## 2019-05-05 ENCOUNTER — Ambulatory Visit (INDEPENDENT_AMBULATORY_CARE_PROVIDER_SITE_OTHER): Payer: Medicare Other | Admitting: Family Medicine

## 2019-05-05 ENCOUNTER — Other Ambulatory Visit: Payer: Self-pay

## 2019-05-05 ENCOUNTER — Inpatient Hospital Stay (HOSPITAL_BASED_OUTPATIENT_CLINIC_OR_DEPARTMENT_OTHER): Payer: Medicare Other | Admitting: Internal Medicine

## 2019-05-05 ENCOUNTER — Inpatient Hospital Stay: Payer: Medicare Other | Attending: Internal Medicine

## 2019-05-05 ENCOUNTER — Encounter: Payer: Self-pay | Admitting: Family Medicine

## 2019-05-05 VITALS — BP 127/77 | HR 78 | Temp 97.1°F | Ht 65.0 in | Wt 146.2 lb

## 2019-05-05 VITALS — BP 125/88 | HR 70 | Temp 96.0°F | Wt 145.0 lb

## 2019-05-05 DIAGNOSIS — C8308 Small cell B-cell lymphoma, lymph nodes of multiple sites: Secondary | ICD-10-CM | POA: Diagnosis not present

## 2019-05-05 DIAGNOSIS — Z8261 Family history of arthritis: Secondary | ICD-10-CM | POA: Diagnosis not present

## 2019-05-05 DIAGNOSIS — I10 Essential (primary) hypertension: Secondary | ICD-10-CM | POA: Insufficient documentation

## 2019-05-05 DIAGNOSIS — Z7901 Long term (current) use of anticoagulants: Secondary | ICD-10-CM | POA: Diagnosis not present

## 2019-05-05 DIAGNOSIS — D696 Thrombocytopenia, unspecified: Secondary | ICD-10-CM | POA: Insufficient documentation

## 2019-05-05 DIAGNOSIS — Z79899 Other long term (current) drug therapy: Secondary | ICD-10-CM | POA: Diagnosis not present

## 2019-05-05 DIAGNOSIS — Z801 Family history of malignant neoplasm of trachea, bronchus and lung: Secondary | ICD-10-CM | POA: Insufficient documentation

## 2019-05-05 DIAGNOSIS — Z9889 Other specified postprocedural states: Secondary | ICD-10-CM | POA: Diagnosis not present

## 2019-05-05 DIAGNOSIS — N3941 Urge incontinence: Secondary | ICD-10-CM

## 2019-05-05 DIAGNOSIS — Z8249 Family history of ischemic heart disease and other diseases of the circulatory system: Secondary | ICD-10-CM | POA: Diagnosis not present

## 2019-05-05 DIAGNOSIS — I4891 Unspecified atrial fibrillation: Secondary | ICD-10-CM | POA: Diagnosis not present

## 2019-05-05 DIAGNOSIS — E039 Hypothyroidism, unspecified: Secondary | ICD-10-CM

## 2019-05-05 DIAGNOSIS — Z85828 Personal history of other malignant neoplasm of skin: Secondary | ICD-10-CM | POA: Insufficient documentation

## 2019-05-05 DIAGNOSIS — Z803 Family history of malignant neoplasm of breast: Secondary | ICD-10-CM | POA: Insufficient documentation

## 2019-05-05 DIAGNOSIS — E7800 Pure hypercholesterolemia, unspecified: Secondary | ICD-10-CM | POA: Insufficient documentation

## 2019-05-05 DIAGNOSIS — M1A9XX0 Chronic gout, unspecified, without tophus (tophi): Secondary | ICD-10-CM | POA: Diagnosis not present

## 2019-05-05 DIAGNOSIS — I25810 Atherosclerosis of coronary artery bypass graft(s) without angina pectoris: Secondary | ICD-10-CM

## 2019-05-05 DIAGNOSIS — Z90710 Acquired absence of both cervix and uterus: Secondary | ICD-10-CM | POA: Insufficient documentation

## 2019-05-05 DIAGNOSIS — Z833 Family history of diabetes mellitus: Secondary | ICD-10-CM | POA: Insufficient documentation

## 2019-05-05 LAB — CBC WITH DIFFERENTIAL/PLATELET
Abs Immature Granulocytes: 0.01 10*3/uL (ref 0.00–0.07)
Basophils Absolute: 0 10*3/uL (ref 0.0–0.1)
Basophils Relative: 0 %
Eosinophils Absolute: 0 10*3/uL (ref 0.0–0.5)
Eosinophils Relative: 0 %
HCT: 36.7 % (ref 36.0–46.0)
Hemoglobin: 12.8 g/dL (ref 12.0–15.0)
Immature Granulocytes: 0 %
Lymphocytes Relative: 27 %
Lymphs Abs: 0.8 10*3/uL (ref 0.7–4.0)
MCH: 32.7 pg (ref 26.0–34.0)
MCHC: 34.9 g/dL (ref 30.0–36.0)
MCV: 93.9 fL (ref 80.0–100.0)
Monocytes Absolute: 0.3 10*3/uL (ref 0.1–1.0)
Monocytes Relative: 12 %
Neutro Abs: 1.7 10*3/uL (ref 1.7–7.7)
Neutrophils Relative %: 61 %
Platelets: 65 10*3/uL — ABNORMAL LOW (ref 150–400)
RBC: 3.91 MIL/uL (ref 3.87–5.11)
RDW: 13.6 % (ref 11.5–15.5)
WBC: 2.8 10*3/uL — ABNORMAL LOW (ref 4.0–10.5)
nRBC: 0 % (ref 0.0–0.2)

## 2019-05-05 LAB — COMPREHENSIVE METABOLIC PANEL
ALT: 27 U/L (ref 0–44)
AST: 27 U/L (ref 15–41)
Albumin: 4.1 g/dL (ref 3.5–5.0)
Alkaline Phosphatase: 57 U/L (ref 38–126)
Anion gap: 10 (ref 5–15)
BUN: 15 mg/dL (ref 8–23)
CO2: 27 mmol/L (ref 22–32)
Calcium: 9.5 mg/dL (ref 8.9–10.3)
Chloride: 102 mmol/L (ref 98–111)
Creatinine, Ser: 0.85 mg/dL (ref 0.44–1.00)
GFR calc Af Amer: >60 mL/min (ref >60)
GFR calc non Af Amer: >60 mL/min (ref >60)
Glucose, Bld: 113 mg/dL — ABNORMAL HIGH (ref 70–99)
Potassium: 3.5 mmol/L (ref 3.5–5.1)
Sodium: 139 mmol/L (ref 135–145)
Total Bilirubin: 1.3 mg/dL — ABNORMAL HIGH (ref 0.3–1.2)
Total Protein: 6.6 g/dL (ref 6.5–8.1)

## 2019-05-05 LAB — LACTATE DEHYDROGENASE: LDH: 150 U/L (ref 98–192)

## 2019-05-05 MED ORDER — OXYBUTYNIN CHLORIDE 5 MG PO TABS: 5.0000 mg | ORAL_TABLET | Freq: Two times a day (BID) | ORAL | 3 refills | Status: DC

## 2019-05-05 NOTE — Assessment & Plan Note (Signed)
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 Currently on venetoclax-March 2020. STABLE.   #Currently on Venclexta 300 mg a day; today white count is 2.8 ANC 1.7 platelets 65 hemoglobin 12 continue venetoclax to 300 mg once a day.  For now continue current therapy.  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   # A. Fib-stable continue Xarelto [Dr.Kowalski]. stable.   # DISPOSITION: # follow up in 3 months-MD: labs- cbc/cmp/ldh;CT c/a/p- Dr.B

## 2019-05-05 NOTE — Progress Notes (Signed)
Jacqueline Webb  Patient Care Team: [name], MD as PCP - General (Unknown Physician Specialty) Bary Castilla, Forest Gleason, MD (General Surgery) Corey Skains, MD as Consulting Physician (Cardiology) Anell Barr, OD as Consulting Physician (Optometry) Cammie Sickle, MD as Consulting Physician (Internal Medicine)  Cancer Staging No matching staging information was found for the patient.   Oncology History Overview Webb  1. Lymphoma low-grade, status Rituxan therapy. 2. Recurrent disease by clinical examination in December of 2012 3. Ovarian mass on PET scan in December of 2012 (normal CA 125). Ovarian mass has resolved after rituximab therapy. 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  Patient had total 5 cycles of chemotherapy with bendamustine and rituximab and 6 cycle was omitted because of significant side effects with and weakness and fatigue.  # OCT 2016- PET significant response; ON surveillance  # 18th OCT 2018- G[place];SEP 2018-/OCT 2018 CT scan- progression; s/p 6 cycles [#6 in April 2019]  # JAN 2020-CT scan shows progressive disease; start a acalabrutinib 100mg  BID;# feb 6th2020- decrease dose of acalbrutinib to 100 mg/day [sec to spon bruising] March 1st week- STOPPED [place] sec to spon brusing  # MARCH 17th 2020- Start Venetoclax [out pt ramp up]; 300 mg a day    # 11/06/2016- colonoscopy [Dr.Byrnett- 2 polyps]  #History of A. Fib-Xarelto; CKD stage II-III.   # #Right posterior neck melanoma stage I [ Dr.Lee/Elkins]..   -------------------------------------------------------------------    # left ? Adnexal mass- ? Etiology; mild SUV uptake incidental [stable compared to previous PET in 2016] Previous  workup 2015- vaginal ultrasound negative. MRI February 2018- approximately 3 cm in size; slow increase in the size of the left adnexal mass over at least 3 years- suspect involvement of lymphoma rather than a primary gynecologic malignancy. --------------------------------------------------------------   DIAGNOSIS: SMALL LYMPHOCYTIC LEUKEMIA  STAGE:  IV    ;GOALS: control/pallaitive  CURRENT/MOST RECENT THERAPY: Venatoclax.    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  Patient has not had any recent UTIs.  Patient denies any worsening swelling of the legs.  Denies any worsening shortness of breath or cough.  Mild fatigue.  No easy bruising or bleeding.  No nausea no vomiting no headaches.  Denies any worsening lumps or bumps.  Review of Systems  Constitutional: Positive for malaise/fatigue. Negative for chills, diaphoresis, fever and weight loss.  HENT: Negative for nosebleeds and sore throat.   Eyes: Negative for double vision.  Respiratory: Negative for cough, hemoptysis, sputum production, shortness of breath and wheezing.   Cardiovascular: Positive for leg swelling. Negative for chest pain, palpitations and orthopnea.  Gastrointestinal: Negative for abdominal pain, blood in stool, constipation, diarrhea, heartburn, melena and vomiting.  Genitourinary: Negative for dysuria, frequency and urgency.  Musculoskeletal: Negative for back pain and joint pain.  Neurological: Negative for dizziness, tingling, focal weakness, weakness and headaches.  Psychiatric/Behavioral: Negative for depression. The patient is not nervous/anxious and does not have insomnia.     PAST MEDICAL HISTORY :  Past Medical History:  Diagnosis  Date  . A-fib [facility])   . Benign neoplasm of rectum and anal canal   . 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 Dysrhythmia    A-Fib  . Hypercholesteremia   . Hypertension   . Hyperthyroidism   . Lymphoma ([place]) 2007  . Non Hodgkin's lymphoma ([place]) 01/22/2006  . Personal history of chemotherapy     PAST SURGICAL HISTORY :   Past Surgical History:  Procedure Laterality Date  . ABDOMINAL HYSTERECTOMY  1958  . APPENDECTOMY    . BREAST BIOPSY Right 1970   right breast biopsy with clip- neg  . BREAST BIOPSY Left 2002   left breast biopsy with clip-neg  . BREAST BIOPSY Right 2014   right breast biopsy with clip-neg  . BREAST CYST ASPIRATION Bilateral 2000   bilateral fine needle aspiration  . BREAST SURGERY Left 1990   biopsy  . BREAST SURGERY Right May 08, 2012   complex fibroadenoma without malignancy.  [name] CARDIAC CATHETERIZATION  2014  . COLONOSCOPY  2009, 2012    hyperplastic rectal polyp 2012 tubular adenoma of proximal ascending colon 2000 9 repeat exam due to 2017.  [name] COLONOSCOPY WITH PROPOFOL N/A 11/06/2016   Procedure: COLONOSCOPY WITH PROPOFOL;  Surgeon: Robert Bellow, MD;  Location: [facility] ENDOSCOPY;  Service: Endoscopy;  Laterality: N/A;  . COLONOSCOPY WITH PROPOFOL N/A 02/27/2019   Procedure: COLONOSCOPY WITH PROPOFOL;  Surgeon: Robert Bellow, MD;  Location: [facility] ENDOSCOPY;  Service: General;  Laterality: N/A;  . CORONARY ARTERY BYPASS GRAFT  1999  . FLEXIBLE SIGMOIDOSCOPY  1998    FAMILY HISTORY :   Family History  Problem Relation Age of Onset  . Lung cancer Father        Died age 56  . Heart disease Mother        Died age 98  . Asthma Mother   . CAD Brother        double bypass  . Heart attack Sister   . Tuberculosis Sister        15 years old  . Lung cancer Sister        Died in her 67s  . Angina Sister   . Breast cancer Sister 29  . Rheum arthritis Sister   . Aneurysm Sister        Brain,  died age 71  . Angina Sister   . Heart disease Brother        Stent placement  . Heart disease Brother        stent placement  . Cancer Other        breast cancer niece, pancreatic cancer niece  . Breast cancer Other   . Ovarian cancer Neg Hx   . Diabetes Neg Hx     SOCIAL HISTORY:   Social History   Tobacco Use  . Smoking status: Never Smoker  . Smokeless tobacco: Never Used  Substance Use Topics  . Alcohol use: Yes    Alcohol/week: 4.0 standard drinks    Types: 1 Shots of liquor, 3 Glasses of wine per week  . Drug use: No    ALLERGIES:  is allergic to pravastatin.  MEDICATIONS:  Current Outpatient Medications  Medication Sig Dispense Refill  . allopurinol (ZYLOPRIM) 300 MG tablet TAKE 1 TABLET(300 MG) BY MOUTH TWICE DAILY 180 tablet 3  . Docusate Calcium (STOOL SOFTENER PO) Take by mouth as needed.    . ezetimibe (ZETIA) 10 MG tablet Take 1 tablet (  10 mg total) daily by mouth. 30 tablet 12  . isosorbide mononitrate (IMDUR) 30 MG 24 hr tablet     . levothyroxine (SYNTHROID) 88 MCG tablet TAKE 1 TABLET BY MOUTH  DAILY BEFORE BREAKFAST 90 tablet 3  . lisinopril-hydrochlorothiazide (ZESTORETIC) 20-12.5 MG tablet TAKE 1 TABLET BY MOUTH  DAILY 90 tablet 1  . Melatonin 1 MG CAPS Take 1 capsule by mouth at bedtime.     . metoprolol succinate (TOPROL-XL) 25 MG 24 hr tablet Take 25 mg by mouth daily.     . nitrofurantoin, macrocrystal-monohydrate, (MACROBID) 100 MG capsule Take 1 capsule (100 mg total) by mouth 2 (two) times daily. 20 capsule 3  . ondansetron (ZOFRAN) 4 MG tablet Take 1 tablet (4 mg total) by mouth every 8 (eight) hours as needed for nausea or vomiting. 40 tablet 3  . oxybutynin (DITROPAN) 5 MG tablet Take 1 tablet (5 mg total) by mouth 2 (two) times daily. 60 tablet 11  . rosuvastatin (CRESTOR) 5 MG tablet Take 5 mg by mouth in the morning, at noon, and at bedtime.    . VENCLEXTA 100 MG TABS TAKE 3 TABLETS BY MOUTH DAILY 90 tablet 2  . XARELTO 20 MG TABS tablet  Take 20 mg by mouth daily with supper.      No current facility-administered medications for this visit.   Facility-Administered Medications Ordered in Other Visits  Medication Dose Route Frequency Provider Last Rate Last Admin  . heparin lock flush 100 unit/mL  500 Units Intravenous Once Charlaine Dalton R, MD      . sodium chloride flush (NS) 0.9 % injection 10 mL  10 mL Intravenous PRN Cammie Sickle, MD        PHYSICAL EXAMINATION: ECOG PERFORMANCE STATUS: 1 - Symptomatic but completely ambulatory  BP 125/88 (BP Location: Left Arm, Patient Position: Sitting, Cuff Size: Normal)   Pulse 70   Temp (!) 96 F (35.6 C) (Tympanic)   Wt 145 lb (65.8 kg)   BMI 24.13 kg/m   Filed Weights   05/05/19 1002  Weight: 145 lb (65.8 kg)    Physical Exam  Constitutional: She is oriented to person, place, and time and well-developed, well-nourished, and in no distress.  HENT:  Head: Normocephalic and atraumatic.  Mouth/Throat: Oropharynx is clear and moist. No oropharyngeal exudate.  Approximately 1 cm lymph node felt in the right axilla.  None on the left.  Eyes: Pupils are equal, round, and reactive to light.  Cardiovascular: Normal rate and regular rhythm.  Pulmonary/Chest: No respiratory distress. She has no wheezes.  Abdominal: Soft. Bowel sounds are normal. She exhibits no distension and no mass. There is no abdominal tenderness. There is no rebound and no guarding.  Musculoskeletal:        General: Edema present. No tenderness. Normal range of motion.     Cervical back: Normal range of motion and neck supple.  Neurological: She is alert and oriented to person, place, and time.  Skin: Skin is warm.  Psychiatric: Affect normal.    LABORATORY DATA:  I have reviewed the data as listed    Component Value Date/Time   NA 139 05/05/2019 0948   NA 139 07/03/2017 1425   NA 141 05/11/2014 0853   K 3.5 05/05/2019 0948   K 3.6 05/11/2014 0853   CL 102 05/05/2019 0948   CL  103 05/11/2014 0853   CO2 27 05/05/2019 0948   CO2 29 05/11/2014 0853   GLUCOSE 113 (H) 05/05/2019 [id number]  GLUCOSE 107 (H) 05/11/2014 0853   BUN 15 05/05/2019 0948   BUN 19 07/03/2017 1425   BUN 22 (H) 05/11/2014 0853   CREATININE 0.85 05/05/2019 0948   CREATININE 1.07 (H) 05/11/2014 0853   CALCIUM 9.5 05/05/2019 0948   CALCIUM 10.1 05/11/2014 0853   PROT 6.6 05/05/2019 0948   PROT 6.4 07/03/2017 1425   PROT 7.3 05/11/2014 0853   ALBUMIN 4.1 05/05/2019 0948   ALBUMIN 4.3 07/03/2017 1425   ALBUMIN 4.7 05/11/2014 0853   AST 27 05/05/2019 0948   AST 29 05/11/2014 0853   ALT 27 05/05/2019 0948   ALT 20 05/11/2014 0853   ALKPHOS 57 05/05/2019 0948   ALKPHOS 47 05/11/2014 0853   BILITOT 1.3 (H) 05/05/2019 0948   BILITOT 0.5 07/03/2017 1425   BILITOT 1.0 05/11/2014 0853   GFRNONAA >60 05/05/2019 0948   GFRNONAA 51 (L) 05/11/2014 0853   GFRAA >60 05/05/2019 0948   GFRAA 59 (L) 05/11/2014 0853    No results found for: SPEP, UPEP  Lab Results  Component Value Date   WBC 2.8 (L) 05/05/2019   NEUTROABS 1.7 05/05/2019   HGB 12.8 05/05/2019   HCT 36.7 05/05/2019   MCV 93.9 05/05/2019   PLT 65 (L) 05/05/2019      Chemistry      Component Value Date/Time   NA 139 05/05/2019 0948   NA 139 07/03/2017 1425   NA 141 05/11/2014 0853   K 3.5 05/05/2019 0948   K 3.6 05/11/2014 0853   CL 102 05/05/2019 0948   CL 103 05/11/2014 0853   CO2 27 05/05/2019 0948   CO2 29 05/11/2014 0853   BUN 15 05/05/2019 0948   BUN 19 07/03/2017 1425   BUN 22 (H) 05/11/2014 0853   CREATININE 0.85 05/05/2019 0948   CREATININE 1.07 (H) 05/11/2014 0853   GLU 98 05/25/2013 0000      Component Value Date/Time   CALCIUM 9.5 05/05/2019 0948   CALCIUM 10.1 05/11/2014 0853   ALKPHOS 57 05/05/2019 0948   ALKPHOS 47 05/11/2014 0853   AST 27 05/05/2019 0948   AST 29 05/11/2014 0853   ALT 27 05/05/2019 0948   ALT 20 05/11/2014 0853   BILITOT 1.3 (H) 05/05/2019 0948   BILITOT 0.5 07/03/2017 1425    BILITOT 1.0 05/11/2014 0853       RADIOGRAPHIC STUDIES: I have personally reviewed the radiological images as listed and agreed with the findings in the report. No results found.   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 Currently on venetoclax-March 2020. STABLE.   #Currently on Venclexta 300 mg a day; today white count is 2.8 ANC 1.7 platelets 65 hemoglobin 12 continue venetoclax to 300 mg once a day.  For now continue current therapy.  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   # A. Fib-stable continue Xarelto [Dr.Kowalski]. stable.   # DISPOSITION: # follow up in 3 months-MD: labs- cbc/cmp/ldh;CT c/a/p- Dr.B     Orders Placed This Encounter  Procedures  . CT Chest W Contrast    Standing Status:   Future    Standing Expiration Date:   05/04/2020    Order Specific Question:   ** REASON FOR EXAM (FREE TEXT)    Answer:   CLL    Order Specific Question:  If indicated for the ordered procedure, I authorize the administration of contrast media per Radiology protocol    Answer:   Yes    Order Specific Question:   Preferred imaging location?    Answer:   [facility]    Order Specific Question:   Radiology Contrast Protocol - do NOT remove file path    Answer:   [email or web address]  . CT Abdomen Pelvis W Contrast    Standing Status:   Future    Standing Expiration Date:   05/04/2020    Order Specific Question:   ** REASON FOR EXAM (FREE TEXT)    Answer:   CLL    Order Specific Question:   If indicated for the ordered procedure, I authorize the administration of contrast media per Radiology protocol    Answer:   Yes    Order Specific Question:   Preferred imaging  location?    Answer:   [facility]    Order Specific Question:   Is Oral Contrast requested for this exam?    Answer:   Yes, Per Radiology protocol    Order Specific Question:   Radiology Contrast Protocol - do NOT remove file path    Answer:   \\[email or web address]  . CBC with Differential    Standing Status:   Future    Standing Expiration Date:   05/04/2020  . Comprehensive metabolic panel    Standing Status:   Future    Standing Expiration Date:   05/04/2020  . Lactate dehydrogenase    Standing Status:   Future    Standing Expiration Date:   05/04/2020   All questions were answered. The patient knows to call the clinic with any problems, questions or concerns.      Cammie Sickle, MD 05/05/2019 12:52 PM

## 2019-05-06 DIAGNOSIS — E039 Hypothyroidism, unspecified: Secondary | ICD-10-CM | POA: Diagnosis not present

## 2019-05-07 LAB — TSH: TSH: 0.608 u[IU]/mL (ref 0.450–4.500)

## 2019-05-08 ENCOUNTER — Telehealth: Payer: Self-pay | Admitting: *Deleted

## 2019-05-08 NOTE — Telephone Encounter (Signed)
LMOVM for pt to return call. Okay for [facility] triage to give pt results. 

## 2019-05-08 NOTE — Telephone Encounter (Signed)
-----   Message from [name], MD sent at 05/08/2019  9:28 AM EDT ----- Thyroid in good range.

## 2019-05-11 NOTE — Telephone Encounter (Signed)
Reviewed physician's note regarding her thyroid results with the patient. No further questions.

## 2019-05-13 MED FILL — VENCLEXTA 100 MG TABS: 100 | 30 days supply | Qty: 90 | Fill #1

## 2019-06-09 ENCOUNTER — Other Ambulatory Visit: Payer: Self-pay | Admitting: Family Medicine

## 2019-06-09 NOTE — Telephone Encounter (Signed)
Requested Prescriptions  Pending Prescriptions Disp Refills  . levothyroxine (SYNTHROID) 88 MCG tablet [Pharmacy Med Name: LEVOTHYROXINE  88MCG  TAB] 90 tablet 3    Sig: TAKE 1 TABLET BY MOUTH  DAILY BEFORE BREAKFAST     Endocrinology:  Hypothyroid Agents Failed - 06/09/2019  5:00 AM      Failed - TSH needs to be rechecked within 3 months after an abnormal result. Refill until TSH is due.      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 lisinopril-hydrochlorothiazide (ZESTORETIC) 20-12.5 MG tablet [Pharmacy Med Name: LISINOPRIL/HCTZ 20-12.5MG  TABLET] 90 tablet 3    Sig: TAKE 1 TABLET BY MOUTH  DAILY     Cardiovascular:  ACEI + Diuretic Combos Passed - 06/09/2019  5:00 AM      Passed - Na in normal range and within 180 days    Sodium  Date Value Ref Range Status  05/05/2019 139 135 - 145 mmol/L Final  07/03/2017 139 134 - 144 mmol/L Final  05/11/2014 141 mmol/L Final    Comment:    135-145 NOTE: New Reference Range  03/30/14          Passed - K in normal range and within 180 days    Potassium  Date Value Ref Range  Status  05/05/2019 3.5 3.5 - 5.1 mmol/L Final  05/11/2014 3.6 mmol/L Final    Comment:    3.5-5.1 NOTE: New Reference Range  03/30/14          Passed - Cr in normal range and within 180 days    Creatinine  Date Value Ref Range Status  05/11/2014 1.07 (H) mg/dL Final    Comment:    0.44-1.00 NOTE: New Reference Range  03/30/14    Creatinine, Ser  Date Value Ref Range Status  05/05/2019 0.85 0.44 - 1.00 mg/dL Final         Passed - Ca in normal range and within 180 days    Calcium  Date Value Ref Range Status  05/05/2019 9.5 8.9 - 10.3 mg/dL Final   Calcium, Total  Date Value Ref Range Status  05/11/2014 10.1 mg/dL Final    Comment:    8.9-10.3 NOTE: New Reference Range  03/30/14          Passed - Patient is not pregnant      Passed - Last BP in normal  range    BP Readings from Last 1 Encounters:  05/05/19 127/77         Passed - Valid encounter within last 6 months    Recent Outpatient Visits          1 month ago Adult hypothyroidism   [facility] [name], MD   6 months ago 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

## 2019-06-10 IMAGING — CT CT ABD-PELV W/ CM
1 of 3 series · 13 of 32 positions shown, 18 images · IV contrast (iopamidol)
Comparison: MRI 03/16/2016 and PET CT on 04/09/2013

CLINICAL DATA: Followup left adnexal mass. Small cell B-cell
lymphoma.

EXAM:
CT ABDOMEN AND PELVIS WITH CONTRAST
TECHNIQUE: Multidetector CT imaging of the abdomen and pelvis was performed
using the standard protocol following bolus administration of
intravenous contrast.
CONTRAST:  80mL [id number] IOPAMIDOL ([id number]) INJECTION 61%

[Series 2: axial st · axial · 0.69mm/px · z∈[-944,-534]mm · 13 of 92 slices shown, 18 images]
[im 5/92  soft-tissue]
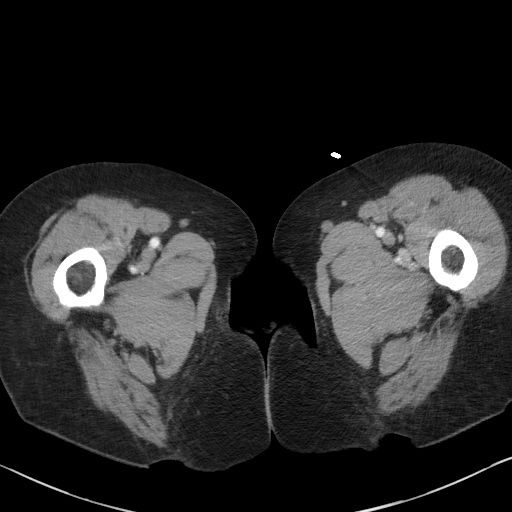
[im 5/92  bone]
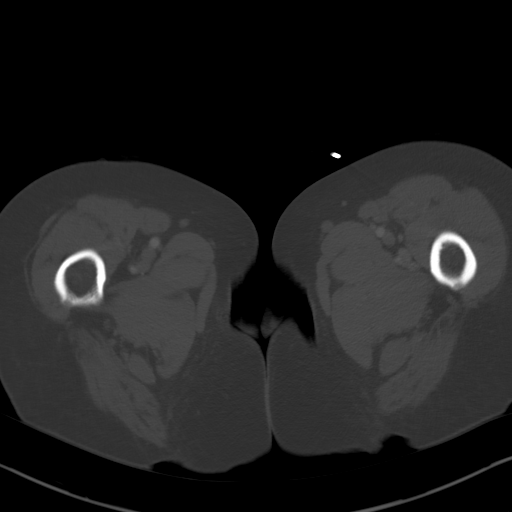
[im 15/92  soft-tissue]
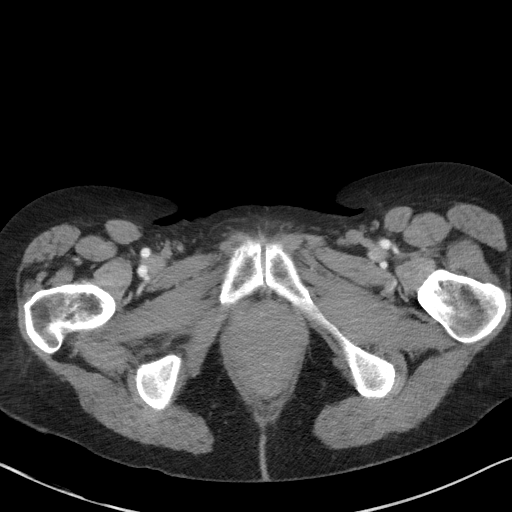
[im 20/92  soft-tissue]
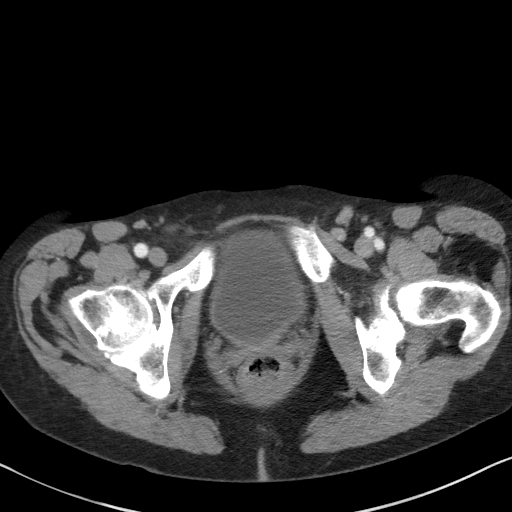
[im 29/92  soft-tissue]
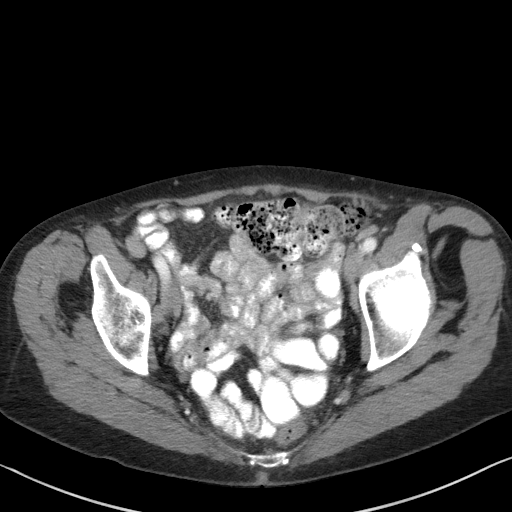
[im 34/92  soft-tissue]
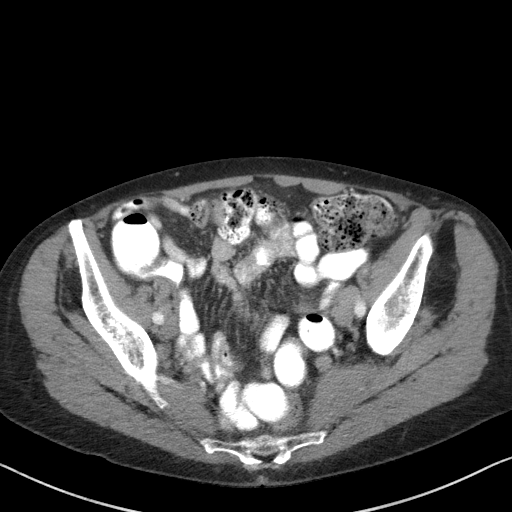
[im 44/92  soft-tissue]
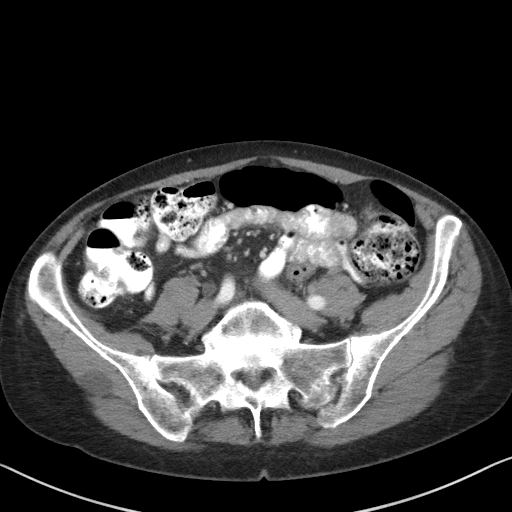
[im 48/92  soft-tissue]
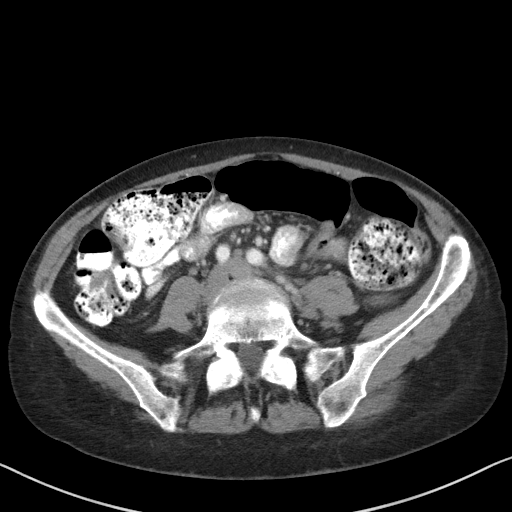
[im 58/92  soft-tissue]
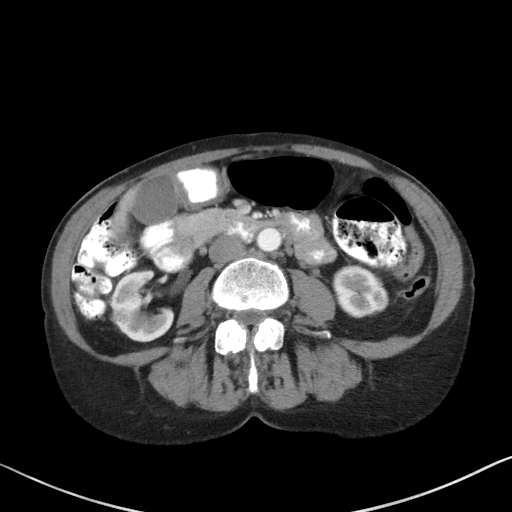
[im 63/92  soft-tissue]
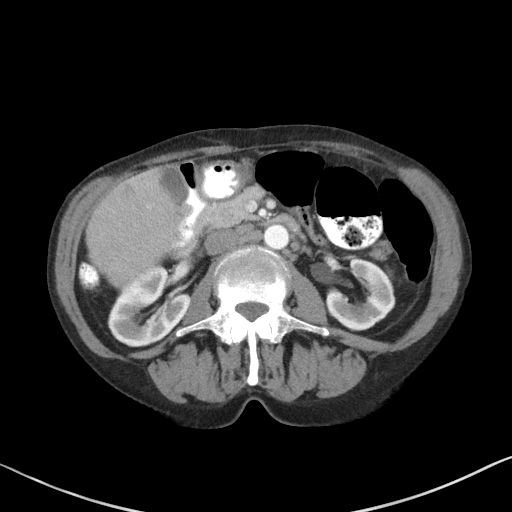
[im 63/92  bone]
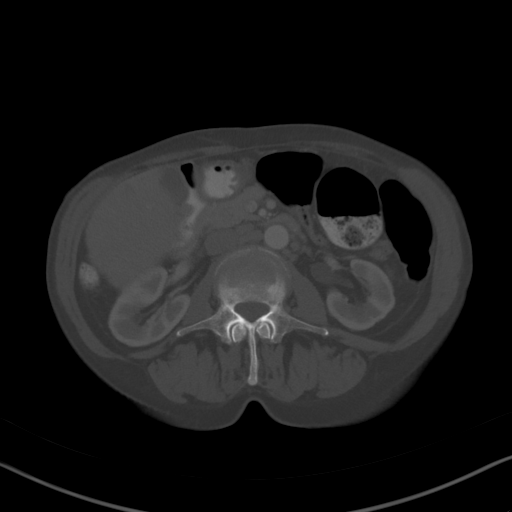
[im 72/92  soft-tissue]
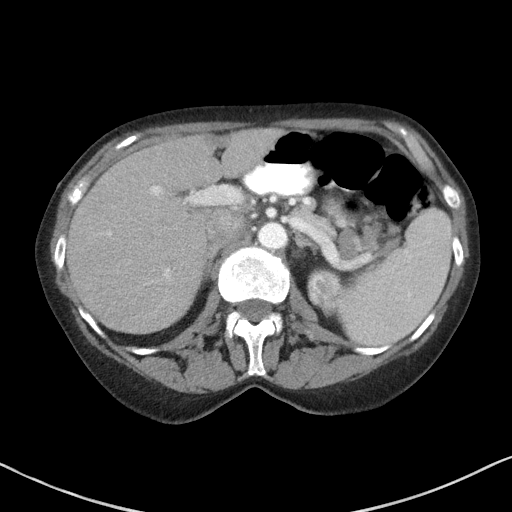
[im 72/92  lung]
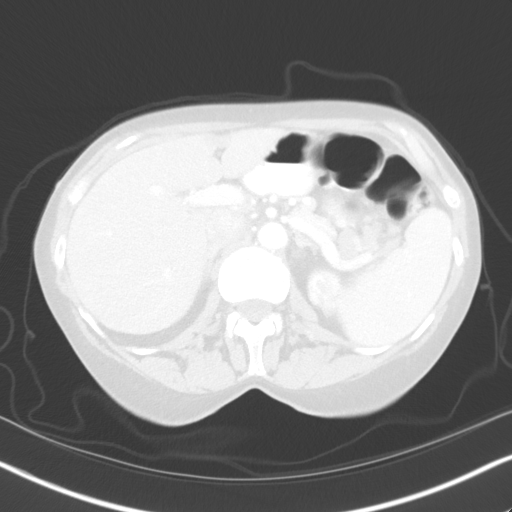
[im 77/92  soft-tissue]
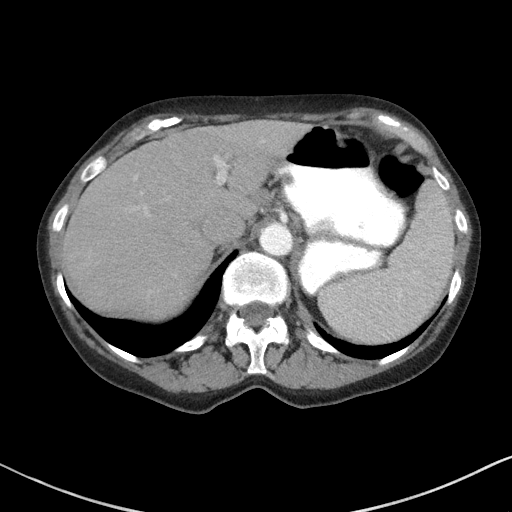
[im 77/92  lung]
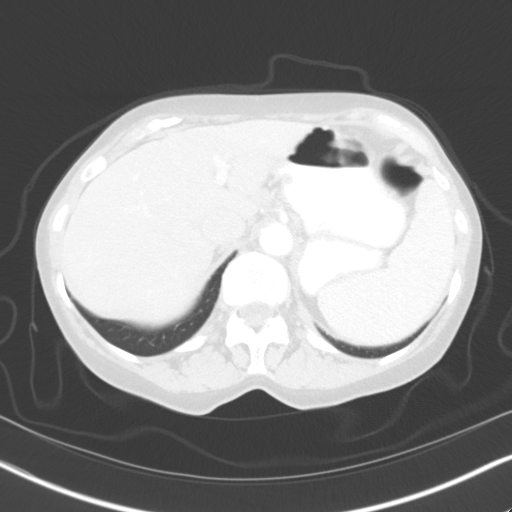
[im 82/92  lung]
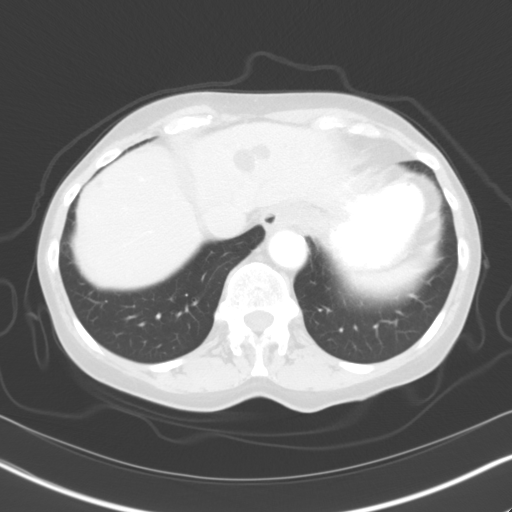
[im 87/92  soft-tissue]
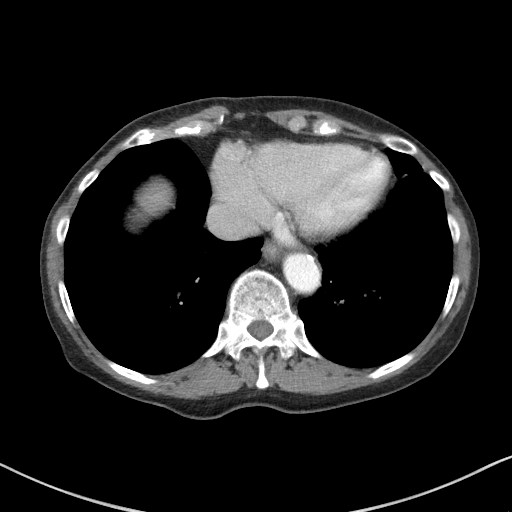
[im 87/92  lung]
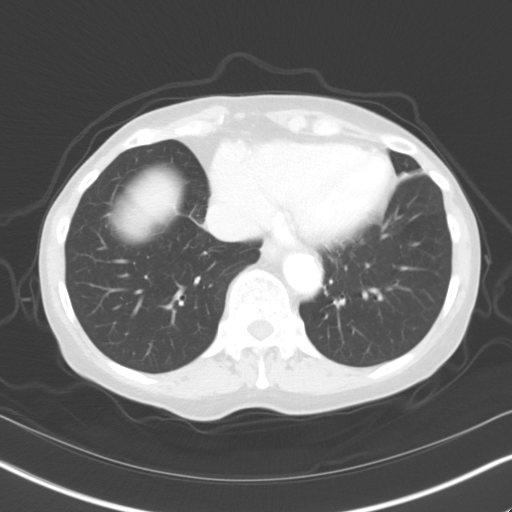

[13 of 32 positions shown; findings below may reference images not displayed]

FINDINGS: Lower Chest: No acute findings.

Hepatobiliary: No hepatic masses identified. Small hepatic cysts
again noted. Gallbladder is unremarkable.

Pancreas:  No mass or inflammatory changes.

Spleen: Within normal limits in size and appearance.

Adrenals/Urinary Tract: No masses identified. Tiny bilateral renal
cysts noted. No evidence of hydronephrosis. Unremarkable unopacified
urinary bladder.

Stomach/Bowel: Short small bowel intussusception seen in the left
lower quadrant. No evidence of bowel obstruction or inflammatory
process.

Vascular/Lymphatic: Shotty less than 1 cm abdominal retroperitoneal
lymph nodes in the aortocaval and left paraaortic spaces show no
significant change. Mild left upper quadrant lymphadenopathy in the
gastrosplenic ligament measures up to 15 mm in short axis on image
[DATE], and appears increased since previous studies. Small left
external iliac lymphadenopathy measuring short axis on image 57/2 is
new since previous study in 9116, and mildly increased in size
compared to 12 mm on more recent MRI.

Reproductive: Prior hysterectomy noted. Left adnexal mass currently
measures 3.4 x 3.5 cm on image 55/ 2. This is not significantly
changed compared to MRI on 03/16/2016 but is increased in size
compared to previous PET-CT in 9116 when it measured 3.0 x 3.0 cm.

Other:  None.

Musculoskeletal:  No suspicious bone lesions identified.
IMPRESSION: Mild increase in left upper quadrant and left external iliac
lymphadenopathy, suspicious for recurrent lymphoma. Mild increase in
size of left adnexal mass since 9116, also suspicious for lymphoma
with ovarian mass considered less likely. Consider PET-CT scan for
further evaluation.

Short small bowel intussusception in left lower quadrant, which may
be transient in nature although small-bowel neoplasm cannot be
excluded. Recommend followup with abdomen pelvis CT enterography in
1-2 months.

## 2019-06-11 MED FILL — VENCLEXTA 100 MG TABS: 100 | 30 days supply | Qty: 90 | Fill #2

## 2019-06-19 ENCOUNTER — Telehealth: Payer: Self-pay | Admitting: *Deleted

## 2019-06-19 NOTE — Telephone Encounter (Signed)
Patient called reporting that she is having problems and needs to be seen sooner than her scheduled appointment. Please advise

## 2019-06-23 ENCOUNTER — Telehealth: Payer: Self-pay | Admitting: Internal Medicine

## 2019-06-23 NOTE — Telephone Encounter (Signed)
On 6/1- spoke pt re: Vaginal bleeding x2; no gums bleeding/nose bleeds.   HOLD  Venatoclax for now; get labs; recommend g[place] app[place]  # H/T- pt needs cbc/cmp/Pt/PTTwith [facility] locally [5 mins from pts home; please order;and make sure the results sent over to us]

## 2019-06-23 NOTE — Telephone Encounter (Signed)
[facility] corp orders faxed to [facility] in [place], [place]

## 2019-06-23 NOTE — Telephone Encounter (Signed)
Patient called again this morning reporting that she is having vaginal bleeding twice in the past week, not sure if it is internal or external. She is asking if she should have her scan and doctor appointments moved up. Please advise. She would like a return call before noon today if possible due to having appointment this afternoon

## 2019-06-23 NOTE — Telephone Encounter (Signed)
On 6/1- spoke to pt re: vaginal bleeding x2 [previous TAH]; no gums/ bleeds

## 2019-06-24 ENCOUNTER — Telehealth: Payer: Self-pay | Admitting: *Deleted

## 2019-06-24 DIAGNOSIS — N939 Abnormal uterine and vaginal bleeding, unspecified: Secondary | ICD-10-CM | POA: Diagnosis not present

## 2019-06-24 DIAGNOSIS — D696 Thrombocytopenia, unspecified: Secondary | ICD-10-CM | POA: Diagnosis not present

## 2019-06-24 DIAGNOSIS — C8308 Small cell B-cell lymphoma, lymph nodes of multiple sites: Secondary | ICD-10-CM | POA: Diagnosis not present

## 2019-06-24 DIAGNOSIS — Z9010 Acquired absence of unspecified breast and nipple: Secondary | ICD-10-CM | POA: Diagnosis not present

## 2019-06-24 NOTE — Telephone Encounter (Signed)
Contacted [facility] to obtain test results. Patient did not have the labs drawn until midday today. No results available at this time and expected to result later this evening.

## 2019-06-25 ENCOUNTER — Encounter: Payer: Self-pay | Admitting: Internal Medicine

## 2019-06-25 ENCOUNTER — Telehealth: Payer: Self-pay | Admitting: Internal Medicine

## 2019-06-25 NOTE — Telephone Encounter (Signed)
On 6/03-I spoke to patient regarding results of her blood work; fairly unremarkable/overall stable white count 2.3; hemoglobin normal 12.3; platelets 89; PT PTT slightly abnormal-clinically unremarkable.  Patient has appointment with gynecology next week/in [place].  Okay to continue taking venetoclax at this time. She will call us if any concerns from gynecologist office.  Follow-up with [name] as planned.  FYI.

## 2019-07-02 ENCOUNTER — Other Ambulatory Visit: Payer: Self-pay | Admitting: Internal Medicine

## 2019-07-02 DIAGNOSIS — C8308 Small cell B-cell lymphoma, lymph nodes of multiple sites: Secondary | ICD-10-CM

## 2019-07-08 ENCOUNTER — Encounter: Payer: Self-pay | Admitting: Obstetrics and Gynecology

## 2019-07-08 ENCOUNTER — Ambulatory Visit (INDEPENDENT_AMBULATORY_CARE_PROVIDER_SITE_OTHER): Payer: Medicare Other | Admitting: Obstetrics and Gynecology

## 2019-07-08 ENCOUNTER — Other Ambulatory Visit: Payer: Self-pay

## 2019-07-08 VITALS — BP 130/70 | HR 77 | Resp 16 | Ht 64.0 in | Wt 144.0 lb

## 2019-07-08 DIAGNOSIS — N905 Atrophy of vulva: Secondary | ICD-10-CM

## 2019-07-08 NOTE — Progress Notes (Signed)
Patient ID: Jacqueline Webb, female   DOB: 15-May-1940, 79 y.o.   MRN: [id number]  Reason for Consult: Gynecologic Exam   Referred by [name],*  Subjective:     HPI:  Jacqueline Webb is a 79 y.o. female she presents today with complaints of a small amount of bright red blood that she saw after her shower several weeks ago.  She reports that she needed a pad for short amount of time because of the bleeding.  She reports that it happened again 1 week later.  She believes that the bleeding is coming from the right side of her vulva.  She does not believe that this is any internal bleeding but rather external superficial bleeding.  She reports that she has been having some stomach cramps and bloating.  She is followed for non-Hodgkin lymphoma.  She reports that she recently started a new medication has and has been doing well on it taking 300 mg a day.  She reports that her mammogram is up-to-date as of February 4 and that her colonoscopy is up-to-date as of February 5.  She reports that on July 13 she has a CT scan planned for her abdomen/pelvis.    She notes that she has a history of a total abdominal hysterectomy at 27-26 for heavy menstrual bleeding and fibroids.  She has a history of 2 vaginal deliveries.  She was seen in the past for urinary urgency and incontinence.  Her symptoms are controlled with taking oxybutynin daily.  Past Medical History:  Diagnosis Date  . A-fib ([place])   . Benign neoplasm of rectum and anal canal   . 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 Dysrhythmia    A-Fib  . Hypercholesteremia   . Hypertension   . Hyperthyroidism   . Lymphoma ([place]) 2007  . Non Hodgkin's lymphoma ([place]) 01/22/2006  . Personal history of chemotherapy    Family History  Problem Relation Age of Onset  . Lung cancer Father        Died age 8  . Heart disease Mother        Died age 7  . Asthma Mother   . CAD Brother         double bypass  . Heart attack Sister   . Tuberculosis Sister        70 years old  . Lung cancer Sister        Died in her 55s  . Angina Sister   . Breast cancer Sister 41  . Rheum arthritis Sister   . Aneurysm Sister        Brain, died age 11  . Angina Sister   . Heart disease Brother        Stent placement  . Heart disease Brother        stent placement  . Cancer Other        breast cancer niece, pancreatic cancer niece  . Breast cancer Other   . Ovarian cancer Neg Hx   . Diabetes Neg Hx    Past Surgical History:  Procedure Laterality Date  . ABDOMINAL HYSTERECTOMY  1958  . APPENDECTOMY    . BREAST BIOPSY Right 1970   right breast biopsy with clip- neg  . BREAST BIOPSY Left 2002   left breast biopsy with clip-neg  . BREAST BIOPSY Right 2014   right breast biopsy with clip-neg  . BREAST CYST ASPIRATION Bilateral 2000  bilateral fine needle aspiration  . BREAST SURGERY Left 1990   biopsy  . BREAST SURGERY Right May 08, 2012   complex fibroadenoma without malignancy.  [name] CARDIAC CATHETERIZATION  2014  . COLONOSCOPY  2009, 2012    hyperplastic rectal polyp 2012 tubular adenoma of proximal ascending colon 2000 9 repeat exam due to 2017.  [name] COLONOSCOPY WITH PROPOFOL N/A 11/06/2016   Procedure: COLONOSCOPY WITH PROPOFOL;  Surgeon: Robert Bellow, MD;  Location: [facility] ENDOSCOPY;  Service: Endoscopy;  Laterality: N/A;  . COLONOSCOPY WITH PROPOFOL N/A 02/27/2019   Procedure: COLONOSCOPY WITH PROPOFOL;  Surgeon: Robert Bellow, MD;  Location: [facility];  Service: General;  Laterality: N/A;  . CORONARY ARTERY BYPASS GRAFT  1999  . [place]    Short Social History:  Social History   Tobacco Use  . Smoking status: Never Smoker  . Smokeless tobacco: Never Used  Substance Use Topics  . Alcohol use: Yes    Alcohol/week: 4.0 standard drinks    Types: 1 Shots of liquor, 3 Glasses of wine per week    Allergies  Allergen Reactions  .  Pravastatin Other (See Comments)    Current Outpatient Medications  Medication Sig Dispense Refill  . allopurinol (ZYLOPRIM) 300 MG tablet TAKE 1 TABLET(300 MG) BY MOUTH TWICE DAILY 180 tablet 3  . Docusate Calcium (STOOL SOFTENER PO) Take by mouth as needed.    . ezetimibe (ZETIA) 10 MG tablet Take 1 tablet (10 mg total) daily by mouth. 30 tablet 12  . isosorbide mononitrate (IMDUR) 30 MG 24 hr tablet     . levothyroxine (SYNTHROID) 88 MCG tablet TAKE 1 TABLET BY MOUTH  DAILY BEFORE BREAKFAST 90 tablet 3  . lisinopril-hydrochlorothiazide (ZESTORETIC) 20-12.5 MG tablet TAKE 1 TABLET BY MOUTH  DAILY 90 tablet 1  . Melatonin 1 MG CAPS Take 1 capsule by mouth at bedtime.     . metoprolol succinate (TOPROL-XL) 25 MG 24 hr tablet Take 25 mg by mouth daily.     . nitrofurantoin, macrocrystal-monohydrate, (MACROBID) 100 MG capsule Take 1 capsule (100 mg total) by mouth 2 (two) times daily. 20 capsule 3  . ondansetron (ZOFRAN) 4 MG tablet Take 1 tablet (4 mg total) by mouth every 8 (eight) hours as needed for nausea or vomiting. 40 tablet 3  . oxybutynin (DITROPAN) 5 MG tablet Take 1 tablet (5 mg total) by mouth 2 (two) times daily. 180 tablet 3  . rosuvastatin (CRESTOR) 5 MG tablet Take 5 mg by mouth in the morning, at noon, and at bedtime.    . VENCLEXTA 100 MG TABS TAKE 3 TABLETS BY MOUTH DAILY 90 tablet 2  . XARELTO 20 MG TABS tablet Take 20 mg by mouth daily with supper.      No current facility-administered medications for this visit.   Facility-Administered Medications Ordered in Other Visits  Medication Dose Route Frequency Provider Last Rate Last Admin  . heparin lock flush 100 unit/mL  500 Units Intravenous Once Charlaine Dalton R, MD      . sodium chloride flush (NS) 0.9 % injection 10 mL  10 mL Intravenous PRN Cammie Sickle, MD        Review of Systems  Constitutional: Negative for chills, fatigue, fever and unexpected weight change.  HENT: Negative for trouble  swallowing.  Eyes: Negative for loss of vision.  Respiratory: Negative for cough, shortness of breath and wheezing.  Cardiovascular: Negative for chest pain, leg swelling, palpitations and syncope.  GI: Negative  for abdominal pain, blood in stool, diarrhea, nausea and vomiting.  GU: Negative for difficulty urinating, dysuria, frequency and hematuria.  Musculoskeletal: Negative for back pain, leg pain and joint pain.  Skin: Negative for rash.  Neurological: Negative for dizziness, headaches, light-headedness, numbness and seizures.  Psychiatric: Negative for behavioral problem, confusion, depressed mood and sleep disturbance.        Objective:  Objective   Vitals:   07/08/19 1019  BP: 130/70  Pulse: 77  Resp: 16  SpO2: 98%  Weight: 144 lb (65.3 kg)  Height: 5\' 4"  (1.626 m)   Body mass index is 24.72 kg/m.  Physical Exam Vitals and nursing note reviewed.  Constitutional:      Appearance: She is well-developed.  HENT:     Head: Normocephalic and atraumatic.  Eyes:     Pupils: Pupils are equal, round, and reactive to light.  Cardiovascular:     Rate and Rhythm: Normal rate and regular rhythm.  Pulmonary:     Effort: Pulmonary effort is normal. No respiratory distress.  Genitourinary:      Comments: External: Normal appearing vulva. Two small petechiae. No lesions noted.  Speculum examination: Normal appearing vaginal cuff. No blood in the vaginal vault. Scant discharge.  Bimanual examination: Uterus absent  No adnexal masses. No adnexal tenderness. Pelvis not fixed.  Skin:    General: Skin is warm and dry.  Neurological:     Mental Status: She is alert and oriented to person, place, and time.  Psychiatric:        Behavior: Behavior normal.        Thought Content: Thought content normal.        Judgment: Judgment normal.         Assessment/Plan:     79 yo with vulvar bleeding Small vulvar petechiae. Appearance not consistent with lichen sclerosis at this  time. Otherwise normal tissue. Likely related to atrophy and age. Reassurance regarding bleeding Follow up annually for monitoring. Return if any concern for further bleeding. Discussed supportive perineal care.  More than 30 minutes were spent face to face with the patient in the room, reviewing the medical record, labs and images, and coordinating care for the patient. The plan of management was discussed in detail and counseling was provided.    Adrian Prows MD [facility], [facility] 07/08/2019 11:14 AM

## 2019-07-14 MED FILL — VENCLEXTA 100 MG TABS: 100 | 30 days supply | Qty: 90 | Fill #0

## 2019-08-04 ENCOUNTER — Other Ambulatory Visit: Payer: Self-pay

## 2019-08-04 ENCOUNTER — Ambulatory Visit
Admission: RE | Admit: 2019-08-04 | Discharge: 2019-08-04 | Disposition: A | Discharge status: Home / Self Care | Payer: Medicare Other | Source: Ambulatory Visit | Attending: Internal Medicine | Admitting: Internal Medicine

## 2019-08-04 DIAGNOSIS — I700 Atherosclerosis of aorta: Secondary | ICD-10-CM | POA: Diagnosis not present

## 2019-08-04 DIAGNOSIS — C8300 Small cell B-cell lymphoma, unspecified site: Secondary | ICD-10-CM | POA: Diagnosis not present

## 2019-08-04 DIAGNOSIS — M47816 Spondylosis without myelopathy or radiculopathy, lumbar region: Secondary | ICD-10-CM | POA: Diagnosis not present

## 2019-08-04 DIAGNOSIS — C8308 Small cell B-cell lymphoma, lymph nodes of multiple sites: Secondary | ICD-10-CM | POA: Insufficient documentation

## 2019-08-04 DIAGNOSIS — M48061 Spinal stenosis, lumbar region without neurogenic claudication: Secondary | ICD-10-CM | POA: Diagnosis not present

## 2019-08-04 LAB — POCT I-STAT CREATININE: Creatinine, Ser: 0.9 mg/dL (ref 0.44–1.00)

## 2019-08-04 MED ORDER — IOHEXOL 300 MG/ML  SOLN
100.0000 mL | Freq: Once | INTRAMUSCULAR | Status: AC | PRN
  Administered 2019-08-04: 100 mL via INTRAVENOUS

## 2019-08-05 ENCOUNTER — Inpatient Hospital Stay (HOSPITAL_BASED_OUTPATIENT_CLINIC_OR_DEPARTMENT_OTHER): Payer: Medicare Other | Admitting: Internal Medicine

## 2019-08-05 ENCOUNTER — Inpatient Hospital Stay: Payer: Medicare Other | Attending: Internal Medicine

## 2019-08-05 DIAGNOSIS — Z79899 Other long term (current) drug therapy: Secondary | ICD-10-CM | POA: Diagnosis not present

## 2019-08-05 DIAGNOSIS — Z8249 Family history of ischemic heart disease and other diseases of the circulatory system: Secondary | ICD-10-CM | POA: Insufficient documentation

## 2019-08-05 DIAGNOSIS — D696 Thrombocytopenia, unspecified: Secondary | ICD-10-CM | POA: Insufficient documentation

## 2019-08-05 DIAGNOSIS — Z801 Family history of malignant neoplasm of trachea, bronchus and lung: Secondary | ICD-10-CM | POA: Diagnosis not present

## 2019-08-05 DIAGNOSIS — R110 Nausea: Secondary | ICD-10-CM | POA: Diagnosis not present

## 2019-08-05 DIAGNOSIS — Z8261 Family history of arthritis: Secondary | ICD-10-CM | POA: Insufficient documentation

## 2019-08-05 DIAGNOSIS — I119 Hypertensive heart disease without heart failure: Secondary | ICD-10-CM | POA: Diagnosis not present

## 2019-08-05 DIAGNOSIS — I25810 Atherosclerosis of coronary artery bypass graft(s) without angina pectoris: Secondary | ICD-10-CM

## 2019-08-05 DIAGNOSIS — E7800 Pure hypercholesterolemia, unspecified: Secondary | ICD-10-CM | POA: Insufficient documentation

## 2019-08-05 DIAGNOSIS — I4891 Unspecified atrial fibrillation: Secondary | ICD-10-CM | POA: Diagnosis not present

## 2019-08-05 DIAGNOSIS — C8308 Small cell B-cell lymphoma, lymph nodes of multiple sites: Secondary | ICD-10-CM

## 2019-08-05 DIAGNOSIS — Z803 Family history of malignant neoplasm of breast: Secondary | ICD-10-CM | POA: Diagnosis not present

## 2019-08-05 DIAGNOSIS — Z9221 Personal history of antineoplastic chemotherapy: Secondary | ICD-10-CM | POA: Insufficient documentation

## 2019-08-05 DIAGNOSIS — Z90710 Acquired absence of both cervix and uterus: Secondary | ICD-10-CM | POA: Diagnosis not present

## 2019-08-05 DIAGNOSIS — Z7901 Long term (current) use of anticoagulants: Secondary | ICD-10-CM | POA: Diagnosis not present

## 2019-08-05 LAB — COMPREHENSIVE METABOLIC PANEL
ALT: 24 U/L (ref 0–44)
AST: 33 U/L (ref 15–41)
Albumin: 4.1 g/dL (ref 3.5–5.0)
Alkaline Phosphatase: 68 U/L (ref 38–126)
Anion gap: 10 (ref 5–15)
BUN: 14 mg/dL (ref 8–23)
CO2: 27 mmol/L (ref 22–32)
Calcium: 9.4 mg/dL (ref 8.9–10.3)
Chloride: 101 mmol/L (ref 98–111)
Creatinine, Ser: 1.02 mg/dL — ABNORMAL HIGH (ref 0.44–1.00)
GFR calc Af Amer: >60 mL/min (ref >60)
GFR calc non Af Amer: 52 mL/min — ABNORMAL LOW (ref >60)
Glucose, Bld: 108 mg/dL — ABNORMAL HIGH (ref 70–99)
Potassium: 3.8 mmol/L (ref 3.5–5.1)
Sodium: 138 mmol/L (ref 135–145)
Total Bilirubin: 1 mg/dL (ref 0.3–1.2)
Total Protein: 6.8 g/dL (ref 6.5–8.1)

## 2019-08-05 LAB — CBC WITH DIFFERENTIAL/PLATELET
Abs Immature Granulocytes: 0.01 10*3/uL (ref 0.00–0.07)
Basophils Absolute: 0 10*3/uL (ref 0.0–0.1)
Basophils Relative: 1 %
Eosinophils Absolute: 0 10*3/uL (ref 0.0–0.5)
Eosinophils Relative: 0 %
HCT: 36.4 % (ref 36.0–46.0)
Hemoglobin: 13 g/dL (ref 12.0–15.0)
Immature Granulocytes: 0 %
Lymphocytes Relative: 29 %
Lymphs Abs: 0.8 10*3/uL (ref 0.7–4.0)
MCH: 32.8 pg (ref 26.0–34.0)
MCHC: 35.7 g/dL (ref 30.0–36.0)
MCV: 91.9 fL (ref 80.0–100.0)
Monocytes Absolute: 0.4 10*3/uL (ref 0.1–1.0)
Monocytes Relative: 15 %
Neutro Abs: 1.4 10*3/uL — ABNORMAL LOW (ref 1.7–7.7)
Neutrophils Relative %: 55 %
Platelets: 77 10*3/uL — ABNORMAL LOW (ref 150–400)
RBC: 3.96 MIL/uL (ref 3.87–5.11)
RDW: 13.9 % (ref 11.5–15.5)
WBC: 2.6 10*3/uL — ABNORMAL LOW (ref 4.0–10.5)
nRBC: 0 % (ref 0.0–0.2)

## 2019-08-05 LAB — LACTATE DEHYDROGENASE: LDH: 167 U/L (ref 98–192)

## 2019-08-05 MED ORDER — ONDANSETRON HCL 4 MG PO TABS: 4.0000 mg | ORAL_TABLET | Freq: Three times a day (TID) | ORAL | 3 refills | Status: DC | PRN

## 2019-08-05 NOTE — Assessment & Plan Note (Addendum)
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 Currently on venetoclax-[March 2020.]. STABLE.   #Currently on Venclexta 300 mg a day; today white count is 2.8 ANC 1.5 platelets 77 hemoglobin 13 continue venetoclax to 300 mg once a day.  For now continue current therapy.  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   # A. Fib-stable continue Xarelto [Dr.Kowalski]. STABLE.  # Vaginal bleeding- ? Etiology s/p eva with with [place] side, Gynecology.     # DISPOSITION: # follow up in 3 months-MD: labs- cbc/cmp/ldh; Dr.B  # I reviewed the blood work- with the patient in detail; also reviewed the imaging independently [as summarized above]; and with the patient in detail.

## 2019-08-05 NOTE — Progress Notes (Signed)
[facility] OFFICE PROGRESS NOTE  Patient Care Team: [name], MD as PCP - General (Unknown Physician Specialty) Bary Castilla, Forest Gleason, MD (General Surgery) Corey Skains, MD as Consulting Physician (Cardiology) Anell Barr, OD as Consulting Physician (Optometry) Cammie Sickle, MD as Consulting Physician (Internal Medicine)  Cancer Staging No matching staging information was found for the patient.   Oncology History Overview Note  1. Lymphoma low-grade, status Rituxan therapy. 2. Recurrent disease by clinical examination in December of 2012 3. Ovarian mass on PET scan in December of 2012 (normal CA 125). Ovarian mass has resolved after rituximab therapy. 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  Patient had total 5 cycles of chemotherapy with bendamustine and rituximab and 6 cycle was omitted because of significant side effects with and weakness and fatigue.  # OCT 2016- PET significant response; ON surveillance  # 18th OCT 2018- [place];SEP 2018-/OCT 2018 CT scan- progression; s/p 6 cycles [#6 in April 2019]  # JAN 2020-CT scan shows progressive disease; start a acalabrutinib 100mg  BID;# feb 6th2020- decrease dose of acalbrutinib to 100 mg/day [sec to spon bruising] March 1st week- STOPPED [place] sec to spon brusing  # MARCH 17th 2020- Start Venetoclax [out pt ramp up]; 300 mg a day    # 11/06/2016- colonoscopy [Dr.Byrnett- 2 polyps]  #History of A. Fib-Xarelto; CKD stage II-III.   # #Right posterior neck melanoma stage I [ Dr.Lee/Elkins]..   -------------------------------------------------------------------    # left ? Adnexal mass- ? Etiology; mild SUV uptake incidental [stable compared to previous PET in 2016] Previous  workup 2015- vaginal ultrasound negative. MRI February 2018- approximately 3 cm in size; slow increase in the size of the left adnexal mass over at least 3 years- suspect involvement of lymphoma rather than a primary gynecologic malignancy. --------------------------------------------------------------   DIAGNOSIS: SMALL LYMPHOCYTIC LEUKEMIA  STAGE:  IV    ;GOALS: control/pallaitive  CURRENT/MOST RECENT THERAPY: Venatoclax.    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  In the interim patient was evaluated by gynecology for vaginal bleeding.  Negative for any acute process.  Patient denies any new lumps or bumps.  Appetite is good.  No weight loss.  Denies any worsening swelling in legs.   Review of Systems  Constitutional: Positive for malaise/fatigue. Negative for chills, diaphoresis, fever and weight loss.  HENT: Negative for nosebleeds and sore throat.   Eyes: Negative for double vision.  Respiratory: Negative for cough, hemoptysis, sputum production, shortness of breath and wheezing.   Cardiovascular: Positive for leg swelling. Negative for chest pain, palpitations and orthopnea.  Gastrointestinal: Negative for abdominal pain, blood in stool, constipation, diarrhea, heartburn, melena and vomiting.  Genitourinary: Negative for dysuria, frequency and urgency.  Musculoskeletal: Negative for back pain and joint pain.  Neurological: Negative for dizziness, tingling, focal weakness, weakness and headaches.  Psychiatric/Behavioral: Negative for depression. The patient is not nervous/anxious and does not have insomnia.     PAST MEDICAL HISTORY :  Past Medical History:  Diagnosis Date  . A-fib ([place])   . Benign neoplasm of rectum and anal canal   .  Chronic leukemia of unspecified  cell type, without mention of having achieved remission   . Dysrhythmia    A-Fib  . Hypercholesteremia   . Hypertension   . Hyperthyroidism   . Lymphoma ([place]) 2007  . Non Hodgkin's lymphoma ([place]) 01/22/2006  . Personal history of chemotherapy     PAST SURGICAL HISTORY :   Past Surgical History:  Procedure Laterality Date  . ABDOMINAL HYSTERECTOMY  1958  . APPENDECTOMY    . BREAST BIOPSY Right 1970   right breast biopsy with clip- neg  . BREAST BIOPSY Left 2002   left breast biopsy with clip-neg  . BREAST BIOPSY Right 2014   right breast biopsy with clip-neg  . BREAST CYST ASPIRATION Bilateral 2000   bilateral fine needle aspiration  . BREAST SURGERY Left 1990   biopsy  . BREAST SURGERY Right May 08, 2012   complex fibroadenoma without malignancy.  [name] CARDIAC CATHETERIZATION  2014  . COLONOSCOPY  2009, 2012    hyperplastic rectal polyp 2012 tubular adenoma of proximal ascending colon 2000 9 repeat exam due to 2017.  [name] COLONOSCOPY WITH PROPOFOL N/A 11/06/2016   Procedure: COLONOSCOPY WITH PROPOFOL;  Surgeon: Robert Bellow, MD;  Location: [facility] ENDOSCOPY;  Service: Endoscopy;  Laterality: N/A;  . COLONOSCOPY WITH PROPOFOL N/A 02/27/2019   Procedure: COLONOSCOPY WITH PROPOFOL;  Surgeon: Robert Bellow, MD;  Location: [facility] ENDOSCOPY;  Service: General;  Laterality: N/A;  . CORONARY ARTERY BYPASS GRAFT  1999  . FLEXIBLE SIGMOIDOSCOPY  1998    FAMILY HISTORY :   Family History  Problem Relation Age of Onset  . Lung cancer Father        Died age 32  . Heart disease Mother        Died age 61  . Asthma Mother   . CAD Brother        double bypass  . Heart attack Sister   . Tuberculosis Sister        16 years old  . Lung cancer Sister        Died in her 67s  . Angina Sister   . Breast cancer Sister 6  . Rheum arthritis Sister   . Aneurysm Sister        Brain, died age 41  . Angina Sister   . Heart disease Brother        Stent placement  . Heart disease  Brother        stent placement  . Cancer Other        breast cancer niece, pancreatic cancer niece  . Breast cancer Other   . Ovarian cancer Neg Hx   . Diabetes Neg Hx     SOCIAL HISTORY:   Social History   Tobacco Use  . Smoking status: Never Smoker  . Smokeless tobacco: Never Used  Vaping Use  . Vaping Use: Never used  Substance Use Topics  . Alcohol use: Yes    Alcohol/week: 4.0 standard drinks    Types: 1 Shots of liquor, 3 Glasses of wine per week  . Drug use: No    ALLERGIES:  is allergic to pravastatin.  MEDICATIONS:  Current Outpatient Medications  Medication Sig Dispense Refill  . ezetimibe (ZETIA) 10 MG tablet Take 1 tablet (10 mg total) daily by mouth. 30 tablet 12  . isosorbide mononitrate (IMDUR) 30 MG 24 hr tablet     . levothyroxine (SYNTHROID) 88 MCG tablet TAKE 1 TABLET BY MOUTH  DAILY BEFORE BREAKFAST 90 tablet  3  . lisinopril-hydrochlorothiazide (ZESTORETIC) 20-12.5 MG tablet TAKE 1 TABLET BY MOUTH  DAILY 90 tablet 1  . Melatonin 1 MG CAPS Take 1 capsule by mouth at bedtime.     . metoprolol succinate (TOPROL-XL) 25 MG 24 hr tablet Take 25 mg by mouth daily.     . ondansetron (ZOFRAN) 4 MG tablet Take 1 tablet (4 mg total) by mouth every 8 (eight) hours as needed for nausea or vomiting. 40 tablet 3  . oxybutynin (DITROPAN) 5 MG tablet Take 1 tablet (5 mg total) by mouth 2 (two) times daily. 180 tablet 3  . rosuvastatin (CRESTOR) 5 MG tablet Take 5 mg by mouth in the morning, at noon, and at bedtime.    . VENCLEXTA 100 MG TABS TAKE 3 TABLETS BY MOUTH DAILY 90 tablet 2  . XARELTO 20 MG TABS tablet Take 20 mg by mouth daily with supper.     [name] allopurinol (ZYLOPRIM) 300 MG tablet TAKE 1 TABLET(300 MG) BY MOUTH TWICE DAILY (Patient not taking: Reported on 08/05/2019) 180 tablet 3  . Docusate Calcium (STOOL SOFTENER PO) Take by mouth as needed. (Patient not taking: Reported on 08/05/2019)    . nitrofurantoin, macrocrystal-monohydrate, (MACROBID) 100 MG capsule  Take 1 capsule (100 mg total) by mouth 2 (two) times daily. (Patient not taking: Reported on 08/05/2019) 20 capsule 3   No current facility-administered medications for this visit.   Facility-Administered Medications Ordered in Other Visits  Medication Dose Route Frequency Provider Last Rate Last Admin  . heparin lock flush 100 unit/mL  500 Units Intravenous Once Charlaine Dalton R, MD      . sodium chloride flush (NS) 0.9 % injection 10 mL  10 mL Intravenous PRN Cammie Sickle, MD        PHYSICAL EXAMINATION: ECOG PERFORMANCE STATUS: 1 - Symptomatic but completely ambulatory  BP 105/66 (BP Location: Left Arm, Patient Position: Sitting)   Pulse 64   Temp (!) 96.1 F (35.6 C) (Tympanic)   Resp 18   Wt 143 lb (64.9 kg)   SpO2 100%   BMI 24.55 kg/m   Filed Weights   08/05/19 1043  Weight: 143 lb (64.9 kg)    Physical Exam Constitutional:      Comments: Walking independently.  Accompanied by her husband.  HENT:     Head: Normocephalic and atraumatic.     Mouth/Throat:     Pharynx: No oropharyngeal exudate.  Eyes:     Pupils: Pupils are equal, round, and reactive to light.  Cardiovascular:     Rate and Rhythm: Normal rate and regular rhythm.  Pulmonary:     Effort: No respiratory distress.     Breath sounds: No wheezing.  Abdominal:     General: Bowel sounds are normal. There is no distension.     Palpations: Abdomen is soft. There is no mass.     Tenderness: There is no abdominal tenderness. There is no guarding or rebound.  Musculoskeletal:        General: No tenderness. Normal range of motion.     Cervical back: Normal range of motion and neck supple.  Skin:    General: Skin is warm.  Neurological:     Mental Status: She is alert and oriented to person, place, and time.  Psychiatric:        Mood and Affect: Affect normal.     LABORATORY DATA:  I have reviewed the data as listed    Component Value Date/Time   NA 138  08/05/2019 0941   NA 139  07/03/2017 1425   NA 141 05/11/2014 0853   K 3.8 08/05/2019 0941   K 3.6 05/11/2014 0853   CL 101 08/05/2019 0941   CL 103 05/11/2014 0853   CO2 27 08/05/2019 0941   CO2 29 05/11/2014 0853   GLUCOSE 108 (H) 08/05/2019 0941   GLUCOSE 107 (H) 05/11/2014 0853   BUN 14 08/05/2019 0941   BUN 19 07/03/2017 1425   BUN 22 (H) 05/11/2014 0853   CREATININE 1.02 (H) 08/05/2019 0941   CREATININE 1.07 (H) 05/11/2014 0853   CALCIUM 9.4 08/05/2019 0941   CALCIUM 10.1 05/11/2014 0853   PROT 6.8 08/05/2019 0941   PROT 6.4 07/03/2017 1425   PROT 7.3 05/11/2014 0853   ALBUMIN 4.1 08/05/2019 0941   ALBUMIN 4.3 07/03/2017 1425   ALBUMIN 4.7 05/11/2014 0853   AST 33 08/05/2019 0941   AST 29 05/11/2014 0853   ALT 24 08/05/2019 0941   ALT 20 05/11/2014 0853   ALKPHOS 68 08/05/2019 0941   ALKPHOS 47 05/11/2014 0853   BILITOT 1.0 08/05/2019 0941   BILITOT 0.5 07/03/2017 1425   BILITOT 1.0 05/11/2014 0853   GFRNONAA 52 (L) 08/05/2019 0941   GFRNONAA 51 (L) 05/11/2014 0853   GFRAA >60 08/05/2019 0941   GFRAA 59 (L) 05/11/2014 0853    No results found for: SPEP, UPEP  Lab Results  Component Value Date   WBC 2.6 (L) 08/05/2019   NEUTROABS 1.4 (L) 08/05/2019   HGB 13.0 08/05/2019   HCT 36.4 08/05/2019   MCV 91.9 08/05/2019   PLT 77 (L) 08/05/2019      Chemistry      Component Value Date/Time   NA 138 08/05/2019 0941   NA 139 07/03/2017 1425   NA 141 05/11/2014 0853   K 3.8 08/05/2019 0941   K 3.6 05/11/2014 0853   CL 101 08/05/2019 0941   CL 103 05/11/2014 0853   CO2 27 08/05/2019 0941   CO2 29 05/11/2014 0853   BUN 14 08/05/2019 0941   BUN 19 07/03/2017 1425   BUN 22 (H) 05/11/2014 0853   CREATININE 1.02 (H) 08/05/2019 0941   CREATININE 1.07 (H) 05/11/2014 0853   GLU 98 05/25/2013 0000      Component Value Date/Time   CALCIUM 9.4 08/05/2019 0941   CALCIUM 10.1 05/11/2014 0853   ALKPHOS 68 08/05/2019 0941   ALKPHOS 47 05/11/2014 0853   AST 33 08/05/2019 0941   AST 29  05/11/2014 0853   ALT 24 08/05/2019 0941   ALT 20 05/11/2014 0853   BILITOT 1.0 08/05/2019 0941   BILITOT 0.5 07/03/2017 1425   BILITOT 1.0 05/11/2014 0853       RADIOGRAPHIC STUDIES: I have personally reviewed the radiological images as listed and agreed with the findings in the report. CT Chest W Contrast  Result Date: 08/04/2019 CLINICAL DATA:  Small B-cell lymphoma/CLL restaging EXAM: CT CHEST, ABDOMEN, AND PELVIS WITH CONTRAST TECHNIQUE: Multidetector CT imaging of the chest, abdomen and pelvis was performed following the standard protocol during bolus administration of intravenous contrast. CONTRAST:  153mL OMNIPAQUE IOHEXOL 300 MG/ML  SOLN COMPARISON:  Multiple exams, including 09/01/2018 FINDINGS: Despite efforts by the technologist and patient, motion artifact is present on today's exam and could not be eliminated. This reduces exam sensitivity and specificity. CT CHEST FINDINGS Cardiovascular: Atherosclerotic calcification of the thoracic aorta and coronary arteries with prior CABG and mild to moderate cardiomegaly. Mediastinum/Nodes: No significant thoracic adenopathy. Small amount of contrast in  the esophagus could reflect reflux or dysmotility. Lungs/Pleura: Biapical pleuroparenchymal scarring, stable. Stable chronic scarring and volume loss in the right middle lobe. Stable scarring along the upper margin of the right major fissure. Musculoskeletal: Unremarkable CT ABDOMEN PELVIS FINDINGS Hepatobiliary: Stable hepatic cysts. Initial heterogeneity in parenchymal enhancement described primarily to the early phase of contrast. No significant biliary dilatation. Contracted gallbladder. Pancreas: Unremarkable Spleen: No splenomegaly or focal splenic lesion. Adrenals/Urinary Tract: Both adrenal glands appear normal. Small hypodense renal lesions bilaterally are technically too small to characterize although statistically likely to be cysts. Urinary bladder unremarkable. Stomach/Bowel:  Unremarkable Vascular/Lymphatic: Aortoiliac atherosclerotic vascular disease. No appreciable adenopathy in the abdomen/pelvis. Reproductive: Left adnexal mass appears somewhat homogeneous measuring 4.5 by 3.6 by 3.7 cm (volume = 31 cm^3)with an internal density of 61 Hounsfield units. By my measurements this was previously 4.8 by 3.6 by 4.0 cm (volume = 36 cm^3) back on 01/20/2018., and accordingly has not enlarged. As reported on prior exams, this could reflect a fibrothecoma or similar benign lesion. The lack of progression favors a benign etiology. Uterus absent. Other: Mild chronic stranding in the perirectal space. Musculoskeletal: Along the posterior margin of the gluteus medius and near the upper margin of the gluteus maximus, an oval-shaped lesion measuring 2.2 by 1.4 cm is present on image 92/2. This is lower in density than the adjacent muscle. Back on the PET-CT of 02/22/2016 I measure this at 2.0 by 1.2 cm, roughly similar to today, and with only subtle internal metabolic activity. The lesion had very high T2 signal on prior MRI from 03/16/2016. I favor a schwannoma or neurofibroma. Moderate degenerative spurring of both hips. There is lower lumbar spondylosis and degenerative disc disease probably causing subarticular lateral recess stenosis at the L4-5 level. IMPRESSION: 1. No findings of active lymphoma. 2. 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 3. Stable scarring and volume loss in the right middle lobe. 4. Chronically stable small mass posterior to the right gluteus medius muscle, imaging characteristics favor schwannoma/neurofibroma. 5. Other imaging findings of potential clinical significance: Coronary atherosclerosis with prior CABG and mild to moderate cardiomegaly. Small amount of contrast in the esophagus could reflect reflux or dysmotility. Stable hepatic cysts. Small hypodense renal lesions bilaterally are technically too small to characterize  although statistically likely to be cysts. Moderate degenerative spurring of both hips. Lower lumbar spondylosis and degenerative disc disease probably causing subarticular lateral recess stenosis at the L4-5 level. 6. Aortic atherosclerosis. Aortic Atherosclerosis (ICD10-I70.0). Electronically Signed   By: Van Clines M.D.   On: 08/04/2019 13:57   CT Abdomen Pelvis W Contrast  Result Date: 08/04/2019 CLINICAL DATA:  Small B-cell lymphoma/CLL restaging EXAM: CT CHEST, ABDOMEN, AND PELVIS WITH CONTRAST TECHNIQUE: Multidetector CT imaging of the chest, abdomen and pelvis was performed following the standard protocol during bolus administration of intravenous contrast. CONTRAST:  111mL OMNIPAQUE IOHEXOL 300 MG/ML  SOLN COMPARISON:  Multiple exams, including 09/01/2018 FINDINGS: Despite efforts by the technologist and patient, motion artifact is present on today's exam and could not be eliminated. This reduces exam sensitivity and specificity. CT CHEST FINDINGS Cardiovascular: Atherosclerotic calcification of the thoracic aorta and coronary arteries with prior CABG and mild to moderate cardiomegaly. Mediastinum/Nodes: No significant thoracic adenopathy. Small amount of contrast in the esophagus could reflect reflux or dysmotility. Lungs/Pleura: Biapical pleuroparenchymal scarring, stable. Stable chronic scarring and volume loss in the right middle lobe. Stable scarring along the upper margin of the right major fissure. Musculoskeletal: Unremarkable CT  ABDOMEN PELVIS FINDINGS Hepatobiliary: Stable hepatic cysts. Initial heterogeneity in parenchymal enhancement described primarily to the early phase of contrast. No significant biliary dilatation. Contracted gallbladder. Pancreas: Unremarkable Spleen: No splenomegaly or focal splenic lesion. Adrenals/Urinary Tract: Both adrenal glands appear normal. Small hypodense renal lesions bilaterally are technically too small to characterize although statistically likely  to be cysts. Urinary bladder unremarkable. Stomach/Bowel: Unremarkable Vascular/Lymphatic: Aortoiliac atherosclerotic vascular disease. No appreciable adenopathy in the abdomen/pelvis. Reproductive: Left adnexal mass appears somewhat homogeneous measuring 4.5 by 3.6 by 3.7 cm (volume = 31 cm^3)with an internal density of 61 Hounsfield units. By my measurements this was previously 4.8 by 3.6 by 4.0 cm (volume = 36 cm^3) back on 01/20/2018., and accordingly has not enlarged. As reported on prior exams, this could reflect a fibrothecoma or similar benign lesion. The lack of progression favors a benign etiology. Uterus absent. Other: Mild chronic stranding in the perirectal space. Musculoskeletal: Along the posterior margin of the gluteus medius and near the upper margin of the gluteus maximus, an oval-shaped lesion measuring 2.2 by 1.4 cm is present on image 92/2. This is lower in density than the adjacent muscle. Back on the PET-CT of 02/22/2016 I measure this at 2.0 by 1.2 cm, roughly similar to today, and with only subtle internal metabolic activity. The lesion had very high T2 signal on prior MRI from 03/16/2016. I favor a schwannoma or neurofibroma. Moderate degenerative spurring of both hips. There is lower lumbar spondylosis and degenerative disc disease probably causing subarticular lateral recess stenosis at the L4-5 level. IMPRESSION: 1. No findings of active lymphoma. 2. 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 3. Stable scarring and volume loss in the right middle lobe. 4. Chronically stable small mass posterior to the right gluteus medius muscle, imaging characteristics favor schwannoma/neurofibroma. 5. Other imaging findings of potential clinical significance: Coronary atherosclerosis with prior CABG and mild to moderate cardiomegaly. Small amount of contrast in the esophagus could reflect reflux or dysmotility. Stable hepatic cysts. Small hypodense renal  lesions bilaterally are technically too small to characterize although statistically likely to be cysts. Moderate degenerative spurring of both hips. Lower lumbar spondylosis and degenerative disc disease probably causing subarticular lateral recess stenosis at the L4-5 level. 6. Aortic atherosclerosis. Aortic Atherosclerosis (ICD10-I70.0). Electronically Signed   By: Van Clines M.D.   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 Currently on venetoclax-[March 2020.]. STABLE.   #Currently on Venclexta 300 mg a day; today white count is 2.8 ANC 1.5 platelets 77 hemoglobin 13 continue venetoclax to 300 mg once a day.  For now continue current therapy.  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   # A. Fib-stable continue Xarelto [Dr.Kowalski]. STABLE.  # Vaginal bleeding- ? Etiology s/p eva with with [place] side, Gynecology.     # DISPOSITION: # follow up in 3 months-MD: labs- cbc/cmp/ldh; Dr.B  # I reviewed the blood work- with the patient in detail; also reviewed the imaging independently [as summarized above]; and with the patient in detail.       No orders of the defined types were placed in this encounter.  All questions were answered. The patient knows to call the clinic with any problems, questions or concerns.  Cammie Sickle, MD 08/05/2019 9:27 PM

## 2019-08-10 MED FILL — VENCLEXTA 100 MG TABS: 100 | 30 days supply | Qty: 90 | Fill #1

## 2019-08-12 IMAGING — CT CT ENTEROGRAPHY (ABD-PELV W/ CM)
2 of 7 series · 13 of 46 positions shown, 15 images · IV contrast (APPLIED)
Comparison: CT the abdomen and pelvis 08/20/2016.

CLINICAL DATA: 76-year-old female with history of small bowel
intussusception in the left lower quadrant. Evaluate for associated
small bowel neoplasm.

EXAM:
CT ABDOMEN AND PELVIS WITH CONTRAST (ENTEROGRAPHY)
TECHNIQUE: Multidetector CT of the abdomen and pelvis during bolus
administration of intravenous contrast. Negative oral contrast was
given.
CONTRAST:  80mL EEXL4S-V00 IOPAMIDOL (EEXL4S-V00) INJECTION 61%

[Series 3: entero thins · axial · 0.77mm/px · z∈[-942,-504]mm · 10 of 249 slices shown, 12 images]
[im 15/249  soft-tissue]
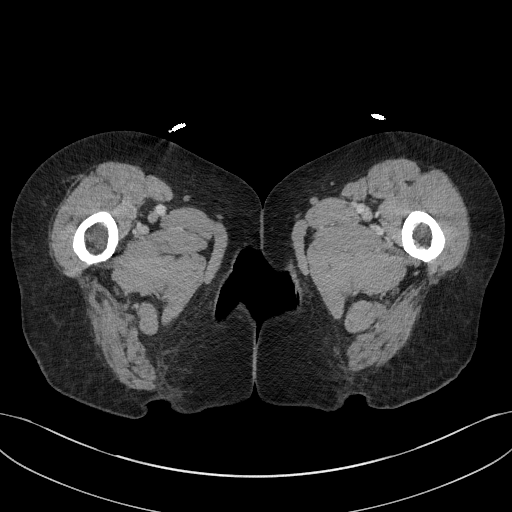
[im 15/249  bone]
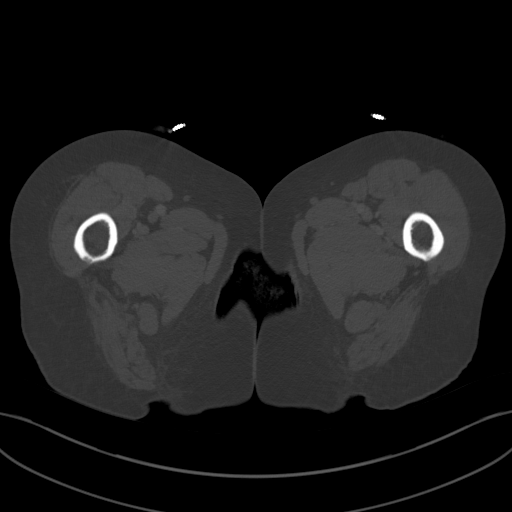
[im 44/249  soft-tissue]
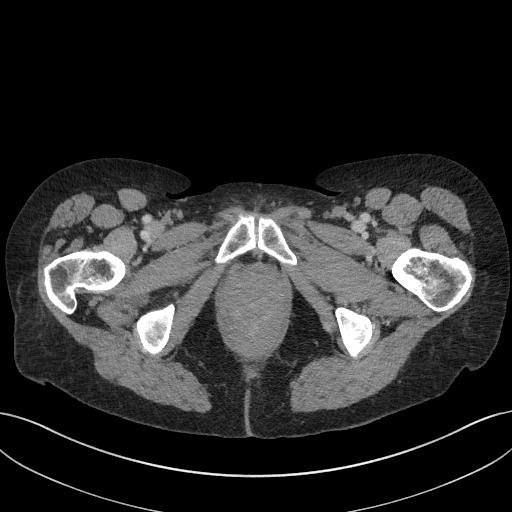
[im 73/249  soft-tissue]
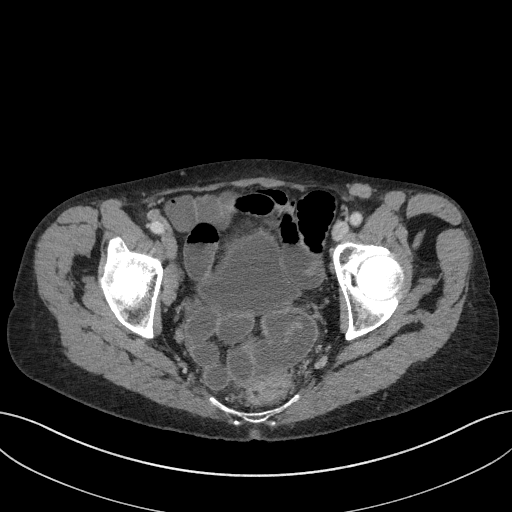
[im 88/249  soft-tissue]
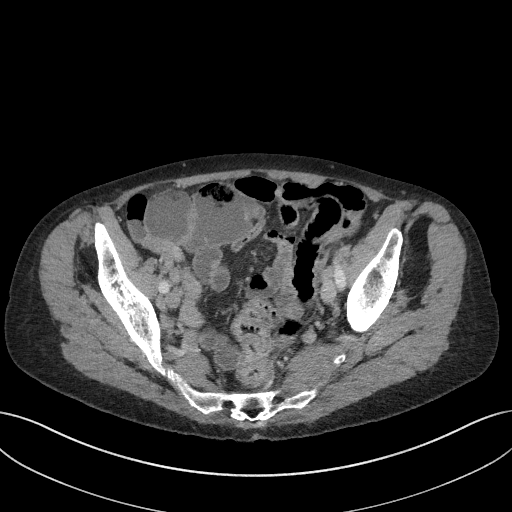
[im 117/249  soft-tissue]
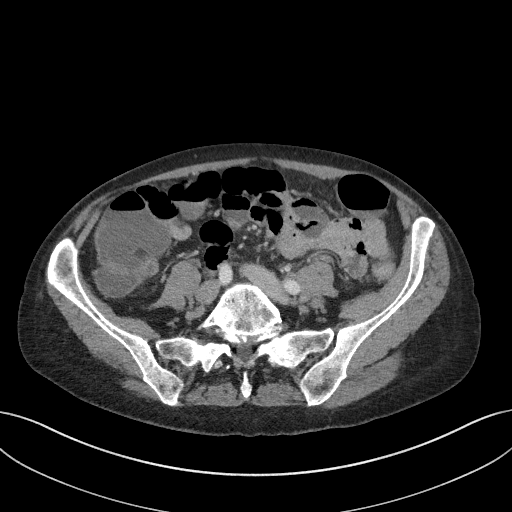
[im 132/249  soft-tissue]
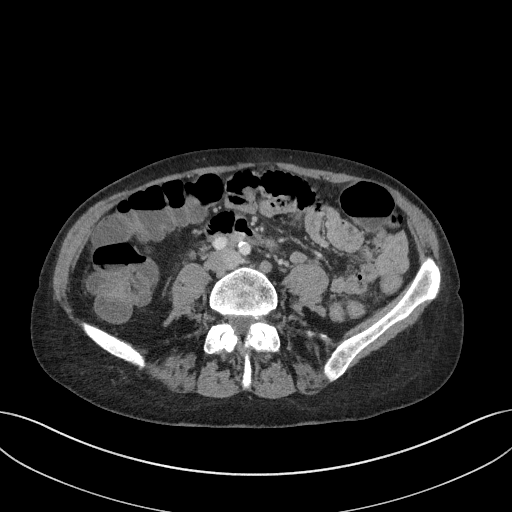
[im 161/249  soft-tissue]
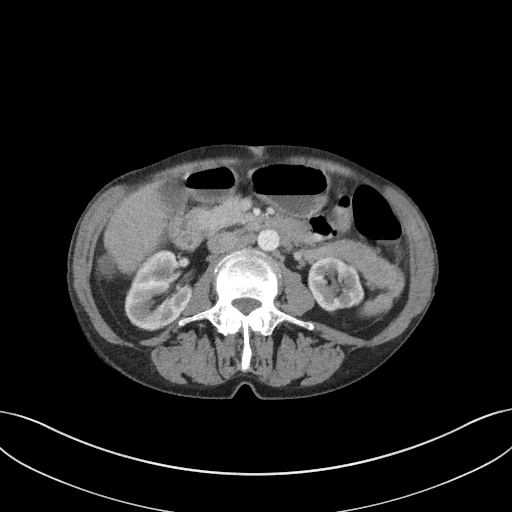
[im 190/249  soft-tissue]
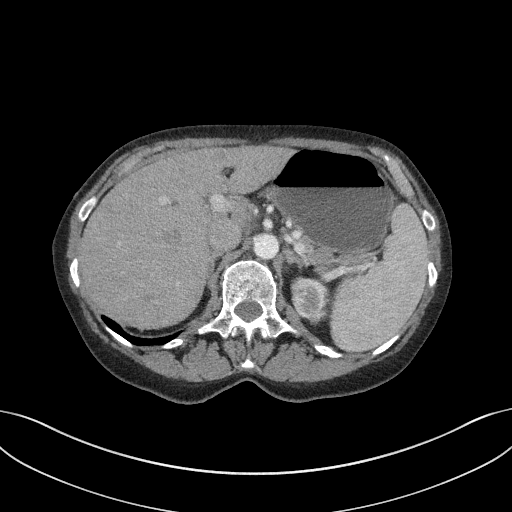
[im 205/249  soft-tissue]
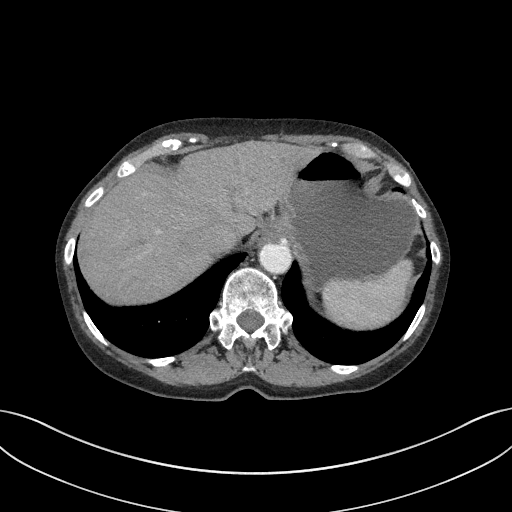
[im 205/249  bone]
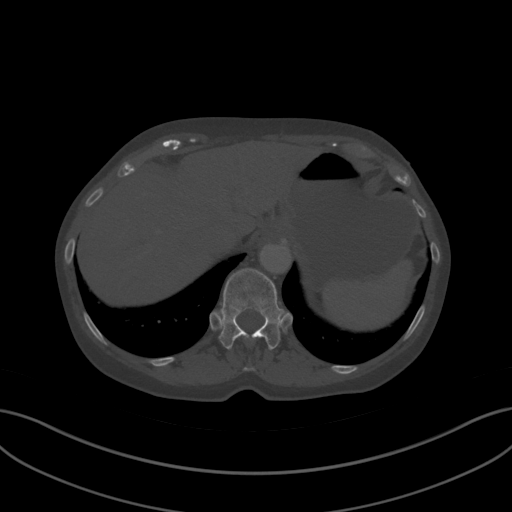
[im 234/249  soft-tissue]
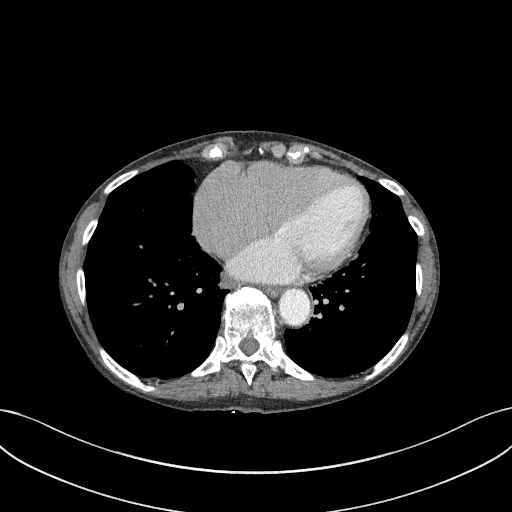

[Series 6: coronal · coronal · 0.71mm/px · 3 of 91 slices shown]
[im 31/91  soft-tissue]
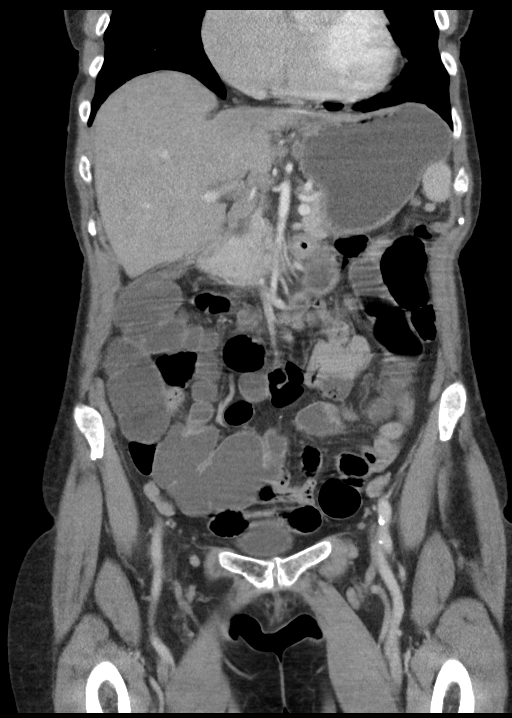
[im 41/91  soft-tissue]
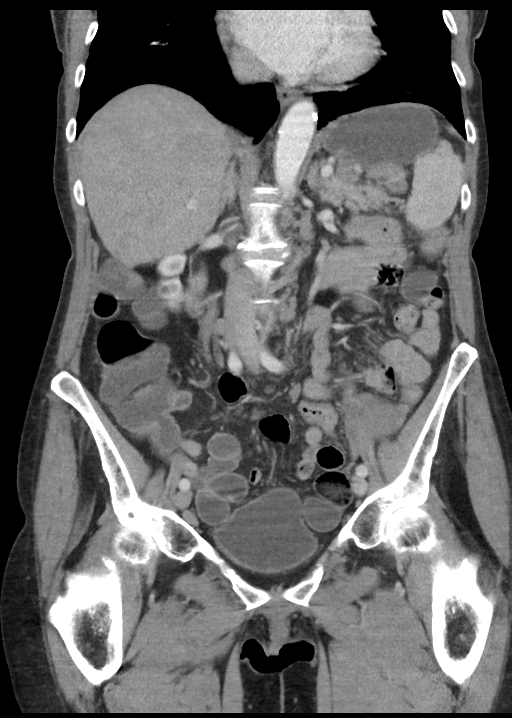
[im 51/91  soft-tissue]
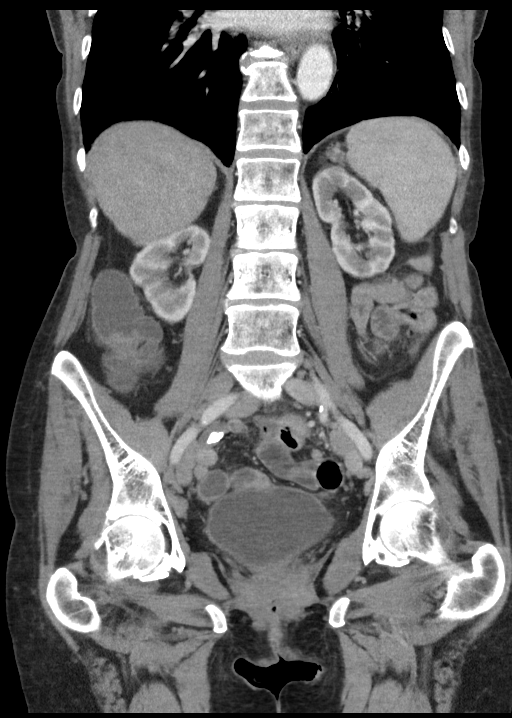

[13 of 46 positions shown; findings below may reference images not displayed]

FINDINGS: Lower chest: Mild bronchiectasis and scarring in the right middle
lobe and inferior segment of the lingula. Cardiomegaly with biatrial
dilatation. Atherosclerotic calcifications in the left circumflex
and right coronary arteries. Thickening calcification of the aortic
valve.

Hepatobiliary: Multiple low-attenuation lesions are again noted
throughout the liver, similar in size, number and distribution to
prior examinations. The largest of these measure up to 1.9 cm in
segment 2, compatible with a simple cyst. subcentimeter
low-attenuation lesions in the liver too small to definitively
characterize, but are statistically likely to represent tiny cysts.
No suspicious hepatic lesions. No intra or extrahepatic biliary
ductal dilatation. Gallbladder is normal in appearance.

Pancreas: No pancreatic mass. No pancreatic ductal dilatation. No
pancreatic or peripancreatic fluid or inflammatory changes.

Spleen: Unremarkable.

Adrenals/Urinary Tract: 1.4 cm low-attenuation lesion in the upper
pole the left kidney is compatible with a simple cyst. Other
subcentimeter low-attenuation lesions in both kidneys are too small
to definitively characterize, but are statistically likely represent
tiny cysts. Bilateral adrenal glands are normal in appearance. No
hydroureteronephrosis. Urinary bladder is normal in appearance.

Stomach/Bowel: The appearance of the stomach is normal. No
pathologic dilatation of small bowel or colon. Previously noted in
tests dissection in the small bowel in the left lower quadrant is no
longer identified. Enterography images demonstrate no compelling
evidence for small bowel tumor in this region. The appendix is not
confidently identified and may be surgically absent. Regardless,
there are no inflammatory changes noted adjacent to the cecum to
suggest the presence of an acute appendicitis at this time.
Circumferential thickening of the distal rectum is noted. Several
prominent borderline enlarged mesorectal lymph nodes measuring up to
8 mm in short axis.

Vascular/Lymphatic: Aortic atherosclerosis, without evidence of
aneurysm or dissection noted in the abdominal or pelvic vasculature.
There multiple borderline enlarged and minimally enlarged bilateral
inguinal lymph nodes measuring up to 11 mm on the left side.
Markedly enlarged left external iliac lymph node measuring 22 mm in
short axis (axial image 155 of series 3), increased compared to
08/20/2016 (previously 19 mm). The largest right-sided pelvic lymph
node is an obturator node measuring 15 mm in short axis, also
increased compared to the prior study. Multiple other borderline
enlarged and mildly enlarged retroperitoneal lymph nodes measuring
up to 12 mm in the aortocaval nodal station (axial image 82 of
series 3).

Reproductive: Status post hysterectomy. Right ovary is not
confidently identified may be surgically absent or atrophic. Left
adnexal mass again noted measuring 3.3 x 4.2 x 3.8 cm on today's
examination (axial image 59 of series 2).

Other: No significant volume of ascites.  No pneumoperitoneum.

Musculoskeletal: Sclerotic lesion with narrow zone of transition
measuring 12 mm in the left ilium, unchanged compared to prior
examinations, most compatible with a bone island. There are no
aggressive appearing lytic or blastic lesions noted in the
visualized portions of the skeleton.
IMPRESSION: 1. No evidence of persistent small bowel intussusception. No
definite small bowel neoplasm identified on enterography images.
2. Worsening lymphadenopathy throughout the abdomen and pelvis,
concerning for recurrent lymphoma.
3. Circumferential thickening of the distal rectum with some
borderline enlarged mesorectal lymph nodes. These findings are
nonspecific, but correlation with sigmoidoscopy or colonoscopy
should be considered if there is any clinical concern for underlying
colorectal neoplasm.
4. Left adnexal mass measuring 3.3 x 4.2 x 3.8 cm, similar to prior
examinations.
5. Cardiomegaly with biatrial dilatation.
6. Aortic atherosclerosis, in addition to least 2 vessel coronary
artery disease. Assessment for potential risk factor modification,
dietary therapy or pharmacologic therapy may be warranted, if
clinically indicated.
7. Additional incidental findings, as above.

Aortic Atherosclerosis ([id number]).

## 2019-08-19 DIAGNOSIS — E782 Mixed hyperlipidemia: Secondary | ICD-10-CM | POA: Diagnosis not present

## 2019-08-19 DIAGNOSIS — I482 Chronic atrial fibrillation, unspecified: Secondary | ICD-10-CM | POA: Diagnosis not present

## 2019-08-19 DIAGNOSIS — I10 Essential (primary) hypertension: Secondary | ICD-10-CM | POA: Diagnosis not present

## 2019-08-19 DIAGNOSIS — I25708 Atherosclerosis of coronary artery bypass graft(s), unspecified, with other forms of angina pectoris: Secondary | ICD-10-CM | POA: Diagnosis not present

## 2019-08-20 DIAGNOSIS — H02889 Meibomian gland dysfunction of unspecified eye, unspecified eyelid: Secondary | ICD-10-CM | POA: Diagnosis not present

## 2019-08-20 DIAGNOSIS — H26499 Other secondary cataract, unspecified eye: Secondary | ICD-10-CM | POA: Diagnosis not present

## 2019-08-20 DIAGNOSIS — H40019 Open angle with borderline findings, low risk, unspecified eye: Secondary | ICD-10-CM | POA: Diagnosis not present

## 2019-08-20 DIAGNOSIS — H40013 Open angle with borderline findings, low risk, bilateral: Secondary | ICD-10-CM | POA: Diagnosis not present

## 2019-08-20 DIAGNOSIS — Z961 Presence of intraocular lens: Secondary | ICD-10-CM | POA: Diagnosis not present

## 2019-08-26 ENCOUNTER — Telehealth: Payer: Self-pay | Admitting: Family Medicine

## 2019-08-26 NOTE — Telephone Encounter (Signed)
Copied from [place] [id number]. Topic: Medicare AWV >> Aug 26, 2019  1:53 PM Cher Nakai R wrote: Reason for CRM:   Left message for patient to call back and schedule Medicare Annual Wellness Visit (AWV) either virtually or in office.  Last AWV 09/03/2018  Please schedule at anytime with [facility] Advisor.

## 2019-09-08 MED FILL — VENCLEXTA 100 MG TABS: 100 | 30 days supply | Qty: 90 | Fill #2

## 2019-09-21 NOTE — Progress Notes (Signed)
Subjective:   Jacqueline Webb is a 79 y.o. female who presents for Medicare Annual (Subsequent) preventive examination.  I connected with Caroline More today by telephone and verified that I am speaking with the correct person using two identifiers. Location patient: home Location provider: work Persons participating in the virtual visit: patient, provider.   I discussed the limitations, risks, security and privacy concerns of performing an evaluation and management service by telephone and the availability of in person appointments. I also discussed with the patient that there may be a patient responsible charge related to this service. The patient expressed understanding and verbally consented to this telephonic visit.    Interactive audio and video telecommunications were attempted between this provider and patient, however failed, due to patient having technical difficulties OR patient did not have access to video capability.  We continued and completed visit with audio only.   Review of Systems    N/A  Cardiac Risk Factors include: advanced age (>32men, >39 women);dyslipidemia;hypertension     Objective:    There were no vitals filed for this visit. There is no height or weight on file to calculate BMI.  Advanced Directives 09/22/2019 02/27/2019 02/02/2019 11/04/2018 09/03/2018 06/03/2018 05/06/2018  Does Patient Have a Medical Advance Directive? No No No No No Yes Yes  Type of Advance Directive - - - - - Living will;Healthcare Power of [place];Living will  Does patient want to make changes to medical advance directive? - - - - - No - Patient declined No - Patient declined  Copy of [place] in Chart? - - - - - - No - copy requested  Would patient like information on creating a medical advance directive? No - Patient declined - - - No - Patient declined - -    Current Medications (verified) Outpatient Encounter Medications as  of 09/22/2019  Medication Sig  . ezetimibe (ZETIA) 10 MG tablet Take 1 tablet (10 mg total) daily by mouth.  . isosorbide mononitrate (IMDUR) 30 MG 24 hr tablet Take 30 mg by mouth daily.   [name] levothyroxine (SYNTHROID) 88 MCG tablet TAKE 1 TABLET BY MOUTH  DAILY BEFORE BREAKFAST  . lisinopril-hydrochlorothiazide (ZESTORETIC) 20-12.5 MG tablet TAKE 1 TABLET BY MOUTH  DAILY  . Melatonin 1 MG CAPS Take 1 capsule by mouth at bedtime.   . metoprolol succinate (TOPROL-XL) 25 MG 24 hr tablet Take 25 mg by mouth daily.   . ondansetron (ZOFRAN) 4 MG tablet Take 1 tablet (4 mg total) by mouth every 8 (eight) hours as needed for nausea or vomiting.  [name] oxybutynin (DITROPAN) 5 MG tablet Take 1 tablet (5 mg total) by mouth 2 (two) times daily.  . rosuvastatin (CRESTOR) 5 MG tablet Take 5 mg by mouth in the morning, at noon, and at bedtime.  . VENCLEXTA 100 MG TABS TAKE 3 TABLETS BY MOUTH DAILY  . XARELTO 20 MG TABS tablet Take 20 mg by mouth daily with supper.   [name] allopurinol (ZYLOPRIM) 300 MG tablet TAKE 1 TABLET(300 MG) BY MOUTH TWICE DAILY (Patient not taking: Reported on 08/05/2019)  . Docusate Calcium (STOOL SOFTENER PO) Take by mouth as needed. (Patient not taking: Reported on 08/05/2019)  . nitrofurantoin, macrocrystal-monohydrate, (MACROBID) 100 MG capsule Take 1 capsule (100 mg total) by mouth 2 (two) times daily. (Patient not taking: Reported on 08/05/2019)   Facility-Administered Encounter Medications as of 09/22/2019  Medication  . heparin lock flush 100 unit/mL  . sodium chloride flush (  NS) 0.9 % injection 10 mL    Allergies (verified) Pravastatin   History: Past Medical History:  Diagnosis Date  . A-fib ([place])   . Benign neoplasm of rectum and anal canal   . 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 Dysrhythmia    A-Fib  . Hypercholesteremia   . Hypertension   . Hyperthyroidism   . Lymphoma ([place]) 2007  . Non Hodgkin's lymphoma ([place])  01/22/2006  . Personal history of chemotherapy    Past Surgical History:  Procedure Laterality Date  . ABDOMINAL HYSTERECTOMY  1958  . APPENDECTOMY    . BREAST BIOPSY Right 1970   right breast biopsy with clip- neg  . BREAST BIOPSY Left 2002   left breast biopsy with clip-neg  . BREAST BIOPSY Right 2014   right breast biopsy with clip-neg  . BREAST CYST ASPIRATION Bilateral 2000   bilateral fine needle aspiration  . BREAST SURGERY Left 1990   biopsy  . BREAST SURGERY Right May 08, 2012   complex fibroadenoma without malignancy.  [name] CARDIAC CATHETERIZATION  2014  . COLONOSCOPY  2009, 2012    hyperplastic rectal polyp 2012 tubular adenoma of proximal ascending colon 2000 9 repeat exam due to 2017.  [name] COLONOSCOPY WITH PROPOFOL N/A 11/06/2016   Procedure: COLONOSCOPY WITH PROPOFOL;  Surgeon: Robert Bellow, MD;  Location: [facility] ENDOSCOPY;  Service: Endoscopy;  Laterality: N/A;  . COLONOSCOPY WITH PROPOFOL N/A 02/27/2019   Procedure: COLONOSCOPY WITH PROPOFOL;  Surgeon: Robert Bellow, MD;  Location: [facility] ENDOSCOPY;  Service: General;  Laterality: N/A;  . CORONARY ARTERY BYPASS GRAFT  1999  . FLEXIBLE SIGMOIDOSCOPY  1998   Family History  Problem Relation Age of Onset  . Lung cancer Father        Died age 86  . Heart disease Mother        Died age 28  . Asthma Mother   . CAD Brother        double bypass  . Heart attack Sister   . Tuberculosis Sister        21 years old  . Lung cancer Sister        Died in her 94s  . Angina Sister   . Breast cancer Sister 16  . Rheum arthritis Sister   . Aneurysm Sister        Brain, died age 66  . Angina Sister   . Heart disease Brother        Stent placement  . Heart disease Brother        stent placement  . Cancer Other        breast cancer niece, pancreatic cancer niece  . Breast cancer Other   . Ovarian cancer Neg Hx   . Diabetes Neg Hx    Social History   Socioeconomic History  . Marital status: Married    Spouse  name: Not on file  . Number of children: 2  . Years of education: In [place]  . Highest education level: 10th grade  Occupational History  . Occupation: retired  Tobacco Use  . Smoking status: Never Smoker  . Smokeless tobacco: Never Used  Vaping Use  . Vaping Use: Never used  Substance and Sexual Activity  . Alcohol use: Yes    Alcohol/week: 0.0 - 7.0 standard drinks    Comment: 1 drink a day of wine or cocktail  . Drug use: No  . Sexual activity: Never  Other Topics Concern  . Not on file  Social History Narrative  . Not on file   Social Determinants of Health   Financial Resource Strain: Low Risk   . Difficulty of Paying Living Expenses: Not hard at all  Food Insecurity: No Food Insecurity  . Worried About Charity fundraiser in the Last Year: Never true  . Ran Out of Food in the Last Year: Never true  Transportation Needs: No Transportation Needs  . Lack of Transportation (Medical): No  . Lack of Transportation (Non-Medical): No  Physical Activity: Inactive  . Days of Exercise per Week: 0 days  . Minutes of Exercise per Session: 0 min  Stress: No Stress Concern Present  . Feeling of Stress : Not at all  Social Connections: Socially Integrated  . Frequency of Communication with Friends and Family: More than three times a week  . Frequency of Social Gatherings with Friends and Family: More than three times a week  . Attends Religious Services: More than 4 times per year  . Active Member of Clubs or Organizations: Yes  . Attends Archivist Meetings: More than 4 times per year  . Marital Status: Married    Tobacco Counseling Counseling given: Not Answered   Clinical Intake:  Pre-visit preparation completed: Yes  Pain : No/denies pain     Nutritional Risks: Nausea/ vomitting/ diarrhea (Nausea occasionally due to chemo meds. Has medication to help with nausea.) Diabetes: No  How often do you need to have someone help you when you read instructions,  pamphlets, or other written materials from your doctor or pharmacy?: 1 - Never  Diabetic? No  Interpreter Needed?: No  Information entered by :: [facility], LPN   Activities of Daily Living In your present state of health, do you have any difficulty performing the following activities: 09/22/2019  Hearing? N  Vision? N  Difficulty concentrating or making decisions? N  Walking or climbing stairs? N  Dressing or bathing? N  Doing errands, shopping? N  Preparing Food and eating ? N  Using the Toilet? N  In the past six months, have you accidently leaked urine? N  Do you have problems with loss of bowel control? N  Managing your Medications? N  Managing your Finances? N  Housekeeping or managing your Housekeeping? N  Some recent data might be hidden    Patient Care Team: [name], MD as PCP - General (Unknown Physician Specialty) Bary Castilla, Forest Gleason, MD (General Surgery) Corey Skains, MD as Consulting Physician (Cardiology) Anell Barr, OD as Consulting Physician (Optometry) Cammie Sickle, MD as Consulting Physician (Internal Medicine)  Indicate any recent Medical Services you may have received from other than [facility] providers in the past year (date may be approximate).     Assessment:   This is a routine wellness examination for [place].  Hearing/Vision screen No exam data present  Dietary issues and exercise activities discussed: Current Exercise Habits: Home exercise routine, Type of exercise: Other - see comments (Plays golf), Time (Minutes): > 60, Frequency (Times/Week): 4, Weekly Exercise (Minutes/Week): 0, Intensity: Mild, Exercise limited by: None identified  Goals    . Increase water intake     Starting 11/17/15, I will increase to 6 glasses of water a day.      Depression Screen PHQ 2/9 Scores 09/22/2019 09/03/2018 12/03/2016 11/17/2015 09/28/2014  PHQ - 2 Score 0 0 0 0 0  PHQ- 9 Score - - 0 - -  Fall Risk Fall Risk  09/22/2019  09/03/2018 05/02/2017 12/03/2016 11/17/2015  Falls in the past year? 0 0 No Yes No  Number falls in past yr: 0 - - - -  Injury with Fall? 0 - - - -    Any stairs in or around the home? Yes  If so, are there any without handrails? No  Home free of loose throw rugs in walkways, pet beds, electrical cords, etc? Yes  Adequate lighting in your home to reduce risk of falls? Yes   ASSISTIVE DEVICES UTILIZED TO PREVENT FALLS:  Life alert? No  Use of a cane, walker or w/c? No  Grab bars in the bathroom? No  Shower chair or bench in shower? No  Elevated toilet seat or a handicapped toilet? No    Cognitive Function: Declined today.     6CIT Screen 11/17/2015  What Year? 0 points  What month? 0 points  What time? 0 points  Count back from 20 0 points  Months in reverse 0 points  Repeat phrase 0 points  Total Score 0    Immunizations Immunization History  Administered Date(s) Administered  . Fluad Quad(high Dose 65+) 12/01/2018  . Influenza, High Dose Seasonal PF 12/10/2014, 11/17/2015, 10/03/2016, 01/06/2018  . Moderna SARS-COVID-2 Vaccination 02/10/2019, 03/10/2019  . Pneumococcal Conjugate-13 12/31/2013  . Pneumococcal Polysaccharide-23 11/21/2010  . Td 10/09/2007, 05/26/2018    TDAP status: Up to date Flu Vaccine status: Due fall 2021 Pneumococcal vaccine status: Up to date Covid-19 vaccine status: Completed vaccines  Qualifies for Shingles Vaccine? Yes   Zostavax completed No   Shingrix Completed?: No.    Education has been provided regarding the importance of this vaccine. Patient has been advised to call insurance company to determine out of pocket expense if they have not yet received this vaccine. Advised may also receive vaccine at local pharmacy or Health Dept. Verbalized acceptance and understanding.  Screening Tests Health Maintenance  Topic Date Due  . INFLUENZA VACCINE  08/23/2019  . DEXA SCAN  11/16/2025 (Originally 03/23/2008)  . TETANUS/TDAP  05/25/2028  .  COVID-19 Vaccine  Completed  . PNA vac Low Risk Adult  Completed    Health Maintenance  Health Maintenance Due  Topic Date Due  . INFLUENZA VACCINE  08/23/2019    Colorectal cancer screening: No longer required.  Mammogram status: No longer required.  Bone Density status: Currently due. Declined order at this time.  Lung Cancer Screening: (Low Dose CT Chest recommended if Age 83-80 years, 30 pack-year currently smoking OR have quit w/in 15years.) does not qualify.   Additional Screening:  Vision Screening: Recommended annual ophthalmology exams for early detection of glaucoma and other disorders of the eye. Is the patient up to date with their annual eye exam?  Yes  Who is the provider or what is the name of the office in which the patient attends annual eye exams? Dr Ellin Mayhew If pt is not established with a provider, would they like to be referred to a provider to establish care? No .   Dental Screening: Recommended annual dental exams for proper oral hygiene  Community Resource Referral / Chronic Care Management: CRR required this visit?  No   CCM required this visit?  No      Plan:     I have personally reviewed and noted the following in the patient's chart:   . Medical and social history . Use of alcohol, tobacco or illicit drugs  . Current medications and supplements . Functional ability  and status . Nutritional status . Physical activity . Advanced directives . List of other physicians . Hospitalizations, surgeries, and ER visits in previous 12 months . Vitals . Screenings to include cognitive, depression, and falls . Referrals and appointments  In addition, I have reviewed and discussed with patient certain preventive protocols, quality metrics, and best practice recommendations. A written personalized care plan for preventive services as well as general preventive health recommendations were provided to patient.     Mayme Profeta [place], [place]   09/23/1113    Nurse Notes: Pt advised the flu shot is due when available. Pt declined a DEXA scan order at this time.

## 2019-09-22 ENCOUNTER — Ambulatory Visit (INDEPENDENT_AMBULATORY_CARE_PROVIDER_SITE_OTHER): Payer: Medicare Other

## 2019-09-22 ENCOUNTER — Other Ambulatory Visit: Payer: Self-pay

## 2019-09-22 DIAGNOSIS — Z0000 Encounter for general adult medical examination without abnormal findings: Secondary | ICD-10-CM

## 2019-09-22 NOTE — Patient Instructions (Signed)
Jacqueline Webb , Thank you for taking time to come for your Medicare Wellness Visit. I appreciate your ongoing commitment to your health goals. Please review the following plan we discussed and let me know if I can assist you in the future.   Screening recommendations/referrals: Colonoscopy: No longer required.  Mammogram: No longer required.  Bone Density: Currently due, declined order at this time. Recommended yearly ophthalmology/optometry visit for glaucoma screening and checkup Recommended yearly dental visit for hygiene and checkup  Vaccinations: Influenza vaccine: Due fall 2021 Pneumococcal vaccine: Completed series Tdap vaccine: Up to date, due 05/2028 Shingles vaccine: Shingrix discussed. Please contact your pharmacy for coverage information.     Advanced directives: Advance directive discussed with you today. Even though you declined this today please call our office should you change your mind and we can give you the proper paperwork for you to fill out.  Conditions/risks identified: Recommend increasing water intake to 6-8 8 oz glasses a day.   Next appointment: 11/04/19 @ 11:20 AM with Dr [name]. Declined scheduling an AWV for 2022 at this time.   Preventive Care 19 Years and Older, Female Preventive care refers to lifestyle choices and visits with your health care provider that can promote health and wellness. What does preventive care include?  A yearly physical exam. This is also called an annual well check.  Dental exams once or twice a year.  Routine eye exams. Ask your health care provider how often you should have your eyes checked.  Personal lifestyle choices, including:  Daily care of your teeth and gums.  Regular physical activity.  Eating a healthy diet.  Avoiding tobacco and drug use.  Limiting alcohol use.  Practicing safe sex.  Taking low-dose aspirin every day.  Taking vitamin and mineral supplements as recommended by your health care  provider. What happens during an annual well check? The services and screenings done by your health care provider during your annual well check will depend on your age, overall health, lifestyle risk factors, and family history of disease. Counseling  Your health care provider may ask you questions about your:  Alcohol use.  Tobacco use.  Drug use.  Emotional well-being.  Home and relationship well-being.  Sexual activity.  Eating habits.  History of falls.  Memory and ability to understand (cognition).  Work and work Statistician.  Reproductive health. Screening  You may have the following tests or measurements:  Height, weight, and BMI.  Blood pressure.  Lipid and cholesterol levels. These may be checked every 5 years, or more frequently if you are over 53 years old.  Skin check.  Lung cancer screening. You may have this screening every year starting at age 65 if you have a 30-pack-year history of smoking and currently smoke or have quit within the past 15 years.  Fecal occult blood test (FOBT) of the stool. You may have this test every year starting at age 43.  Flexible sigmoidoscopy or colonoscopy. You may have a sigmoidoscopy every 5 years or a colonoscopy every 10 years starting at age 7.  Hepatitis C blood test.  Hepatitis B blood test.  Sexually transmitted disease (STD) testing.  Diabetes screening. This is done by checking your blood sugar (glucose) after you have not eaten for a while (fasting). You may have this done every 1-3 years.  Bone density scan. This is done to screen for osteoporosis. You may have this done starting at age 58.  Mammogram. This may be done every 1-2 years. Talk to  your health care provider about how often you should have regular mammograms. Talk with your health care provider about your test results, treatment options, and if necessary, the need for more tests. Vaccines  Your health care provider may recommend certain  vaccines, such as:  Influenza vaccine. This is recommended every year.  Tetanus, diphtheria, and acellular pertussis (Tdap, Td) vaccine. You may need a Td booster every 10 years.  Zoster vaccine. You may need this after age 50.  Pneumococcal 13-valent conjugate (PCV13) vaccine. One dose is recommended after age 63.  Pneumococcal polysaccharide (PPSV23) vaccine. One dose is recommended after age 49. Talk to your health care provider about which screenings and vaccines you need and how often you need them. This information is not intended to replace advice given to you by your health care provider. Make sure you discuss any questions you have with your health care provider. Document Released: 02/04/2015 Document Revised: 09/28/2015 Document Reviewed: 11/09/2014 Elsevier Interactive Patient Education  2017 [employer] Prevention in the Home Falls can cause injuries. They can happen to people of all ages. There are many things you can do to make your home safe and to help prevent falls. What can I do on the outside of my home?  Regularly fix the edges of walkways and driveways and fix any cracks.  Remove anything that might make you trip as you walk through a door, such as a raised step or threshold.  Trim any bushes or trees on the path to your home.  Use bright outdoor lighting.  Clear any walking paths of anything that might make someone trip, such as rocks or tools.  Regularly check to see if handrails are loose or broken. Make sure that both sides of any steps have handrails.  Any raised decks and porches should have guardrails on the edges.  Have any leaves, snow, or ice cleared regularly.  Use sand or salt on walking paths during winter.  Clean up any spills in your garage right away. This includes oil or grease spills. What can I do in the bathroom?  Use night lights.  Install grab bars by the toilet and in the tub and shower. Do not use towel bars as grab  bars.  Use non-skid mats or decals in the tub or shower.  If you need to sit down in the shower, use a plastic, non-slip stool.  Keep the floor dry. Clean up any water that spills on the floor as soon as it happens.  Remove soap buildup in the tub or shower regularly.  Attach bath mats securely with double-sided non-slip rug tape.  Do not have throw rugs and other things on the floor that can make you trip. What can I do in the bedroom?  Use night lights.  Make sure that you have a light by your bed that is easy to reach.  Do not use any sheets or blankets that are too big for your bed. They should not hang down onto the floor.  Have a firm chair that has side arms. You can use this for support while you get dressed.  Do not have throw rugs and other things on the floor that can make you trip. What can I do in the kitchen?  Clean up any spills right away.  Avoid walking on wet floors.  Keep items that you use a lot in easy-to-reach places.  If you need to reach something above you, use a strong step stool that has  a grab bar.  Keep electrical cords out of the way.  Do not use floor polish or wax that makes floors slippery. If you must use wax, use non-skid floor wax.  Do not have throw rugs and other things on the floor that can make you trip. What can I do with my stairs?  Do not leave any items on the stairs.  Make sure that there are handrails on both sides of the stairs and use them. Fix handrails that are broken or loose. Make sure that handrails are as long as the stairways.  Check any carpeting to make sure that it is firmly attached to the stairs. Fix any carpet that is loose or worn.  Avoid having throw rugs at the top or bottom of the stairs. If you do have throw rugs, attach them to the floor with carpet tape.  Make sure that you have a light switch at the top of the stairs and the bottom of the stairs. If you do not have them, ask someone to add them for  you. What else can I do to help prevent falls?  Wear shoes that:  Do not have high heels.  Have rubber bottoms.  Are comfortable and fit you well.  Are closed at the toe. Do not wear sandals.  If you use a stepladder:  Make sure that it is fully opened. Do not climb a closed stepladder.  Make sure that both sides of the stepladder are locked into place.  Ask someone to hold it for you, if possible.  Clearly mark and make sure that you can see:  Any grab bars or handrails.  First and last steps.  Where the edge of each step is.  Use tools that help you move around (mobility aids) if they are needed. These include:  Canes.  Walkers.  Scooters.  Crutches.  Turn on the lights when you go into a dark area. Replace any light bulbs as soon as they burn out.  Set up your furniture so you have a clear path. Avoid moving your furniture around.  If any of your floors are uneven, fix them.  If there are any pets around you, be aware of where they are.  Review your medicines with your doctor. Some medicines can make you feel dizzy. This can increase your chance of falling. Ask your doctor what other things that you can do to help prevent falls. This information is not intended to replace advice given to you by your health care provider. Make sure you discuss any questions you have with your health care provider. Document Released: 11/04/2008 Document Revised: 06/16/2015 Document Reviewed: 02/12/2014 Elsevier Interactive Patient Education  2017 [employer].

## 2019-09-29 DIAGNOSIS — Z20828 Contact with and (suspected) exposure to other viral communicable diseases: Secondary | ICD-10-CM | POA: Diagnosis not present

## 2019-09-30 ENCOUNTER — Other Ambulatory Visit: Payer: Self-pay | Admitting: Internal Medicine

## 2019-09-30 DIAGNOSIS — C8308 Small cell B-cell lymphoma, lymph nodes of multiple sites: Secondary | ICD-10-CM

## 2019-10-01 DIAGNOSIS — I8310 Varicose veins of unspecified lower extremity with inflammation: Secondary | ICD-10-CM | POA: Diagnosis not present

## 2019-10-01 DIAGNOSIS — Z1283 Encounter for screening for malignant neoplasm of skin: Secondary | ICD-10-CM | POA: Diagnosis not present

## 2019-10-01 DIAGNOSIS — L821 Other seborrheic keratosis: Secondary | ICD-10-CM | POA: Diagnosis not present

## 2019-10-01 DIAGNOSIS — D485 Neoplasm of uncertain behavior of skin: Secondary | ICD-10-CM | POA: Diagnosis not present

## 2019-10-01 DIAGNOSIS — D1801 Hemangioma of skin and subcutaneous tissue: Secondary | ICD-10-CM | POA: Diagnosis not present

## 2019-10-01 DIAGNOSIS — X32XXXS Exposure to sunlight, sequela: Secondary | ICD-10-CM | POA: Diagnosis not present

## 2019-10-01 DIAGNOSIS — Z85828 Personal history of other malignant neoplasm of skin: Secondary | ICD-10-CM | POA: Diagnosis not present

## 2019-10-01 DIAGNOSIS — L570 Actinic keratosis: Secondary | ICD-10-CM | POA: Diagnosis not present

## 2019-10-01 DIAGNOSIS — L719 Rosacea, unspecified: Secondary | ICD-10-CM | POA: Diagnosis not present

## 2019-10-01 DIAGNOSIS — D225 Melanocytic nevi of trunk: Secondary | ICD-10-CM | POA: Diagnosis not present

## 2019-10-01 DIAGNOSIS — C44729 Squamous cell carcinoma of skin of left lower limb, including hip: Secondary | ICD-10-CM | POA: Diagnosis not present

## 2019-10-01 DIAGNOSIS — Z85820 Personal history of malignant melanoma of skin: Secondary | ICD-10-CM | POA: Diagnosis not present

## 2019-10-02 DIAGNOSIS — H26499 Other secondary cataract, unspecified eye: Secondary | ICD-10-CM | POA: Diagnosis not present

## 2019-10-02 DIAGNOSIS — Z961 Presence of intraocular lens: Secondary | ICD-10-CM | POA: Diagnosis not present

## 2019-10-02 DIAGNOSIS — H04129 Dry eye syndrome of unspecified lacrimal gland: Secondary | ICD-10-CM | POA: Diagnosis not present

## 2019-10-02 DIAGNOSIS — H02889 Meibomian gland dysfunction of unspecified eye, unspecified eyelid: Secondary | ICD-10-CM | POA: Diagnosis not present

## 2019-10-02 DIAGNOSIS — H40019 Open angle with borderline findings, low risk, unspecified eye: Secondary | ICD-10-CM | POA: Diagnosis not present

## 2019-10-02 DIAGNOSIS — H02839 Dermatochalasis of unspecified eye, unspecified eyelid: Secondary | ICD-10-CM | POA: Diagnosis not present

## 2019-10-05 MED FILL — VENCLEXTA 100 MG TABS: 100 | 30 days supply | Qty: 90 | Fill #0

## 2019-10-13 DIAGNOSIS — I25708 Atherosclerosis of coronary artery bypass graft(s), unspecified, with other forms of angina pectoris: Secondary | ICD-10-CM | POA: Diagnosis not present

## 2019-10-21 DIAGNOSIS — I25708 Atherosclerosis of coronary artery bypass graft(s), unspecified, with other forms of angina pectoris: Secondary | ICD-10-CM | POA: Diagnosis not present

## 2019-11-03 ENCOUNTER — Inpatient Hospital Stay: Payer: Medicare Other | Attending: Internal Medicine

## 2019-11-03 ENCOUNTER — Encounter: Payer: Self-pay | Admitting: Internal Medicine

## 2019-11-03 ENCOUNTER — Inpatient Hospital Stay (HOSPITAL_BASED_OUTPATIENT_CLINIC_OR_DEPARTMENT_OTHER): Payer: Medicare Other | Admitting: Internal Medicine

## 2019-11-03 ENCOUNTER — Ambulatory Visit (INDEPENDENT_AMBULATORY_CARE_PROVIDER_SITE_OTHER): Payer: Medicare Other | Admitting: Family Medicine

## 2019-11-03 ENCOUNTER — Encounter: Payer: Self-pay | Admitting: Family Medicine

## 2019-11-03 ENCOUNTER — Other Ambulatory Visit: Payer: Self-pay

## 2019-11-03 VITALS — BP 100/63 | HR 73 | Temp 98.4°F | Resp 16 | Wt 141.0 lb

## 2019-11-03 DIAGNOSIS — Z90710 Acquired absence of both cervix and uterus: Secondary | ICD-10-CM | POA: Insufficient documentation

## 2019-11-03 DIAGNOSIS — Z8261 Family history of arthritis: Secondary | ICD-10-CM | POA: Insufficient documentation

## 2019-11-03 DIAGNOSIS — I4891 Unspecified atrial fibrillation: Secondary | ICD-10-CM | POA: Diagnosis not present

## 2019-11-03 DIAGNOSIS — Z853 Personal history of malignant neoplasm of breast: Secondary | ICD-10-CM

## 2019-11-03 DIAGNOSIS — Z8249 Family history of ischemic heart disease and other diseases of the circulatory system: Secondary | ICD-10-CM | POA: Insufficient documentation

## 2019-11-03 DIAGNOSIS — Z79899 Other long term (current) drug therapy: Secondary | ICD-10-CM | POA: Insufficient documentation

## 2019-11-03 DIAGNOSIS — E039 Hypothyroidism, unspecified: Secondary | ICD-10-CM | POA: Diagnosis not present

## 2019-11-03 DIAGNOSIS — D696 Thrombocytopenia, unspecified: Secondary | ICD-10-CM | POA: Insufficient documentation

## 2019-11-03 DIAGNOSIS — Z9221 Personal history of antineoplastic chemotherapy: Secondary | ICD-10-CM | POA: Insufficient documentation

## 2019-11-03 DIAGNOSIS — C8308 Small cell B-cell lymphoma, lymph nodes of multiple sites: Secondary | ICD-10-CM | POA: Diagnosis not present

## 2019-11-03 DIAGNOSIS — N183 Chronic kidney disease, stage 3 unspecified: Secondary | ICD-10-CM | POA: Insufficient documentation

## 2019-11-03 DIAGNOSIS — Z801 Family history of malignant neoplasm of trachea, bronchus and lung: Secondary | ICD-10-CM | POA: Diagnosis not present

## 2019-11-03 DIAGNOSIS — E7800 Pure hypercholesterolemia, unspecified: Secondary | ICD-10-CM

## 2019-11-03 DIAGNOSIS — Z7901 Long term (current) use of anticoagulants: Secondary | ICD-10-CM | POA: Insufficient documentation

## 2019-11-03 DIAGNOSIS — I10 Essential (primary) hypertension: Secondary | ICD-10-CM | POA: Diagnosis not present

## 2019-11-03 DIAGNOSIS — Z803 Family history of malignant neoplasm of breast: Secondary | ICD-10-CM | POA: Diagnosis not present

## 2019-11-03 DIAGNOSIS — I25810 Atherosclerosis of coronary artery bypass graft(s) without angina pectoris: Secondary | ICD-10-CM

## 2019-11-03 LAB — CBC WITH DIFFERENTIAL/PLATELET
Abs Immature Granulocytes: 0.01 10*3/uL (ref 0.00–0.07)
Basophils Absolute: 0 10*3/uL (ref 0.0–0.1)
Basophils Relative: 1 %
Eosinophils Absolute: 0 10*3/uL (ref 0.0–0.5)
Eosinophils Relative: 1 %
HCT: 37.1 % (ref 36.0–46.0)
Hemoglobin: 13 g/dL (ref 12.0–15.0)
Immature Granulocytes: 0 %
Lymphocytes Relative: 29 %
Lymphs Abs: 1 10*3/uL (ref 0.7–4.0)
MCH: 31.6 pg (ref 26.0–34.0)
MCHC: 35 g/dL (ref 30.0–36.0)
MCV: 90 fL (ref 80.0–100.0)
Monocytes Absolute: 0.5 10*3/uL (ref 0.1–1.0)
Monocytes Relative: 14 %
Neutro Abs: 1.9 10*3/uL (ref 1.7–7.7)
Neutrophils Relative %: 55 %
Platelets: 86 10*3/uL — ABNORMAL LOW (ref 150–400)
RBC: 4.12 MIL/uL (ref 3.87–5.11)
RDW: 13.7 % (ref 11.5–15.5)
WBC: 3.4 10*3/uL — ABNORMAL LOW (ref 4.0–10.5)
nRBC: 0 % (ref 0.0–0.2)

## 2019-11-03 LAB — COMPREHENSIVE METABOLIC PANEL
ALT: 20 U/L (ref 0–44)
AST: 31 U/L (ref 15–41)
Albumin: 4.2 g/dL (ref 3.5–5.0)
Alkaline Phosphatase: 66 U/L (ref 38–126)
Anion gap: 9 (ref 5–15)
BUN: 14 mg/dL (ref 8–23)
CO2: 28 mmol/L (ref 22–32)
Calcium: 9.5 mg/dL (ref 8.9–10.3)
Chloride: 95 mmol/L — ABNORMAL LOW (ref 98–111)
Creatinine, Ser: 1.06 mg/dL — ABNORMAL HIGH (ref 0.44–1.00)
GFR, Estimated: 49 mL/min — ABNORMAL LOW (ref >60)
Glucose, Bld: 93 mg/dL (ref 70–99)
Potassium: 3.4 mmol/L — ABNORMAL LOW (ref 3.5–5.1)
Sodium: 132 mmol/L — ABNORMAL LOW (ref 135–145)
Total Bilirubin: 1.4 mg/dL — ABNORMAL HIGH (ref 0.3–1.2)
Total Protein: 7 g/dL (ref 6.5–8.1)

## 2019-11-03 LAB — LACTATE DEHYDROGENASE: LDH: 162 U/L (ref 98–192)

## 2019-11-03 NOTE — Progress Notes (Signed)
I,April Miller,acting as a scribe for [name], MD.,have documented all relevant documentation on the behalf of [name], MD,as directed by  [name], MD while in the presence of [name], MD.   Established patient visit   Patient: Jacqueline Webb   DOB: 18-Jul-1940   79 y.o. Female  MRN: [id number] Visit Date: 11/03/2019  Today's healthcare provider: [name], MD   Chief Complaint  Patient presents with  . Follow-up  . Hyperlipidemia  . Hypertension  . Hypothyroidism   Subjective    HPI  Pt remains active. Plays golf several times per week. Hypertension, follow-up  BP Readings from Last 3 Encounters:  11/03/19 100/63  11/03/19 123/75  08/05/19 105/66   Wt Readings from Last 3 Encounters:  11/03/19 141 lb (64 kg)  11/03/19 140 lb (63.5 kg)  08/05/19 143 lb (64.9 kg)     She was last seen for hypertension 6 months ago.  BP at that visit was 127/77. Management since that visit includes; Good control on HCTZ/lisinopril. She reports good compliance with treatment. She is not having side effects. none She is exercising. She is adherent to low salt diet.   Outside blood pressures are not checking.  She does not smoke.  Use of agents associated with hypertension: none.   --------------------------------------------------------------------  Lipid/Cholesterol, follow-up  Last Lipid Panel: Lab Results  Component Value Date   CHOL 174 07/03/2017   LDLCALC 78 07/03/2017   HDL 71 07/03/2017   TRIG 123 07/03/2017    She was last seen for this 07/03/2017.  Management since that visit includes; on rosuvastatin. She reports good compliance with treatment. She is having side effects. Has nightmares She is following a Regular diet. Current exercise: walking  Last metabolic panel Lab Results  Component Value Date   GLUCOSE 93 11/03/2019   NA 132 (L) 11/03/2019   K 3.4 (L) 11/03/2019   BUN 14 11/03/2019    CREATININE 1.06 (H) 11/03/2019   GFRNONAA 49 (L) 11/03/2019   GFRAA >60 08/05/2019   CALCIUM 9.5 11/03/2019   AST 31 11/03/2019   ALT 20 11/03/2019   The ASCVD Risk score (Goff DC Jr., et al., 2013) failed to calculate for the following reasons:   The 2013 ASCVD risk score is only valid for ages 73 to 15  ---------------------------------------------------------------------------------------------------      Medications: Outpatient Medications Prior to Visit  Medication Sig  . Docusate Calcium (STOOL SOFTENER PO) Take by mouth as needed.   . ezetimibe (ZETIA) 10 MG tablet Take 1 tablet (10 mg total) daily by mouth.  . isosorbide mononitrate (IMDUR) 30 MG 24 hr tablet Take 30 mg by mouth daily.   [name] levothyroxine (SYNTHROID) 88 MCG tablet TAKE 1 TABLET BY MOUTH  DAILY BEFORE BREAKFAST  . lisinopril-hydrochlorothiazide (ZESTORETIC) 20-12.5 MG tablet TAKE 1 TABLET BY MOUTH  DAILY  . melatonin 5 MG TABS Take 5 mg by mouth.  . metoprolol succinate (TOPROL-XL) 25 MG 24 hr tablet Take 25 mg by mouth daily.   . ondansetron (ZOFRAN) 4 MG tablet Take 1 tablet (4 mg total) by mouth every 8 (eight) hours as needed for nausea or vomiting.  [name] oxybutynin (DITROPAN) 5 MG tablet Take 1 tablet (5 mg total) by mouth 2 (two) times daily.  . rosuvastatin (CRESTOR) 5 MG tablet Take 5 mg by mouth daily. Takes 3 times a week  . VENCLEXTA 100 MG TABS TAKE 3 TABLETS BY MOUTH DAILY  . Alveda Reasons  20 MG TABS tablet Take 20 mg by mouth daily with supper.   [name] allopurinol (ZYLOPRIM) 300 MG tablet TAKE 1 TABLET(300 MG) BY MOUTH TWICE DAILY (Patient not taking: Reported on 08/05/2019)  . [DISCONTINUED] Melatonin 1 MG CAPS Take 1 capsule by mouth at bedtime.    Facility-Administered Medications Prior to Visit  Medication Dose Route Frequency Provider  . heparin lock flush 100 unit/mL  500 Units Intravenous Once Charlaine Dalton R, MD  . sodium chloride flush (NS) 0.9 % injection 10 mL  10 mL Intravenous PRN  Cammie Sickle, MD    Review of Systems  Constitutional: Negative for appetite change, chills, fatigue and fever.  Respiratory: Negative for chest tightness and shortness of breath.   Cardiovascular: Negative for chest pain and palpitations.  Gastrointestinal: Negative for abdominal pain, nausea and vomiting.  Neurological: Negative for dizziness and weakness.       Objective    BP 100/63 (BP Location: Left Arm, Patient Position: Sitting, Cuff Size: Normal)   Pulse 73   Temp 98.4 F (36.9 C) (Oral)   Resp 16   Wt 141 lb (64 kg)   SpO2 97%   BMI 24.20 kg/m  BP Readings from Last 3 Encounters:  11/03/19 100/63  11/03/19 123/75  08/05/19 105/66   Wt Readings from Last 3 Encounters:  11/03/19 141 lb (64 kg)  11/03/19 140 lb (63.5 kg)  08/05/19 143 lb (64.9 kg)      Physical Exam Vitals reviewed.  Constitutional:      Appearance: She is well-developed.  HENT:     Head: Normocephalic and atraumatic.     Right Ear: External ear normal.     Left Ear: External ear normal.     Nose: Nose normal.  Eyes:     General: No scleral icterus.    Conjunctiva/sclera: Conjunctivae normal.  Neck:     Thyroid: No thyromegaly.  Cardiovascular:     Rate and Rhythm: Normal rate and regular rhythm.     Heart sounds: Normal heart sounds.  Pulmonary:     Effort: Pulmonary effort is normal.     Breath sounds: Normal breath sounds.  Abdominal:     Palpations: Abdomen is soft.  Skin:    General: Skin is warm and dry.  Neurological:     General: No focal deficit present.     Mental Status: She is alert and oriented to person, place, and time.  Psychiatric:        Mood and Affect: Mood normal.        Behavior: Behavior normal.        Thought Content: Thought content normal.        Judgment: Judgment normal.       Results for orders placed or performed in visit on 11/03/19  Lactate dehydrogenase  Result Value Ref Range   LDH 162 98 - 192 U/L  Comprehensive metabolic  panel  Result Value Ref Range   Sodium 132 (L) 135 - 145 mmol/L   Potassium 3.4 (L) 3.5 - 5.1 mmol/L   Chloride 95 (L) 98 - 111 mmol/L   CO2 28 22 - 32 mmol/L   Glucose, Bld 93 70 - 99 mg/dL   BUN 14 8 - 23 mg/dL   Creatinine, Ser 1.06 (H) 0.44 - 1.00 mg/dL   Calcium 9.5 8.9 - 10.3 mg/dL   Total Protein 7.0 6.5 - 8.1 g/dL   Albumin 4.2 3.5 - 5.0 g/dL   AST 31 15 - 41 U/L  ALT 20 0 - 44 U/L   Alkaline Phosphatase 66 38 - 126 U/L   Total Bilirubin 1.4 (H) 0.3 - 1.2 mg/dL   GFR, Estimated 49 (L) >60 mL/min   Anion gap 9 5 - 15  CBC with Differential/Platelet  Result Value Ref Range   WBC 3.4 (L) 4.0 - 10.5 K/uL   RBC 4.12 3.87 - 5.11 MIL/uL   Hemoglobin 13.0 12.0 - 15.0 g/dL   HCT 37.1 36 - 46 %   MCV 90.0 80.0 - 100.0 fL   MCH 31.6 26.0 - 34.0 pg   MCHC 35.0 30.0 - 36.0 g/dL   RDW 13.7 11.5 - 15.5 %   Platelets 86 (L) 150 - 400 K/uL   nRBC 0.0 0.0 - 0.2 %   Neutrophils Relative % 55 %   Neutro Abs 1.9 1.7 - 7.7 K/uL   Lymphocytes Relative 29 %   Lymphs Abs 1.0 0.7 - 4.0 K/uL   Monocytes Relative 14 %   Monocytes Absolute 0.5 0.1 - 1.0 K/uL   Eosinophils Relative 1 %   Eosinophils Absolute 0.0 0.0 - 0.5 K/uL   Basophils Relative 1 %   Basophils Absolute 0.0 0.0 - 0.1 K/uL   Immature Granulocytes 0 %   Abs Immature Granulocytes 0.01 0.00 - 0.07 K/uL    Assessment & Plan     1. Adult hypothyroidism  - Lipid panel - TSH  2. Hypercholesteremia  - Lipid panel - TSH  3. Coronary artery disease involving coronary bypass graft of native heart without angina pectoris All risk factors treated. 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  5. Personal history of malignant neoplasm of breast    No follow-ups on file.      I, [name], MD, have reviewed all documentation for this visit. The documentation on 11/08/19 for the exam, diagnosis, procedures, and orders are all accurate and  complete.    Bayli Quesinberry Cranford Mon, MD  [facility] [phone] (phone) [phone] (fax)  [place]

## 2019-11-03 NOTE — Progress Notes (Signed)
[place] OFFICE PROGRESS NOTE  Patient Care Team: [name], MD as PCP - General (Unknown Physician Specialty) Bary Castilla, Forest Gleason, MD (General Surgery) Corey Skains, MD as Consulting Physician (Cardiology) Anell Barr, OD as Consulting Physician (Optometry) Cammie Sickle, MD as Consulting Physician (Internal Medicine)  Cancer Staging No matching staging information was found for the patient.   Oncology History Overview Note  1. Lymphoma low-grade, status Rituxan therapy. 2. Recurrent disease by clinical examination in December of 2012 3. Ovarian mass on PET scan in December of 2012 (normal CA 125). Ovarian mass has resolved after rituximab therapy. 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  Patient had total 5 cycles of chemotherapy with bendamustine and rituximab and 6 cycle was omitted because of significant side effects with and weakness and fatigue.  # OCT 2016- PET significant response; ON surveillance  # 18th OCT 2018- [place];SEP 2018-/OCT 2018 CT scan- progression; s/p 6 cycles [#6 in April 2019]  # JAN 2020-CT scan shows progressive disease; start a acalabrutinib 100mg  BID;# feb 6th2020- decrease dose of acalbrutinib to 100 mg/day [sec to spon bruising] March 1st week- STOPPED [place] sec to spon brusing  # MARCH 17th 2020- Start Venetoclax [out pt ramp up]; 300 mg a day    # 11/06/2016- colonoscopy [Dr.Byrnett- 2 polyps]  #History of A. Fib-Xarelto; CKD stage II-III.   # #Right posterior neck melanoma stage I [ Dr.Lee/Elkins]..   -------------------------------------------------------------------    # left ? Adnexal mass- ? Etiology; mild SUV uptake incidental [stable compared to previous PET in 2016] Previous  workup 2015- vaginal ultrasound negative. MRI February 2018- approximately 3 cm in size; slow increase in the size of the left adnexal mass over at least 3 years- suspect involvement of lymphoma rather than a primary gynecologic malignancy. --------------------------------------------------------------   DIAGNOSIS: SMALL LYMPHOCYTIC LEUKEMIA  STAGE:  IV    ;GOALS: control/pallaitive  CURRENT/MOST RECENT THERAPY: Venatoclax.    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  In the interim patient was evaluated by cardiology.  Recommended reducing the dose of Xarelto to 15 given her mild CKD.  Otherwise no other bleeding or easy bruising.  No worsening swelling in the legs.  No lumps or bumps.  No night sweats.  No weight loss.  Review of Systems  Constitutional: Positive for malaise/fatigue. Negative for chills, diaphoresis, fever and weight loss.  HENT: Negative for nosebleeds and sore throat.   Eyes: Negative for double vision.  Respiratory: Negative for cough, hemoptysis, sputum production, shortness of breath and wheezing.   Cardiovascular: Positive for leg swelling. Negative for chest pain, palpitations and orthopnea.  Gastrointestinal: Negative for abdominal pain, blood in stool, constipation, diarrhea, heartburn, melena and vomiting.  Genitourinary: Negative for dysuria, frequency and urgency.  Musculoskeletal: Negative for back pain and joint pain.  Neurological: Negative for dizziness, tingling, focal weakness, weakness and headaches.  Psychiatric/Behavioral: Negative for depression. The patient is not nervous/anxious and does not have insomnia.     PAST MEDICAL HISTORY :  Past Medical History:  Diagnosis Date  . A-fib ([place])   . Benign neoplasm of rectum and anal canal   .  Chronic leukemia of unspecified  cell type, without mention of having achieved remission   . Dysrhythmia    A-Fib  . Hypercholesteremia   . Hypertension   . Hyperthyroidism   . Lymphoma ([place]) 2007  . Non Hodgkin's lymphoma ([place]) 01/22/2006  . Personal history of chemotherapy     PAST SURGICAL HISTORY :   Past Surgical History:  Procedure Laterality Date  . ABDOMINAL HYSTERECTOMY  1958  . APPENDECTOMY    . BREAST BIOPSY Right 1970   right breast biopsy with clip- neg  . BREAST BIOPSY Left 2002   left breast biopsy with clip-neg  . BREAST BIOPSY Right 2014   right breast biopsy with clip-neg  . BREAST CYST ASPIRATION Bilateral 2000   bilateral fine needle aspiration  . BREAST SURGERY Left 1990   biopsy  . BREAST SURGERY Right May 08, 2012   complex fibroadenoma without malignancy.  [name] CARDIAC CATHETERIZATION  2014  . COLONOSCOPY  2009, 2012    hyperplastic rectal polyp 2012 tubular adenoma of proximal ascending colon 2000 9 repeat exam due to 2017.  [name] COLONOSCOPY WITH PROPOFOL N/A 11/06/2016   Procedure: COLONOSCOPY WITH PROPOFOL;  Surgeon: Robert Bellow, MD;  Location: [facility] ENDOSCOPY;  Service: Endoscopy;  Laterality: N/A;  . COLONOSCOPY WITH PROPOFOL N/A 02/27/2019   Procedure: COLONOSCOPY WITH PROPOFOL;  Surgeon: Robert Bellow, MD;  Location: [place] ENDOSCOPY;  Service: General;  Laterality: N/A;  . CORONARY ARTERY BYPASS GRAFT  1999  . FLEXIBLE SIGMOIDOSCOPY  1998    FAMILY HISTORY :   Family History  Problem Relation Age of Onset  . Lung cancer Father        Died age 23  . Heart disease Mother        Died age 54  . Asthma Mother   . CAD Brother        double bypass  . Heart attack Sister   . Tuberculosis Sister        3 years old  . Lung cancer Sister        Died in her 71s  . Angina Sister   . Breast cancer Sister 39  . Rheum arthritis Sister   . Aneurysm Sister        Brain, died age 54  . Angina Sister   . Heart disease Brother        Stent placement  . Heart disease  Brother        stent placement  . Cancer Other        breast cancer niece, pancreatic cancer niece  . Breast cancer Other   . Ovarian cancer Neg Hx   . Diabetes Neg Hx     SOCIAL HISTORY:   Social History   Tobacco Use  . Smoking status: Never Smoker  . Smokeless tobacco: Never Used  Vaping Use  . Vaping Use: Never used  Substance Use Topics  . Alcohol use: Yes    Alcohol/week: 0.0 - 7.0 standard drinks    Comment: 1 drink a day of wine or cocktail  . Drug use: No    ALLERGIES:  is allergic to pravastatin.  MEDICATIONS:  Current Outpatient Medications  Medication Sig Dispense Refill  . Docusate Calcium (STOOL SOFTENER PO) Take by mouth as needed.     . ezetimibe (ZETIA) 10 MG tablet Take 1 tablet (10 mg total) daily by mouth. 30 tablet 12  . isosorbide mononitrate (IMDUR) 30 MG 24 hr tablet Take 30 mg by mouth daily.     [name]  levothyroxine (SYNTHROID) 88 MCG tablet TAKE 1 TABLET BY MOUTH  DAILY BEFORE BREAKFAST 90 tablet 3  . lisinopril-hydrochlorothiazide (ZESTORETIC) 20-12.5 MG tablet TAKE 1 TABLET BY MOUTH  DAILY 90 tablet 1  . Melatonin 1 MG CAPS Take 1 capsule by mouth at bedtime.     . metoprolol succinate (TOPROL-XL) 25 MG 24 hr tablet Take 25 mg by mouth daily.     . ondansetron (ZOFRAN) 4 MG tablet Take 1 tablet (4 mg total) by mouth every 8 (eight) hours as needed for nausea or vomiting. 40 tablet 3  . oxybutynin (DITROPAN) 5 MG tablet Take 1 tablet (5 mg total) by mouth 2 (two) times daily. 180 tablet 3  . rosuvastatin (CRESTOR) 5 MG tablet Take 5 mg by mouth in the morning, at noon, and at bedtime.    . VENCLEXTA 100 MG TABS TAKE 3 TABLETS BY MOUTH DAILY 90 tablet 2  . XARELTO 20 MG TABS tablet Take 20 mg by mouth daily with supper.     [name] allopurinol (ZYLOPRIM) 300 MG tablet TAKE 1 TABLET(300 MG) BY MOUTH TWICE DAILY (Patient not taking: Reported on 08/05/2019) 180 tablet 3   No current facility-administered medications for this visit.   Facility-Administered  Medications Ordered in Other Visits  Medication Dose Route Frequency Provider Last Rate Last Admin  . heparin lock flush 100 unit/mL  500 Units Intravenous Once Charlaine Dalton R, MD      . sodium chloride flush (NS) 0.9 % injection 10 mL  10 mL Intravenous PRN Cammie Sickle, MD        PHYSICAL EXAMINATION: ECOG PERFORMANCE STATUS: 1 - Symptomatic but completely ambulatory  BP 123/75 (BP Location: Left Arm, Patient Position: Sitting, Cuff Size: Normal)   Pulse 68   Temp (!) 97.5 F (36.4 C) (Tympanic)   Resp 16   Ht 5\' 4"  (1.626 m)   Wt 140 lb (63.5 kg)   SpO2 99%   BMI 24.03 kg/m   Filed Weights   11/03/19 1017  Weight: 140 lb (63.5 kg)    Physical Exam Constitutional:      Comments: Walking independently.  Accompanied by her husband.  HENT:     Head: Normocephalic and atraumatic.     Mouth/Throat:     Pharynx: No oropharyngeal exudate.  Eyes:     Pupils: Pupils are equal, round, and reactive to light.  Cardiovascular:     Rate and Rhythm: Normal rate and regular rhythm.  Pulmonary:     Effort: No respiratory distress.     Breath sounds: No wheezing.  Abdominal:     General: Bowel sounds are normal. There is no distension.     Palpations: Abdomen is soft. There is no mass.     Tenderness: There is no abdominal tenderness. There is no guarding or rebound.  Musculoskeletal:        General: No tenderness. Normal range of motion.     Cervical back: Normal range of motion and neck supple.  Skin:    General: Skin is warm.  Neurological:     Mental Status: She is alert and oriented to person, place, and time.  Psychiatric:        Mood and Affect: Affect normal.     LABORATORY DATA:  I have reviewed the data as listed    Component Value Date/Time   NA 132 (L) 11/03/2019 0944   NA 139 07/03/2017 1425   NA 141 05/11/2014 0853   K 3.4 (L) 11/03/2019 5681  K 3.6 05/11/2014 0853   CL 95 (L) 11/03/2019 0944   CL 103 05/11/2014 0853   CO2 28 11/03/2019  0944   CO2 29 05/11/2014 0853   GLUCOSE 93 11/03/2019 0944   GLUCOSE 107 (H) 05/11/2014 0853   BUN 14 11/03/2019 0944   BUN 19 07/03/2017 1425   BUN 22 (H) 05/11/2014 0853   CREATININE 1.06 (H) 11/03/2019 0944   CREATININE 1.07 (H) 05/11/2014 0853   CALCIUM 9.5 11/03/2019 0944   CALCIUM 10.1 05/11/2014 0853   PROT 7.0 11/03/2019 0944   PROT 6.4 07/03/2017 1425   PROT 7.3 05/11/2014 0853   ALBUMIN 4.2 11/03/2019 0944   ALBUMIN 4.3 07/03/2017 1425   ALBUMIN 4.7 05/11/2014 0853   AST 31 11/03/2019 0944   AST 29 05/11/2014 0853   ALT 20 11/03/2019 0944   ALT 20 05/11/2014 0853   ALKPHOS 66 11/03/2019 0944   ALKPHOS 47 05/11/2014 0853   BILITOT 1.4 (H) 11/03/2019 0944   BILITOT 0.5 07/03/2017 1425   BILITOT 1.0 05/11/2014 0853   GFRNONAA 49 (L) 11/03/2019 0944   GFRNONAA 51 (L) 05/11/2014 0853   GFRAA >60 08/05/2019 0941   GFRAA 59 (L) 05/11/2014 0853    No results found for: SPEP, UPEP  Lab Results  Component Value Date   WBC 3.4 (L) 11/03/2019   NEUTROABS 1.9 11/03/2019   HGB 13.0 11/03/2019   HCT 37.1 11/03/2019   MCV 90.0 11/03/2019   PLT 86 (L) 11/03/2019      Chemistry      Component Value Date/Time   NA 132 (L) 11/03/2019 0944   NA 139 07/03/2017 1425   NA 141 05/11/2014 0853   K 3.4 (L) 11/03/2019 0944   K 3.6 05/11/2014 0853   CL 95 (L) 11/03/2019 0944   CL 103 05/11/2014 0853   CO2 28 11/03/2019 0944   CO2 29 05/11/2014 0853   BUN 14 11/03/2019 0944   BUN 19 07/03/2017 1425   BUN 22 (H) 05/11/2014 0853   CREATININE 1.06 (H) 11/03/2019 0944   CREATININE 1.07 (H) 05/11/2014 0853   GLU 98 05/25/2013 0000      Component Value Date/Time   CALCIUM 9.5 11/03/2019 0944   CALCIUM 10.1 05/11/2014 0853   ALKPHOS 66 11/03/2019 0944   ALKPHOS 47 05/11/2014 0853   AST 31 11/03/2019 0944   AST 29 05/11/2014 0853   ALT 20 11/03/2019 0944   ALT 20 05/11/2014 0853   BILITOT 1.4 (H) 11/03/2019 0944   BILITOT 0.5 07/03/2017 1425   BILITOT 1.0 05/11/2014 0853        RADIOGRAPHIC STUDIES: I have personally reviewed the radiological images as listed and agreed with the findings in the report. No results found.   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 Currently on venetoclax-[March 2020.].STABLE.  Hold any imaging at this  #Currently on Venclexta 300 mg a day; today white count is 3.4 ANC 1.9 platelets -80s hemoglobin 13 continue venetoclax to 300 mg once a day.  For now continue current therapy.  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    # A. Fib-stable continue Xarelto [Dr.Kowalski]. Stable; I think it is reasonable to renally adjust Xarelto 15 mg/day [GFR ~49 today;CKD-III].  However patient wants to finish her 63-month supply of current  dose of Xarelto 20 mg a day.  # DISPOSITION: # follow up in 3 months-MD: labs- cbc/cmp/ldh; Dr.B       No orders of the defined types were placed in this encounter.  All questions were answered. The patient knows to call the clinic with any problems, questions or concerns.      Cammie Sickle, MD 11/03/2019 12:25 PM

## 2019-11-03 NOTE — Assessment & Plan Note (Addendum)
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 Currently on venetoclax-[March 2020.].STABLE.  Hold any imaging at this  #Currently on Venclexta 300 mg a day; today white count is 3.4 ANC 1.9 platelets -80s hemoglobin 13 continue venetoclax to 300 mg once a day.  For now continue current therapy.  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    # A. Fib-stable continue Xarelto [Dr.Kowalski]. Stable; I think it is reasonable to renally adjust Xarelto 15 mg/day [GFR ~49 today;CKD-III].  However patient wants to finish her 81-month supply of current dose of Xarelto 20 mg a day.  # DISPOSITION: # follow up in 3 months-MD: labs- cbc/cmp/ldh; Dr.B

## 2019-11-04 ENCOUNTER — Ambulatory Visit: Payer: Self-pay | Admitting: Family Medicine

## 2019-11-04 DIAGNOSIS — I25810 Atherosclerosis of coronary artery bypass graft(s) without angina pectoris: Secondary | ICD-10-CM | POA: Diagnosis not present

## 2019-11-04 DIAGNOSIS — E039 Hypothyroidism, unspecified: Secondary | ICD-10-CM | POA: Diagnosis not present

## 2019-11-04 DIAGNOSIS — E7800 Pure hypercholesterolemia, unspecified: Secondary | ICD-10-CM | POA: Diagnosis not present

## 2019-11-04 DIAGNOSIS — Z23 Encounter for immunization: Secondary | ICD-10-CM | POA: Diagnosis not present

## 2019-11-05 LAB — LIPID PANEL
Chol/HDL Ratio: 1.9 ratio (ref 0.0–4.4)
Cholesterol, Total: 165 mg/dL (ref 100–199)
HDL: 87 mg/dL (ref >39)
LDL Chol Calc (NIH): 68 mg/dL (ref 0–99)
Triglycerides: 50 mg/dL (ref 0–149)
VLDL Cholesterol Cal: 10 mg/dL (ref 5–40)

## 2019-11-05 LAB — TSH: TSH: 0.765 u[IU]/mL (ref 0.450–4.500)

## 2019-11-09 MED FILL — VENCLEXTA 100 MG TABS: 100 | 30 days supply | Qty: 90 | Fill #1

## 2019-11-11 DIAGNOSIS — C44729 Squamous cell carcinoma of skin of left lower limb, including hip: Secondary | ICD-10-CM | POA: Diagnosis not present

## 2019-12-06 ENCOUNTER — Other Ambulatory Visit: Payer: Self-pay | Admitting: Family Medicine

## 2019-12-06 NOTE — Telephone Encounter (Signed)
Requested Prescriptions  Pending Prescriptions Disp Refills  . lisinopril-hydrochlorothiazide (ZESTORETIC) 20-12.5 MG tablet [Pharmacy Med Name: Lisinopril-hydroCHLOROthiazide 20-12.5 MG Oral Tablet] 90 tablet 1    Sig: TAKE 1 TABLET BY MOUTH  DAILY     Cardiovascular:  ACEI + Diuretic Combos Failed - 12/06/2019  7:43 AM      Failed - Na in normal range and within 180 days    Sodium  Date Value Ref Range Status  11/03/2019 132 (L) 135 - 145 mmol/L Final  07/03/2017 139 134 - 144 mmol/L Final  05/11/2014 141 mmol/L Final    Comment:    135-145 NOTE: New Reference Range  03/30/14          Failed - K in normal range and within 180 days    Potassium  Date Value Ref Range Status  11/03/2019 3.4 (L) 3.5 - 5.1 mmol/L Final  05/11/2014 3.6 mmol/L Final    Comment:    3.5-5.1 NOTE: New Reference Range  03/30/14          Failed - Cr in normal range and within 180 days    Creatinine  Date Value Ref Range Status  05/11/2014 1.07 (H) mg/dL Final    Comment:    0.44-1.00 NOTE: New Reference Range  03/30/14    Creatinine, Ser  Date Value Ref Range Status  11/03/2019 1.06 (H) 0.44 - 1.00 mg/dL Final         Passed - Ca in normal range and within 180 days    Calcium  Date Value Ref Range Status  11/03/2019 9.5 8.9 - 10.3 mg/dL Final   Calcium, Total  Date Value Ref Range Status  05/11/2014 10.1 mg/dL Final    Comment:    8.9-10.3 NOTE: New Reference Range  03/30/14          Passed - Patient is not pregnant      Passed - Last BP in normal range    BP Readings from Last 1 Encounters:  11/03/19 100/63         Passed - Valid encounter within last 6 months    Recent Outpatient Visits          1 month ago Adult hypothyroidism   [facility] [name], MD   7 months ago Adult hypothyroidism   [facility] [name], MD   1 year ago 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

## 2019-12-10 MED FILL — VENCLEXTA 100 MG TABS: 100 | 30 days supply | Qty: 90 | Fill #2

## 2019-12-30 ENCOUNTER — Telehealth: Payer: Self-pay | Admitting: Pharmacy Technician

## 2020-01-04 NOTE — Telephone Encounter (Signed)
Oral Oncology Patient Advocate Encounter   Applied for renewal of grant for 2022.  Patient is conditionally approved for the grant pending income documentation.  Jacqueline Webb would not start until 01/25/20 and documentation must be submitted by 01/21/20.  Spoke with Mrs. Bousquet to let her know we would need to submit her tax return to [facility] so they could verify her income. She will fax or drop off a copy of the tax return.  [place] Patient [place] Phone [phone] Fax [phone] 01/04/2020 11:10 AM

## 2020-01-05 ENCOUNTER — Other Ambulatory Visit: Payer: Self-pay | Admitting: Internal Medicine

## 2020-01-05 DIAGNOSIS — C8308 Small cell B-cell lymphoma, lymph nodes of multiple sites: Secondary | ICD-10-CM

## 2020-01-11 ENCOUNTER — Other Ambulatory Visit: Payer: Self-pay

## 2020-01-11 ENCOUNTER — Ambulatory Visit (INDEPENDENT_AMBULATORY_CARE_PROVIDER_SITE_OTHER): Payer: Medicare Other | Admitting: Family Medicine

## 2020-01-11 DIAGNOSIS — Z23 Encounter for immunization: Secondary | ICD-10-CM | POA: Diagnosis not present

## 2020-01-11 NOTE — Telephone Encounter (Signed)
Oral Oncology Patient Advocate Encounter  Jacqueline Webb stopped by the office today and brought her income documentation.  I faxed income documentation to [place] [phone]).  [place] Patient [place] Phone [phone] Fax [phone] 01/11/2020 11:53 AM

## 2020-01-13 MED FILL — VENCLEXTA 100 MG TABS: 100 | 30 days supply | Qty: 90 | Fill #0

## 2020-01-19 DIAGNOSIS — H02839 Dermatochalasis of unspecified eye, unspecified eyelid: Secondary | ICD-10-CM | POA: Diagnosis not present

## 2020-01-19 DIAGNOSIS — H5340 Unspecified visual field defects: Secondary | ICD-10-CM | POA: Diagnosis not present

## 2020-01-19 DIAGNOSIS — H02401 Unspecified ptosis of right eyelid: Secondary | ICD-10-CM | POA: Diagnosis not present

## 2020-01-20 DIAGNOSIS — H02839 Dermatochalasis of unspecified eye, unspecified eyelid: Secondary | ICD-10-CM | POA: Insufficient documentation

## 2020-01-20 DIAGNOSIS — H02401 Unspecified ptosis of right eyelid: Secondary | ICD-10-CM | POA: Insufficient documentation

## 2020-01-20 DIAGNOSIS — H5340 Unspecified visual field defects: Secondary | ICD-10-CM | POA: Insufficient documentation

## 2020-01-20 NOTE — Telephone Encounter (Signed)
I called the [employer] to verify they received the income documents that were faxed.  Rep stated the documents were received and Jacqueline Webb's renewal grant for January is still approved.  I called and informed Jacqueline Webb.  [name] [facility] Patient Advocate [facility] Phone [phone] Fax [phone] 01/20/2020 2:02 PM

## 2020-02-02 ENCOUNTER — Inpatient Hospital Stay: Payer: Medicare Other | Attending: Internal Medicine

## 2020-02-02 ENCOUNTER — Encounter: Payer: Self-pay | Admitting: Internal Medicine

## 2020-02-02 ENCOUNTER — Inpatient Hospital Stay (HOSPITAL_BASED_OUTPATIENT_CLINIC_OR_DEPARTMENT_OTHER): Payer: Medicare Other | Admitting: Internal Medicine

## 2020-02-02 ENCOUNTER — Other Ambulatory Visit: Payer: Self-pay

## 2020-02-02 VITALS — BP 138/82 | HR 75 | Temp 98.3°F | Resp 20 | Ht 64.0 in | Wt 138.0 lb

## 2020-02-02 DIAGNOSIS — I4891 Unspecified atrial fibrillation: Secondary | ICD-10-CM | POA: Insufficient documentation

## 2020-02-02 DIAGNOSIS — Z8261 Family history of arthritis: Secondary | ICD-10-CM | POA: Diagnosis not present

## 2020-02-02 DIAGNOSIS — Z90710 Acquired absence of both cervix and uterus: Secondary | ICD-10-CM | POA: Diagnosis not present

## 2020-02-02 DIAGNOSIS — Z833 Family history of diabetes mellitus: Secondary | ICD-10-CM | POA: Diagnosis not present

## 2020-02-02 DIAGNOSIS — Z79899 Other long term (current) drug therapy: Secondary | ICD-10-CM | POA: Diagnosis not present

## 2020-02-02 DIAGNOSIS — Z8249 Family history of ischemic heart disease and other diseases of the circulatory system: Secondary | ICD-10-CM | POA: Insufficient documentation

## 2020-02-02 DIAGNOSIS — C8308 Small cell B-cell lymphoma, lymph nodes of multiple sites: Secondary | ICD-10-CM

## 2020-02-02 DIAGNOSIS — Z801 Family history of malignant neoplasm of trachea, bronchus and lung: Secondary | ICD-10-CM | POA: Insufficient documentation

## 2020-02-02 DIAGNOSIS — D696 Thrombocytopenia, unspecified: Secondary | ICD-10-CM | POA: Insufficient documentation

## 2020-02-02 DIAGNOSIS — Z803 Family history of malignant neoplasm of breast: Secondary | ICD-10-CM | POA: Diagnosis not present

## 2020-02-02 DIAGNOSIS — E7800 Pure hypercholesterolemia, unspecified: Secondary | ICD-10-CM | POA: Diagnosis not present

## 2020-02-02 DIAGNOSIS — Z9221 Personal history of antineoplastic chemotherapy: Secondary | ICD-10-CM | POA: Insufficient documentation

## 2020-02-02 DIAGNOSIS — Z7901 Long term (current) use of anticoagulants: Secondary | ICD-10-CM | POA: Diagnosis not present

## 2020-02-02 DIAGNOSIS — Z85820 Personal history of malignant melanoma of skin: Secondary | ICD-10-CM | POA: Insufficient documentation

## 2020-02-02 DIAGNOSIS — I10 Essential (primary) hypertension: Secondary | ICD-10-CM | POA: Insufficient documentation

## 2020-02-02 LAB — COMPREHENSIVE METABOLIC PANEL
ALT: 25 U/L (ref 0–44)
AST: 34 U/L (ref 15–41)
Albumin: 4.2 g/dL (ref 3.5–5.0)
Alkaline Phosphatase: 59 U/L (ref 38–126)
Anion gap: 9 (ref 5–15)
BUN: 13 mg/dL (ref 8–23)
CO2: 28 mmol/L (ref 22–32)
Calcium: 9.6 mg/dL (ref 8.9–10.3)
Chloride: 98 mmol/L (ref 98–111)
Creatinine, Ser: 0.83 mg/dL (ref 0.44–1.00)
GFR, Estimated: >60 mL/min (ref >60)
Glucose, Bld: 89 mg/dL (ref 70–99)
Potassium: 3.3 mmol/L — ABNORMAL LOW (ref 3.5–5.1)
Sodium: 135 mmol/L (ref 135–145)
Total Bilirubin: 1.1 mg/dL (ref 0.3–1.2)
Total Protein: 7.2 g/dL (ref 6.5–8.1)

## 2020-02-02 LAB — CBC WITH DIFFERENTIAL/PLATELET
Abs Immature Granulocytes: 0 10*3/uL (ref 0.00–0.07)
Basophils Absolute: 0 10*3/uL (ref 0.0–0.1)
Basophils Relative: 1 %
Eosinophils Absolute: 0 10*3/uL (ref 0.0–0.5)
Eosinophils Relative: 0 %
HCT: 38.8 % (ref 36.0–46.0)
Hemoglobin: 13.7 g/dL (ref 12.0–15.0)
Immature Granulocytes: 0 %
Lymphocytes Relative: 32 %
Lymphs Abs: 1 10*3/uL (ref 0.7–4.0)
MCH: 32.3 pg (ref 26.0–34.0)
MCHC: 35.3 g/dL (ref 30.0–36.0)
MCV: 91.5 fL (ref 80.0–100.0)
Monocytes Absolute: 0.4 10*3/uL (ref 0.1–1.0)
Monocytes Relative: 14 %
Neutro Abs: 1.7 10*3/uL (ref 1.7–7.7)
Neutrophils Relative %: 53 %
Platelets: 71 10*3/uL — ABNORMAL LOW (ref 150–400)
RBC: 4.24 MIL/uL (ref 3.87–5.11)
RDW: 14.1 % (ref 11.5–15.5)
WBC: 3.2 10*3/uL — ABNORMAL LOW (ref 4.0–10.5)
nRBC: 0 % (ref 0.0–0.2)

## 2020-02-02 LAB — LACTATE DEHYDROGENASE: LDH: 158 U/L (ref 98–192)

## 2020-02-02 NOTE — Progress Notes (Unsigned)
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 Lymphoma low-grade, status Rituxan therapy. 2. Recurrent disease by clinical examination in December of 2012 3. Ovarian mass on PET scan in December of 2012 (normal CA 125). Ovarian mass has resolved after rituximab therapy. 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  Patient had total 5 cycles of chemotherapy with bendamustine and rituximab and 6 cycle was omitted because of significant side effects with and weakness and fatigue.  # OCT 2016- PET significant response; ON surveillance  # 18th OCT 2018- [place];SEP 2018-/OCT 2018 CT scan- progression; s/p 6 cycles [#6 in April 2019]  # JAN 2020-CT scan shows progressive disease; start a acalabrutinib 100mg  BID;# feb 6th2020- decrease dose of acalbrutinib to 100 mg/day [sec to spon bruising] March 1st week- STOPPED [place] sec to spon brusing  # MARCH 17th 2020- Start Venetoclax [out pt ramp up]; 300 mg a day    # 11/06/2016- colonoscopy [Dr.Byrnett- 2 polyps]  #History of A. Fib-Xarelto; CKD stage II-III.   # #Right posterior neck melanoma stage I [ Dr.Lee/Elkins]..    -------------------------------------------------------------------    # left ? Adnexal mass- ? Etiology; mild SUV uptake incidental [stable compared to previous PET in 2016] Previous workup 2015- vaginal ultrasound negative. MRI February 2018- approximately 3 cm in size; slow increase in the size of the left adnexal mass over at least 3 years- suspect involvement of lymphoma rather than a primary gynecologic malignancy. --------------------------------------------------------------   DIAGNOSIS: SMALL LYMPHOCYTIC LEUKEMIA  STAGE:  IV    ;GOALS: control/pallaitive  CURRENT/MOST RECENT THERAPY: Venatoclax.    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  Patient denies any recent UTIs. Denies any recent admission to hospital. No new lumps or bumps. No night sweats. She continues to be on Xarelto.  Review of Systems  Constitutional: Positive for malaise/fatigue. Negative for chills, diaphoresis, fever and weight loss.  HENT: Negative for nosebleeds and sore throat.   Eyes: Negative for double vision.  Respiratory: Negative for cough, hemoptysis, sputum production, shortness of breath and wheezing.   Cardiovascular: Positive for leg swelling. Negative for chest pain, palpitations and orthopnea.  Gastrointestinal: Negative for abdominal pain, blood in stool, constipation, diarrhea, heartburn, melena and vomiting.  Genitourinary: Negative for dysuria, frequency and urgency.  Musculoskeletal: Negative for back pain and joint pain.  Neurological: Negative for dizziness, tingling, focal weakness, weakness and headaches.   Psychiatric/Behavioral: Negative for depression. The patient  is not nervous/anxious and does not have insomnia.     PAST MEDICAL HISTORY :  Past Medical History:  Diagnosis Date  . A-fib ([place])   . Benign neoplasm of rectum and anal canal   . 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 Dysrhythmia    A-Fib  . Hypercholesteremia   . Hypertension   . Hyperthyroidism   . Lymphoma ([place]) 2007  . Non Hodgkin's lymphoma ([place]) 01/22/2006  . Personal history of chemotherapy     PAST SURGICAL HISTORY :   Past Surgical History:  Procedure Laterality Date  . ABDOMINAL HYSTERECTOMY  1958  . APPENDECTOMY    . BREAST BIOPSY Right 1970   right breast biopsy with clip- neg  . BREAST BIOPSY Left 2002   left breast biopsy with clip-neg  . BREAST BIOPSY Right 2014   right breast biopsy with clip-neg  . BREAST CYST ASPIRATION Bilateral 2000   bilateral fine needle aspiration  . BREAST SURGERY Left 1990   biopsy  . BREAST SURGERY Right May 08, 2012   complex fibroadenoma without malignancy.  [name] CARDIAC CATHETERIZATION  2014  . COLONOSCOPY  2009, 2012    hyperplastic rectal polyp 2012 tubular adenoma of proximal ascending colon 2000 9 repeat exam due to 2017.  [name] COLONOSCOPY WITH PROPOFOL N/A 11/06/2016   Procedure: COLONOSCOPY WITH PROPOFOL;  Surgeon: Robert Bellow, MD;  Location: [facility] ENDOSCOPY;  Service: Endoscopy;  Laterality: N/A;  . COLONOSCOPY WITH PROPOFOL N/A 02/27/2019   Procedure: COLONOSCOPY WITH PROPOFOL;  Surgeon: Robert Bellow, MD;  Location: [facility];  Service: General;  Laterality: N/A;  . CORONARY ARTERY BYPASS GRAFT  1999  . FLEXIBLE SIGMOIDOSCOPY  1998    FAMILY HISTORY :   Family History  Problem Relation Age of Onset  . Lung cancer Father        Died age 66  . Heart disease Mother        Died age 19  . Asthma Mother   . CAD Brother        double bypass  . Heart attack Sister   . Tuberculosis Sister         31 years old  . Lung cancer Sister        Died in her 65s  . Angina Sister   . Breast cancer Sister 78  . Rheum arthritis Sister   . Aneurysm Sister        Brain, died age 10  . Angina Sister   . Heart disease Brother        Stent placement  . Heart disease Brother        stent placement  . Cancer Other        breast cancer niece, pancreatic cancer niece  . Breast cancer Other   . Ovarian cancer Neg Hx   . Diabetes Neg Hx     SOCIAL HISTORY:   Social History   Tobacco Use  . Smoking status: Never Smoker  . Smokeless tobacco: Never Used  Vaping Use  . Vaping Use: Never used  Substance Use Topics  . Alcohol use: Yes    Alcohol/week: 0.0 - 7.0 standard drinks    Comment: 1 drink a day of wine or cocktail  . Drug use: No    ALLERGIES:  is allergic to pravastatin.  MEDICATIONS:  Current Outpatient Medications  Medication Sig Dispense Refill  . allopurinol (ZYLOPRIM) 300 MG tablet TAKE 1 TABLET(300 MG)  BY MOUTH TWICE DAILY 180 tablet 3  . Docusate Calcium (STOOL SOFTENER PO) Take by mouth as needed.     . ezetimibe (ZETIA) 10 MG tablet Take 1 tablet (10 mg total) daily by mouth. 30 tablet 12  . isosorbide mononitrate (IMDUR) 30 MG 24 hr tablet Take 30 mg by mouth daily.     [name] levothyroxine (SYNTHROID) 88 MCG tablet TAKE 1 TABLET BY MOUTH  DAILY BEFORE BREAKFAST (Patient taking differently: TAKE 1 TABLET BY MOUTH  DAILY BEFORE BREAKFAST) 90 tablet 3  . lisinopril-hydrochlorothiazide (ZESTORETIC) 20-12.5 MG tablet TAKE 1 TABLET BY MOUTH  DAILY 90 tablet 1  . melatonin 5 MG TABS Take 5 mg by mouth.    . metoprolol succinate (TOPROL-XL) 25 MG 24 hr tablet Take 25 mg by mouth daily.     [name] oxybutynin (DITROPAN) 5 MG tablet Take 1 tablet (5 mg total) by mouth 2 (two) times daily. 180 tablet 3  . rosuvastatin (CRESTOR) 5 MG tablet Take 5 mg by mouth daily. Takes 3 times a week    . VENCLEXTA 100 MG tablet TAKE 3 TABLETS BY MOUTH DAILY 90 tablet 2  . XARELTO 15 MG TABS  tablet Take 15 mg by mouth daily.    . ondansetron (ZOFRAN) 4 MG tablet Take 1 tablet (4 mg total) by mouth every 8 (eight) hours as needed for nausea or vomiting. (Patient not taking: Reported on 02/02/2020) 40 tablet 3   No current facility-administered medications for this visit.   Facility-Administered Medications Ordered in Other Visits  Medication Dose Route Frequency Provider Last Rate Last Admin  . heparin lock flush 100 unit/mL  500 Units Intravenous Once Charlaine Dalton R, MD      . sodium chloride flush (NS) 0.9 % injection 10 mL  10 mL Intravenous PRN Cammie Sickle, MD        PHYSICAL EXAMINATION: ECOG PERFORMANCE STATUS: 1 - Symptomatic but completely ambulatory  BP 138/82   Pulse 75   Temp 98.3 F (36.8 C) (Tympanic)   Resp 20   Ht 5\' 4"  (1.626 m)   Wt 138 lb (62.6 kg)   BMI 23.69 kg/m   Filed Weights   02/02/20 0947  Weight: 138 lb (62.6 kg)    Physical Exam Constitutional:      Comments: Walking independently.  Accompanied by her husband.  HENT:     Head: Normocephalic and atraumatic.     Mouth/Throat:     Pharynx: No oropharyngeal exudate.  Eyes:     Pupils: Pupils are equal, round, and reactive to light.  Cardiovascular:     Rate and Rhythm: Normal rate and regular rhythm.  Pulmonary:     Effort: No respiratory distress.     Breath sounds: No wheezing.  Abdominal:     General: Bowel sounds are normal. There is no distension.     Palpations: Abdomen is soft. There is no mass.     Tenderness: There is no abdominal tenderness. There is no guarding or rebound.  Musculoskeletal:        General: No tenderness. Normal range of motion.     Cervical back: Normal range of motion and neck supple.  Skin:    General: Skin is warm.  Neurological:     Mental Status: She is alert and oriented to person, place, and time.  Psychiatric:        Mood and Affect: Affect normal.     LABORATORY DATA:  I have reviewed the data as listed  Component  Value Date/Time   NA 135 02/02/2020 0939   NA 139 07/03/2017 1425   NA 141 05/11/2014 0853   K 3.3 (L) 02/02/2020 0939   K 3.6 05/11/2014 0853   CL 98 02/02/2020 0939   CL 103 05/11/2014 0853   CO2 28 02/02/2020 0939   CO2 29 05/11/2014 0853   GLUCOSE 89 02/02/2020 0939   GLUCOSE 107 (H) 05/11/2014 0853   BUN 13 02/02/2020 0939   BUN 19 07/03/2017 1425   BUN 22 (H) 05/11/2014 0853   CREATININE 0.83 02/02/2020 0939   CREATININE 1.07 (H) 05/11/2014 0853   CALCIUM 9.6 02/02/2020 0939   CALCIUM 10.1 05/11/2014 0853   PROT 7.2 02/02/2020 0939   PROT 6.4 07/03/2017 1425   PROT 7.3 05/11/2014 0853   ALBUMIN 4.2 02/02/2020 0939   ALBUMIN 4.3 07/03/2017 1425   ALBUMIN 4.7 05/11/2014 0853   AST 34 02/02/2020 0939   AST 29 05/11/2014 0853   ALT 25 02/02/2020 0939   ALT 20 05/11/2014 0853   ALKPHOS 59 02/02/2020 0939   ALKPHOS 47 05/11/2014 0853   BILITOT 1.1 02/02/2020 0939   BILITOT 0.5 07/03/2017 1425   BILITOT 1.0 05/11/2014 0853   GFRNONAA >60 02/02/2020 0939   GFRNONAA 51 (L) 05/11/2014 0853   GFRAA >60 08/05/2019 0941   GFRAA 59 (L) 05/11/2014 0853    No results found for: SPEP, UPEP  Lab Results  Component Value Date   WBC 3.2 (L) 02/02/2020   NEUTROABS 1.7 02/02/2020   HGB 13.7 02/02/2020   HCT 38.8 02/02/2020   MCV 91.5 02/02/2020   PLT 71 (L) 02/02/2020      Chemistry      Component Value Date/Time   NA 135 02/02/2020 0939   NA 139 07/03/2017 1425   NA 141 05/11/2014 0853   K 3.3 (L) 02/02/2020 0939   K 3.6 05/11/2014 0853   CL 98 02/02/2020 0939   CL 103 05/11/2014 0853   CO2 28 02/02/2020 0939   CO2 29 05/11/2014 0853   BUN 13 02/02/2020 0939   BUN 19 07/03/2017 1425   BUN 22 (H) 05/11/2014 0853   CREATININE 0.83 02/02/2020 0939   CREATININE 1.07 (H) 05/11/2014 0853   GLU 98 05/25/2013 0000      Component Value Date/Time   CALCIUM 9.6 02/02/2020 0939   CALCIUM 10.1 05/11/2014 0853   ALKPHOS 59 02/02/2020 0939   ALKPHOS 47 05/11/2014 0853    AST 34 02/02/2020 0939   AST 29 05/11/2014 0853   ALT 25 02/02/2020 0939   ALT 20 05/11/2014 0853   BILITOT 1.1 02/02/2020 0939   BILITOT 0.5 07/03/2017 1425   BILITOT 1.0 05/11/2014 0853       RADIOGRAPHIC STUDIES: I have personally reviewed the radiological images as listed and agreed with the findings in the report. No results found.   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 Currently on venetoclax-[March 2020.]. STABLE.  #Currently on Venclexta 300 mg a day; today white count is 3.4 ANC 1.7 platelets -71; hemoglobin 13 continue venetoclax to 300 mg once a day.  For now continue current therapy. We will plan CT scan chest and pelvis prior to next visit.  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     #  A. Fib-stable continue Xarelto [Dr.Kowalski].STABLE; on renally adjusted Xarelto 15 mg/day.  # DISPOSITION: # follow up in 3 months-MD: labs- cbc/cmp/ldh;CT C/A/P prior- Dr.B       Orders Placed This Encounter  Procedures  . CT CHEST ABDOMEN PELVIS W CONTRAST    Standing Status:   Future    Standing Expiration Date:   02/01/2021    Order Specific Question:   Preferred imaging location?    Answer:   [facility]    Order Specific Question:   Radiology Contrast Protocol - do NOT remove file path    Answer:   [email or web address]   All questions were answered. The patient knows to call the clinic with any problems, questions or concerns.      Cammie Sickle, MD 02/03/2020 6:43 AM

## 2020-02-02 NOTE — Assessment & Plan Note (Addendum)
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 Currently on venetoclax-[March 2020.]. STABLE.  #Currently on Venclexta 300 mg a day; today white count is 3.4 ANC 1.7 platelets -71; hemoglobin 13 continue venetoclax to 300 mg once a day.  For now continue current therapy. We will plan CT scan chest and pelvis prior to next visit.  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     # A. Fib-stable continue Xarelto [Dr.Kowalski].STABLE; on renally adjusted Xarelto 15 mg/day.  # DISPOSITION: # follow up in 3 months-MD: labs- cbc/cmp/ldh;CT C/A/P prior- Dr.B

## 2020-02-10 MED FILL — VENCLEXTA 100 MG TABS: 100 | 30 days supply | Qty: 90 | Fill #1

## 2020-03-07 ENCOUNTER — Other Ambulatory Visit: Payer: Self-pay | Admitting: Family Medicine

## 2020-03-07 DIAGNOSIS — Z1231 Encounter for screening mammogram for malignant neoplasm of breast: Secondary | ICD-10-CM

## 2020-03-09 MED FILL — VENCLEXTA 100 MG TABS: 100 | 30 days supply | Qty: 90 | Fill #2

## 2020-03-24 ENCOUNTER — Other Ambulatory Visit: Payer: Self-pay

## 2020-03-24 ENCOUNTER — Ambulatory Visit
Admission: RE | Admit: 2020-03-24 | Discharge: 2020-03-24 | Disposition: A | Discharge status: Home / Self Care | Payer: Medicare Other | Source: Ambulatory Visit | Attending: Family Medicine | Admitting: Family Medicine

## 2020-03-24 DIAGNOSIS — Z1231 Encounter for screening mammogram for malignant neoplasm of breast: Secondary | ICD-10-CM | POA: Diagnosis not present

## 2020-04-05 ENCOUNTER — Other Ambulatory Visit: Payer: Self-pay | Admitting: Internal Medicine

## 2020-04-05 DIAGNOSIS — C8308 Small cell B-cell lymphoma, lymph nodes of multiple sites: Secondary | ICD-10-CM

## 2020-04-11 MED FILL — VENCLEXTA 100 MG TABS: 100 | 30 days supply | Qty: 90 | Fill #0

## 2020-04-22 ENCOUNTER — Other Ambulatory Visit (HOSPITAL_COMMUNITY): Payer: Self-pay

## 2020-05-02 ENCOUNTER — Ambulatory Visit
Admission: RE | Admit: 2020-05-02 | Discharge: 2020-05-02 | Disposition: A | Discharge status: Home / Self Care | Payer: Medicare Other | Source: Ambulatory Visit | Attending: Internal Medicine | Admitting: Internal Medicine

## 2020-05-02 ENCOUNTER — Other Ambulatory Visit: Payer: Self-pay

## 2020-05-02 DIAGNOSIS — C8308 Small cell B-cell lymphoma, lymph nodes of multiple sites: Secondary | ICD-10-CM | POA: Insufficient documentation

## 2020-05-02 LAB — POCT I-STAT CREATININE: Creatinine, Ser: 1 mg/dL (ref 0.44–1.00)

## 2020-05-02 MED ORDER — IOHEXOL 300 MG/ML  SOLN
85.0000 mL | Freq: Once | INTRAMUSCULAR | Status: AC | PRN
  Administered 2020-05-02: 85 mL via INTRAVENOUS

## 2020-05-03 ENCOUNTER — Other Ambulatory Visit (HOSPITAL_COMMUNITY): Payer: Self-pay

## 2020-05-04 ENCOUNTER — Inpatient Hospital Stay (HOSPITAL_BASED_OUTPATIENT_CLINIC_OR_DEPARTMENT_OTHER): Payer: Medicare Other | Admitting: Internal Medicine

## 2020-05-04 ENCOUNTER — Other Ambulatory Visit: Payer: Self-pay

## 2020-05-04 ENCOUNTER — Encounter: Payer: Self-pay | Admitting: Family Medicine

## 2020-05-04 ENCOUNTER — Inpatient Hospital Stay: Payer: Medicare Other | Attending: Internal Medicine

## 2020-05-04 ENCOUNTER — Ambulatory Visit (INDEPENDENT_AMBULATORY_CARE_PROVIDER_SITE_OTHER): Payer: Medicare Other | Admitting: Family Medicine

## 2020-05-04 VITALS — BP 108/69 | HR 75 | Temp 98.3°F | Resp 16 | Wt 139.0 lb

## 2020-05-04 DIAGNOSIS — R5383 Other fatigue: Secondary | ICD-10-CM | POA: Insufficient documentation

## 2020-05-04 DIAGNOSIS — Z7901 Long term (current) use of anticoagulants: Secondary | ICD-10-CM | POA: Insufficient documentation

## 2020-05-04 DIAGNOSIS — E039 Hypothyroidism, unspecified: Secondary | ICD-10-CM

## 2020-05-04 DIAGNOSIS — Z8249 Family history of ischemic heart disease and other diseases of the circulatory system: Secondary | ICD-10-CM | POA: Insufficient documentation

## 2020-05-04 DIAGNOSIS — Z836 Family history of other diseases of the respiratory system: Secondary | ICD-10-CM | POA: Insufficient documentation

## 2020-05-04 DIAGNOSIS — Z801 Family history of malignant neoplasm of trachea, bronchus and lung: Secondary | ICD-10-CM | POA: Diagnosis not present

## 2020-05-04 DIAGNOSIS — Z79899 Other long term (current) drug therapy: Secondary | ICD-10-CM | POA: Insufficient documentation

## 2020-05-04 DIAGNOSIS — M7989 Other specified soft tissue disorders: Secondary | ICD-10-CM | POA: Diagnosis not present

## 2020-05-04 DIAGNOSIS — Z9049 Acquired absence of other specified parts of digestive tract: Secondary | ICD-10-CM | POA: Diagnosis not present

## 2020-05-04 DIAGNOSIS — Z803 Family history of malignant neoplasm of breast: Secondary | ICD-10-CM | POA: Insufficient documentation

## 2020-05-04 DIAGNOSIS — Z8261 Family history of arthritis: Secondary | ICD-10-CM | POA: Insufficient documentation

## 2020-05-04 DIAGNOSIS — Z86010 Personal history of colonic polyps: Secondary | ICD-10-CM | POA: Insufficient documentation

## 2020-05-04 DIAGNOSIS — Z8719 Personal history of other diseases of the digestive system: Secondary | ICD-10-CM | POA: Diagnosis not present

## 2020-05-04 DIAGNOSIS — E7800 Pure hypercholesterolemia, unspecified: Secondary | ICD-10-CM | POA: Insufficient documentation

## 2020-05-04 DIAGNOSIS — I4891 Unspecified atrial fibrillation: Secondary | ICD-10-CM | POA: Diagnosis not present

## 2020-05-04 DIAGNOSIS — C8308 Small cell B-cell lymphoma, lymph nodes of multiple sites: Secondary | ICD-10-CM | POA: Diagnosis present

## 2020-05-04 DIAGNOSIS — I129 Hypertensive chronic kidney disease with stage 1 through stage 4 chronic kidney disease, or unspecified chronic kidney disease: Secondary | ICD-10-CM | POA: Diagnosis not present

## 2020-05-04 DIAGNOSIS — N182 Chronic kidney disease, stage 2 (mild): Secondary | ICD-10-CM | POA: Diagnosis not present

## 2020-05-04 DIAGNOSIS — C9190 Lymphoid leukemia, unspecified not having achieved remission: Secondary | ICD-10-CM | POA: Insufficient documentation

## 2020-05-04 DIAGNOSIS — C434 Malignant melanoma of scalp and neck: Secondary | ICD-10-CM | POA: Insufficient documentation

## 2020-05-04 DIAGNOSIS — D696 Thrombocytopenia, unspecified: Secondary | ICD-10-CM | POA: Diagnosis not present

## 2020-05-04 DIAGNOSIS — I4819 Other persistent atrial fibrillation: Secondary | ICD-10-CM | POA: Diagnosis not present

## 2020-05-04 DIAGNOSIS — R531 Weakness: Secondary | ICD-10-CM | POA: Insufficient documentation

## 2020-05-04 DIAGNOSIS — Z9221 Personal history of antineoplastic chemotherapy: Secondary | ICD-10-CM | POA: Diagnosis not present

## 2020-05-04 LAB — CBC WITH DIFFERENTIAL/PLATELET
Abs Immature Granulocytes: 0.01 10*3/uL (ref 0.00–0.07)
Basophils Absolute: 0 10*3/uL (ref 0.0–0.1)
Basophils Relative: 0 %
Eosinophils Absolute: 0 10*3/uL (ref 0.0–0.5)
Eosinophils Relative: 0 %
HCT: 38 % (ref 36.0–46.0)
Hemoglobin: 13.2 g/dL (ref 12.0–15.0)
Immature Granulocytes: 0 %
Lymphocytes Relative: 24 %
Lymphs Abs: 0.8 10*3/uL (ref 0.7–4.0)
MCH: 32.8 pg (ref 26.0–34.0)
MCHC: 34.7 g/dL (ref 30.0–36.0)
MCV: 94.5 fL (ref 80.0–100.0)
Monocytes Absolute: 0.5 10*3/uL (ref 0.1–1.0)
Monocytes Relative: 14 %
Neutro Abs: 2 10*3/uL (ref 1.7–7.7)
Neutrophils Relative %: 62 %
Platelets: 79 10*3/uL — ABNORMAL LOW (ref 150–400)
RBC: 4.02 MIL/uL (ref 3.87–5.11)
RDW: 13.7 % (ref 11.5–15.5)
WBC: 3.3 10*3/uL — ABNORMAL LOW (ref 4.0–10.5)
nRBC: 0 % (ref 0.0–0.2)

## 2020-05-04 LAB — COMPREHENSIVE METABOLIC PANEL
ALT: 27 U/L (ref 0–44)
AST: 38 U/L (ref 15–41)
Albumin: 4.3 g/dL (ref 3.5–5.0)
Alkaline Phosphatase: 54 U/L (ref 38–126)
Anion gap: 11 (ref 5–15)
BUN: 16 mg/dL (ref 8–23)
CO2: 26 mmol/L (ref 22–32)
Calcium: 9 mg/dL (ref 8.9–10.3)
Chloride: 101 mmol/L (ref 98–111)
Creatinine, Ser: 0.98 mg/dL (ref 0.44–1.00)
GFR, Estimated: 58 mL/min — ABNORMAL LOW (ref >60)
Glucose, Bld: 119 mg/dL — ABNORMAL HIGH (ref 70–99)
Potassium: 3.6 mmol/L (ref 3.5–5.1)
Sodium: 138 mmol/L (ref 135–145)
Total Bilirubin: 1.2 mg/dL (ref 0.3–1.2)
Total Protein: 6.7 g/dL (ref 6.5–8.1)

## 2020-05-04 LAB — LACTATE DEHYDROGENASE: LDH: 183 U/L (ref 98–192)

## 2020-05-04 NOTE — Progress Notes (Signed)
I,Jacqueline Webb,acting as a scribe for Jacqueline Durie, MD.,have documented all relevant documentation on the behalf of Jacqueline Durie, MD,as directed by  Jacqueline Durie, MD while in the presence of Jacqueline Durie, MD.   Established patient visit   Patient: Jacqueline Webb   DOB: 04-06-1940   80 y.o. Female  MRN: [id number] Visit Date: 05/04/2020  Today's healthcare provider: [name], MD   Chief Complaint  Patient presents with  . Follow-up  . Hyperlipidemia  . Hypothyroidism   Subjective    HPI  Patient is doing well.  Just had appointment with oncology and everything is stable.  She feels well and is getting ready to start her golf season again.  She lives in the [place] with her husband. Lipid/Cholesterol, follow-up  Last Lipid Panel: Lab Results  Component Value Date   CHOL 165 11/04/2019   LDLCALC 68 11/04/2019   HDL 87 11/04/2019   TRIG 50 11/04/2019    She was last seen for this 6 months ago.  Management since that visit includes; labs checked showing-good. She reports good compliance with treatment. She is not having side effects. none She is following a Regular diet. Current exercise: walking  Last metabolic panel Lab Results  Component Value Date   GLUCOSE 119 (H) 05/04/2020   NA 138 05/04/2020   K 3.6 05/04/2020   BUN 16 05/04/2020   CREATININE 0.98 05/04/2020   GFRNONAA 58 (L) 05/04/2020   GFRAA >60 08/05/2019   CALCIUM 9.0 05/04/2020   AST 38 05/04/2020   ALT 27 05/04/2020   The ASCVD Risk score (Goff DC Jr., et al., 2013) failed to calculate for the following reasons:   The 2013 ASCVD risk score is only valid for ages 23 to 19  --------------------------------------------------------------------------------------------------- Hypothyroid, follow-up  Lab Results  Component Value Date   TSH 0.765 11/04/2019   TSH 0.608 05/06/2019   TSH 1.770 07/03/2017   Wt Readings from Last 3 Encounters:  05/04/20 139  lb (63 kg)  05/04/20 138 lb 6.4 oz (62.8 kg)  02/02/20 138 lb (62.6 kg)    She was last seen for hypothyroid 6 months ago.  Management since that visit includes; labs checked showing-good. She reports good compliance with treatment. She is not having side effects. 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      Medications: Outpatient Medications Prior to Visit  Medication Sig  . Docusate Calcium (STOOL SOFTENER PO) Take by mouth as needed.   . ezetimibe (ZETIA) 10 MG tablet Take 1 tablet (10 mg total) daily by mouth.  . isosorbide mononitrate (IMDUR) 30 MG 24 hr tablet Take 30 mg by mouth daily.   [name] levothyroxine (SYNTHROID) 88 MCG tablet TAKE 1 TABLET BY MOUTH  DAILY BEFORE BREAKFAST (Patient taking differently: TAKE 1 TABLET BY MOUTH  DAILY BEFORE BREAKFAST)  . lisinopril-hydrochlorothiazide (ZESTORETIC) 20-12.5 MG tablet TAKE 1 TABLET BY MOUTH  DAILY  . melatonin 5 MG TABS Take 5 mg by mouth.  . metoprolol succinate (TOPROL-XL) 25 MG 24 hr tablet Take 25 mg by mouth daily.   . ondansetron (ZOFRAN) 4 MG tablet Take 1 tablet (4 mg total) by mouth every 8 (eight) hours as needed for nausea or vomiting.  [name] oxybutynin (DITROPAN) 5 MG tablet Take 1 tablet (5 mg total) by mouth 2 (two) times daily.  . rosuvastatin (CRESTOR) 5 MG tablet Take 5 mg by  mouth daily. Takes 3 times a week  . venetoclax (VENCLEXTA) 100 MG tablet TAKE 3 TABLETS BY MOUTH DAILY  . XARELTO 15 MG TABS tablet Take 15 mg by mouth daily.   Facility-Administered Medications Prior to Visit  Medication Dose Route Frequency Provider  . heparin lock flush 100 unit/mL  500 Units Intravenous Once Charlaine Dalton R, MD  . sodium chloride flush (NS) 0.9 % injection 10 mL  10 mL Intravenous PRN Cammie Sickle, MD    Review of Systems  Constitutional: Negative for appetite change, chills,  fatigue and fever.  Respiratory: Negative for chest tightness and shortness of breath.   Cardiovascular: Negative for chest pain and palpitations.  Gastrointestinal: Negative for abdominal pain, nausea and vomiting.  Neurological: Negative for dizziness and weakness.        Objective    BP 108/69 (BP Location: Left Arm, Patient Position: Sitting, Cuff Size: Normal)   Pulse 75   Temp 98.3 F (36.8 C) (Oral)   Resp 16   Wt 139 lb (63 kg)   SpO2 97%   BMI 23.86 kg/m  BP Readings from Last 3 Encounters:  05/04/20 108/69  05/04/20 107/68  02/02/20 138/82   Wt Readings from Last 3 Encounters:  05/04/20 139 lb (63 kg)  05/04/20 138 lb 6.4 oz (62.8 kg)  02/02/20 138 lb (62.6 kg)       Physical Exam    Results for orders placed or performed in visit on 05/04/20  Lactate dehydrogenase  Result Value Ref Range   LDH 183 98 - 192 U/L  Comprehensive metabolic panel  Result Value Ref Range   Sodium 138 135 - 145 mmol/L   Potassium 3.6 3.5 - 5.1 mmol/L   Chloride 101 98 - 111 mmol/L   CO2 26 22 - 32 mmol/L   Glucose, Bld 119 (H) 70 - 99 mg/dL   BUN 16 8 - 23 mg/dL   Creatinine, Ser 0.98 0.44 - 1.00 mg/dL   Calcium 9.0 8.9 - 10.3 mg/dL   Total Protein 6.7 6.5 - 8.1 g/dL   Albumin 4.3 3.5 - 5.0 g/dL   AST 38 15 - 41 U/L   ALT 27 0 - 44 U/L   Alkaline Phosphatase 54 38 - 126 U/L   Total Bilirubin 1.2 0.3 - 1.2 mg/dL   GFR, Estimated 58 (L) >60 mL/min   Anion gap 11 5 - 15  CBC with Differential/Platelet  Result Value Ref Range   WBC 3.3 (L) 4.0 - 10.5 K/uL   RBC 4.02 3.87 - 5.11 MIL/uL   Hemoglobin 13.2 12.0 - 15.0 g/dL   HCT 38.0 36.0 - 46.0 %   MCV 94.5 80.0 - 100.0 fL   MCH 32.8 26.0 - 34.0 pg   MCHC 34.7 30.0 - 36.0 g/dL   RDW 13.7 11.5 - 15.5 %   Platelets 79 (L) 150 - 400 K/uL   nRBC 0.0 0.0 - 0.2 %   Neutrophils Relative % 62 %   Neutro Abs 2.0 1.7 - 7.7 K/uL   Lymphocytes Relative 24 %   Lymphs Abs 0.8 0.7 - 4.0 K/uL   Monocytes Relative 14 %   Monocytes  Absolute 0.5 0.1 - 1.0 K/uL   Eosinophils Relative 0 %   Eosinophils Absolute 0.0 0.0 - 0.5 K/uL   Basophils Relative 0 %   Basophils Absolute 0.0 0.0 - 0.1 K/uL   Immature Granulocytes 0 %   Abs Immature Granulocytes 0.01 0.00 - 0.07 K/uL  Assessment & Plan     1. Hypercholesteremia On rosuvastatin Follow-up 6 months. 2. Adult hypothyroidism Follow TSH  3. Persistent atrial fibrillation (HCC) 3 Xarelto back to 20  4. 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      I, Jacqueline Durie, MD, have reviewed all documentation for this visit. The documentation on 05/14/20 for the exam, diagnosis, procedures, and orders are all accurate and complete.   Richard Cranford Mon, MD  [facility] [phone] (phone) [phone] (fax)  [place]

## 2020-05-04 NOTE — Assessment & Plan Note (Addendum)
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 April 2022-no findings suggestive of active lymphoma; see below regarding incidental findings  #Continue venetoclax at current dose.  Tolerating well without any major side effects-except platelets in 70s.   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     # A. Fib-stable continue Xarelto [Dr.Kowalski].STABLE; on renally adjusted Xarelto 15-20 mg/day [per cards].  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   # DISPOSITION: # follow up in 3 months-MD: labs- cbc/cmp/ldh;- Dr.B  # I reviewed the blood work- with the patient in detail; also reviewed the imaging independently [as summarized above]; and with the patient in detail.

## 2020-05-04 NOTE — Progress Notes (Signed)
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 Lymphoma low-grade, status Rituxan therapy. 2. Recurrent disease by clinical examination in December of 2012 3. Ovarian mass on PET scan in December of 2012 (normal CA 125). Ovarian mass has resolved after rituximab therapy. 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  Patient had total 5 cycles of chemotherapy with bendamustine and rituximab and 6 cycle was omitted because of significant side effects with and weakness and fatigue.  # OCT 2016- PET significant response; ON surveillance  # 18th OCT 2018- [place];SEP 2018-/OCT 2018 CT scan- progression; s/p 6 cycles [#6 in April 2019]  # JAN 2020-CT scan shows progressive disease; start a acalabrutinib 100mg  BID;# feb 6th2020- decrease dose of acalbrutinib to 100 mg/day [sec to spon bruising] March 1st week- STOPPED [place] sec to spon brusing  # MARCH 17th 2020- Start Venetoclax [out pt ramp up]; 300 mg a day   # 11/06/2016- colonoscopy [Dr.Byrnett- 2 polyps]  #History of A.  Fib-Xarelto; CKD stage II-III.   # #Right posterior neck melanoma stage I [ Dr.Lee/Elkins]..   -------------------------------------------------------------------    # left ? Adnexal mass- ? Etiology; mild SUV uptake incidental [stable compared to previous PET in 2016] Previous workup 2015- vaginal ultrasound negative. MRI February 2018- approximately 3 cm in size; slow increase in the size of the left adnexal mass over at least 3 years- suspect involvement of lymphoma rather than a primary gynecologic malignancy. --------------------------------------------------------------   DIAGNOSIS: SMALL LYMPHOCYTIC LEUKEMIA  STAGE:  IV    ;GOALS: control/pallaitive  CURRENT/MOST RECENT THERAPY: Venatoclax.    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  No bruising.  Denies any recent admission to hospital.  No new lumps or bumps.  She continues to be on Xarelto.   Review of Systems  Constitutional: Positive for malaise/fatigue. Negative for chills, diaphoresis, fever and weight loss.  HENT: Negative for nosebleeds and sore throat.   Eyes: Negative for double vision.  Respiratory: Negative for cough, hemoptysis, sputum production, shortness of breath and wheezing.   Cardiovascular: Positive for leg swelling. Negative for chest pain, palpitations and orthopnea.  Gastrointestinal: Negative for abdominal pain, blood in stool, constipation, diarrhea, heartburn, melena and vomiting.  Genitourinary: Negative for dysuria, frequency and urgency.  Musculoskeletal: Negative for back pain and  joint pain.   Neurological: Negative for dizziness, tingling, focal weakness, weakness and headaches.  Psychiatric/Behavioral: Negative for depression. The patient is not nervous/anxious and does not have insomnia.     PAST MEDICAL HISTORY :  Past Medical History:  Diagnosis Date  . A-fib ([place])   . Benign neoplasm of rectum and anal canal   . 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 Dysrhythmia    A-Fib  . Hypercholesteremia   . Hypertension   . Hyperthyroidism   . Lymphoma ([place]) 2007  . Non Hodgkin's lymphoma ([place]) 01/22/2006  . Personal history of chemotherapy     PAST SURGICAL HISTORY :   Past Surgical History:  Procedure Laterality Date  . ABDOMINAL HYSTERECTOMY  1958  . APPENDECTOMY    . BREAST BIOPSY Right 1970   right breast biopsy with clip- neg  . BREAST BIOPSY Left 2002   left breast biopsy with clip-neg  . BREAST BIOPSY Right 2014   right breast biopsy with clip-neg  . BREAST CYST ASPIRATION Bilateral 2000   bilateral fine needle aspiration  . BREAST SURGERY Left 1990   biopsy  . BREAST SURGERY Right May 08, 2012   complex fibroadenoma without malignancy.  [name] CARDIAC CATHETERIZATION  2014  . COLONOSCOPY  2009, 2012    hyperplastic rectal polyp 2012 tubular adenoma of proximal ascending colon 2000 9 repeat exam due to 2017.  [name] COLONOSCOPY WITH PROPOFOL N/A 11/06/2016   Procedure: COLONOSCOPY WITH PROPOFOL;  Surgeon: Robert Bellow, MD;  Location: [facility] ENDOSCOPY;  Service: Endoscopy;  Laterality: N/A;  . COLONOSCOPY WITH PROPOFOL N/A 02/27/2019   Procedure: COLONOSCOPY WITH PROPOFOL;  Surgeon: Robert Bellow, MD;  Location: [facility] ENDOSCOPY;  Service: General;  Laterality: N/A;  . CORONARY ARTERY BYPASS GRAFT  1999  . FLEXIBLE SIGMOIDOSCOPY  1998    FAMILY HISTORY :   Family History  Problem Relation Age of Onset  . Lung cancer Father        Died age 51  . Heart disease Mother        Died age 86  . Asthma  Mother   . CAD Brother        double bypass  . Heart attack Sister   . Tuberculosis Sister        29 years old  . Lung cancer Sister        Died in her 25s  . Angina Sister   . Breast cancer Sister 110  . Rheum arthritis Sister   . Aneurysm Sister        Brain, died age 72  . Angina Sister   . Heart disease Brother        Stent placement  . Heart disease Brother        stent placement  . Cancer Other        breast cancer niece, pancreatic cancer niece  . Breast cancer Other   . Ovarian cancer Neg Hx   . Diabetes Neg Hx     SOCIAL HISTORY:   Social History   Tobacco Use  . Smoking status: Never Smoker  . Smokeless tobacco: Never Used  Vaping Use  . Vaping Use: Never used  Substance Use Topics  . Alcohol use: Yes    Alcohol/week: 0.0 - 7.0 standard drinks    Comment: 1 drink a day of wine or cocktail  . Drug use: No    ALLERGIES:  is allergic to pravastatin.  MEDICATIONS:  Current Outpatient Medications  Medication Sig Dispense Refill  . Docusate Calcium (STOOL SOFTENER PO) Take by mouth as needed.     . ezetimibe (ZETIA) 10 MG tablet Take 1 tablet (10 mg total) daily by mouth. 30 tablet 12  . isosorbide mononitrate (IMDUR) 30 MG 24 hr tablet Take 30 mg by mouth daily.     [name] levothyroxine (SYNTHROID) 88 MCG tablet TAKE 1 TABLET BY MOUTH  DAILY BEFORE BREAKFAST (Patient taking differently: TAKE 1 TABLET BY MOUTH  DAILY BEFORE BREAKFAST) 90 tablet 3  . lisinopril-hydrochlorothiazide (ZESTORETIC) 20-12.5 MG tablet TAKE 1 TABLET BY MOUTH  DAILY 90 tablet 1  . melatonin 5 MG TABS Take 5 mg by mouth.    . metoprolol succinate (TOPROL-XL) 25 MG 24 hr tablet Take 25 mg by mouth daily.     [name] oxybutynin (DITROPAN) 5 MG tablet Take 1 tablet (5 mg total) by mouth 2 (two) times daily. 180 tablet 3  . rosuvastatin (CRESTOR) 5 MG tablet Take 5 mg by mouth daily. Takes 3 times a week    . venetoclax (VENCLEXTA) 100 MG tablet TAKE 3 TABLETS BY MOUTH DAILY 90 tablet 2  . XARELTO 15  MG TABS tablet Take 15 mg by mouth daily.    . ondansetron (ZOFRAN) 4 MG tablet Take 1 tablet (4 mg total) by mouth every 8 (eight) hours as needed for nausea or vomiting. (Patient not taking: No sig reported) 40 tablet 3   No current facility-administered medications for this visit.   Facility-Administered Medications Ordered in Other Visits  Medication Dose Route Frequency Provider Last Rate Last Admin  . heparin lock flush 100 unit/mL  500 Units Intravenous Once Charlaine Dalton R, MD      . sodium chloride flush (NS) 0.9 % injection 10 mL  10 mL Intravenous PRN Cammie Sickle, MD        PHYSICAL EXAMINATION: ECOG PERFORMANCE STATUS: 1 - Symptomatic but completely ambulatory  BP 107/68 (BP Location: Left Arm, Patient Position: Sitting)   Pulse 75   Temp (!) 96.5 F (35.8 C) (Tympanic)   Resp 16   Wt 138 lb 6.4 oz (62.8 kg)   SpO2 100%   BMI 23.76 kg/m   Filed Weights   05/04/20 1036  Weight: 138 lb 6.4 oz (62.8 kg)    Physical Exam Constitutional:      Comments: Walking independently.  Accompanied by her husband.  HENT:     Head: Normocephalic and atraumatic.     Mouth/Throat:     Pharynx: No oropharyngeal exudate.  Eyes:     Pupils: Pupils are equal, round, and reactive to light.  Cardiovascular:     Rate and Rhythm: Normal rate and regular rhythm.  Pulmonary:     Effort: No respiratory distress.     Breath sounds: No wheezing.  Abdominal:     General: Bowel sounds are normal. There is no distension.     Palpations: Abdomen is soft. There is no mass.     Tenderness: There is no abdominal tenderness. There is no guarding or rebound.  Musculoskeletal:        General: No tenderness. Normal range of motion.     Cervical back: Normal range of motion and neck supple.  Skin:    General: Skin is warm.  Neurological:     Mental Status: She is alert and oriented to person, place, and time.  Psychiatric:        Mood and Affect: Affect normal.  LABORATORY DATA:  I have reviewed the data as listed    Component Value Date/Time   NA 138 05/04/2020 0954   NA 139 07/03/2017 1425   NA 141 05/11/2014 0853   K 3.6 05/04/2020 0954   K 3.6 05/11/2014 0853   CL 101 05/04/2020 0954   CL 103 05/11/2014 0853   CO2 26 05/04/2020 0954   CO2 29 05/11/2014 0853   GLUCOSE 119 (H) 05/04/2020 0954   GLUCOSE 107 (H) 05/11/2014 0853   BUN 16 05/04/2020 0954   BUN 19 07/03/2017 1425   BUN 22 (H) 05/11/2014 0853   CREATININE 0.98 05/04/2020 0954   CREATININE 1.07 (H) 05/11/2014 0853   CALCIUM 9.0 05/04/2020 0954   CALCIUM 10.1 05/11/2014 0853   PROT 6.7 05/04/2020 0954   PROT 6.4 07/03/2017 1425   PROT 7.3 05/11/2014 0853   ALBUMIN 4.3 05/04/2020 0954   ALBUMIN 4.3 07/03/2017 1425   ALBUMIN 4.7 05/11/2014 0853   AST 38 05/04/2020 0954   AST 29 05/11/2014 0853   ALT 27 05/04/2020 0954   ALT 20 05/11/2014 0853   ALKPHOS 54 05/04/2020 0954   ALKPHOS 47 05/11/2014 0853   BILITOT 1.2 05/04/2020 0954   BILITOT 0.5 07/03/2017 1425   BILITOT 1.0 05/11/2014 0853   GFRNONAA 58 (L) 05/04/2020 0954   GFRNONAA 51 (L) 05/11/2014 0853   GFRAA >60 08/05/2019 0941   GFRAA 59 (L) 05/11/2014 0853    No results found for: SPEP, UPEP  Lab Results  Component Value Date   WBC 3.3 (L) 05/04/2020   NEUTROABS 2.0 05/04/2020   HGB 13.2 05/04/2020   HCT 38.0 05/04/2020   MCV 94.5 05/04/2020   PLT 79 (L) 05/04/2020      Chemistry      Component Value Date/Time   NA 138 05/04/2020 0954   NA 139 07/03/2017 1425   NA 141 05/11/2014 0853   K 3.6 05/04/2020 0954   K 3.6 05/11/2014 0853   CL 101 05/04/2020 0954   CL 103 05/11/2014 0853   CO2 26 05/04/2020 0954   CO2 29 05/11/2014 0853   BUN 16 05/04/2020 0954   BUN 19 07/03/2017 1425   BUN 22 (H) 05/11/2014 0853   CREATININE 0.98 05/04/2020 0954   CREATININE 1.07 (H) 05/11/2014 0853   GLU 98 05/25/2013 0000      Component Value Date/Time   CALCIUM 9.0 05/04/2020 0954   CALCIUM 10.1  05/11/2014 0853   ALKPHOS 54 05/04/2020 0954   ALKPHOS 47 05/11/2014 0853   AST 38 05/04/2020 0954   AST 29 05/11/2014 0853   ALT 27 05/04/2020 0954   ALT 20 05/11/2014 0853   BILITOT 1.2 05/04/2020 0954   BILITOT 0.5 07/03/2017 1425   BILITOT 1.0 05/11/2014 0853       RADIOGRAPHIC STUDIES: I have personally reviewed the radiological images as listed and agreed with the findings in the report. No results found.   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 April 2022-no findings suggestive of active lymphoma; see below regarding incidental findings  #Continue venetoclax at current dose.  Tolerating well without any major side effects-except platelets in 70s.   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     # A. Fib-stable continue Xarelto [Dr.Kowalski].STABLE; on renally adjusted Xarelto 15-20 mg/day [per cards].  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   # DISPOSITION: # follow up in 3 months-MD: labs- cbc/cmp/ldh;- Dr.B  # I reviewed the blood work- with the patient in detail; also reviewed the imaging independently [as summarized above]; and with the patient in detail.         No orders of the defined types were placed in this encounter.  All questions were answered. The patient knows to call the clinic with any problems, questions or concerns.      Cammie Sickle, MD 05/04/2020 1:17 PM

## 2020-05-04 NOTE — Progress Notes (Signed)
Routine follow up today. Had a recent scan done. Doing relatively well.

## 2020-05-05 ENCOUNTER — Other Ambulatory Visit (HOSPITAL_COMMUNITY): Payer: Self-pay

## 2020-05-05 ENCOUNTER — Ambulatory Visit: Payer: Medicare Other | Admitting: Family Medicine

## 2020-05-05 MED FILL — Venetoclax Tab 100 MG: ORAL | 30 days supply | Qty: 90 | Fill #0 | Status: AC

## 2020-05-11 ENCOUNTER — Other Ambulatory Visit (HOSPITAL_COMMUNITY): Payer: Self-pay

## 2020-05-19 ENCOUNTER — Other Ambulatory Visit: Payer: Self-pay | Admitting: Family Medicine

## 2020-05-19 DIAGNOSIS — N3941 Urge incontinence: Secondary | ICD-10-CM

## 2020-05-19 NOTE — Telephone Encounter (Signed)
Requested medications are due for refill today.  yes  Requested medications are on the active medications list.  yes  Last refill. 05/05/2019  Future visit scheduled.   yes  Notes to clinic.  Rx is expired.

## 2020-05-19 NOTE — Telephone Encounter (Signed)
Requested Prescriptions  Pending Prescriptions Disp Refills  . lisinopril-hydrochlorothiazide (ZESTORETIC) 20-12.5 MG tablet [Pharmacy Med Name: Lisinopril-hydroCHLOROthiazide 20-12.5 MG Oral Tablet] 90 tablet 1    Sig: TAKE 1 TABLET BY MOUTH  DAILY     Cardiovascular:  ACEI + Diuretic Combos Passed - 05/19/2020  4:40 AM      Passed - Na in normal range and within 180 days    Sodium  Date Value Ref Range Status  05/04/2020 138 135 - 145 mmol/L Final  07/03/2017 139 134 - 144 mmol/L Final  05/11/2014 141 mmol/L Final    Comment:    135-145 NOTE: New Reference Range  03/30/14          Passed - K in normal range and within 180 days    Potassium  Date Value Ref Range Status  05/04/2020 3.6 3.5 - 5.1 mmol/L Final  05/11/2014 3.6 mmol/L Final    Comment:    3.5-5.1 NOTE: New Reference Range  03/30/14          Passed - Cr in normal range and within 180 days    Creatinine  Date Value Ref Range Status  05/11/2014 1.07 (H) mg/dL Final    Comment:    0.44-1.00 NOTE: New Reference Range  03/30/14    Creatinine, Ser  Date Value Ref Range Status  05/04/2020 0.98 0.44 - 1.00 mg/dL Final         Passed - Ca in normal range and within 180 days    Calcium  Date Value Ref Range Status  05/04/2020 9.0 8.9 - 10.3 mg/dL Final   Calcium, Total  Date Value Ref Range Status  05/11/2014 10.1 mg/dL Final    Comment:    8.9-10.3 NOTE: New Reference Range  03/30/14          Passed - Patient is not pregnant      Passed - Last BP in normal range    BP Readings from Last 1 Encounters:  05/04/20 108/69         Passed - Valid encounter within last 6 months    Recent Outpatient Visits          2 weeks ago [place] [name], MD   6 months ago Adult hypothyroidism   [facility] [name], MD   1 year ago Adult hypothyroidism   [facility] [name], MD   1 year ago  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 levothyroxine (SYNTHROID) 88 MCG tablet [Pharmacy Med Name: Levothyroxine Sodium 88 MCG Oral Tablet] 90 tablet 1    Sig: TAKE 1 TABLET BY MOUTH  DAILY BEFORE BREAKFAST     Endocrinology:  Hypothyroid Agents Failed - 05/19/2020  4:40 AM      Failed - TSH needs to be rechecked within 3 months after an abnormal result. Refill until TSH is due.      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

## 2020-06-06 ENCOUNTER — Other Ambulatory Visit (HOSPITAL_COMMUNITY): Payer: Self-pay

## 2020-06-06 MED FILL — Venetoclax Tab 100 MG: ORAL | 30 days supply | Qty: 90 | Fill #1 | Status: AC

## 2020-06-09 ENCOUNTER — Other Ambulatory Visit (HOSPITAL_COMMUNITY): Payer: Self-pay

## 2020-07-06 ENCOUNTER — Other Ambulatory Visit: Payer: Self-pay | Admitting: Internal Medicine

## 2020-07-06 ENCOUNTER — Other Ambulatory Visit (HOSPITAL_COMMUNITY): Payer: Self-pay

## 2020-07-06 DIAGNOSIS — C8308 Small cell B-cell lymphoma, lymph nodes of multiple sites: Secondary | ICD-10-CM

## 2020-07-06 MED ORDER — VENETOCLAX 100 MG PO TABS
ORAL_TABLET | Freq: Every day | ORAL | 2 refills | Status: DC
  Filled 2020-07-06: qty 90, 30d supply, fill #0
  Filled 2020-08-10: qty 90, 30d supply, fill #1
  Filled 2020-09-07: qty 90, 30d supply, fill #2

## 2020-07-07 ENCOUNTER — Other Ambulatory Visit (HOSPITAL_COMMUNITY): Payer: Self-pay

## 2020-07-19 ENCOUNTER — Other Ambulatory Visit (HOSPITAL_BASED_OUTPATIENT_CLINIC_OR_DEPARTMENT_OTHER): Payer: Self-pay

## 2020-07-29 ENCOUNTER — Other Ambulatory Visit: Payer: Self-pay | Admitting: *Deleted

## 2020-07-29 DIAGNOSIS — C8308 Small cell B-cell lymphoma, lymph nodes of multiple sites: Secondary | ICD-10-CM

## 2020-08-03 ENCOUNTER — Inpatient Hospital Stay: Payer: Medicare Other | Attending: Internal Medicine

## 2020-08-03 ENCOUNTER — Inpatient Hospital Stay (HOSPITAL_BASED_OUTPATIENT_CLINIC_OR_DEPARTMENT_OTHER): Payer: Medicare Other | Admitting: Internal Medicine

## 2020-08-03 ENCOUNTER — Other Ambulatory Visit: Payer: Self-pay | Admitting: Family Medicine

## 2020-08-03 ENCOUNTER — Other Ambulatory Visit: Payer: Self-pay

## 2020-08-03 ENCOUNTER — Encounter: Payer: Self-pay | Admitting: Internal Medicine

## 2020-08-03 VITALS — BP 106/68 | HR 52 | Temp 95.2°F | Resp 20 | Ht 64.0 in | Wt 132.0 lb

## 2020-08-03 DIAGNOSIS — E039 Hypothyroidism, unspecified: Secondary | ICD-10-CM | POA: Diagnosis not present

## 2020-08-03 DIAGNOSIS — I4891 Unspecified atrial fibrillation: Secondary | ICD-10-CM | POA: Insufficient documentation

## 2020-08-03 DIAGNOSIS — Z79899 Other long term (current) drug therapy: Secondary | ICD-10-CM | POA: Diagnosis not present

## 2020-08-03 DIAGNOSIS — Z9221 Personal history of antineoplastic chemotherapy: Secondary | ICD-10-CM | POA: Diagnosis not present

## 2020-08-03 DIAGNOSIS — D696 Thrombocytopenia, unspecified: Secondary | ICD-10-CM | POA: Diagnosis not present

## 2020-08-03 DIAGNOSIS — I10 Essential (primary) hypertension: Secondary | ICD-10-CM | POA: Diagnosis not present

## 2020-08-03 DIAGNOSIS — C8308 Small cell B-cell lymphoma, lymph nodes of multiple sites: Secondary | ICD-10-CM | POA: Diagnosis present

## 2020-08-03 DIAGNOSIS — E7800 Pure hypercholesterolemia, unspecified: Secondary | ICD-10-CM | POA: Insufficient documentation

## 2020-08-03 DIAGNOSIS — Z7901 Long term (current) use of anticoagulants: Secondary | ICD-10-CM | POA: Insufficient documentation

## 2020-08-03 DIAGNOSIS — Z87440 Personal history of urinary (tract) infections: Secondary | ICD-10-CM

## 2020-08-03 LAB — COMPREHENSIVE METABOLIC PANEL
ALT: 27 U/L (ref 0–44)
AST: 35 U/L (ref 15–41)
Albumin: 4.3 g/dL (ref 3.5–5.0)
Alkaline Phosphatase: 65 U/L (ref 38–126)
Anion gap: 10 (ref 5–15)
BUN: 15 mg/dL (ref 8–23)
CO2: 28 mmol/L (ref 22–32)
Calcium: 9.5 mg/dL (ref 8.9–10.3)
Chloride: 99 mmol/L (ref 98–111)
Creatinine, Ser: 0.94 mg/dL (ref 0.44–1.00)
GFR, Estimated: >60 mL/min (ref >60)
Glucose, Bld: 107 mg/dL — ABNORMAL HIGH (ref 70–99)
Potassium: 3.5 mmol/L (ref 3.5–5.1)
Sodium: 137 mmol/L (ref 135–145)
Total Bilirubin: 1.4 mg/dL — ABNORMAL HIGH (ref 0.3–1.2)
Total Protein: 6.8 g/dL (ref 6.5–8.1)

## 2020-08-03 LAB — CBC WITH DIFFERENTIAL/PLATELET
Abs Immature Granulocytes: 0.01 10*3/uL (ref 0.00–0.07)
Basophils Absolute: 0 10*3/uL (ref 0.0–0.1)
Basophils Relative: 1 %
Eosinophils Absolute: 0 10*3/uL (ref 0.0–0.5)
Eosinophils Relative: 0 %
HCT: 37.3 % (ref 36.0–46.0)
Hemoglobin: 13.1 g/dL (ref 12.0–15.0)
Immature Granulocytes: 0 %
Lymphocytes Relative: 27 %
Lymphs Abs: 0.9 10*3/uL (ref 0.7–4.0)
MCH: 33.4 pg (ref 26.0–34.0)
MCHC: 35.1 g/dL (ref 30.0–36.0)
MCV: 95.2 fL (ref 80.0–100.0)
Monocytes Absolute: 0.4 10*3/uL (ref 0.1–1.0)
Monocytes Relative: 11 %
Neutro Abs: 2 10*3/uL (ref 1.7–7.7)
Neutrophils Relative %: 61 %
Platelets: 71 10*3/uL — ABNORMAL LOW (ref 150–400)
RBC: 3.92 MIL/uL (ref 3.87–5.11)
RDW: 13.6 % (ref 11.5–15.5)
WBC: 3.2 10*3/uL — ABNORMAL LOW (ref 4.0–10.5)
nRBC: 0 % (ref 0.0–0.2)

## 2020-08-03 LAB — LACTATE DEHYDROGENASE: LDH: 158 U/L (ref 98–192)

## 2020-08-03 LAB — TSH: TSH: 0.19 u[IU]/mL — ABNORMAL LOW (ref 0.350–4.500)

## 2020-08-03 MED ORDER — NITROFURANTOIN MONOHYD MACRO 100 MG PO CAPS: 100.0000 mg | ORAL_CAPSULE | Freq: Two times a day (BID) | ORAL | 5 refills | Status: DC

## 2020-08-03 NOTE — Assessment & Plan Note (Addendum)
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 April 2022-no findings suggestive of active lymphoma; see below regarding incidental findings; STABLE  #Continue venetoclax at current dose.  Tolerating well without any major side effects-except platelets in 70s.  Hold off any imaging at this time unless any clinical symptoms of progression noted.  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 Fib-stable continue Xarelto [Dr.Kowalski].STABLE; on renally adjusted Xarelto  20 mg/day [per cards].  # Weight loss/poor taste- [7 pounds in 3 months-]; check TSH.   # DISPOSITION: add to TSH - mychart # follow up in 3 months-MD: labs- cbc/cmp/ldh;- Dr.B  Addendum TSH-low at 0.19; patient will need thyroid profile; and adjustment of her Synthroid.  Will inform patient/Dr. [name].

## 2020-08-04 ENCOUNTER — Other Ambulatory Visit (HOSPITAL_COMMUNITY): Payer: Self-pay

## 2020-08-04 ENCOUNTER — Encounter: Payer: Self-pay | Admitting: Internal Medicine

## 2020-08-04 NOTE — Progress Notes (Signed)
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 Lymphoma low-grade, status Rituxan therapy. 2. Recurrent disease by clinical examination in December of 2012 3. Ovarian mass on PET scan in December of 2012 (normal CA 125). Ovarian mass has resolved after rituximab therapy. 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  Patient had total 5 cycles of chemotherapy with bendamustine and rituximab and 6 cycle was omitted because of significant side effects with and weakness and fatigue.  # OCT 2016- PET significant response; ON surveillance  # 18th OCT 2018- [place];SEP 2018-/OCT 2018 CT scan- progression; s/p 6 cycles [#6 in April 2019]  # JAN 2020-CT scan shows progressive disease; start a acalabrutinib 100mg  BID;# feb 6th2020- decrease dose of acalbrutinib to 100 mg/day [sec to spon bruising] March 1st week- STOPPED [place] sec to spon brusing  # MARCH 17th 2020- Start Venetoclax [out pt ramp up]; 300 mg a day   # 11/06/2016- colonoscopy [Dr.Byrnett- 2 polyps]  #History of A.  Fib-Xarelto; CKD stage II-III.   # #Right posterior neck melanoma stage I [ Dr.Lee/Elkins]..   -------------------------------------------------------------------    # left ? Adnexal mass- ? Etiology; mild SUV uptake incidental [stable compared to previous PET in 2016] Previous workup 2015- vaginal ultrasound negative. MRI February 2018- approximately 3 cm in size; slow increase in the size of the left adnexal mass over at least 3 years- suspect involvement of lymphoma rather than a primary gynecologic malignancy. --------------------------------------------------------------   DIAGNOSIS: SMALL LYMPHOCYTIC LEUKEMIA  STAGE:  IV    ;GOALS: control/pallaitive  CURRENT/MOST RECENT THERAPY: Venatoclax.    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  Patient denies any new lumps or bumps.  Appetite is good.  No weight loss.  No nausea no vomiting.  She continues to be on Xarelto.  No bleeding.  Patient however lost weight.  States she had a poor appetite.  No diarrhea no heart palpitations.   Review of Systems  Constitutional:  Positive for malaise/fatigue and weight loss. Negative for chills, diaphoresis and fever.  HENT:  Negative for nosebleeds and sore throat.   Eyes:  Negative for double vision.  Respiratory:  Negative for cough, hemoptysis, sputum production, shortness of breath and wheezing.   Cardiovascular:  Positive for leg swelling. Negative for chest pain, palpitations and orthopnea.  Gastrointestinal:  Negative for  abdominal pain, blood in stool, constipation, diarrhea, heartburn, melena and vomiting.  Genitourinary:   Negative for dysuria, frequency and urgency.  Musculoskeletal:  Negative for back pain and joint pain.  Neurological:  Negative for dizziness, tingling, focal weakness, weakness and headaches.  Psychiatric/Behavioral:  Negative for depression. The patient is not nervous/anxious and does not have insomnia.    PAST MEDICAL HISTORY :  Past Medical History:  Diagnosis Date   A-fib [facility])    Benign neoplasm of rectum and anal canal    Chronic leukemia of unspecified cell type, without mention of having achieved remission    Dysrhythmia    A-Fib   Hypercholesteremia    Hypertension    Hyperthyroidism    Lymphoma ([place]) 2007   Non Hodgkin's lymphoma ([place]) 01/22/2006   Personal history of chemotherapy     PAST SURGICAL HISTORY :   Past Surgical History:  Procedure Laterality Date   ABDOMINAL HYSTERECTOMY  1958   APPENDECTOMY     BREAST BIOPSY Right 1970   right breast biopsy with clip- neg   BREAST BIOPSY Left 2002   left breast biopsy with clip-neg   BREAST BIOPSY Right 2014   right breast biopsy with clip-neg   BREAST CYST ASPIRATION Bilateral 2000   bilateral fine needle aspiration   BREAST SURGERY Left 1990   biopsy   BREAST SURGERY Right May 08, 2012   complex fibroadenoma without malignancy.   CARDIAC CATHETERIZATION  2014   COLONOSCOPY  2009, 2012    hyperplastic rectal polyp 2012 tubular adenoma of proximal ascending colon 2000 9 repeat exam due to 2017.   COLONOSCOPY WITH PROPOFOL N/A 11/06/2016   Procedure: COLONOSCOPY WITH PROPOFOL;  Surgeon: Robert Bellow, MD;  Location: [facility] ENDOSCOPY;  Service: Endoscopy;  Laterality: N/A;   COLONOSCOPY WITH PROPOFOL N/A 02/27/2019   Procedure: COLONOSCOPY WITH PROPOFOL;  Surgeon: Robert Bellow, MD;  Location: [facility] ENDOSCOPY;  Service: General;  Laterality: N/A;   CORONARY ARTERY BYPASS GRAFT  1999   FLEXIBLE SIGMOIDOSCOPY  1998    FAMILY HISTORY :   Family History  Problem Relation Age of Onset   Lung cancer Father         Died age 57   Heart disease Mother        Died age 43   Asthma Mother    CAD Brother        double bypass   Heart attack Sister    Tuberculosis Sister        85 years old   Lung cancer Sister        Died in her 50s   Angina Sister    Breast cancer Sister 38   Rheum arthritis Sister    Aneurysm Sister        Brain, died age 82   Angina Sister    Heart disease Brother        Stent placement   Heart disease Brother        stent placement   Cancer Other        breast cancer niece, pancreatic cancer niece   Breast cancer Other    Ovarian cancer Neg Hx    Diabetes Neg Hx     SOCIAL HISTORY:   Social History   Tobacco Use   Smoking status: Never   Smokeless tobacco: Never  Vaping Use   Vaping Use: Never used  Substance Use Topics   Alcohol use: Yes    Alcohol/week: 0.0 - 7.0 standard drinks  Comment: 1 drink a day of wine or cocktail   Drug use: No    ALLERGIES:  is allergic to pravastatin.  MEDICATIONS:  Current Outpatient Medications  Medication Sig Dispense Refill   Docusate Calcium (STOOL SOFTENER PO) Take by mouth as needed.      ezetimibe (ZETIA) 10 MG tablet Take 1 tablet (10 mg total) daily by mouth. 30 tablet 12   isosorbide mononitrate (IMDUR) 30 MG 24 hr tablet Take 30 mg by mouth daily.      levothyroxine (SYNTHROID) 88 MCG tablet TAKE 1 TABLET BY MOUTH  DAILY BEFORE BREAKFAST 90 tablet 1   lisinopril-hydrochlorothiazide (ZESTORETIC) 20-12.5 MG tablet TAKE 1 TABLET BY MOUTH  DAILY 90 tablet 1   melatonin 5 MG TABS Take 5 mg by mouth.     metoprolol succinate (TOPROL-XL) 25 MG 24 hr tablet Take 25 mg by mouth daily.      ondansetron (ZOFRAN) 4 MG tablet Take 1 tablet (4 mg total) by mouth every 8 (eight) hours as needed for nausea or vomiting. 40 tablet 3   oxybutynin (DITROPAN) 5 MG tablet TAKE 1 TABLET BY MOUTH  TWICE DAILY 180 tablet 3   rosuvastatin (CRESTOR) 5 MG tablet Take 5 mg by mouth daily. Takes 3 times a week     venetoclax  (VENCLEXTA) 100 MG tablet TAKE 3 TABLETS BY MOUTH DAILY 90 tablet 2   XARELTO 15 MG TABS tablet Take 15 mg by mouth daily.     nitrofurantoin, macrocrystal-monohydrate, (MACROBID) 100 MG capsule Take 1 capsule (100 mg total) by mouth 2 (two) times daily. 10 capsule 5   No current facility-administered medications for this visit.   Facility-Administered Medications Ordered in Other Visits  Medication Dose Route Frequency Provider Last Rate Last Admin   heparin lock flush 100 unit/mL  500 Units Intravenous Once Charlaine Dalton R, MD       sodium chloride flush (NS) 0.9 % injection 10 mL  10 mL Intravenous PRN Cammie Sickle, MD        PHYSICAL EXAMINATION: ECOG PERFORMANCE STATUS: 1 - Symptomatic but completely ambulatory  BP 106/68   Pulse (!) 52   Temp (!) 95.2 F (35.1 C) (Oral)   Resp 20   Ht 5\' 4"  (1.626 m)   Wt 132 lb (59.9 kg)   BMI 22.66 kg/m   Filed Weights   08/03/20 1041  Weight: 132 lb (59.9 kg)    Physical Exam Constitutional:      Comments: Walking independently.  Accompanied by her husband.  HENT:     Head: Normocephalic and atraumatic.     Mouth/Throat:     Pharynx: No oropharyngeal exudate.  Eyes:     Pupils: Pupils are equal, round, and reactive to light.  Cardiovascular:     Rate and Rhythm: Normal rate and regular rhythm.  Pulmonary:     Effort: No respiratory distress.     Breath sounds: No wheezing.  Abdominal:     General: Bowel sounds are normal. There is no distension.     Palpations: Abdomen is soft. There is no mass.     Tenderness: There is no abdominal tenderness. There is no guarding or rebound.  Musculoskeletal:        General: No tenderness. Normal range of motion.     Cervical back: Normal range of motion and neck supple.  Skin:    General: Skin is warm.  Neurological:     Mental Status: She is alert and oriented to person,  place, and time.  Psychiatric:        Mood and Affect: Affect normal.    LABORATORY DATA:  I  have reviewed the data as listed    Component Value Date/Time   NA 137 08/03/2020 1000   NA 139 07/03/2017 1425   NA 141 05/11/2014 0853   K 3.5 08/03/2020 1000   K 3.6 05/11/2014 0853   CL 99 08/03/2020 1000   CL 103 05/11/2014 0853   CO2 28 08/03/2020 1000   CO2 29 05/11/2014 0853   GLUCOSE 107 (H) 08/03/2020 1000   GLUCOSE 107 (H) 05/11/2014 0853   BUN 15 08/03/2020 1000   BUN 19 07/03/2017 1425   BUN 22 (H) 05/11/2014 0853   CREATININE 0.94 08/03/2020 1000   CREATININE 1.07 (H) 05/11/2014 0853   CALCIUM 9.5 08/03/2020 1000   CALCIUM 10.1 05/11/2014 0853   PROT 6.8 08/03/2020 1000   PROT 6.4 07/03/2017 1425   PROT 7.3 05/11/2014 0853   ALBUMIN 4.3 08/03/2020 1000   ALBUMIN 4.3 07/03/2017 1425   ALBUMIN 4.7 05/11/2014 0853   AST 35 08/03/2020 1000   AST 29 05/11/2014 0853   ALT 27 08/03/2020 1000   ALT 20 05/11/2014 0853   ALKPHOS 65 08/03/2020 1000   ALKPHOS 47 05/11/2014 0853   BILITOT 1.4 (H) 08/03/2020 1000   BILITOT 0.5 07/03/2017 1425   BILITOT 1.0 05/11/2014 0853   GFRNONAA >60 08/03/2020 1000   GFRNONAA 51 (L) 05/11/2014 0853   GFRAA >60 08/05/2019 0941   GFRAA 59 (L) 05/11/2014 0853    No results found for: SPEP, UPEP  Lab Results  Component Value Date   WBC 3.2 (L) 08/03/2020   NEUTROABS 2.0 08/03/2020   HGB 13.1 08/03/2020   HCT 37.3 08/03/2020   MCV 95.2 08/03/2020   PLT 71 (L) 08/03/2020      Chemistry      Component Value Date/Time   NA 137 08/03/2020 1000   NA 139 07/03/2017 1425   NA 141 05/11/2014 0853   K 3.5 08/03/2020 1000   K 3.6 05/11/2014 0853   CL 99 08/03/2020 1000   CL 103 05/11/2014 0853   CO2 28 08/03/2020 1000   CO2 29 05/11/2014 0853   BUN 15 08/03/2020 1000   BUN 19 07/03/2017 1425   BUN 22 (H) 05/11/2014 0853   CREATININE 0.94 08/03/2020 1000   CREATININE 1.07 (H) 05/11/2014 0853   GLU 98 05/25/2013 0000      Component Value Date/Time   CALCIUM 9.5 08/03/2020 1000   CALCIUM 10.1 05/11/2014 0853   ALKPHOS 65  08/03/2020 1000   ALKPHOS 47 05/11/2014 0853   AST 35 08/03/2020 1000   AST 29 05/11/2014 0853   ALT 27 08/03/2020 1000   ALT 20 05/11/2014 0853   BILITOT 1.4 (H) 08/03/2020 1000   BILITOT 0.5 07/03/2017 1425   BILITOT 1.0 05/11/2014 0853       RADIOGRAPHIC STUDIES: I have personally reviewed the radiological images as listed and agreed with the findings in the report. No results found.   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 April 2022-no findings suggestive of active lymphoma; see below regarding incidental findings; STABLE  #Continue venetoclax at current dose.  Tolerating well without any major side effects-except platelets in 70s.  Hold off any imaging at this time unless any clinical symptoms of  progression noted.  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 Fib-stable continue Xarelto [Dr.Kowalski].STABLE; on renally adjusted Xarelto  20 mg/day [per cards].  # Weight loss/poor taste- [7 pounds in 3 months-]; check TSH.   # DISPOSITION: add to TSH - mychart # follow up in 3 months-MD: labs- cbc/cmp/ldh;- Dr.B       Orders Placed This Encounter  Procedures   TSH    Standing Status:   Future    Number of Occurrences:   1    Standing Expiration Date:   08/03/2021    All questions were answered. The patient knows to call the clinic with any problems, questions or concerns.      Cammie Sickle, MD 08/04/2020 10:53 PM

## 2020-08-05 ENCOUNTER — Telehealth: Payer: Self-pay | Admitting: Internal Medicine

## 2020-08-05 NOTE — Telephone Encounter (Signed)
On 7/15-I spoke to patient regarding low TSH; possible reason for her ongoing weight loss.  Patient understand that she will likely need a smaller dose of Synthroid.  However I will defer to Dr. [name] for further management.   Dr.Gilbert-I would appreciate your help in adjusting her thyroid medications.   GB

## 2020-08-09 MED ORDER — LEVOTHYROXINE SODIUM 50 MCG PO TABS: 50.0000 ug | ORAL_TABLET | Freq: Every day | ORAL | 1 refills | Status: DC

## 2020-08-09 NOTE — Telephone Encounter (Signed)
Advised patient. Medication sent in at local pharmacy per patient request.

## 2020-08-09 NOTE — Addendum Note (Signed)
Addended by: Wilburt Finlay on: 08/09/2020 11:11 AM   Modules accepted: Orders

## 2020-08-10 ENCOUNTER — Other Ambulatory Visit (HOSPITAL_COMMUNITY): Payer: Self-pay

## 2020-08-31 ENCOUNTER — Other Ambulatory Visit (HOSPITAL_COMMUNITY): Payer: Self-pay

## 2020-09-02 ENCOUNTER — Other Ambulatory Visit (HOSPITAL_COMMUNITY): Payer: Self-pay

## 2020-09-06 ENCOUNTER — Other Ambulatory Visit (HOSPITAL_COMMUNITY): Payer: Self-pay

## 2020-09-07 ENCOUNTER — Other Ambulatory Visit (HOSPITAL_COMMUNITY): Payer: Self-pay

## 2020-09-22 ENCOUNTER — Other Ambulatory Visit: Payer: Self-pay | Admitting: Family Medicine

## 2020-09-22 NOTE — Telephone Encounter (Signed)
Requested Prescriptions  Pending Prescriptions Disp Refills  . lisinopril-hydrochlorothiazide (ZESTORETIC) 20-12.5 MG tablet [Pharmacy Med Name: Lisinopril-hydroCHLOROthiazide 20-12.5 MG Oral Tablet] 90 tablet 0    Sig: TAKE 1 TABLET BY MOUTH  DAILY     Cardiovascular:  ACEI + Diuretic Combos Passed - 09/22/2020  6:14 AM      Passed - Na in normal range and within 180 days    Sodium  Date Value Ref Range Status  08/03/2020 137 135 - 145 mmol/L Final  07/03/2017 139 134 - 144 mmol/L Final  05/11/2014 141 mmol/L Final    Comment:    135-145 NOTE: New Reference Range  03/30/14          Passed - K in normal range and within 180 days    Potassium  Date Value Ref Range Status  08/03/2020 3.5 3.5 - 5.1 mmol/L Final  05/11/2014 3.6 mmol/L Final    Comment:    3.5-5.1 NOTE: New Reference Range  03/30/14          Passed - Cr in normal range and within 180 days    Creatinine  Date Value Ref Range Status  05/11/2014 1.07 (H) mg/dL Final    Comment:    0.44-1.00 NOTE: New Reference Range  03/30/14    Creatinine, Ser  Date Value Ref Range Status  08/03/2020 0.94 0.44 - 1.00 mg/dL Final         Passed - Ca in normal range and within 180 days    Calcium  Date Value Ref Range Status  08/03/2020 9.5 8.9 - 10.3 mg/dL Final   Calcium, Total  Date Value Ref Range Status  05/11/2014 10.1 mg/dL Final    Comment:    8.9-10.3 NOTE: New Reference Range  03/30/14          Passed - Patient is not pregnant      Passed - Last BP in normal range    BP Readings from Last 1 Encounters:  08/03/20 106/68         Passed - Valid encounter within last 6 months    Recent Outpatient Visits          4 months ago [place] [name], MD   10 months ago Adult hypothyroidism   [facility] [name], MD   1 year ago Adult hypothyroidism   [facility] [name], MD   1 year ago  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 levothyroxine (SYNTHROID) 88 MCG tablet [Pharmacy Med Name: Levothyroxine Sodium 88 MCG Oral Tablet] 90 tablet 3    Sig: TAKE 1 TABLET BY MOUTH  DAILY BEFORE BREAKFAST     Endocrinology:  Hypothyroid Agents Failed - 09/22/2020  6:14 AM      Failed - TSH needs to be rechecked within 3 months after an abnormal result. Refill until TSH is due.      Failed - TSH in normal range and within 360 days    TSH  Date Value Ref Range Status  08/03/2020 0.190 (L) 0.350 - 4.500 uIU/mL Final    Comment:    Performed by a 3rd Generation assay with a functional sensitivity of <=0.01 uIU/mL. 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

## 2020-09-22 NOTE — Telephone Encounter (Signed)
Requested medications are due for refill today.  unknown  Requested medications are on the active medications list.  no  Last refill. 05/19/2020  Future visit scheduled.   yes  Notes to clinic.  Medication was discontinued 08/10/2019

## 2020-10-05 ENCOUNTER — Other Ambulatory Visit (HOSPITAL_COMMUNITY): Payer: Self-pay

## 2020-10-05 ENCOUNTER — Other Ambulatory Visit: Payer: Self-pay | Admitting: Internal Medicine

## 2020-10-05 DIAGNOSIS — C8308 Small cell B-cell lymphoma, lymph nodes of multiple sites: Secondary | ICD-10-CM

## 2020-10-06 ENCOUNTER — Other Ambulatory Visit (HOSPITAL_COMMUNITY): Payer: Self-pay

## 2020-10-06 ENCOUNTER — Encounter: Payer: Self-pay | Admitting: Internal Medicine

## 2020-10-06 ENCOUNTER — Other Ambulatory Visit: Payer: Self-pay | Admitting: Family Medicine

## 2020-10-06 MED ORDER — VENETOCLAX 100 MG PO TABS
ORAL_TABLET | Freq: Every day | ORAL | 2 refills | Status: DC
  Filled 2020-10-06: qty 90, 30d supply, fill #0
  Filled 2020-11-03: qty 90, 30d supply, fill #1
  Filled 2020-12-05: qty 90, 30d supply, fill #2

## 2020-10-06 NOTE — Telephone Encounter (Signed)
Requested medication (s) are due for refill today  Yes  Requested medication (s) are on the active medication list Yes   Future visit scheduled  11/02/20  Note to clinic-Patient has 2 different dosages on medication list, 50 and 88 mcg. Last ordered was Levothyroxine 88 mcg on 09/22/20. Latest TSH 2 months ago 0.190. Routing to office for clarification/approval.   Requested Prescriptions  Pending Prescriptions Disp Refills   levothyroxine (SYNTHROID) 50 MCG tablet [Pharmacy Med Name: LEVOTHYROXINE 0.'05MG'$  (50MCG) TAB] 90 tablet     Sig: TAKE 1 TABLET(50 MCG) BY MOUTH DAILY BEFORE AND BREAKFAST     There is no refill protocol information for this order

## 2020-10-07 NOTE — Telephone Encounter (Signed)
Patient says that she is taking 72mg ok to refill?

## 2020-11-01 ENCOUNTER — Other Ambulatory Visit: Payer: Self-pay

## 2020-11-01 ENCOUNTER — Other Ambulatory Visit: Payer: Self-pay | Admitting: *Deleted

## 2020-11-01 DIAGNOSIS — C8308 Small cell B-cell lymphoma, lymph nodes of multiple sites: Secondary | ICD-10-CM

## 2020-11-02 ENCOUNTER — Inpatient Hospital Stay (HOSPITAL_BASED_OUTPATIENT_CLINIC_OR_DEPARTMENT_OTHER): Payer: Medicare Other | Admitting: Internal Medicine

## 2020-11-02 ENCOUNTER — Other Ambulatory Visit: Payer: Self-pay

## 2020-11-02 ENCOUNTER — Encounter: Payer: Self-pay | Admitting: Family Medicine

## 2020-11-02 ENCOUNTER — Ambulatory Visit (INDEPENDENT_AMBULATORY_CARE_PROVIDER_SITE_OTHER): Payer: Medicare Other | Admitting: Family Medicine

## 2020-11-02 ENCOUNTER — Inpatient Hospital Stay: Payer: Medicare Other | Attending: Internal Medicine

## 2020-11-02 ENCOUNTER — Encounter: Payer: Self-pay | Admitting: Internal Medicine

## 2020-11-02 VITALS — BP 139/86 | HR 86 | Temp 97.6°F | Resp 16 | Wt 134.6 lb

## 2020-11-02 VITALS — BP 131/81 | HR 65 | Resp 16 | Wt 136.0 lb

## 2020-11-02 DIAGNOSIS — Z23 Encounter for immunization: Secondary | ICD-10-CM | POA: Diagnosis not present

## 2020-11-02 DIAGNOSIS — Z9221 Personal history of antineoplastic chemotherapy: Secondary | ICD-10-CM | POA: Diagnosis not present

## 2020-11-02 DIAGNOSIS — I4891 Unspecified atrial fibrillation: Secondary | ICD-10-CM | POA: Insufficient documentation

## 2020-11-02 DIAGNOSIS — Z79899 Other long term (current) drug therapy: Secondary | ICD-10-CM | POA: Diagnosis not present

## 2020-11-02 DIAGNOSIS — I25810 Atherosclerosis of coronary artery bypass graft(s) without angina pectoris: Secondary | ICD-10-CM

## 2020-11-02 DIAGNOSIS — M7989 Other specified soft tissue disorders: Secondary | ICD-10-CM | POA: Insufficient documentation

## 2020-11-02 DIAGNOSIS — C8588 Other specified types of non-Hodgkin lymphoma, lymph nodes of multiple sites: Secondary | ICD-10-CM | POA: Insufficient documentation

## 2020-11-02 DIAGNOSIS — I872 Venous insufficiency (chronic) (peripheral): Secondary | ICD-10-CM | POA: Diagnosis not present

## 2020-11-02 DIAGNOSIS — C8308 Small cell B-cell lymphoma, lymph nodes of multiple sites: Secondary | ICD-10-CM

## 2020-11-02 DIAGNOSIS — E7800 Pure hypercholesterolemia, unspecified: Secondary | ICD-10-CM

## 2020-11-02 DIAGNOSIS — D696 Thrombocytopenia, unspecified: Secondary | ICD-10-CM | POA: Diagnosis not present

## 2020-11-02 DIAGNOSIS — I4819 Other persistent atrial fibrillation: Secondary | ICD-10-CM

## 2020-11-02 DIAGNOSIS — Z7901 Long term (current) use of anticoagulants: Secondary | ICD-10-CM | POA: Diagnosis not present

## 2020-11-02 DIAGNOSIS — E039 Hypothyroidism, unspecified: Secondary | ICD-10-CM | POA: Diagnosis not present

## 2020-11-02 LAB — CBC WITH DIFFERENTIAL/PLATELET
Abs Immature Granulocytes: 0 10*3/uL (ref 0.00–0.07)
Basophils Absolute: 0 10*3/uL (ref 0.0–0.1)
Basophils Relative: 1 %
Eosinophils Absolute: 0 10*3/uL (ref 0.0–0.5)
Eosinophils Relative: 0 %
HCT: 36.7 % (ref 36.0–46.0)
Hemoglobin: 12.5 g/dL (ref 12.0–15.0)
Immature Granulocytes: 0 %
Lymphocytes Relative: 23 %
Lymphs Abs: 0.7 10*3/uL (ref 0.7–4.0)
MCH: 33.1 pg (ref 26.0–34.0)
MCHC: 34.1 g/dL (ref 30.0–36.0)
MCV: 97.1 fL (ref 80.0–100.0)
Monocytes Absolute: 0.4 10*3/uL (ref 0.1–1.0)
Monocytes Relative: 13 %
Neutro Abs: 1.9 10*3/uL (ref 1.7–7.7)
Neutrophils Relative %: 63 %
Platelets: 75 10*3/uL — ABNORMAL LOW (ref 150–400)
RBC: 3.78 MIL/uL — ABNORMAL LOW (ref 3.87–5.11)
RDW: 15.1 % (ref 11.5–15.5)
WBC: 3 10*3/uL — ABNORMAL LOW (ref 4.0–10.5)
nRBC: 0 % (ref 0.0–0.2)

## 2020-11-02 LAB — COMPREHENSIVE METABOLIC PANEL
ALT: 29 U/L (ref 0–44)
AST: 39 U/L (ref 15–41)
Albumin: 4.1 g/dL (ref 3.5–5.0)
Alkaline Phosphatase: 62 U/L (ref 38–126)
Anion gap: 10 (ref 5–15)
BUN: 23 mg/dL (ref 8–23)
CO2: 28 mmol/L (ref 22–32)
Calcium: 9.1 mg/dL (ref 8.9–10.3)
Chloride: 101 mmol/L (ref 98–111)
Creatinine, Ser: 0.96 mg/dL (ref 0.44–1.00)
GFR, Estimated: 59 mL/min — ABNORMAL LOW (ref >60)
Glucose, Bld: 98 mg/dL (ref 70–99)
Potassium: 3.3 mmol/L — ABNORMAL LOW (ref 3.5–5.1)
Sodium: 139 mmol/L (ref 135–145)
Total Bilirubin: 1.1 mg/dL (ref 0.3–1.2)
Total Protein: 6.6 g/dL (ref 6.5–8.1)

## 2020-11-02 LAB — LACTATE DEHYDROGENASE: LDH: 188 U/L (ref 98–192)

## 2020-11-02 MED ORDER — POTASSIUM CHLORIDE ER 10 MEQ PO TBCR: 10.0000 meq | EXTENDED_RELEASE_TABLET | Freq: Every day | ORAL | 5 refills | Status: DC

## 2020-11-02 MED ORDER — FUROSEMIDE 20 MG PO TABS: 20.0000 mg | ORAL_TABLET | ORAL | 5 refills | Status: DC

## 2020-11-02 NOTE — Progress Notes (Signed)
Established patient visit   Patient: Jacqueline Webb   DOB: Nov 09, 1940   79 y.o. Female  MRN: [id number] Visit Date: 11/02/2020  Today's healthcare provider: [name], MD   Chief Complaint  Patient presents with   Follow-up   Hypertension   Subjective    HPI  Patient comes in today for follow-up.  She has follow-up tomorrow for B-cell lymphoma with oncology She has no chest pain shortness of breath PND orthopnea but does have increased edema of lower extremities. Hypothyroid, follow-up  Lab Results  Component Value Date   TSH 0.190 (L) 08/03/2020   TSH 0.765 11/04/2019   TSH 0.608 05/06/2019   Wt Readings from Last 3 Encounters:  11/02/20 136 lb (61.7 kg)  11/02/20 134 lb 9.6 oz (61.1 kg)  08/03/20 132 lb (59.9 kg)    She was last seen for hypothyroid 6 months ago.  Management since that visit includes; on levothyroxine 50 mcg. She reports good compliance with treatment. She is not having side effects. none  -----------------------------------------------------------------------------------------     Medications: Outpatient Medications Prior to Visit  Medication Sig   Docusate Calcium (STOOL SOFTENER PO) Take by mouth as needed.    ezetimibe (ZETIA) 10 MG tablet Take 1 tablet (10 mg total) daily by mouth.   levothyroxine (SYNTHROID) 50 MCG tablet TAKE 1 TABLET(50 MCG) BY MOUTH DAILY BEFORE AND BREAKFAST   lisinopril-hydrochlorothiazide (ZESTORETIC) 20-12.5 MG tablet TAKE 1 TABLET BY MOUTH  DAILY   melatonin 5 MG TABS Take 5 mg by mouth.   metoprolol succinate (TOPROL-XL) 25 MG 24 hr tablet Take 25 mg by mouth daily.    nitrofurantoin, macrocrystal-monohydrate, (MACROBID) 100 MG capsule Take 1 capsule (100 mg total) by mouth 2 (two) times daily.   ondansetron (ZOFRAN) 4 MG tablet Take 1 tablet (4 mg total) by mouth every 8 (eight) hours as needed for nausea or vomiting.   oxybutynin (DITROPAN) 5 MG tablet TAKE 1 TABLET BY MOUTH  TWICE DAILY    rosuvastatin (CRESTOR) 5 MG tablet Take 5 mg by mouth daily. Takes 3 times a week   venetoclax (VENCLEXTA) 100 MG tablet TAKE 3 TABLETS BY MOUTH DAILY   XARELTO 20 MG TABS tablet Take 20 mg by mouth daily.   isosorbide mononitrate (IMDUR) 30 MG 24 hr tablet Take 30 mg by mouth daily.  (Patient not taking: Reported on 11/02/2020)   Facility-Administered Medications Prior to Visit  Medication Dose Route Frequency Provider   heparin lock flush 100 unit/mL  500 Units Intravenous Once Charlaine Dalton R, MD   sodium chloride flush (NS) 0.9 % injection 10 mL  10 mL Intravenous PRN Cammie Sickle, MD    Review of Systems  Constitutional:  Negative for appetite change, chills, fatigue and fever.  Respiratory:  Negative for chest tightness and shortness of breath.   Cardiovascular:  Negative for chest pain and palpitations.  Gastrointestinal:  Negative for abdominal pain, nausea and vomiting.  Neurological:  Negative for dizziness and weakness.       Objective    BP 131/81 (BP Location: Right Arm, Patient Position: Sitting, Cuff Size: Normal)   Pulse 65   Resp 16   Wt 136 lb (61.7 kg)   SpO2 99%   BMI 23.34 kg/m     Physical Exam Vitals reviewed.  Constitutional:      Appearance: She is well-developed.  HENT:     Head: Normocephalic and atraumatic.     Right Ear: External ear normal.  Left Ear: External ear normal.     Nose: Nose normal.  Eyes:     General: No scleral icterus.    Conjunctiva/sclera: Conjunctivae normal.  Neck:     Thyroid: No thyromegaly.  Cardiovascular:     Rate and Rhythm: Normal rate and regular rhythm.     Heart sounds: Normal heart sounds.  Pulmonary:     Effort: Pulmonary effort is normal.     Breath sounds: Normal breath sounds.  Abdominal:     Palpations: Abdomen is soft.  Musculoskeletal:     Right lower leg: Edema present.     Left lower leg: Edema present.  Skin:    General: Skin is warm and dry.  Neurological:     General: No  focal deficit present.     Mental Status: She is alert and oriented to person, place, and time.  Psychiatric:        Mood and Affect: Mood normal.        Behavior: Behavior normal.        Thought Content: Thought content normal.        Judgment: Judgment normal.      Results for orders placed or performed in visit on 11/02/20  CBC with Differential  Result Value Ref Range   WBC 3.0 (L) 4.0 - 10.5 K/uL   RBC 3.78 (L) 3.87 - 5.11 MIL/uL   Hemoglobin 12.5 12.0 - 15.0 g/dL   HCT 36.7 36.0 - 46.0 %   MCV 97.1 80.0 - 100.0 fL   MCH 33.1 26.0 - 34.0 pg   MCHC 34.1 30.0 - 36.0 g/dL   RDW 15.1 11.5 - 15.5 %   Platelets 75 (L) 150 - 400 K/uL   nRBC 0.0 0.0 - 0.2 %   Neutrophils Relative % 63 %   Neutro Abs 1.9 1.7 - 7.7 K/uL   Lymphocytes Relative 23 %   Lymphs Abs 0.7 0.7 - 4.0 K/uL   Monocytes Relative 13 %   Monocytes Absolute 0.4 0.1 - 1.0 K/uL   Eosinophils Relative 0 %   Eosinophils Absolute 0.0 0.0 - 0.5 K/uL   Basophils Relative 1 %   Basophils Absolute 0.0 0.0 - 0.1 K/uL   Immature Granulocytes 0 %   Abs Immature Granulocytes 0.00 0.00 - 0.07 K/uL  Lactate dehydrogenase  Result Value Ref Range   LDH 188 98 - 192 U/L  Comprehensive metabolic panel  Result Value Ref Range   Sodium 139 135 - 145 mmol/L   Potassium 3.3 (L) 3.5 - 5.1 mmol/L   Chloride 101 98 - 111 mmol/L   CO2 28 22 - 32 mmol/L   Glucose, Bld 98 70 - 99 mg/dL   BUN 23 8 - 23 mg/dL   Creatinine, Ser 0.96 0.44 - 1.00 mg/dL   Calcium 9.1 8.9 - 10.3 mg/dL   Total Protein 6.6 6.5 - 8.1 g/dL   Albumin 4.1 3.5 - 5.0 g/dL   AST 39 15 - 41 U/L   ALT 29 0 - 44 U/L   Alkaline Phosphatase 62 38 - 126 U/L   Total Bilirubin 1.1 0.3 - 1.2 mg/dL   GFR, Estimated 59 (L) >60 mL/min   Anion gap 10 5 - 15    Assessment & Plan     1. Adult hypothyroidism   2. Need for immunization against influenza  - Flu Vaccine QUAD High Dose(Fluad)  3. Venous insufficiency of both lower extremities Consider diuresing.  Has  imaging arranged through [facility] for follow-up  of possible pelvic tumors contributing to lower extremity edema Patient having no signs or symptoms of heart failure.  This time try Lasix 20 mg every morning with KCl to daily she takes furosemide 4. Persistent atrial fibrillation ([place])   5. Coronary artery disease involving coronary bypass graft of native heart without angina pectoris Risk factors treated. 6. 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 Hypercholesteremia    No follow-ups on file.      I, [name], MD, have reviewed all documentation for this visit. The documentation on 11/12/20 for the exam, diagnosis, procedures, and orders are all accurate and complete.    Aime Carreras Cranford Mon, MD  [facility] [phone] (phone) [phone] (fax)  [place]

## 2020-11-02 NOTE — Patient Instructions (Addendum)
TAKE KLOR-CON WHEN TAKING FUROSEMIDE.

## 2020-11-02 NOTE — Progress Notes (Signed)
Patient here for follow up she has no concerns or complaints today.

## 2020-11-02 NOTE — Assessment & Plan Note (Addendum)
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 April 2022-no findings suggestive of active lymphoma- STABLE.  #Continue venetoclax at current dose.  Tolerating well without any major side effects-except platelets in 70s.  We will plan to get imaging in 3 months.  Ordered today.  # Thrombocytopenia-75- likely secondary to venetoclax/CLL-se below [anti-coagulation; Xarelto 20 mg a day] -STABLE  # A. Fib-stable continue Xarelto [Dr.Kowalski].STABLE  #Bilateral lower extremity swelling-slightly worse compared to previous exams.  Unlikely related to CLL/treatment.  Reviewed the recent cardiology evaluation.  Discussed reaching out to cardiology regarding management with diuretics.  Patient stated she is on Zestoretic at this time; wants to hold off changing the medication at this time.   #Left hip pain/radiating to the lower extremity-question arthritis; awaiting evaluation with Dr. [name] at this morning.  Clinically not suggestive of progression of CLL causing symptoms.   # DISPOSITION:  # follow up in 3 months-MD: labs- cbc/cmp/ldh;CT CAP- Dr.B

## 2020-11-02 NOTE — Progress Notes (Signed)
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 Lymphoma low-grade, status Rituxan therapy. 2. Recurrent disease by clinical examination in December of 2012 3. Ovarian mass on PET scan in December of 2012 (normal CA 125). Ovarian mass has resolved after rituximab therapy. 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  Patient had total 5 cycles of chemotherapy with bendamustine and rituximab and 6 cycle was omitted because of significant side effects with and weakness and fatigue.  # OCT 2016- PET significant response; ON surveillance  # 18th OCT 2018- [place];SEP 2018-/OCT 2018 CT scan- progression; s/p 6 cycles [#6 in April 2019]  # JAN 2020-CT scan shows progressive disease; start a acalabrutinib 100mg  BID;# feb 6th2020- decrease dose of acalbrutinib to 100 mg/day [sec to spon bruising] March 1st week- STOPPED [place] sec to spon brusing  # MARCH 17th 2020- Start Venetoclax [out pt ramp up]; 300 mg a day   # 11/06/2016- colonoscopy [Dr.Byrnett- 2 polyps]  #History of A.  Fib-Xarelto; CKD stage II-III.   # #Right posterior neck melanoma stage I [ Dr.Lee/Elkins]..   -------------------------------------------------------------------    # left ? Adnexal mass- ? Etiology; mild SUV uptake incidental [stable compared to previous PET in 2016] Previous workup 2015- vaginal ultrasound negative. MRI February 2018- approximately 3 cm in size; slow increase in the size of the left adnexal mass over at least 3 years- suspect involvement of lymphoma rather than a primary gynecologic malignancy. --------------------------------------------------------------   DIAGNOSIS: SMALL LYMPHOCYTIC LEUKEMIA  STAGE:  IV    ;GOALS: control/pallaitive  CURRENT/MOST RECENT THERAPY: Venatoclax.    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  Jacqueline Webb 80 y.o.  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  In the interim patient lost her son-of heart attack August.  She is quite distressed.  S/p recent evaluation with cardiology.  Patient appears fatigued.  Denies any new lumps bumps.  Appetite is fair at best.  Seems to have lost weight. Noticed to have leg swelling slightly increased in the last few months.   Patient complains of pain in her left hip radiating to the leg in the past 2 to 3 weeks.   No diarrhea no bleeding.  Review of Systems  Constitutional:  Positive for malaise/fatigue and weight loss. Negative for chills, diaphoresis and fever.  HENT:  Negative for nosebleeds and sore throat.   Eyes:  Negative  for double vision.  Respiratory:  Negative for cough, hemoptysis, sputum production, shortness of breath and wheezing.   Cardiovascular:   Positive for leg swelling. Negative for chest pain, palpitations and orthopnea.  Gastrointestinal:  Negative for abdominal pain, blood in stool, constipation, diarrhea, heartburn, melena and vomiting.  Genitourinary:  Negative for dysuria, frequency and urgency.  Musculoskeletal:  Positive for back pain and joint pain.  Neurological:  Negative for dizziness, tingling, focal weakness, weakness and headaches.  Psychiatric/Behavioral:  Negative for depression. The patient is not nervous/anxious and does not have insomnia.    PAST MEDICAL HISTORY :  Past Medical History:  Diagnosis Date   A-fib [facility])    Benign neoplasm of rectum and anal canal    Chronic leukemia of unspecified cell type, without mention of having achieved remission    Dysrhythmia    A-Fib   Hypercholesteremia    Hypertension    Hyperthyroidism    Lymphoma ([place]) 2007   Non Hodgkin's lymphoma ([place]) 01/22/2006   Personal history of chemotherapy     PAST SURGICAL HISTORY :   Past Surgical History:  Procedure Laterality Date   ABDOMINAL HYSTERECTOMY  1958   APPENDECTOMY     BREAST BIOPSY Right 1970   right breast biopsy with clip- neg   BREAST BIOPSY Left 2002   left breast biopsy with clip-neg   BREAST BIOPSY Right 2014   right breast biopsy with clip-neg   BREAST CYST ASPIRATION Bilateral 2000   bilateral fine needle aspiration   BREAST SURGERY Left 1990   biopsy   BREAST SURGERY Right May 08, 2012   complex fibroadenoma without malignancy.   CARDIAC CATHETERIZATION  2014   COLONOSCOPY  2009, 2012    hyperplastic rectal polyp 2012 tubular adenoma of proximal ascending colon 2000 9 repeat exam due to 2017.   COLONOSCOPY WITH PROPOFOL N/A 11/06/2016   Procedure: COLONOSCOPY WITH PROPOFOL;  Surgeon: Robert Bellow, MD;  Location: [facility] ENDOSCOPY;  Service: Endoscopy;  Laterality: N/A;   COLONOSCOPY WITH PROPOFOL N/A 02/27/2019   Procedure: COLONOSCOPY WITH PROPOFOL;  Surgeon: Robert Bellow, MD;   Location: [facility] ENDOSCOPY;  Service: General;  Laterality: N/A;   CORONARY ARTERY BYPASS GRAFT  1999   FLEXIBLE SIGMOIDOSCOPY  1998    FAMILY HISTORY :   Family History  Problem Relation Age of Onset   Lung cancer Father        Died age 30   Heart disease Mother        Died age 67   Asthma Mother    CAD Brother        double bypass   Heart attack Sister    Tuberculosis Sister        83 years old   Lung cancer Sister        Died in her 16s   Angina Sister    Breast cancer Sister 27   Rheum arthritis Sister    Aneurysm Sister        Brain, died age 60   Angina Sister    Heart disease Brother        Stent placement   Heart disease Brother        stent placement   Cancer Other        breast cancer niece, pancreatic cancer niece   Breast cancer Other    Ovarian cancer Neg Hx    Diabetes Neg Hx     SOCIAL HISTORY:   Social History   Tobacco Use  Smoking status: Never   Smokeless tobacco: Never  Vaping Use   Vaping Use: Never used  Substance Use Topics   Alcohol use: Yes    Alcohol/week: 0.0 - 7.0 standard drinks    Comment: 1 drink a day of wine or cocktail   Drug use: No    ALLERGIES:  is allergic to pravastatin.  MEDICATIONS:  Current Outpatient Medications  Medication Sig Dispense Refill   Docusate Calcium (STOOL SOFTENER PO) Take by mouth as needed.      ezetimibe (ZETIA) 10 MG tablet Take 1 tablet (10 mg total) daily by mouth. 30 tablet 12   isosorbide mononitrate (IMDUR) 30 MG 24 hr tablet Take 30 mg by mouth daily.      levothyroxine (SYNTHROID) 50 MCG tablet TAKE 1 TABLET(50 MCG) BY MOUTH DAILY BEFORE AND BREAKFAST 90 tablet 3   lisinopril-hydrochlorothiazide (ZESTORETIC) 20-12.5 MG tablet TAKE 1 TABLET BY MOUTH  DAILY 90 tablet 0   melatonin 5 MG TABS Take 5 mg by mouth.     metoprolol succinate (TOPROL-XL) 25 MG 24 hr tablet Take 25 mg by mouth daily.      nitrofurantoin, macrocrystal-monohydrate, (MACROBID) 100 MG capsule Take 1 capsule (100 mg  total) by mouth 2 (two) times daily. 10 capsule 5   ondansetron (ZOFRAN) 4 MG tablet Take 1 tablet (4 mg total) by mouth every 8 (eight) hours as needed for nausea or vomiting. 40 tablet 3   oxybutynin (DITROPAN) 5 MG tablet TAKE 1 TABLET BY MOUTH  TWICE DAILY 180 tablet 3   rosuvastatin (CRESTOR) 5 MG tablet Take 5 mg by mouth daily. Takes 3 times a week     venetoclax (VENCLEXTA) 100 MG tablet TAKE 3 TABLETS BY MOUTH DAILY 90 tablet 2   XARELTO 20 MG TABS tablet Take 20 mg by mouth daily.     No current facility-administered medications for this visit.   Facility-Administered Medications Ordered in Other Visits  Medication Dose Route Frequency Provider Last Rate Last Admin   heparin lock flush 100 unit/mL  500 Units Intravenous Once Charlaine Dalton R, MD       sodium chloride flush (NS) 0.9 % injection 10 mL  10 mL Intravenous PRN Cammie Sickle, MD        PHYSICAL EXAMINATION: ECOG PERFORMANCE STATUS: 1 - Symptomatic but completely ambulatory  BP 139/86   Pulse 86   Temp 97.6 F (36.4 C) (Tympanic)   Resp 16   Wt 134 lb 9.6 oz (61.1 kg)   BMI 23.10 kg/m   Filed Weights   11/02/20 1016  Weight: 134 lb 9.6 oz (61.1 kg)   Bilateral lower extremity swelling.  Physical Exam HENT:     Head: Normocephalic and atraumatic.     Mouth/Throat:     Pharynx: No oropharyngeal exudate.  Eyes:     Pupils: Pupils are equal, round, and reactive to light.  Cardiovascular:     Rate and Rhythm: Normal rate and regular rhythm.  Pulmonary:     Effort: No respiratory distress.     Breath sounds: No wheezing.  Abdominal:     General: Bowel sounds are normal. There is no distension.     Palpations: Abdomen is soft. There is no mass.     Tenderness: There is no abdominal tenderness. There is no guarding or rebound.  Musculoskeletal:        General: No tenderness. Normal range of motion.     Cervical back: Normal range of motion and neck supple.  Skin:    General: Skin is warm.   Neurological:     Mental Status: She is alert and oriented to person, place, and time.  Psychiatric:        Mood and Affect: Affect normal.    LABORATORY DATA:  I have reviewed the data as listed    Component Value Date/Time   NA 139 11/02/2020 0937   NA 139 07/03/2017 1425   NA 141 05/11/2014 0853   K 3.3 (L) 11/02/2020 0937   K 3.6 05/11/2014 0853   CL 101 11/02/2020 0937   CL 103 05/11/2014 0853   CO2 28 11/02/2020 0937   CO2 29 05/11/2014 0853   GLUCOSE 98 11/02/2020 0937   GLUCOSE 107 (H) 05/11/2014 0853   BUN 23 11/02/2020 0937   BUN 19 07/03/2017 1425   BUN 22 (H) 05/11/2014 0853   CREATININE 0.96 11/02/2020 0937   CREATININE 1.07 (H) 05/11/2014 0853   CALCIUM 9.1 11/02/2020 0937   CALCIUM 10.1 05/11/2014 0853   PROT 6.6 11/02/2020 0937   PROT 6.4 07/03/2017 1425   PROT 7.3 05/11/2014 0853   ALBUMIN 4.1 11/02/2020 0937   ALBUMIN 4.3 07/03/2017 1425   ALBUMIN 4.7 05/11/2014 0853   AST 39 11/02/2020 0937   AST 29 05/11/2014 0853   ALT 29 11/02/2020 0937   ALT 20 05/11/2014 0853   ALKPHOS 62 11/02/2020 0937   ALKPHOS 47 05/11/2014 0853   BILITOT 1.1 11/02/2020 0937   BILITOT 0.5 07/03/2017 1425   BILITOT 1.0 05/11/2014 0853   GFRNONAA 59 (L) 11/02/2020 0937   GFRNONAA 51 (L) 05/11/2014 0853   GFRAA >60 08/05/2019 0941   GFRAA 59 (L) 05/11/2014 0853    No results found for: SPEP, UPEP  Lab Results  Component Value Date   WBC 3.0 (L) 11/02/2020   NEUTROABS 1.9 11/02/2020   HGB 12.5 11/02/2020   HCT 36.7 11/02/2020   MCV 97.1 11/02/2020   PLT 75 (L) 11/02/2020      Chemistry      Component Value Date/Time   NA 139 11/02/2020 0937   NA 139 07/03/2017 1425   NA 141 05/11/2014 0853   K 3.3 (L) 11/02/2020 0937   K 3.6 05/11/2014 0853   CL 101 11/02/2020 0937   CL 103 05/11/2014 0853   CO2 28 11/02/2020 0937   CO2 29 05/11/2014 0853   BUN 23 11/02/2020 0937   BUN 19 07/03/2017 1425   BUN 22 (H) 05/11/2014 0853   CREATININE 0.96 11/02/2020 0937    CREATININE 1.07 (H) 05/11/2014 0853   GLU 98 05/25/2013 0000      Component Value Date/Time   CALCIUM 9.1 11/02/2020 0937   CALCIUM 10.1 05/11/2014 0853   ALKPHOS 62 11/02/2020 0937   ALKPHOS 47 05/11/2014 0853   AST 39 11/02/2020 0937   AST 29 05/11/2014 0853   ALT 29 11/02/2020 0937   ALT 20 05/11/2014 0853   BILITOT 1.1 11/02/2020 0937   BILITOT 0.5 07/03/2017 1425   BILITOT 1.0 05/11/2014 0853       RADIOGRAPHIC STUDIES: I have personally reviewed the radiological images as listed and agreed with the findings in the report. No results found.   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 April 2022-no findings suggestive of active lymphoma- STABLE.  #Continue venetoclax at current dose.  Tolerating well without any major  side effects-except platelets in 70s.  We will plan to get imaging in 3 months.  Ordered today.  # Thrombocytopenia-75- likely secondary to venetoclax/CLL-se below [anti-coagulation; Xarelto 20 mg a day] -STABLE  # A. Fib-stable continue Xarelto [Dr.Kowalski].STABLE  #Bilateral lower extremity swelling-slightly worse compared to previous exams.  Unlikely related to CLL/treatment.  Reviewed the recent cardiology evaluation.  Discussed reaching out to cardiology regarding management with diuretics.  Patient stated she is on Zestoretic at this time; wants to hold off changing the medication at this time.   #Left hip pain/radiating to the lower extremity-question arthritis; awaiting evaluation with Dr. [name] at this morning.  Clinically not suggestive of progression of CLL causing symptoms.   # DISPOSITION:  # follow up in 3 months-MD: labs- cbc/cmp/ldh;CT CAP- Dr.B    Orders Placed This Encounter  Procedures   CT CHEST ABDOMEN PELVIS W CONTRAST    Standing Status:   Future    Standing Expiration Date:    11/02/2021    Order Specific Question:   Preferred imaging location?    Answer:   [facility]    Order Specific Question:   Radiology Contrast Protocol - do NOT remove file path    Answer:   [email or web address]   CBC with Differential    Standing Status:   Future    Standing Expiration Date:   11/02/2021   Lactate dehydrogenase    Standing Status:   Future    Standing Expiration Date:   11/02/2021   Comprehensive metabolic panel    Standing Status:   Future    Standing Expiration Date:   11/02/2021    All questions were answered. The patient knows to call the clinic with any problems, questions or concerns.      Cammie Sickle, MD 11/02/2020 11:34 AM

## 2020-11-03 ENCOUNTER — Other Ambulatory Visit (HOSPITAL_COMMUNITY): Payer: Self-pay

## 2020-11-03 ENCOUNTER — Ambulatory Visit: Payer: Self-pay | Admitting: Family Medicine

## 2020-11-08 ENCOUNTER — Other Ambulatory Visit (HOSPITAL_COMMUNITY): Payer: Self-pay

## 2020-12-05 ENCOUNTER — Other Ambulatory Visit (HOSPITAL_COMMUNITY): Payer: Self-pay

## 2020-12-08 ENCOUNTER — Other Ambulatory Visit (HOSPITAL_COMMUNITY): Payer: Self-pay

## 2020-12-30 ENCOUNTER — Telehealth: Payer: Self-pay | Admitting: Pharmacy Technician

## 2020-12-30 NOTE — Telephone Encounter (Signed)
Oral Oncology Patient Advocate Encounter   Was successful in securing patient an $53 grant from Patient [place] [facility]) to provide copayment coverage for Venclexta.  This will keep the out of pocket expense at $0.     The billing information is as follows and has been shared with [place].   Member ID: [id number] Group ID: [id number] RxBin: [id number] Dates of Eligibility: 01/24/21 through 01/23/22  Fund:  [place] Patient [place] Phone [phone] Fax [phone] 12/30/2020 9:25 AM

## 2021-01-02 ENCOUNTER — Encounter: Payer: Self-pay | Admitting: Internal Medicine

## 2021-01-02 ENCOUNTER — Other Ambulatory Visit (HOSPITAL_COMMUNITY): Payer: Self-pay

## 2021-01-03 ENCOUNTER — Other Ambulatory Visit (HOSPITAL_COMMUNITY): Payer: Self-pay

## 2021-01-03 ENCOUNTER — Other Ambulatory Visit: Payer: Self-pay | Admitting: Internal Medicine

## 2021-01-03 DIAGNOSIS — C8308 Small cell B-cell lymphoma, lymph nodes of multiple sites: Secondary | ICD-10-CM

## 2021-01-03 MED ORDER — VENETOCLAX 100 MG PO TABS
ORAL_TABLET | Freq: Every day | ORAL | 2 refills | Status: DC
  Filled 2021-01-03: qty 90, 30d supply, fill #0
  Filled 2021-02-07: qty 90, 30d supply, fill #1
  Filled 2021-03-03: qty 90, 30d supply, fill #2

## 2021-01-04 ENCOUNTER — Other Ambulatory Visit (HOSPITAL_COMMUNITY): Payer: Self-pay

## 2021-01-05 ENCOUNTER — Other Ambulatory Visit (HOSPITAL_COMMUNITY): Payer: Self-pay

## 2021-01-09 ENCOUNTER — Other Ambulatory Visit (HOSPITAL_COMMUNITY): Payer: Self-pay

## 2021-01-09 ENCOUNTER — Telehealth: Payer: Self-pay | Admitting: Pharmacy Technician

## 2021-01-09 ENCOUNTER — Encounter: Payer: Self-pay | Admitting: Internal Medicine

## 2021-01-09 NOTE — Telephone Encounter (Signed)
Oral Oncology Patient Advocate Encounter  Was successful in securing patient a $8000 grant from [employer] to provide copayment coverage for Venclexta.  This will keep the out of pocket expense at $0.     [employer] ID: [id number]  I have spoken with the patient.   The billing information is as follows and has been shared with [place].    RxBin: [id number] PCN: [id number] Member ID: [id number] Group ID: [id number] Dates of Eligibility: 12/10/20 through 12/09/21  Fund:  [place] Patient [place] Phone [phone] Fax [phone] 01/09/2021 10:37 AM

## 2021-01-17 ENCOUNTER — Other Ambulatory Visit (HOSPITAL_COMMUNITY): Payer: Self-pay

## 2021-01-24 ENCOUNTER — Telehealth: Payer: Self-pay | Admitting: Internal Medicine

## 2021-01-24 NOTE — Telephone Encounter (Signed)
Pt called to have her labs changed from 1-9 to same day that she see MD on 1-11. Call back at [phone]

## 2021-01-30 ENCOUNTER — Inpatient Hospital Stay: Payer: Medicare Other | Attending: Internal Medicine

## 2021-01-30 ENCOUNTER — Ambulatory Visit
Admission: RE | Admit: 2021-01-30 | Discharge: 2021-01-30 | Disposition: A | Discharge status: Home / Self Care | Payer: Medicare Other | Source: Ambulatory Visit | Attending: Internal Medicine | Admitting: Internal Medicine

## 2021-01-30 ENCOUNTER — Other Ambulatory Visit: Payer: Self-pay

## 2021-01-30 DIAGNOSIS — M7989 Other specified soft tissue disorders: Secondary | ICD-10-CM | POA: Insufficient documentation

## 2021-01-30 DIAGNOSIS — E039 Hypothyroidism, unspecified: Secondary | ICD-10-CM | POA: Diagnosis not present

## 2021-01-30 DIAGNOSIS — Z79899 Other long term (current) drug therapy: Secondary | ICD-10-CM | POA: Insufficient documentation

## 2021-01-30 DIAGNOSIS — C8308 Small cell B-cell lymphoma, lymph nodes of multiple sites: Secondary | ICD-10-CM | POA: Diagnosis present

## 2021-01-30 DIAGNOSIS — Z7901 Long term (current) use of anticoagulants: Secondary | ICD-10-CM | POA: Diagnosis not present

## 2021-01-30 DIAGNOSIS — D696 Thrombocytopenia, unspecified: Secondary | ICD-10-CM | POA: Diagnosis not present

## 2021-01-30 DIAGNOSIS — I4891 Unspecified atrial fibrillation: Secondary | ICD-10-CM | POA: Diagnosis not present

## 2021-01-30 LAB — CBC WITH DIFFERENTIAL/PLATELET
Abs Immature Granulocytes: 0.01 10*3/uL (ref 0.00–0.07)
Basophils Absolute: 0 10*3/uL (ref 0.0–0.1)
Basophils Relative: 1 %
Eosinophils Absolute: 0 10*3/uL (ref 0.0–0.5)
Eosinophils Relative: 0 %
HCT: 36.1 % (ref 36.0–46.0)
Hemoglobin: 12.6 g/dL (ref 12.0–15.0)
Immature Granulocytes: 0 %
Lymphocytes Relative: 20 %
Lymphs Abs: 0.8 10*3/uL (ref 0.7–4.0)
MCH: 33.7 pg (ref 26.0–34.0)
MCHC: 34.9 g/dL (ref 30.0–36.0)
MCV: 96.5 fL (ref 80.0–100.0)
Monocytes Absolute: 0.5 10*3/uL (ref 0.1–1.0)
Monocytes Relative: 13 %
Neutro Abs: 2.5 10*3/uL (ref 1.7–7.7)
Neutrophils Relative %: 66 %
Platelets: 91 10*3/uL — ABNORMAL LOW (ref 150–400)
RBC: 3.74 MIL/uL — ABNORMAL LOW (ref 3.87–5.11)
RDW: 13.9 % (ref 11.5–15.5)
WBC: 3.8 10*3/uL — ABNORMAL LOW (ref 4.0–10.5)
nRBC: 0 % (ref 0.0–0.2)

## 2021-01-30 LAB — COMPREHENSIVE METABOLIC PANEL
ALT: 22 U/L (ref 0–44)
AST: 31 U/L (ref 15–41)
Albumin: 4 g/dL (ref 3.5–5.0)
Alkaline Phosphatase: 71 U/L (ref 38–126)
Anion gap: 8 (ref 5–15)
BUN: 19 mg/dL (ref 8–23)
CO2: 28 mmol/L (ref 22–32)
Calcium: 9.3 mg/dL (ref 8.9–10.3)
Chloride: 95 mmol/L — ABNORMAL LOW (ref 98–111)
Creatinine, Ser: 1.04 mg/dL — ABNORMAL HIGH (ref 0.44–1.00)
GFR, Estimated: 54 mL/min — ABNORMAL LOW (ref >60)
Glucose, Bld: 96 mg/dL (ref 70–99)
Potassium: 3.6 mmol/L (ref 3.5–5.1)
Sodium: 131 mmol/L — ABNORMAL LOW (ref 135–145)
Total Bilirubin: 0.8 mg/dL (ref 0.3–1.2)
Total Protein: 6.9 g/dL (ref 6.5–8.1)

## 2021-01-30 LAB — LACTATE DEHYDROGENASE: LDH: 157 U/L (ref 98–192)

## 2021-01-30 MED ORDER — IOHEXOL 300 MG/ML  SOLN
85.0000 mL | Freq: Once | INTRAMUSCULAR | Status: AC | PRN
  Administered 2021-01-30: 85 mL via INTRAVENOUS

## 2021-02-01 ENCOUNTER — Inpatient Hospital Stay (HOSPITAL_BASED_OUTPATIENT_CLINIC_OR_DEPARTMENT_OTHER): Payer: Medicare Other | Admitting: Internal Medicine

## 2021-02-01 ENCOUNTER — Encounter: Payer: Self-pay | Admitting: Internal Medicine

## 2021-02-01 ENCOUNTER — Other Ambulatory Visit: Payer: Medicare Other

## 2021-02-01 ENCOUNTER — Other Ambulatory Visit: Payer: Self-pay

## 2021-02-01 DIAGNOSIS — C8308 Small cell B-cell lymphoma, lymph nodes of multiple sites: Secondary | ICD-10-CM

## 2021-02-01 NOTE — Assessment & Plan Note (Addendum)
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 JAN 2023- no findings suggestive of active lymphoma- STABLE; see incidental findings   #Continue venetoclax at current dose.  Tolerating well without any major side effects-except platelets in 70=-90s.   # Thrombocytopenia-99;  likely secondary to venetoclax/CLL-se below [anti-coagulation; Xarelto 20 mg a day] -STABLE  #Hypothyroidism-TSH July 2022-0.9; Synthroid reduced to 50; check TSH today.  # A. Fib-stable continue Xarelto [Dr.Kowalski].STABLE  #Bilateral lower extremity swelling-on Lasix-improved defer to Dr.Gilbert re: continued lasix.   #Incidental findings on CT  Imaging dated: JAN, 2023-renal cysts; possible liver cirrhosis/no evidence of decompensation; atherosclerosis; left adnexal cyst; buttock likely benign neurogenic nodule;  reviewed/discussed/counseled the patient.  # DISPOSITION:  # follow up in 3 months-MD: labs- cbc/cmp/ldh;Thyroid profile- Dr.B  # I reviewed the blood work- with the patient in detail; also reviewed the imaging independently [as summarized above]; and with the patient in detail.

## 2021-02-01 NOTE — Progress Notes (Signed)
[facility] OFFICE PROGRESS NOTE  Patient Care Team: [name], MD as PCP - General (Unknown Physician Specialty) Bary Castilla, Forest Gleason, MD (General Surgery) Corey Skains, MD as Consulting Physician (Cardiology) Anell Barr, OD as Consulting Physician (Optometry) Cammie Sickle, MD as Consulting Physician (Internal Medicine)   Cancer Staging  Small cell B-cell lymphoma of lymph nodes of multiple sites [facility]) Staging form: Hodgkin and Non-Hodgkin Lymphoma, AJCC 7th Edition - Clinical: Stage IV (B - Symptoms) - Signed by Cammie Sickle, MD on 02/03/2020 Stage prefix: Recurrence Biopsy of metastatic site performed: Yes Source of metastatic specimen: Axillary Lymph Nodes    Oncology History Overview Note  1. Lymphoma low-grade, status Rituxan therapy. 2. Recurrent disease by clinical examination in December of 2012 3. Ovarian mass on PET scan in December of 2012 (normal CA 125). Ovarian mass has resolved after rituximab therapy. 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  Patient had total 5 cycles of chemotherapy with bendamustine and rituximab and 6 cycle was omitted because of significant side effects with and weakness and fatigue.  # OCT 2016- PET significant response; ON surveillance  # 18th OCT 2018- [place];SEP 2018-/OCT 2018 CT scan- progression; s/p 6 cycles [#6 in April 2019]  # JAN 2020-CT scan shows progressive disease; start a acalabrutinib 100mg  BID;# feb 6th2020- decrease dose of acalbrutinib to 100 mg/day [sec to spon bruising] March 1st week- STOPPED [place] sec to spon brusing  # MARCH 17th 2020- Start Venetoclax [out pt ramp up]; 300 mg a day   # 11/06/2016- colonoscopy [Dr.Byrnett- 2 polyps]  #History of A.  Fib-Xarelto; CKD stage II-III.   # #Right posterior neck melanoma stage I [ Dr.Lee/Elkins]..   -------------------------------------------------------------------    # left ? Adnexal mass- ? Etiology; mild SUV uptake incidental [stable compared to previous PET in 2016] Previous workup 2015- vaginal ultrasound negative. MRI February 2018- approximately 3 cm in size; slow increase in the size of the left adnexal mass over at least 3 years- suspect involvement of lymphoma rather than a primary gynecologic malignancy. --------------------------------------------------------------   DIAGNOSIS: SMALL LYMPHOCYTIC LEUKEMIA  STAGE:  IV    ;GOALS: control/pallaitive  CURRENT/MOST RECENT THERAPY: Venatoclax.    Small cell B-cell lymphoma of lymph nodes of multiple sites [facility])  02/02/2020 Cancer Staging   Staging form: Hodgkin and Non-Hodgkin Lymphoma, AJCC 7th Edition - Clinical: Stage IV (B - Symptoms) - Signed by Cammie Sickle, MD on 02/03/2020        INTERVAL HISTORY: Walks independently; accompanied by husband.  Jacqueline Webb 81 y.o.  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  Patient denies any new lumps or bumps.  Denies any nausea vomiting abdominal pain.  Swelling improved on Lasix.  No diarrhea no bleeding.  Review of Systems  Constitutional:  Positive for malaise/fatigue and weight loss. Negative for chills, diaphoresis and fever.  HENT:  Negative for nosebleeds and sore throat.   Eyes:  Negative for double vision.  Respiratory:  Negative for cough, hemoptysis, sputum production, shortness of breath and wheezing.   Cardiovascular:  Positive for leg swelling. Negative for chest pain, palpitations and orthopnea.  Gastrointestinal:  Negative for abdominal pain, blood in stool, constipation, diarrhea, heartburn, melena and vomiting.  Genitourinary:  Negative for dysuria, frequency and  urgency.  Musculoskeletal:  Positive for back pain and joint pain.  Neurological:  Negative for dizziness, tingling, focal weakness, weakness and headaches.  Psychiatric/Behavioral:  Negative for depression. The patient is not nervous/anxious and does not have insomnia.    PAST MEDICAL HISTORY :  Past Medical History:  Diagnosis Date   A-fib [facility])    Benign neoplasm of rectum and anal canal    Chronic leukemia of unspecified cell type, without mention of having achieved remission    Dysrhythmia    A-Fib   Hypercholesteremia    Hypertension    Hyperthyroidism    Lymphoma ([place]) 2007   Non Hodgkin's lymphoma ([place]) 01/22/2006   Personal history of chemotherapy     PAST SURGICAL HISTORY :   Past Surgical History:  Procedure Laterality Date   ABDOMINAL HYSTERECTOMY  1958   APPENDECTOMY     BREAST BIOPSY Right 1970   right breast biopsy with clip- neg   BREAST BIOPSY Left 2002   left breast biopsy with clip-neg   BREAST BIOPSY Right 2014   right breast biopsy with clip-neg   BREAST CYST ASPIRATION Bilateral 2000   bilateral fine needle aspiration   BREAST SURGERY Left 1990   biopsy   BREAST SURGERY Right May 08, 2012   complex fibroadenoma without malignancy.   CARDIAC CATHETERIZATION  2014   COLONOSCOPY  2009, 2012    hyperplastic rectal polyp 2012 tubular adenoma of proximal ascending colon 2000 9 repeat exam due to 2017.   COLONOSCOPY WITH PROPOFOL N/A 11/06/2016   Procedure: COLONOSCOPY WITH PROPOFOL;  Surgeon: Robert Bellow, MD;  Location: [facility] ENDOSCOPY;  Service: Endoscopy;  Laterality: N/A;   COLONOSCOPY WITH PROPOFOL N/A 02/27/2019   Procedure: COLONOSCOPY WITH PROPOFOL;  Surgeon: Robert Bellow, MD;  Location: [facility] ENDOSCOPY;  Service: General;  Laterality: N/A;   CORONARY ARTERY BYPASS GRAFT  1999   FLEXIBLE SIGMOIDOSCOPY  1998    FAMILY HISTORY :   Family History  Problem Relation Age of Onset   Lung cancer Father        Died age 49   Heart disease  Mother        Died age 72   Asthma Mother    CAD Brother        double bypass   Heart attack Sister    Tuberculosis Sister        54 years old   Lung cancer Sister        Died in her 31s   Angina Sister    Breast cancer Sister 73   Rheum arthritis Sister    Aneurysm Sister        Brain, died age 17   Angina Sister    Heart disease Brother        Stent placement   Heart disease Brother        stent placement   Cancer Other        breast cancer niece, pancreatic cancer niece   Breast cancer Other    Ovarian cancer Neg Hx    Diabetes Neg Hx     SOCIAL HISTORY:   Social History   Tobacco Use   Smoking status: Never   Smokeless tobacco: Never  Vaping Use   Vaping Use: Never used  Substance Use Topics   Alcohol use: Yes    Alcohol/week: 0.0 - 7.0 standard drinks    Comment: 1 drink a day of wine or cocktail  Drug use: No    ALLERGIES:  is allergic to pravastatin.  MEDICATIONS:  Current Outpatient Medications  Medication Sig Dispense Refill   Docusate Calcium (STOOL SOFTENER PO) Take by mouth as needed.      ezetimibe (ZETIA) 10 MG tablet Take 1 tablet (10 mg total) daily by mouth. 30 tablet 12   furosemide (LASIX) 20 MG tablet Take 1 tablet (20 mg total) by mouth every morning. 30 tablet 5   levothyroxine (SYNTHROID) 50 MCG tablet TAKE 1 TABLET(50 MCG) BY MOUTH DAILY BEFORE AND BREAKFAST 90 tablet 3   lisinopril-hydrochlorothiazide (ZESTORETIC) 20-12.5 MG tablet TAKE 1 TABLET BY MOUTH  DAILY 90 tablet 0   melatonin 5 MG TABS Take 5 mg by mouth.     metoprolol succinate (TOPROL-XL) 25 MG 24 hr tablet Take 25 mg by mouth daily.      ondansetron (ZOFRAN) 4 MG tablet Take 1 tablet (4 mg total) by mouth every 8 (eight) hours as needed for nausea or vomiting. 40 tablet 3   oxybutynin (DITROPAN) 5 MG tablet TAKE 1 TABLET BY MOUTH  TWICE DAILY 180 tablet 3   potassium chloride (KLOR-CON) 10 MEQ tablet Take 1 tablet (10 mEq total) by mouth daily. Take Two Times Daily When  Taking Furosemide 30 tablet 5   rosuvastatin (CRESTOR) 5 MG tablet Take 5 mg by mouth daily. Takes 3 times a week     venetoclax (VENCLEXTA) 100 MG tablet TAKE 3 TABLETS BY MOUTH DAILY 90 tablet 2   XARELTO 20 MG TABS tablet Take 20 mg by mouth daily.     isosorbide mononitrate (IMDUR) 30 MG 24 hr tablet Take 30 mg by mouth daily.  (Patient not taking: Reported on 11/02/2020)     nitrofurantoin, macrocrystal-monohydrate, (MACROBID) 100 MG capsule Take 1 capsule (100 mg total) by mouth 2 (two) times daily. (Patient not taking: Reported on 02/01/2021) 10 capsule 5   No current facility-administered medications for this visit.   Facility-Administered Medications Ordered in Other Visits  Medication Dose Route Frequency Provider Last Rate Last Admin   heparin lock flush 100 unit/mL  500 Units Intravenous Once Charlaine Dalton R, MD       sodium chloride flush (NS) 0.9 % injection 10 mL  10 mL Intravenous PRN Cammie Sickle, MD        PHYSICAL EXAMINATION: ECOG PERFORMANCE STATUS: 1 - Symptomatic but completely ambulatory  BP 107/63    Pulse 73    Temp (!) 97.4 F (36.3 C)    Resp 16    Wt 132 lb 9.6 oz (60.1 kg)    BMI 22.76 kg/m   Filed Weights   02/01/21 1021  Weight: 132 lb 9.6 oz (60.1 kg)   Bilateral lower extremity swelling.  Physical Exam HENT:     Head: Normocephalic and atraumatic.     Mouth/Throat:     Pharynx: No oropharyngeal exudate.  Eyes:     Pupils: Pupils are equal, round, and reactive to light.  Cardiovascular:     Rate and Rhythm: Normal rate and regular rhythm.  Pulmonary:     Effort: No respiratory distress.     Breath sounds: No wheezing.  Abdominal:     General: Bowel sounds are normal. There is no distension.     Palpations: Abdomen is soft. There is no mass.     Tenderness: There is no abdominal tenderness. There is no guarding or rebound.  Musculoskeletal:        General: No tenderness. Normal range of  motion.     Cervical back: Normal range  of motion and neck supple.  Skin:    General: Skin is warm.  Neurological:     Mental Status: She is alert and oriented to person, place, and time.  Psychiatric:        Mood and Affect: Affect normal.    LABORATORY DATA:  I have reviewed the data as listed    Component Value Date/Time   NA 131 (L) 01/30/2021 0947   NA 139 07/03/2017 1425   NA 141 05/11/2014 0853   K 3.6 01/30/2021 0947   K 3.6 05/11/2014 0853   CL 95 (L) 01/30/2021 0947   CL 103 05/11/2014 0853   CO2 28 01/30/2021 0947   CO2 29 05/11/2014 0853   GLUCOSE 96 01/30/2021 0947   GLUCOSE 107 (H) 05/11/2014 0853   BUN 19 01/30/2021 0947   BUN 19 07/03/2017 1425   BUN 22 (H) 05/11/2014 0853   CREATININE 1.04 (H) 01/30/2021 0947   CREATININE 1.07 (H) 05/11/2014 0853   CALCIUM 9.3 01/30/2021 0947   CALCIUM 10.1 05/11/2014 0853   PROT 6.9 01/30/2021 0947   PROT 6.4 07/03/2017 1425   PROT 7.3 05/11/2014 0853   ALBUMIN 4.0 01/30/2021 0947   ALBUMIN 4.3 07/03/2017 1425   ALBUMIN 4.7 05/11/2014 0853   AST 31 01/30/2021 0947   AST 29 05/11/2014 0853   ALT 22 01/30/2021 0947   ALT 20 05/11/2014 0853   ALKPHOS 71 01/30/2021 0947   ALKPHOS 47 05/11/2014 0853   BILITOT 0.8 01/30/2021 0947   BILITOT 0.5 07/03/2017 1425   BILITOT 1.0 05/11/2014 0853   GFRNONAA 54 (L) 01/30/2021 0947   GFRNONAA 51 (L) 05/11/2014 0853   GFRAA >60 08/05/2019 0941   GFRAA 59 (L) 05/11/2014 0853    No results found for: SPEP, UPEP  Lab Results  Component Value Date   WBC 3.8 (L) 01/30/2021   NEUTROABS 2.5 01/30/2021   HGB 12.6 01/30/2021   HCT 36.1 01/30/2021   MCV 96.5 01/30/2021   PLT 91 (L) 01/30/2021      Chemistry      Component Value Date/Time   NA 131 (L) 01/30/2021 0947   NA 139 07/03/2017 1425   NA 141 05/11/2014 0853   K 3.6 01/30/2021 0947   K 3.6 05/11/2014 0853   CL 95 (L) 01/30/2021 0947   CL 103 05/11/2014 0853   CO2 28 01/30/2021 0947   CO2 29 05/11/2014 0853   BUN 19 01/30/2021 0947   BUN 19  07/03/2017 1425   BUN 22 (H) 05/11/2014 0853   CREATININE 1.04 (H) 01/30/2021 0947   CREATININE 1.07 (H) 05/11/2014 0853   GLU 98 05/25/2013 0000      Component Value Date/Time   CALCIUM 9.3 01/30/2021 0947   CALCIUM 10.1 05/11/2014 0853   ALKPHOS 71 01/30/2021 0947   ALKPHOS 47 05/11/2014 0853   AST 31 01/30/2021 0947   AST 29 05/11/2014 0853   ALT 22 01/30/2021 0947   ALT 20 05/11/2014 0853   BILITOT 0.8 01/30/2021 0947   BILITOT 0.5 07/03/2017 1425   BILITOT 1.0 05/11/2014 0853       RADIOGRAPHIC STUDIES: I have personally reviewed the radiological images as listed and agreed with the findings in the report. CT CHEST ABDOMEN PELVIS W CONTRAST  Result Date: 01/30/2021 CLINICAL DATA:  History of recurrent SLL/non-Hodgkin's lymphoma with ongoing chemotherapy. Restaging. EXAM: CT CHEST, ABDOMEN, AND PELVIS WITH CONTRAST TECHNIQUE: Multidetector CT imaging of the chest,  abdomen and pelvis was performed following the standard protocol during bolus administration of intravenous contrast. CONTRAST:  67mL OMNIPAQUE IOHEXOL 300 MG/ML  SOLN COMPARISON:  05/02/2020 CT chest, abdomen and pelvis. FINDINGS: CT CHEST FINDINGS Cardiovascular: Stable mild cardiomegaly. No significant pericardial effusion/thickening. Three-vessel coronary atherosclerosis status post CABG. Atherosclerotic nonaneurysmal thoracic aorta. Normal caliber pulmonary arteries. No central pulmonary emboli. Mediastinum/Nodes: No discrete thyroid nodules. Small amount of oral contrast layers in the mid to lower thoracic esophagus suggesting mild esophageal dysmotility and/or gastroesophageal reflux. No pathologically enlarged axillary, mediastinal or hilar lymph nodes. Lungs/Pleura: No pneumothorax. No pleural effusion. No acute consolidative airspace disease or lung masses. Scattered indistinct ground-glass nodules in both lungs, largest 0.4 cm in the right lower lobe (series 3/image 87), all stable, favoring chronic mild inflammatory  change. No new significant pulmonary nodules. Musculoskeletal: No aggressive appearing focal osseous lesions. Intact sternotomy wires. Moderate thoracic spondylosis. CT ABDOMEN PELVIS FINDINGS Hepatobiliary: Normal liver size. Simple lateral segment left liver cysts, largest 1.7 cm, not appreciably changed. Simple central right liver 1.8 cm cyst, similar. No new liver lesions. Liver surface appears finely irregular, cannot exclude cirrhosis. Normal gallbladder with no radiopaque cholelithiasis. No biliary ductal dilatation. Pancreas: Normal, with no mass or duct dilation. Spleen: Normal size. No mass. Adrenals/Urinary Tract: Normal adrenals. Hypodense 1.3 cm anterior interpolar right renal cortical lesion (series 2/image 81), stable. Hypodense 1.6 cm posterior upper left renal cortical lesion (series 2/image 61), stable. Additional smaller hypodense bilateral renal cortical lesions are too small to characterize and are not appreciably changed. No hydronephrosis. Normal bladder. Stomach/Bowel: Normal non-distended stomach. Normal caliber small bowel with no small bowel wall thickening. Appendectomy. Oral contrast transits to the colon. Normal large bowel with no diverticulosis, large bowel wall thickening or pericolonic fat stranding. Vascular/Lymphatic: Atherosclerotic nonaneurysmal abdominal aorta. Patent portal, splenic, hepatic and renal veins. No pathologically enlarged lymph nodes in the abdomen or pelvis. Reproductive: Status post hysterectomy, with no abnormal findings at the vaginal cuff. No right adnexal mass. Solid left adnexal 4.3 x 3.8 cm mass (series 2/image 104), previously 4.6 x 3.6 cm, not appreciably changed. Other: No pneumoperitoneum, ascites or focal fluid collection. Musculoskeletal: No aggressive appearing focal osseous lesions. Lumbar degenerative disc disease, severe at L5-S1. Stable small 1.8 cm ovoid hypodense mass along the posterior margin of the right gluteus medius muscle (series  2/image 93), previously described as likely benign neurogenic neoplasm. IMPRESSION: 1. No lymphadenopathy or other findings of recurrent lymphoma in the chest, abdomen or pelvis. Normal size spleen. 2. Chronic solid left adnexal 4.3 cm mass is stable. 3. Liver surface appears finely irregular, cannot exclude cirrhosis. 4. Small hypodense indeterminate bilateral renal cortical lesions are stable. 5. Aortic Atherosclerosis (ICD10-I70.0). Electronically Signed   By: Ilona Sorrel M.D.   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 JAN 2023- no findings suggestive of active lymphoma- STABLE; see incidental findings   #Continue venetoclax at current dose.  Tolerating well without any major side effects-except platelets in 70=-90s.   # Thrombocytopenia-99;  likely secondary to venetoclax/CLL-se below [anti-coagulation; Xarelto 20 mg a day] -STABLE  #Hypothyroidism-TSH July 2022-0.9; Synthroid reduced to 50; check TSH today.  # A. Fib-stable continue Xarelto [Dr.Kowalski].STABLE  #Bilateral lower extremity swelling-on Lasix-improved defer to Dr.Gilbert re: continued lasix.   #Incidental findings on CT  Imaging dated: JAN, 2023-renal cysts;  possible liver cirrhosis/no evidence of decompensation; atherosclerosis; left adnexal cyst; buttock likely benign neurogenic nodule;  reviewed/discussed/counseled the patient.  # DISPOSITION:  # follow up in 3 months-MD: labs- cbc/cmp/ldh;Thyroid profile- Dr.B  # I reviewed the blood work- with the patient in detail; also reviewed the imaging independently [as summarized above]; and with the patient in detail.       Orders Placed This Encounter  Procedures   CBC with Differential/Platelet    Standing Status:   Future    Standing Expiration Date:   02/01/2022   Comprehensive metabolic panel     Standing Status:   Future    Standing Expiration Date:   02/01/2022   Lactate dehydrogenase    Standing Status:   Future    Standing Expiration Date:   02/01/2022   Thyroid Panel With TSH    Standing Status:   Future    Standing Expiration Date:   02/01/2022    All questions were answered. The patient knows to call the clinic with any problems, questions or concerns.      Cammie Sickle, MD 02/01/2021 10:54 AM

## 2021-02-01 NOTE — Progress Notes (Signed)
Patient denies new problems/concerns today.   °

## 2021-02-06 ENCOUNTER — Other Ambulatory Visit (HOSPITAL_COMMUNITY): Payer: Self-pay

## 2021-02-07 ENCOUNTER — Other Ambulatory Visit (HOSPITAL_COMMUNITY): Payer: Self-pay

## 2021-02-08 ENCOUNTER — Other Ambulatory Visit (HOSPITAL_COMMUNITY): Payer: Self-pay

## 2021-02-24 ENCOUNTER — Other Ambulatory Visit (HOSPITAL_COMMUNITY): Payer: Self-pay

## 2021-02-28 ENCOUNTER — Other Ambulatory Visit (HOSPITAL_COMMUNITY): Payer: Self-pay

## 2021-03-02 ENCOUNTER — Other Ambulatory Visit (HOSPITAL_COMMUNITY): Payer: Self-pay

## 2021-03-03 ENCOUNTER — Other Ambulatory Visit (HOSPITAL_COMMUNITY): Payer: Self-pay

## 2021-03-08 ENCOUNTER — Other Ambulatory Visit (HOSPITAL_COMMUNITY): Payer: Self-pay

## 2021-03-08 ENCOUNTER — Ambulatory Visit: Payer: Medicare Other | Admitting: Family Medicine

## 2021-03-09 ENCOUNTER — Other Ambulatory Visit (HOSPITAL_COMMUNITY): Payer: Self-pay

## 2021-03-22 ENCOUNTER — Other Ambulatory Visit: Payer: Self-pay | Admitting: Family Medicine

## 2021-03-22 DIAGNOSIS — Z1231 Encounter for screening mammogram for malignant neoplasm of breast: Secondary | ICD-10-CM

## 2021-03-23 ENCOUNTER — Ambulatory Visit (INDEPENDENT_AMBULATORY_CARE_PROVIDER_SITE_OTHER): Payer: Medicare Other

## 2021-03-23 DIAGNOSIS — Z0000 Encounter for general adult medical examination without abnormal findings: Secondary | ICD-10-CM | POA: Diagnosis not present

## 2021-03-23 NOTE — Patient Instructions (Signed)
Jacqueline Webb , Thank you for taking time to come for your Medicare Wellness Visit. I appreciate your ongoing commitment to your health goals. Please review the following plan we discussed and let me know if I can assist you in the future.   Screening recommendations/referrals: Colonoscopy: aged out Mammogram: aged out Bone Density: n/d Recommended yearly ophthalmology/optometry visit for glaucoma screening and checkup Recommended yearly dental visit for hygiene and checkup  Vaccinations: Influenza vaccine: 11/02/20 Pneumococcal vaccine: 12/31/13 Tdap vaccine: 05/26/18 Shingles vaccine: n/d   Covid-19:02/10/19, 03/10/19, 11/04/19  Advanced directives: no  Conditions/risks identified: none   Next appointment: Follow up in one year for your annual wellness visit 03/27/22 @ 9am by phone   Preventive Care 84 Years and Older, Female Preventive care refers to lifestyle choices and visits with your health care provider that can promote health and wellness. What does preventive care include? A yearly physical exam. This is also called an annual well check. Dental exams once or twice a year. Routine eye exams. Ask your health care provider how often you should have your eyes checked. Personal lifestyle choices, including: Daily care of your teeth and gums. Regular physical activity. Eating a healthy diet. Avoiding tobacco and drug use. Limiting alcohol use. Practicing safe sex. Taking low-dose aspirin every day. Taking vitamin and mineral supplements as recommended by your health care provider. What happens during an annual well check? The services and screenings done by your health care provider during your annual well check will depend on your age, overall health, lifestyle risk factors, and family history of disease. Counseling  Your health care provider may ask you questions about your: Alcohol use. Tobacco use. Drug use. Emotional well-being. Home and relationship  well-being. Sexual activity. Eating habits. History of falls. Memory and ability to understand (cognition). Work and work Statistician. Reproductive health. Screening  You may have the following tests or measurements: Height, weight, and BMI. Blood pressure. Lipid and cholesterol levels. These may be checked every 5 years, or more frequently if you are over 52 years old. Skin check. Lung cancer screening. You may have this screening every year starting at age 43 if you have a 30-pack-year history of smoking and currently smoke or have quit within the past 15 years. Fecal occult blood test (FOBT) of the stool. You may have this test every year starting at age 59. Flexible sigmoidoscopy or colonoscopy. You may have a sigmoidoscopy every 5 years or a colonoscopy every 10 years starting at age 57. Hepatitis C blood test. Hepatitis B blood test. Sexually transmitted disease (STD) testing. Diabetes screening. This is done by checking your blood sugar (glucose) after you have not eaten for a while (fasting). You may have this done every 1-3 years. Bone density scan. This is done to screen for osteoporosis. You may have this done starting at age 46. Mammogram. This may be done every 1-2 years. Talk to your health care provider about how often you should have regular mammograms. Talk with your health care provider about your test results, treatment options, and if necessary, the need for more tests. Vaccines  Your health care provider may recommend certain vaccines, such as: Influenza vaccine. This is recommended every year. Tetanus, diphtheria, and acellular pertussis (Tdap, Td) vaccine. You may need a Td booster every 10 years. Zoster vaccine. You may need this after age 65. Pneumococcal 13-valent conjugate (PCV13) vaccine. One dose is recommended after age 72. Pneumococcal polysaccharide (PPSV23) vaccine. One dose is recommended after age 38. Talk to your  health care provider about which  screenings and vaccines you need and how often you need them. This information is not intended to replace advice given to you by your health care provider. Make sure you discuss any questions you have with your health care provider. Document Released: 02/04/2015 Document Revised: 09/28/2015 Document Reviewed: 11/09/2014 Elsevier Interactive Patient Education  2017 [place] Prevention in the Home Falls can cause injuries. They can happen to people of all ages. There are many things you can do to make your home safe and to help prevent falls. What can I do on the outside of my home? Regularly fix the edges of walkways and driveways and fix any cracks. Remove anything that might make you trip as you walk through a door, such as a raised step or threshold. Trim any bushes or trees on the path to your home. Use bright outdoor lighting. Clear any walking paths of anything that might make someone trip, such as rocks or tools. Regularly check to see if handrails are loose or broken. Make sure that both sides of any steps have handrails. Any raised decks and porches should have guardrails on the edges. Have any leaves, snow, or ice cleared regularly. Use sand or salt on walking paths during winter. Clean up any spills in your garage right away. This includes oil or grease spills. What can I do in the bathroom? Use night lights. Install grab bars by the toilet and in the tub and shower. Do not use towel bars as grab bars. Use non-skid mats or decals in the tub or shower. If you need to sit down in the shower, use a plastic, non-slip stool. Keep the floor dry. Clean up any water that spills on the floor as soon as it happens. Remove soap buildup in the tub or shower regularly. Attach bath mats securely with double-sided non-slip rug tape. Do not have throw rugs and other things on the floor that can make you trip. What can I do in the bedroom? Use night lights. Make sure that you have a  light by your bed that is easy to reach. Do not use any sheets or blankets that are too big for your bed. They should not hang down onto the floor. Have a firm chair that has side arms. You can use this for support while you get dressed. Do not have throw rugs and other things on the floor that can make you trip. What can I do in the kitchen? Clean up any spills right away. Avoid walking on wet floors. Keep items that you use a lot in easy-to-reach places. If you need to reach something above you, use a strong step stool that has a grab bar. Keep electrical cords out of the way. Do not use floor polish or wax that makes floors slippery. If you must use wax, use non-skid floor wax. Do not have throw rugs and other things on the floor that can make you trip. What can I do with my stairs? Do not leave any items on the stairs. Make sure that there are handrails on both sides of the stairs and use them. Fix handrails that are broken or loose. Make sure that handrails are as long as the stairways. Check any carpeting to make sure that it is firmly attached to the stairs. Fix any carpet that is loose or worn. Avoid having throw rugs at the top or bottom of the stairs. If you do have throw rugs, attach them  to the floor with carpet tape. Make sure that you have a light switch at the top of the stairs and the bottom of the stairs. If you do not have them, ask someone to add them for you. What else can I do to help prevent falls? Wear shoes that: Do not have high heels. Have rubber bottoms. Are comfortable and fit you well. Are closed at the toe. Do not wear sandals. If you use a stepladder: Make sure that it is fully opened. Do not climb a closed stepladder. Make sure that both sides of the stepladder are locked into place. Ask someone to hold it for you, if possible. Clearly mark and make sure that you can see: Any grab bars or handrails. First and last steps. Where the edge of each step  is. Use tools that help you move around (mobility aids) if they are needed. These include: Canes. Walkers. Scooters. Crutches. Turn on the lights when you go into a dark area. Replace any light bulbs as soon as they burn out. Set up your furniture so you have a clear path. Avoid moving your furniture around. If any of your floors are uneven, fix them. If there are any pets around you, be aware of where they are. Review your medicines with your doctor. Some medicines can make you feel dizzy. This can increase your chance of falling. Ask your doctor what other things that you can do to help prevent falls. This information is not intended to replace advice given to you by your health care provider. Make sure you discuss any questions you have with your health care provider. Document Released: 11/04/2008 Document Revised: 06/16/2015 Document Reviewed: 02/12/2014 Elsevier Interactive Patient Education  2017 [employer].

## 2021-03-23 NOTE — Progress Notes (Signed)
Virtual Visit via Telephone Note  I connected with  Jacqueline Webb on 03/23/21 at  9:00 AM EST by telephone and verified that I am speaking with the correct person using two identifiers.  Location: Patient: home Provider: [facility] Persons participating in the virtual visit: [place]   I discussed the limitations, risks, security and privacy concerns of performing an evaluation and management service by telephone and the availability of in person appointments. The patient expressed understanding and agreed to proceed.  Interactive audio and video telecommunications were attempted between this nurse and patient, however failed, due to patient having technical difficulties OR patient did not have access to video capability.  We continued and completed visit with audio only.  Some vital signs may be absent or patient reported.   Dionisio David, LPN  Subjective:   Jacqueline Webb is a 81 y.o. female who presents for Medicare Annual (Subsequent) preventive examination.  Review of Systems           Objective:    There were no vitals filed for this visit. There is no height or weight on file to calculate BMI.  Advanced Directives 02/01/2021 11/02/2020 08/03/2020 05/04/2020 02/02/2020 09/22/2019 02/27/2019  Does Patient Have a Medical Advance Directive? No No No No No No No  Type of Advance Directive - - - - - - -  Does patient want to make changes to medical advance directive? - - - - - - -  Copy of [place] in Chart? - - - - - - -  Would patient like information on creating a medical advance directive? No - Patient declined No - Patient declined No - Patient declined No - Patient declined No - Patient declined No - Patient declined -    Current Medications (verified) Outpatient Encounter Medications as of 03/23/2021  Medication Sig   Docusate Calcium (STOOL SOFTENER PO) Take by mouth as needed.    ezetimibe (ZETIA) 10 MG tablet Take 1 tablet (10  mg total) daily by mouth.   furosemide (LASIX) 20 MG tablet Take 1 tablet (20 mg total) by mouth every morning.   isosorbide mononitrate (IMDUR) 30 MG 24 hr tablet Take 30 mg by mouth daily.  (Patient not taking: Reported on 11/02/2020)   levothyroxine (SYNTHROID) 50 MCG tablet TAKE 1 TABLET(50 MCG) BY MOUTH DAILY BEFORE AND BREAKFAST   lisinopril-hydrochlorothiazide (ZESTORETIC) 20-12.5 MG tablet TAKE 1 TABLET BY MOUTH  DAILY   melatonin 5 MG TABS Take 5 mg by mouth.   metoprolol succinate (TOPROL-XL) 25 MG 24 hr tablet Take 25 mg by mouth daily.    nitrofurantoin, macrocrystal-monohydrate, (MACROBID) 100 MG capsule Take 1 capsule (100 mg total) by mouth 2 (two) times daily. (Patient not taking: Reported on 02/01/2021)   ondansetron (ZOFRAN) 4 MG tablet Take 1 tablet (4 mg total) by mouth every 8 (eight) hours as needed for nausea or vomiting.   oxybutynin (DITROPAN) 5 MG tablet TAKE 1 TABLET BY MOUTH  TWICE DAILY   potassium chloride (KLOR-CON) 10 MEQ tablet Take 1 tablet (10 mEq total) by mouth daily. Take Two Times Daily When Taking Furosemide   rosuvastatin (CRESTOR) 5 MG tablet Take 5 mg by mouth daily. Takes 3 times a week   venetoclax (VENCLEXTA) 100 MG tablet TAKE 3 TABLETS BY MOUTH DAILY   XARELTO 20 MG TABS tablet Take 20 mg by mouth daily.   Facility-Administered Encounter Medications as of 03/23/2021  Medication   heparin lock flush 100 unit/mL  sodium chloride flush (NS) 0.9 % injection 10 mL    Allergies (verified) Pravastatin   History: Past Medical History:  Diagnosis Date   A-fib ([place])    Benign neoplasm of rectum and anal canal    Chronic leukemia of unspecified cell type, without mention of having achieved remission    Dysrhythmia    A-Fib   Hypercholesteremia    Hypertension    Hyperthyroidism    Lymphoma ([place]) 2007   Non Hodgkin's lymphoma ([place]) 01/22/2006   Personal history of chemotherapy    Past Surgical History:  Procedure Laterality Date   ABDOMINAL  HYSTERECTOMY  1958   APPENDECTOMY     BREAST BIOPSY Right 1970   right breast biopsy with clip- neg   BREAST BIOPSY Left 2002   left breast biopsy with clip-neg   BREAST BIOPSY Right 2014   right breast biopsy with clip-neg   BREAST CYST ASPIRATION Bilateral 2000   bilateral fine needle aspiration   BREAST SURGERY Left 1990   biopsy   BREAST SURGERY Right May 08, 2012   complex fibroadenoma without malignancy.   CARDIAC CATHETERIZATION  2014   COLONOSCOPY  2009, 2012    hyperplastic rectal polyp 2012 tubular adenoma of proximal ascending colon 2000 9 repeat exam due to 2017.   COLONOSCOPY WITH PROPOFOL N/A 11/06/2016   Procedure: COLONOSCOPY WITH PROPOFOL;  Surgeon: Robert Bellow, MD;  Location: [facility] ENDOSCOPY;  Service: Endoscopy;  Laterality: N/A;   COLONOSCOPY WITH PROPOFOL N/A 02/27/2019   Procedure: COLONOSCOPY WITH PROPOFOL;  Surgeon: Robert Bellow, MD;  Location: [facility] ENDOSCOPY;  Service: General;  Laterality: N/A;   [place]   Family History  Problem Relation Age of Onset   Lung cancer Father        Died age 23   Heart disease Mother        Died age 76   Asthma Mother    CAD Brother        double bypass   Heart attack Sister    Tuberculosis Sister        18 years old   Lung cancer Sister        Died in her 62s   Angina Sister    Breast cancer Sister 68   Rheum arthritis Sister    Aneurysm Sister        Brain, died age 34   Angina Sister    Heart disease Brother        Stent placement   Heart disease Brother        stent placement   Cancer Other        breast cancer niece, pancreatic cancer niece   Breast cancer Other    Ovarian cancer Neg Hx    Diabetes Neg Hx    Social History   Socioeconomic History   Marital status: Married    Spouse name: Not on file   Number of children: 2   Years of education: In [place]   Highest education level: 10th grade  Occupational History    Occupation: retired  Tobacco Use   Smoking status: Never   Smokeless tobacco: Never  Vaping Use   Vaping Use: Never used  Substance and Sexual Activity   Alcohol use: Yes    Alcohol/week: 0.0 - 7.0 standard drinks    Comment: 1 drink a day of wine or cocktail   Drug use: No   Sexual activity: Never  Other Topics Concern   Not on file  Social History Narrative   Not on file   Social Determinants of Health   Financial Resource Strain: Not on file  Food Insecurity: Not on file  Transportation Needs: Not on file  Physical Activity: Not on file  Stress: Not on file  Social Connections: Not on file    Tobacco Counseling Counseling given: Not Answered   Clinical Intake:  Pre-visit preparation completed: Yes  Pain : No/denies pain     Nutritional Risks: None Diabetes: No  How often do you need to have someone help you when you read instructions, pamphlets, or other written materials from your doctor or pharmacy?: 1 - Never  Diabetic?no  Interpreter Needed?: No  Information entered by :: Kirke Shaggy, LPN   Activities of Daily Living In your present state of health, do you have any difficulty performing the following activities: 05/04/2020  Hearing? N  Vision? N  Difficulty concentrating or making decisions? N  Walking or climbing stairs? N  Dressing or bathing? N  Doing errands, shopping? N  Some recent data might be hidden    Patient Care Team: [name], MD as PCP - General (Unknown Physician Specialty) Bary Castilla, Forest Gleason, MD (General Surgery) Corey Skains, MD as Consulting Physician (Cardiology) Anell Barr, OD as Consulting Physician (Optometry) Cammie Sickle, MD as Consulting Physician (Internal Medicine)  Indicate any recent Medical Services you may have received from other than [facility] providers in the past year (date may be approximate).     Assessment:   This is a routine wellness examination for  [place].  Hearing/Vision screen No results found.  Dietary issues and exercise activities discussed:     Goals Addressed   None    Depression Screen PHQ 2/9 Scores 05/04/2020 11/03/2019 09/22/2019 09/03/2018 12/03/2016 11/17/2015 09/28/2014  PHQ - 2 Score 0 0 0 0 0 0 0  PHQ- 9 Score 0 2 - - 0 - -    Fall Risk Fall Risk  05/04/2020 11/03/2019 09/22/2019 09/03/2018 05/02/2017  Falls in the past year? 0 0 0 0 No  Number falls in past yr: 0 0 0 - -  Injury with Fall? 0 0 0 - -  Follow up Falls evaluation completed Falls evaluation completed - - -    FALL RISK PREVENTION PERTAINING TO THE HOME:  Any stairs in or around the home? Yes  If so, are there any without handrails? No  Home free of loose throw rugs in walkways, pet beds, electrical cords, etc? Yes  Adequate lighting in your home to reduce risk of falls? Yes   ASSISTIVE DEVICES UTILIZED TO PREVENT FALLS:  Life alert? No  Use of a cane, walker or w/c? No  Grab bars in the bathroom? Yes  Shower chair or bench in shower? Yes  Elevated toilet seat or a handicapped toilet? Yes     Cognitive Function:    6CIT Screen 11/17/2015  What Year? 0 points  What month? 0 points  What time? 0 points  Count back from 20 0 points  Months in reverse 0 points  Repeat phrase 0 points  Total Score 0    Immunizations Immunization History  Administered Date(s) Administered   Fluad Quad(high Dose 65+) 12/01/2018, 01/11/2020, 11/02/2020   Influenza, High Dose Seasonal PF 12/10/2014, 11/17/2015, 10/03/2016, 01/06/2018   Moderna Sars-Covid-2 Vaccination 02/10/2019, 03/10/2019, 11/04/2019   Pneumococcal Conjugate-13 12/31/2013   Pneumococcal Polysaccharide-23 11/21/2010   Td 10/09/2007, 05/26/2018  TDAP status: Up to date  Flu Vaccine status: Up to date  Pneumococcal vaccine status: Up to date  Covid-19 vaccine status: Completed vaccines  Qualifies for Shingles Vaccine? Yes   Zostavax completed No   Shingrix Completed?: No.     Education has been provided regarding the importance of this vaccine. Patient has been advised to call insurance company to determine out of pocket expense if they have not yet received this vaccine. Advised may also receive vaccine at local pharmacy or Health Dept. Verbalized acceptance and understanding.  Screening Tests Health Maintenance  Topic Date Due   Zoster Vaccines- Shingrix (1 of 2) Never done   COVID-19 Vaccine (4 - Booster for Moderna series) 12/30/2019   DEXA SCAN  11/16/2025 (Originally 03/23/2008)   TETANUS/TDAP  05/25/2028   Pneumonia Vaccine 75+ Years old  Completed   INFLUENZA VACCINE  Completed   HPV VACCINES  Aged Out    Health Maintenance  Health Maintenance Due  Topic Date Due   Zoster Vaccines- Shingrix (1 of 2) Never done   COVID-19 Vaccine (4 - Booster for Moderna series) 12/30/2019    Colorectal cancer screening: No longer required.   Mammogram status: Completed 03/24/20. Repeat every year- aged out    Lung Cancer Screening: (Low Dose CT Chest recommended if Age 74-80 years, 30 pack-year currently smoking OR have quit w/in 15years.) does not qualify.    Additional Screening:  Hepatitis C Screening: does not qualify; Completed no  Vision Screening: Recommended annual ophthalmology exams for early detection of glaucoma and other disorders of the eye. Is the patient up to date with their annual eye exam?  Yes  Who is the provider or what is the name of the office in which the patient attends annual eye exams? Dr. Ellin Mayhew If pt is not established with a provider, would they like to be referred to a provider to establish care? No .   Dental Screening: Recommended annual dental exams for proper oral hygiene  Community Resource Referral / Chronic Care Management: CRR required this visit?  No   CCM required this visit?  No      Plan:     I have personally reviewed and noted the following in the patient's chart:   Medical and social history Use of  alcohol, tobacco or illicit drugs  Current medications and supplements including opioid prescriptions.  Functional ability and status Nutritional status Physical activity Advanced directives List of other physicians Hospitalizations, surgeries, and ER visits in previous 12 months Vitals Screenings to include cognitive, depression, and falls Referrals and appointments  In addition, I have reviewed and discussed with patient certain preventive protocols, quality metrics, and best practice recommendations. A written personalized care plan for preventive services as well as general preventive health recommendations were provided to patient.     Dionisio David, LPN   05/27/2128   Nurse Notes: none

## 2021-03-26 ENCOUNTER — Other Ambulatory Visit: Payer: Self-pay | Admitting: Family Medicine

## 2021-04-03 ENCOUNTER — Other Ambulatory Visit: Payer: Self-pay | Admitting: Nurse Practitioner

## 2021-04-03 ENCOUNTER — Other Ambulatory Visit (HOSPITAL_COMMUNITY): Payer: Self-pay

## 2021-04-03 DIAGNOSIS — C8308 Small cell B-cell lymphoma, lymph nodes of multiple sites: Secondary | ICD-10-CM

## 2021-04-04 ENCOUNTER — Other Ambulatory Visit (HOSPITAL_COMMUNITY): Payer: Self-pay

## 2021-04-05 ENCOUNTER — Other Ambulatory Visit (HOSPITAL_COMMUNITY): Payer: Self-pay

## 2021-04-10 ENCOUNTER — Other Ambulatory Visit (HOSPITAL_COMMUNITY): Payer: Self-pay

## 2021-04-12 ENCOUNTER — Other Ambulatory Visit: Payer: Self-pay | Admitting: Nurse Practitioner

## 2021-04-12 ENCOUNTER — Other Ambulatory Visit (HOSPITAL_COMMUNITY): Payer: Self-pay

## 2021-04-12 DIAGNOSIS — C8308 Small cell B-cell lymphoma, lymph nodes of multiple sites: Secondary | ICD-10-CM

## 2021-04-14 ENCOUNTER — Other Ambulatory Visit: Payer: Self-pay | Admitting: Nurse Practitioner

## 2021-04-14 ENCOUNTER — Encounter: Payer: Self-pay | Admitting: Internal Medicine

## 2021-04-14 ENCOUNTER — Other Ambulatory Visit (HOSPITAL_COMMUNITY): Payer: Self-pay

## 2021-04-14 DIAGNOSIS — C8308 Small cell B-cell lymphoma, lymph nodes of multiple sites: Secondary | ICD-10-CM

## 2021-04-14 MED ORDER — VENETOCLAX 100 MG PO TABS
ORAL_TABLET | Freq: Every day | ORAL | 2 refills | Status: DC
  Filled 2021-04-14: qty 90, 30d supply, fill #0
  Filled 2021-05-04: qty 90, 30d supply, fill #1
  Filled 2021-06-07: qty 90, 30d supply, fill #2

## 2021-04-17 ENCOUNTER — Other Ambulatory Visit (HOSPITAL_COMMUNITY): Payer: Self-pay

## 2021-04-26 ENCOUNTER — Ambulatory Visit: Payer: Medicare Other | Admitting: Family Medicine

## 2021-05-02 ENCOUNTER — Other Ambulatory Visit (HOSPITAL_COMMUNITY): Payer: Self-pay

## 2021-05-03 ENCOUNTER — Inpatient Hospital Stay (HOSPITAL_BASED_OUTPATIENT_CLINIC_OR_DEPARTMENT_OTHER): Payer: Medicare Other | Admitting: Internal Medicine

## 2021-05-03 ENCOUNTER — Encounter: Payer: Self-pay | Admitting: Internal Medicine

## 2021-05-03 ENCOUNTER — Inpatient Hospital Stay: Payer: Medicare Other | Attending: Internal Medicine

## 2021-05-03 DIAGNOSIS — D696 Thrombocytopenia, unspecified: Secondary | ICD-10-CM | POA: Diagnosis not present

## 2021-05-03 DIAGNOSIS — Z9221 Personal history of antineoplastic chemotherapy: Secondary | ICD-10-CM | POA: Insufficient documentation

## 2021-05-03 DIAGNOSIS — C8308 Small cell B-cell lymphoma, lymph nodes of multiple sites: Secondary | ICD-10-CM

## 2021-05-03 DIAGNOSIS — I4891 Unspecified atrial fibrillation: Secondary | ICD-10-CM | POA: Diagnosis not present

## 2021-05-03 DIAGNOSIS — Z79899 Other long term (current) drug therapy: Secondary | ICD-10-CM | POA: Diagnosis not present

## 2021-05-03 DIAGNOSIS — Z85820 Personal history of malignant melanoma of skin: Secondary | ICD-10-CM | POA: Insufficient documentation

## 2021-05-03 DIAGNOSIS — M7989 Other specified soft tissue disorders: Secondary | ICD-10-CM | POA: Insufficient documentation

## 2021-05-03 DIAGNOSIS — Z7901 Long term (current) use of anticoagulants: Secondary | ICD-10-CM | POA: Diagnosis not present

## 2021-05-03 LAB — CBC WITH DIFFERENTIAL/PLATELET
Abs Immature Granulocytes: 0.01 10*3/uL (ref 0.00–0.07)
Basophils Absolute: 0 10*3/uL (ref 0.0–0.1)
Basophils Relative: 1 %
Eosinophils Absolute: 0 10*3/uL (ref 0.0–0.5)
Eosinophils Relative: 0 %
HCT: 36.8 % (ref 36.0–46.0)
Hemoglobin: 12.7 g/dL (ref 12.0–15.0)
Immature Granulocytes: 0 %
Lymphocytes Relative: 28 %
Lymphs Abs: 0.9 10*3/uL (ref 0.7–4.0)
MCH: 31.1 pg (ref 26.0–34.0)
MCHC: 34.5 g/dL (ref 30.0–36.0)
MCV: 90 fL (ref 80.0–100.0)
Monocytes Absolute: 0.4 10*3/uL (ref 0.1–1.0)
Monocytes Relative: 13 %
Neutro Abs: 1.7 10*3/uL (ref 1.7–7.7)
Neutrophils Relative %: 58 %
Platelets: 70 10*3/uL — ABNORMAL LOW (ref 150–400)
RBC: 4.09 MIL/uL (ref 3.87–5.11)
RDW: 13.9 % (ref 11.5–15.5)
WBC: 3 10*3/uL — ABNORMAL LOW (ref 4.0–10.5)
nRBC: 0 % (ref 0.0–0.2)

## 2021-05-03 LAB — COMPREHENSIVE METABOLIC PANEL
ALT: 25 U/L (ref 0–44)
AST: 33 U/L (ref 15–41)
Albumin: 3.9 g/dL (ref 3.5–5.0)
Alkaline Phosphatase: 65 U/L (ref 38–126)
Anion gap: 8 (ref 5–15)
BUN: 14 mg/dL (ref 8–23)
CO2: 26 mmol/L (ref 22–32)
Calcium: 9.5 mg/dL (ref 8.9–10.3)
Chloride: 100 mmol/L (ref 98–111)
Creatinine, Ser: 0.91 mg/dL (ref 0.44–1.00)
GFR, Estimated: >60 mL/min (ref >60)
Glucose, Bld: 104 mg/dL — ABNORMAL HIGH (ref 70–99)
Potassium: 3.5 mmol/L (ref 3.5–5.1)
Sodium: 134 mmol/L — ABNORMAL LOW (ref 135–145)
Total Bilirubin: 1.2 mg/dL (ref 0.3–1.2)
Total Protein: 6.5 g/dL (ref 6.5–8.1)

## 2021-05-03 LAB — LACTATE DEHYDROGENASE: LDH: 146 U/L (ref 98–192)

## 2021-05-03 NOTE — Progress Notes (Signed)
[name] ?OFFICE PROGRESS NOTE ? ?Patient Care Team: ?[name], MD as PCP - General (Unknown Physician Specialty) ?Robert Bellow, MD (General Surgery) ?Corey Skains, MD as Consulting Physician (Cardiology) ?Anell Barr, OD as Consulting Physician (Optometry) ?Cammie Sickle, MD as Consulting Physician (Internal Medicine) ? ? 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 ? ? ?Oncology History Overview Note  ?1. Lymphoma low-grade, status Rituxan therapy. ?2. Recurrent disease by clinical examination in December of 2012 ?3. Ovarian mass on PET scan in December of 2012 (normal CA 125). Ovarian mass has resolved after rituximab therapy. ?4.repeat PET scan dated  March, 2016 shows progressive disease ?5.biopsies consistent with small lymphocytic lymphoma (March, 2016) ?6, patient started on bendamustine and Rituxan because of progressive disease and symptomatic disease (May 11, 2014) ?7.  Patient had total 5 cycles of chemotherapy with bendamustine and rituximab and 6 cycle was omitted because of significant side effects with and weakness and fatigue. ? ?# OCT 2016- PET significant response; ON surveillance ? ?# 18th OCT 2018- [place];SEP 2018-/OCT 2018 CT scan- progression; s/p 6 cycles [#6 in April 2019] ? ?# JAN 2020-CT scan shows progressive disease; start a acalabrutinib '100mg'$  BID;# feb [id number]- decrease dose of acalbrutinib to 100 mg/day [sec to spon bruising] March 1st week- STOPPED [place] sec to spon brusing ? ?# MARCH 17th 2020- Start Venetoclax [out pt ramp up]; 300 mg a day ? ? ?# 11/06/2016- colonoscopy [Dr.Byrnett- 2 polyps] ? ?#History of A.  Fib-Xarelto; CKD stage II-III.  ? ?# #Right posterior neck melanoma stage I [ Dr.Lee/[place]]..  ? ?-------------------------------------------------------------------   ? ?# left ? Adnexal mass- ? Etiology; mild SUV uptake incidental [stable compared to previous PET in 2016] Previous workup 2015- vaginal ultrasound negative. MRI February 2018- approximately 3 cm in size; slow increase in the size of the left adnexal mass over at least 3 years- suspect involvement of lymphoma rather than a primary gynecologic malignancy. ?--------------------------------------------------------------  ? ?DIAGNOSIS: SMALL LYMPHOCYTIC LEUKEMIA ? ?STAGE:  IV    ;GOALS: control/pallaitive ? ?CURRENT/MOST RECENT THERAPY: ?Venatoclax.  ?  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 Staging form: Hodgkin and Non-Hodgkin Lymphoma, AJCC 7th Edition ?- Clinical: Stage IV (B - Symptoms) - Signed by Cammie Sickle, MD on 02/03/2020 ? ?  ? ?  ? ?INTERVAL HISTORY: Walks independently; accompanied by husband. ? ?IYAUNA Webb 81 y.o.  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 ? ?Patient has not had any new lumps or bumps.  No abdominal pain nausea vomiting.  Chronic mild lower extremity swelling on Lasix as needed. No diarrhea no bleeding. ? ?Review of Systems  ?Constitutional:  Positive for malaise/fatigue and weight loss. Negative for chills, diaphoresis and fever.  ?HENT:  Negative for nosebleeds and sore throat.   ?Eyes:  Negative for double vision.  ?Respiratory:  Negative for cough, hemoptysis, sputum production, shortness of breath and wheezing.   ?Cardiovascular:  Positive for leg swelling. Negative for chest pain, palpitations and orthopnea.  ?Gastrointestinal:  Negative for abdominal pain, blood in stool, constipation, diarrhea, heartburn, melena and vomiting.  ?Genitourinary:  Negative for dysuria, frequency and urgency.   ?Musculoskeletal:  Positive for back pain and joint pain.  ?Neurological:  Negative for dizziness, tingling, focal weakness, weakness and headaches.  ?Psychiatric/Behavioral:  Negative for depression. The patient is not nervous/anxious and does not have insomnia.   ? ?PAST MEDICAL HISTORY :  ?Past Medical History:  ?Diagnosis Date  ? A-fib ([place])   ? Benign neoplasm of rectum and anal canal   ? Chronic leukemia of unspecified cell type, without mention of having achieved remission   ? Dysrhythmia   ? A-Fib  ? Hypercholesteremia   ? Hypertension   ? Hyperthyroidism   ? Lymphoma ([place]) 2007  ? Non Hodgkin's lymphoma ([place]) 01/22/2006  ? Personal history of chemotherapy   ? ? ?PAST SURGICAL HISTORY :   ?Past Surgical History:  ?Procedure Laterality Date  ? ABDOMINAL HYSTERECTOMY  1958  ? APPENDECTOMY    ? BREAST BIOPSY Right 1970  ? right breast biopsy with clip- neg  ? BREAST BIOPSY Left 2002  ? left breast biopsy with clip-neg  ? BREAST BIOPSY Right 2014  ? right breast biopsy with clip-neg  ? BREAST CYST ASPIRATION Bilateral 2000  ? bilateral fine needle aspiration  ? BREAST SURGERY Left 1990  ? biopsy  ? BREAST SURGERY Right May 08, 2012  ? complex fibroadenoma without malignancy.  ? CARDIAC CATHETERIZATION  2014  ? COLONOSCOPY  2009, 2012  ?  hyperplastic rectal polyp 2012 tubular adenoma of proximal ascending colon 2000 9 repeat exam due to 2017.  ? COLONOSCOPY WITH PROPOFOL N/A 11/06/2016  ? Procedure: COLONOSCOPY WITH PROPOFOL;  Surgeon: Robert Bellow, MD;  Location: [facility] ENDOSCOPY;  Service: Endoscopy;  Laterality: N/A;  ? COLONOSCOPY WITH PROPOFOL N/A 02/27/2019  ? Procedure: COLONOSCOPY WITH PROPOFOL;  Surgeon: Robert Bellow, MD;  Location: [facility] ENDOSCOPY;  Service: General;  Laterality: N/A;  ? [place]  ? [place]  ? ? ?FAMILY HISTORY :   ?Family History  ?Problem Relation Age of Onset  ? Lung cancer Father   ?     Died age 54  ? Heart disease Mother    ?     Died age 70  ? Asthma Mother   ? CAD Brother   ?     double bypass  ? Heart attack Sister   ? Tuberculosis Sister   ?     78 years old  ? Lung cancer Sister   ?     Died in her 60s  ? Angina Sister   ? Breast cancer Sister 77  ? Rheum arthritis Sister   ? Aneurysm Sister   ?     Brain, died age 65  ? Angina Sister   ? Heart disease Brother   ?     Stent placement  ? Heart disease Brother   ?     stent placement  ? Cancer Other   ?     breast cancer niece, pancreatic cancer niece  ? Breast cancer Other   ? Ovarian cancer Neg Hx   ? Diabetes Neg Hx   ? ? ?SOCIAL HISTORY:   ?Social History  ? ?Tobacco Use  ? Smoking status: Never  ? Smokeless tobacco: Never  ?Vaping Use  ? Vaping Use: Never used  ?Substance Use Topics  ? Alcohol use: Yes  ?  Alcohol/week: 0.0 - 7.0 standard drinks  ?  Comment: 1 drink a day of wine or cocktail  ?  Drug use: No  ? ? ?ALLERGIES:  is allergic to pravastatin. ? ?MEDICATIONS:  ?Current Outpatient Medications  ?Medication Sig Dispense Refill  ? ezetimibe (ZETIA) 10 MG tablet Take 1 tablet (10 mg total) daily by mouth. 30 tablet 12  ? furosemide (LASIX) 20 MG tablet Take 1 tablet (20 mg total) by mouth every morning. (Patient taking differently: Take 20 mg by mouth daily as needed.) 30 tablet 5  ? isosorbide mononitrate (IMDUR) 30 MG 24 hr tablet Take 30 mg by mouth daily.    ? KLOR-CON M10 10 MEQ tablet Take 10 mEq by mouth 2 (two) times daily as needed. Take with Furosemide    ? levothyroxine (SYNTHROID) 50 MCG tablet TAKE 1 TABLET(50 MCG) BY MOUTH DAILY BEFORE AND BREAKFAST 90 tablet 3  ? lisinopril-hydrochlorothiazide (ZESTORETIC) 20-12.5 MG tablet TAKE 1 TABLET BY MOUTH  DAILY 90 tablet 3  ? melatonin 5 MG TABS Take 5 mg by mouth.    ? metoprolol succinate (TOPROL-XL) 25 MG 24 hr tablet Take 25 mg by mouth daily.     ? ondansetron (ZOFRAN) 4 MG tablet Take 1 tablet (4 mg total) by mouth every 8 (eight) hours as needed for nausea or vomiting. 40 tablet 3  ? oxybutynin (DITROPAN) 5  MG tablet TAKE 1 TABLET BY MOUTH  TWICE DAILY 180 tablet 3  ? rosuvastatin (CRESTOR) 5 MG tablet Take 5 mg by mouth daily. Takes 3 times a week    ? venetoclax (VENCLEXTA) 100 MG tablet TAKE 3 TABLETS BY MOUT

## 2021-05-03 NOTE — Assessment & Plan Note (Addendum)
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 JAN 2023- no findings suggestive of active lymphoma- STABLE; see incidental findings  ? ?#Continue venetoclax at current dose. Again discussed duration fixed vs. Indefinite. Continue indefinite for now.  Sherald Hess Venclexta well without any major side effects-except platelets in 70.  Discussed Venclexta dose reduction if platelets drop further/ bleeding.  ?  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  ?# A. Fib-stable continue Xarelto [Dr.Kowalski].STABLE ? ?#Bilateral lower extremity swelling-on Lasix-improved defer to Dr.Gilbert re: continued lasix.  ? ?# DISPOSITION:  ?# follow up in 3 months-MD: labs- cbc/cmp/ldh;Thyroid profile- Dr.B ? ? ? ?

## 2021-05-03 NOTE — Progress Notes (Signed)
p 

## 2021-05-04 ENCOUNTER — Other Ambulatory Visit (HOSPITAL_COMMUNITY): Payer: Self-pay

## 2021-05-04 LAB — THYROID PANEL WITH TSH
Free Thyroxine Index: 2.5 (ref 1.2–4.9)
T3 Uptake Ratio: 30 % (ref 24–39)
T4, Total: 8.2 ug/dL (ref 4.5–12.0)
TSH: 0.469 u[IU]/mL (ref 0.450–4.500)

## 2021-05-09 ENCOUNTER — Other Ambulatory Visit (HOSPITAL_COMMUNITY): Payer: Self-pay

## 2021-05-10 ENCOUNTER — Ambulatory Visit: Payer: Medicare Other | Admitting: Family Medicine

## 2021-06-06 ENCOUNTER — Ambulatory Visit
Admission: RE | Admit: 2021-06-06 | Discharge: 2021-06-06 | Disposition: A | Discharge status: Home / Self Care | Payer: Medicare Other | Source: Ambulatory Visit | Attending: Family Medicine | Admitting: Family Medicine

## 2021-06-06 ENCOUNTER — Encounter: Payer: Self-pay | Admitting: Family Medicine

## 2021-06-06 ENCOUNTER — Other Ambulatory Visit (HOSPITAL_COMMUNITY): Payer: Self-pay

## 2021-06-06 ENCOUNTER — Ambulatory Visit (INDEPENDENT_AMBULATORY_CARE_PROVIDER_SITE_OTHER): Payer: Medicare Other | Admitting: Family Medicine

## 2021-06-06 VITALS — BP 119/76 | HR 73 | Resp 16 | Wt 135.7 lb

## 2021-06-06 DIAGNOSIS — Z1231 Encounter for screening mammogram for malignant neoplasm of breast: Secondary | ICD-10-CM | POA: Insufficient documentation

## 2021-06-06 DIAGNOSIS — I25810 Atherosclerosis of coronary artery bypass graft(s) without angina pectoris: Secondary | ICD-10-CM

## 2021-06-06 DIAGNOSIS — E039 Hypothyroidism, unspecified: Secondary | ICD-10-CM | POA: Diagnosis not present

## 2021-06-06 DIAGNOSIS — I10 Essential (primary) hypertension: Secondary | ICD-10-CM

## 2021-06-06 DIAGNOSIS — Z853 Personal history of malignant neoplasm of breast: Secondary | ICD-10-CM

## 2021-06-06 DIAGNOSIS — C8308 Small cell B-cell lymphoma, lymph nodes of multiple sites: Secondary | ICD-10-CM | POA: Diagnosis not present

## 2021-06-06 NOTE — Progress Notes (Signed)
Established patient visit   Patient: Jacqueline Webb   DOB: 06-03-1940   81 y.o. Female  MRN: [id number] Visit Date: 06/06/2021  Today's healthcare provider: [name], MD   Clint Bolder as a scribe for [name], MD.,have documented all relevant documentation on the behalf of [name], MD,as directed by  [name], MD while in the presence of [name], MD.  Chief Complaint  Patient presents with   Hypothyroidism   Hyperlipidemia   Subjective    HPI  Patient is here for 6 month follow up for chronic problems.  She is doing well and her medical problems are stable.  She continues to be treated by oncology for her lymphoma.. She lives in the [place] with her husband.  He is in failing health and this is taking a toll on both of them. Hypothyroid, follow-up  Lab Results  Component Value Date   TSH 0.469 05/03/2021   TSH 0.190 (L) 08/03/2020   TSH 0.765 11/04/2019   T4TOTAL 8.2 05/03/2021    Wt Readings from Last 3 Encounters:  06/06/21 135 lb 11.2 oz (61.6 kg)  05/03/21 132 lb 9.6 oz (60.1 kg)  02/01/21 132 lb 9.6 oz (60.1 kg)    She was last seen for hypothyroid 7 months ago.  Management since that visit includes none.Patient states since visit she increased her medication back to 38mg.  She reports excellent compliance with treatment. She is not having side effects.   Symptoms: No change in energy level No constipation  No diarrhea No heat / cold intolerance  No nervousness No palpitations  No weight changes    -----------------------------------------------------------------------------------------  Lipid/Cholesterol, Follow-up  Last lipid panel Other pertinent labs  Lab Results  Component Value Date   CHOL 165 11/04/2019   HDL 87 11/04/2019   LDLCALC 68 11/04/2019   TRIG 50 11/04/2019   CHOLHDL 1.9 11/04/2019   Lab Results  Component Value Date   ALT 25 05/03/2021   AST 33  05/03/2021   PLT 70 (L) 05/03/2021   TSH 0.469 05/03/2021     She was last seen for this 7 months ago.  Management since that visit includes none.  She reports excellent compliance with treatment. She is not having side effects.   Symptoms: No chest pain No chest pressure/discomfort  No dyspnea Yes lower extremity edema  No numbness or tingling of extremity No orthopnea  No palpitations No paroxysmal nocturnal dyspnea  No speech difficulty No syncope   Current diet: well balanced, on average, 2-3 meals per day Current exercise: not asked  The ASCVD Risk score (Arnett DK, et al., 2019) failed to calculate for the following reasons:   The 2019 ASCVD risk score is only valid for ages 438to 769 ---------------------------------------------------------------------------------------------------  Medications: Outpatient Medications Prior to Visit  Medication Sig Note   ezetimibe (ZETIA) 10 MG tablet Take 1 tablet (10 mg total) daily by mouth.    furosemide (LASIX) 20 MG tablet Take 1 tablet (20 mg total) by mouth every morning. (Patient taking differently: Take 20 mg by mouth daily as needed.)    isosorbide mononitrate (IMDUR) 30 MG 24 hr tablet Take 30 mg by mouth daily.    KLOR-CON M10 10 MEQ tablet Take 10 mEq by mouth 2 (two) times daily as needed. Take with Furosemide    levothyroxine (SYNTHROID) 50 MCG tablet TAKE 1 TABLET(50 MCG) BY MOUTH DAILY BEFORE AND BREAKFAST 06/06/2021: Patient reports  that she is taking 42mg   lisinopril-hydrochlorothiazide (ZESTORETIC) 20-12.5 MG tablet TAKE 1 TABLET BY MOUTH  DAILY    melatonin 5 MG TABS Take 5 mg by mouth.    metoprolol succinate (TOPROL-XL) 25 MG 24 hr tablet Take 25 mg by mouth daily.     oxybutynin (DITROPAN) 5 MG tablet TAKE 1 TABLET BY MOUTH  TWICE DAILY    potassium chloride (KLOR-CON) 10 MEQ tablet Take 1 tablet (10 mEq total) by mouth daily. Take Two Times Daily When Taking Furosemide    rosuvastatin (CRESTOR) 5 MG tablet Take  5 mg by mouth daily. Takes 3 times a week    venetoclax (VENCLEXTA) 100 MG tablet TAKE 3 TABLETS BY MOUTH DAILY    XARELTO 20 MG TABS tablet Take 20 mg by mouth daily.    Docusate Calcium (STOOL SOFTENER PO) Take by mouth as needed.  (Patient not taking: Reported on 03/23/2021)    nitrofurantoin, macrocrystal-monohydrate, (MACROBID) 100 MG capsule Take 1 capsule (100 mg total) by mouth 2 (two) times daily. (Patient not taking: Reported on 06/06/2021)    ondansetron (ZOFRAN) 4 MG tablet Take 1 tablet (4 mg total) by mouth every 8 (eight) hours as needed for nausea or vomiting. (Patient not taking: Reported on 06/06/2021)    Facility-Administered Medications Prior to Visit  Medication Dose Route Frequency Provider   heparin lock flush 100 unit/mL  500 Units Intravenous Once BCharlaine DaltonR, MD   sodium chloride flush (NS) 0.9 % injection 10 mL  10 mL Intravenous PRN BCammie Sickle MD    Review of Systems  Constitutional:  Negative for appetite change, chills, fatigue and fever.  Respiratory:  Negative for chest tightness and shortness of breath.   Cardiovascular:  Negative for chest pain and palpitations.  Gastrointestinal:  Negative for abdominal pain, nausea and vomiting.  Neurological:  Negative for dizziness and weakness.   Last CBC Lab Results  Component Value Date   WBC 3.0 (L) 05/03/2021   HGB 12.7 05/03/2021   HCT 36.8 05/03/2021   MCV 90.0 05/03/2021   MCH 31.1 05/03/2021   RDW 13.9 05/03/2021   PLT 70 (L) 05/03/2021       Objective    BP 119/76   Pulse 73   Resp 16   Wt 135 lb 11.2 oz (61.6 kg)   SpO2 95%   BMI 23.29 kg/m  BP Readings from Last 3 Encounters:  06/06/21 119/76  05/03/21 115/73  02/01/21 107/63   Wt Readings from Last 3 Encounters:  06/06/21 135 lb 11.2 oz (61.6 kg)  05/03/21 132 lb 9.6 oz (60.1 kg)  02/01/21 132 lb 9.6 oz (60.1 kg)      Physical Exam Vitals reviewed.  Constitutional:      Appearance: She is well-developed.   HENT:     Head: Normocephalic and atraumatic.     Right Ear: External ear normal.     Left Ear: External ear normal.     Nose: Nose normal.  Eyes:     General: No scleral icterus.    Conjunctiva/sclera: Conjunctivae normal.  Neck:     Thyroid: No thyromegaly.  Cardiovascular:     Rate and Rhythm: Normal rate and regular rhythm.     Heart sounds: Normal heart sounds.  Pulmonary:     Effort: Pulmonary effort is normal.     Breath sounds: Normal breath sounds.  Abdominal:     Palpations: Abdomen is soft.  Musculoskeletal:     Right lower leg: Edema  present.     Left lower leg: Edema present.  Skin:    General: Skin is warm and dry.  Neurological:     General: No focal deficit present.     Mental Status: She is alert and oriented to person, place, and time.  Psychiatric:        Mood and Affect: Mood normal.        Behavior: Behavior normal.        Thought Content: Thought content normal.        Judgment: Judgment normal.      No results found for any visits on 06/06/21.  Assessment & Plan     1. Essential (primary) hypertension Good control.  2. Coronary artery disease involving coronary bypass graft of native heart without angina pectoris Risk factors treated.  3. Adult hypothyroidism EuThyroid  4. 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 Personal history of malignant neoplasm of breast       I, [name], MD, have reviewed all documentation for this visit. The documentation on 06/10/21 for the exam, diagnosis, procedures, and orders are all accurate and complete.   No follow-ups on file.      I, [name], MD, have reviewed all documentation for this visit. The documentation on 06/10/21 for the exam, diagnosis, procedures, and orders are all accurate and complete.    Sherol Sabas Cranford Mon, MD  [facility] [phone] (phone) [phone] (fax)  [place]

## 2021-06-07 ENCOUNTER — Other Ambulatory Visit (HOSPITAL_COMMUNITY): Payer: Self-pay

## 2021-07-03 ENCOUNTER — Other Ambulatory Visit (HOSPITAL_COMMUNITY): Payer: Self-pay

## 2021-07-04 ENCOUNTER — Other Ambulatory Visit (HOSPITAL_COMMUNITY): Payer: Self-pay

## 2021-07-04 ENCOUNTER — Other Ambulatory Visit: Payer: Self-pay | Admitting: Nurse Practitioner

## 2021-07-04 DIAGNOSIS — C8308 Small cell B-cell lymphoma, lymph nodes of multiple sites: Secondary | ICD-10-CM

## 2021-07-05 ENCOUNTER — Other Ambulatory Visit (HOSPITAL_COMMUNITY): Payer: Self-pay

## 2021-07-06 ENCOUNTER — Other Ambulatory Visit (HOSPITAL_COMMUNITY): Payer: Self-pay

## 2021-07-07 ENCOUNTER — Other Ambulatory Visit (HOSPITAL_COMMUNITY): Payer: Self-pay

## 2021-07-10 ENCOUNTER — Other Ambulatory Visit (HOSPITAL_COMMUNITY): Payer: Self-pay

## 2021-07-10 ENCOUNTER — Other Ambulatory Visit: Payer: Self-pay | Admitting: Nurse Practitioner

## 2021-07-10 DIAGNOSIS — C8308 Small cell B-cell lymphoma, lymph nodes of multiple sites: Secondary | ICD-10-CM

## 2021-07-13 ENCOUNTER — Other Ambulatory Visit: Payer: Self-pay | Admitting: Nurse Practitioner

## 2021-07-13 ENCOUNTER — Other Ambulatory Visit (HOSPITAL_COMMUNITY): Payer: Self-pay

## 2021-07-13 DIAGNOSIS — C8308 Small cell B-cell lymphoma, lymph nodes of multiple sites: Secondary | ICD-10-CM

## 2021-07-13 MED ORDER — VENETOCLAX 100 MG PO TABS
ORAL_TABLET | Freq: Every day | ORAL | 0 refills | Status: DC
  Filled 2021-07-13: qty 90, 30d supply, fill #0

## 2021-08-01 ENCOUNTER — Other Ambulatory Visit: Payer: Self-pay

## 2021-08-01 DIAGNOSIS — C8308 Small cell B-cell lymphoma, lymph nodes of multiple sites: Secondary | ICD-10-CM

## 2021-08-02 ENCOUNTER — Inpatient Hospital Stay (HOSPITAL_BASED_OUTPATIENT_CLINIC_OR_DEPARTMENT_OTHER): Payer: Medicare Other | Admitting: Internal Medicine

## 2021-08-02 ENCOUNTER — Encounter: Payer: Self-pay | Admitting: Internal Medicine

## 2021-08-02 ENCOUNTER — Inpatient Hospital Stay: Payer: Medicare Other | Attending: Internal Medicine

## 2021-08-02 DIAGNOSIS — D696 Thrombocytopenia, unspecified: Secondary | ICD-10-CM | POA: Diagnosis not present

## 2021-08-02 DIAGNOSIS — Z7901 Long term (current) use of anticoagulants: Secondary | ICD-10-CM | POA: Insufficient documentation

## 2021-08-02 DIAGNOSIS — C8308 Small cell B-cell lymphoma, lymph nodes of multiple sites: Secondary | ICD-10-CM

## 2021-08-02 DIAGNOSIS — I4891 Unspecified atrial fibrillation: Secondary | ICD-10-CM | POA: Diagnosis not present

## 2021-08-02 DIAGNOSIS — Z79899 Other long term (current) drug therapy: Secondary | ICD-10-CM | POA: Insufficient documentation

## 2021-08-02 DIAGNOSIS — Z8572 Personal history of non-Hodgkin lymphomas: Secondary | ICD-10-CM | POA: Insufficient documentation

## 2021-08-02 LAB — COMPREHENSIVE METABOLIC PANEL
ALT: 26 U/L (ref 0–44)
AST: 34 U/L (ref 15–41)
Albumin: 4.1 g/dL (ref 3.5–5.0)
Alkaline Phosphatase: 76 U/L (ref 38–126)
Anion gap: 8 (ref 5–15)
BUN: 21 mg/dL (ref 8–23)
CO2: 24 mmol/L (ref 22–32)
Calcium: 9.3 mg/dL (ref 8.9–10.3)
Chloride: 102 mmol/L (ref 98–111)
Creatinine, Ser: 0.96 mg/dL (ref 0.44–1.00)
GFR, Estimated: 59 mL/min — ABNORMAL LOW (ref >60)
Glucose, Bld: 116 mg/dL — ABNORMAL HIGH (ref 70–99)
Potassium: 3.7 mmol/L (ref 3.5–5.1)
Sodium: 134 mmol/L — ABNORMAL LOW (ref 135–145)
Total Bilirubin: 1 mg/dL (ref 0.3–1.2)
Total Protein: 6.9 g/dL (ref 6.5–8.1)

## 2021-08-02 LAB — CBC WITH DIFFERENTIAL/PLATELET
Abs Immature Granulocytes: 0.01 10*3/uL (ref 0.00–0.07)
Basophils Absolute: 0 10*3/uL (ref 0.0–0.1)
Basophils Relative: 1 %
Eosinophils Absolute: 0 10*3/uL (ref 0.0–0.5)
Eosinophils Relative: 1 %
HCT: 37 % (ref 36.0–46.0)
Hemoglobin: 12.5 g/dL (ref 12.0–15.0)
Immature Granulocytes: 0 %
Lymphocytes Relative: 24 %
Lymphs Abs: 0.7 10*3/uL (ref 0.7–4.0)
MCH: 30.9 pg (ref 26.0–34.0)
MCHC: 33.8 g/dL (ref 30.0–36.0)
MCV: 91.6 fL (ref 80.0–100.0)
Monocytes Absolute: 0.3 10*3/uL (ref 0.1–1.0)
Monocytes Relative: 11 %
Neutro Abs: 1.8 10*3/uL (ref 1.7–7.7)
Neutrophils Relative %: 63 %
Platelets: 68 10*3/uL — ABNORMAL LOW (ref 150–400)
RBC: 4.04 MIL/uL (ref 3.87–5.11)
RDW: 14.3 % (ref 11.5–15.5)
WBC: 2.9 10*3/uL — ABNORMAL LOW (ref 4.0–10.5)
nRBC: 0 % (ref 0.0–0.2)

## 2021-08-02 LAB — LACTATE DEHYDROGENASE: LDH: 149 U/L (ref 98–192)

## 2021-08-02 NOTE — Progress Notes (Signed)
Pt in for follow up, reports having a single fall 2 weeks ago in the shower.  States is still sore in her ribs in her back.

## 2021-08-02 NOTE — Progress Notes (Signed)
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 Lymphoma low-grade, status Rituxan therapy. 2. Recurrent disease by clinical examination in December of 2012 3. Ovarian mass on PET scan in December of 2012 (normal CA 125). Ovarian mass has resolved after rituximab therapy. 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  Patient had total 5 cycles of chemotherapy with bendamustine and rituximab and 6 cycle was omitted because of significant side effects with and weakness and fatigue.  # OCT 2016- PET significant response; ON surveillance  # 18th OCT 2018- [place];SEP 2018-/OCT 2018 CT scan- progression; s/p 6 cycles [#6 in April 2019]  # JAN 2020-CT scan shows progressive disease; start a acalabrutinib '100mg'$  BID;# feb 6th2020- decrease dose of acalbrutinib to 100 mg/day [sec to spon bruising] March 1st week- STOPPED [place] sec to spon brusing  # MARCH 17th 2020- Start Venetoclax [out pt ramp up]; 300 mg a day   # 11/06/2016- colonoscopy [Dr.Byrnett- 2 polyps]  #History of A.  Fib-Xarelto; CKD stage II-III.   # #Right posterior neck melanoma stage I [ Dr.Lee/Elkins]..   -------------------------------------------------------------------    # left ? Adnexal mass- ? Etiology; mild SUV uptake incidental [stable compared to previous PET in 2016] Previous workup 2015- vaginal ultrasound negative. MRI February 2018- approximately 3 cm in size; slow increase in the size of the left adnexal mass over at least 3 years- suspect involvement of lymphoma rather than a primary gynecologic malignancy. --------------------------------------------------------------   DIAGNOSIS: SMALL LYMPHOCYTIC LEUKEMIA  STAGE:  IV    ;GOALS: control/pallaitive  CURRENT/MOST RECENT THERAPY: Venatoclax.    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  Jacqueline Webb 81 y.o.  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  Patient had a recent fall at home getting of the shower-approximately 2 to 3 weeks ago.  The fall was mechanical. She hit her head but did not lose consciousness.  Patient was seen in the emergency room; however she did not wait long enough for the CT scan brain.   She continues to have bruises of the left flank.  No bruising head.  Denies any head pain. Patient has not had any new lumps or bumps.  No abdominal pain nausea vomiting.  Chronic mild lower extremity swelling on Lasix as needed. No diarrhea no bleeding.  Review of Systems  Constitutional:  Positive for  malaise/fatigue and weight loss. Negative for chills, diaphoresis and fever.  HENT:  Negative for nosebleeds and sore throat.   Eyes:  Negative for double vision.   Respiratory:  Negative for cough, hemoptysis, sputum production, shortness of breath and wheezing.   Cardiovascular:  Positive for leg swelling. Negative for chest pain, palpitations and orthopnea.  Gastrointestinal:  Negative for abdominal pain, blood in stool, constipation, diarrhea, heartburn, melena and vomiting.  Genitourinary:  Negative for dysuria, frequency and urgency.  Musculoskeletal:  Positive for back pain and joint pain.  Neurological:  Negative for dizziness, tingling, focal weakness, weakness and headaches.  Psychiatric/Behavioral:  Negative for depression. The patient is not nervous/anxious and does not have insomnia.     PAST MEDICAL HISTORY :  Past Medical History:  Diagnosis Date  . A-fib ([place])   . Benign neoplasm of rectum and anal canal   . 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 Dysrhythmia    A-Fib  . Hypercholesteremia   . Hypertension   . Hyperthyroidism   . Lymphoma ([place]) 2007  . Non Hodgkin's lymphoma ([place]) 01/22/2006  . Personal history of chemotherapy     PAST SURGICAL HISTORY :   Past Surgical History:  Procedure Laterality Date  . ABDOMINAL HYSTERECTOMY  1958  . APPENDECTOMY    . BREAST BIOPSY Right 1970   right breast biopsy with clip- neg  . BREAST BIOPSY Left 2002   left breast biopsy with clip-neg  . BREAST BIOPSY Right 2014   right breast biopsy with clip-neg  . BREAST CYST ASPIRATION Bilateral 2000   bilateral fine needle aspiration  . BREAST SURGERY Left 1990   biopsy  . BREAST SURGERY Right May 08, 2012   complex fibroadenoma without malignancy.  [name] CARDIAC CATHETERIZATION  2014  . COLONOSCOPY  2009, 2012    hyperplastic rectal polyp 2012 tubular adenoma of proximal ascending colon 2000 9 repeat exam due to 2017.  [name] COLONOSCOPY WITH PROPOFOL N/A 11/06/2016   Procedure: COLONOSCOPY WITH PROPOFOL;  Surgeon: Robert Bellow, MD;  Location: [facility] ENDOSCOPY;  Service: Endoscopy;   Laterality: N/A;  . COLONOSCOPY WITH PROPOFOL N/A 02/27/2019   Procedure: COLONOSCOPY WITH PROPOFOL;  Surgeon: Robert Bellow, MD;  Location: [facility];  Service: General;  Laterality: N/A;  . CORONARY ARTERY BYPASS GRAFT  1999  . FLEXIBLE SIGMOIDOSCOPY  1998    FAMILY HISTORY :   Family History  Problem Relation Age of Onset  . Heart disease Mother        Died age 61  . Asthma Mother   . Lung cancer Father        Died age 79  . Heart attack Sister   . Tuberculosis Sister        53 years old  . Lung cancer Sister        Died in her 67s  . Angina Sister   . Breast cancer Sister 53  . Rheum arthritis Sister   . Aneurysm Sister        Brain, died age 55  . Angina Sister   . CAD Brother        double bypass  . Heart disease Brother        Stent placement  . Heart disease Brother        stent placement  . Cancer Niece        breast cancer niece, pancreatic cancer niece  . Breast cancer Niece   .  Ovarian cancer Neg Hx   . Diabetes Neg Hx     SOCIAL HISTORY:   Social History   Tobacco Use  . Smoking status: Never  . Smokeless tobacco: Never  Vaping Use  . Vaping Use: Never used  Substance Use Topics  . Alcohol use: Yes    Alcohol/week: 0.0 - 7.0 standard drinks of alcohol    Comment: 1 drink a day of wine or cocktail  . Drug use: No    ALLERGIES:  is allergic to pravastatin.  MEDICATIONS:  Current Outpatient Medications  Medication Sig Dispense Refill  . Docusate Calcium (STOOL SOFTENER PO) Take by mouth as needed.    . ezetimibe (ZETIA) 10 MG tablet Take 1 tablet (10 mg total) daily by mouth. 30 tablet 12  . isosorbide mononitrate (IMDUR) 30 MG 24 hr tablet Take 30 mg by mouth daily.    [name] levothyroxine (SYNTHROID) 50 MCG tablet TAKE 1 TABLET(50 MCG) BY MOUTH DAILY BEFORE AND BREAKFAST 90 tablet 3  . lisinopril-hydrochlorothiazide (ZESTORETIC) 20-12.5 MG tablet TAKE 1 TABLET BY MOUTH  DAILY 90 tablet 3  . melatonin 5 MG TABS Take 5 mg by mouth.    .  metoprolol succinate (TOPROL-XL) 25 MG 24 hr tablet Take 25 mg by mouth daily.     [name] oxybutynin (DITROPAN) 5 MG tablet TAKE 1 TABLET BY MOUTH  TWICE DAILY 180 tablet 3  . venetoclax (VENCLEXTA) 100 MG tablet TAKE 3 TABLETS BY MOUTH DAILY 90 tablet 0  . XARELTO 20 MG TABS tablet Take 20 mg by mouth daily.    . furosemide (LASIX) 20 MG tablet Take 1 tablet (20 mg total) by mouth every morning. (Patient not taking: Reported on 08/02/2021) 30 tablet 5  . KLOR-CON M10 10 MEQ tablet Take 10 mEq by mouth 2 (two) times daily as needed. Take with Furosemide (Patient not taking: Reported on 08/02/2021)    . nitrofurantoin, macrocrystal-monohydrate, (MACROBID) 100 MG capsule Take 1 capsule (100 mg total) by mouth 2 (two) times daily. (Patient not taking: Reported on 06/06/2021) 10 capsule 5  . ondansetron (ZOFRAN) 4 MG tablet Take 1 tablet (4 mg total) by mouth every 8 (eight) hours as needed for nausea or vomiting. (Patient not taking: Reported on 06/06/2021) 40 tablet 3  . rosuvastatin (CRESTOR) 5 MG tablet Take 5 mg by mouth daily. Takes 3 times a week     No current facility-administered medications for this visit.   Facility-Administered Medications Ordered in Other Visits  Medication Dose Route Frequency Provider Last Rate Last Admin  . heparin lock flush 100 unit/mL  500 Units Intravenous Once Charlaine Dalton R, MD      . sodium chloride flush (NS) 0.9 % injection 10 mL  10 mL Intravenous PRN Cammie Sickle, MD        PHYSICAL EXAMINATION: ECOG PERFORMANCE STATUS: 1 - Symptomatic but completely ambulatory  BP (!) 107/56 (BP Location: Left Arm, Patient Position: Sitting)   Pulse 71   Temp 97.9 F (36.6 C) (Tympanic)   Resp 16   Wt 132 lb (59.9 kg)   SpO2 99%   BMI 22.66 kg/m   Filed Weights   08/02/21 1011  Weight: 132 lb (59.9 kg)   Bilateral lower extremity swelling.  Physical Exam HENT:     Head: Normocephalic and atraumatic.     Mouth/Throat:     Pharynx: No  oropharyngeal exudate.  Eyes:     Pupils: Pupils are equal, round, and reactive to light.  Cardiovascular:  Rate and Rhythm: Normal rate and regular rhythm.  Pulmonary:     Effort: No respiratory distress.     Breath sounds: No wheezing.  Abdominal:     General: Bowel sounds are normal. There is no distension.     Palpations: Abdomen is soft. There is no mass.     Tenderness: There is no abdominal tenderness. There is no guarding or rebound.  Musculoskeletal:        General: No tenderness. Normal range of motion.     Cervical back: Normal range of motion and neck supple.  Skin:    General: Skin is warm.  Neurological:     Mental Status: She is alert and oriented to person, place, and time.  Psychiatric:        Mood and Affect: Affect normal.    LABORATORY DATA:  I have reviewed the data as listed    Component Value Date/Time   NA 134 (L) 08/02/2021 0935   NA 139 07/03/2017 1425   NA 141 05/11/2014 0853   K 3.7 08/02/2021 0935   K 3.6 05/11/2014 0853   CL 102 08/02/2021 0935   CL 103 05/11/2014 0853   CO2 24 08/02/2021 0935   CO2 29 05/11/2014 0853   GLUCOSE 116 (H) 08/02/2021 0935   GLUCOSE 107 (H) 05/11/2014 0853   BUN 21 08/02/2021 0935   BUN 19 07/03/2017 1425   BUN 22 (H) 05/11/2014 0853   CREATININE 0.96 08/02/2021 0935   CREATININE 1.07 (H) 05/11/2014 0853   CALCIUM 9.3 08/02/2021 0935   CALCIUM 10.1 05/11/2014 0853   PROT 6.9 08/02/2021 0935   PROT 6.4 07/03/2017 1425   PROT 7.3 05/11/2014 0853   ALBUMIN 4.1 08/02/2021 0935   ALBUMIN 4.3 07/03/2017 1425   ALBUMIN 4.7 05/11/2014 0853   AST 34 08/02/2021 0935   AST 29 05/11/2014 0853   ALT 26 08/02/2021 0935   ALT 20 05/11/2014 0853   ALKPHOS 76 08/02/2021 0935   ALKPHOS 47 05/11/2014 0853   BILITOT 1.0 08/02/2021 0935   BILITOT 0.5 07/03/2017 1425   BILITOT 1.0 05/11/2014 0853   GFRNONAA 59 (L) 08/02/2021 0935   GFRNONAA 51 (L) 05/11/2014 0853   GFRAA >60 08/05/2019 0941   GFRAA 59 (L)  05/11/2014 0853    No results found for: "SPEP", "UPEP"  Lab Results  Component Value Date   WBC 2.9 (L) 08/02/2021   NEUTROABS 1.8 08/02/2021   HGB 12.5 08/02/2021   HCT 37.0 08/02/2021   MCV 91.6 08/02/2021   PLT 68 (L) 08/02/2021      Chemistry      Component Value Date/Time   NA 134 (L) 08/02/2021 0935   NA 139 07/03/2017 1425   NA 141 05/11/2014 0853   K 3.7 08/02/2021 0935   K 3.6 05/11/2014 0853   CL 102 08/02/2021 0935   CL 103 05/11/2014 0853   CO2 24 08/02/2021 0935   CO2 29 05/11/2014 0853   BUN 21 08/02/2021 0935   BUN 19 07/03/2017 1425   BUN 22 (H) 05/11/2014 0853   CREATININE 0.96 08/02/2021 0935   CREATININE 1.07 (H) 05/11/2014 0853   GLU 98 05/25/2013 0000      Component Value Date/Time   CALCIUM 9.3 08/02/2021 0935   CALCIUM 10.1 05/11/2014 0853   ALKPHOS 76 08/02/2021 0935   ALKPHOS 47 05/11/2014 0853   AST 34 08/02/2021 0935   AST 29 05/11/2014 0853   ALT 26 08/02/2021 0935   ALT 20 05/11/2014 0853  BILITOT 1.0 08/02/2021 0935   BILITOT 0.5 07/03/2017 1425   BILITOT 1.0 05/11/2014 0853       RADIOGRAPHIC STUDIES: I have personally reviewed the radiological images as listed and agreed with the findings in the report. No results found.   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 JAN 2023- no findings suggestive of active lymphoma- STABLE. Will repeat scans in 3 months/next visit.   #Continue venetoclax at current dose. Again discussed duration fixed vs. Indefinite.  Patient preference to continue indefinite venetoclax for now.  Tolerating Venclexta well without any major side effects-except platelets in 60-70s  Discussed Venclexta dose reduction if platelets drop further/ bleeding.    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 Fib-stable continue Xarelto [Dr.Kowalski].STABLE; see below  # Bilateral lower extremity swelling-on Lasix-improved defer to Dr.Gilbert re: continued lasix. STABLE  # Mechanical fall- no syncope; s/p evaluation in ER-again long discussion with the patient regarding the importance of avoiding falls given her age risk of fractures; and also the risk of bleeding in the brain-with the thrombocytopenia/Xarelto.   # DISPOSITION:  # follow up in 3 months-MD: labs- cbc/cmp/ldh;Thyroid profile; CT scan CAP prior-- Dr.B       Orders Placed This Encounter  Procedures  . CT ABDOMEN PELVIS W CONTRAST    Standing Status:   Future    Standing Expiration Date:   08/03/2022    Order Specific Question:   If indicated for the ordered procedure, I authorize the administration of contrast media per Radiology protocol    Answer:   Yes    Order Specific Question:   Preferred imaging location?    Answer:   [facility]    Order Specific Question:   Radiology Contrast Protocol - do NOT remove file path    Answer:   [email or web address]  . CBC with Differential    Standing Status:   Future    Standing Expiration Date:   08/02/2022  . Comprehensive metabolic panel    Standing Status:   Future    Standing Expiration Date:   08/02/2022  . Lactate dehydrogenase    Standing Status:   Future    Standing Expiration Date:   08/03/2022  . Thyroid Panel With TSH    Standing Status:   Future    Standing Expiration Date:   08/03/2022    All questions were answered. The patient knows to call the clinic with any problems, questions or concerns.      Cammie Sickle, MD 08/02/2021 10:56 AM

## 2021-08-02 NOTE — Assessment & Plan Note (Addendum)
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 JAN 2023- no findings suggestive of active lymphoma- STABLE. Will repeat scans in 3 months/next visit.   #Continue venetoclax at current dose. Again discussed duration fixed vs. Indefinite.  Patient preference to continue indefinite venetoclax for now.  Tolerating Venclexta well without any major side effects-except platelets in 60-70s  Discussed Venclexta dose reduction if platelets drop further/ bleeding.    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 Fib-stable continue Xarelto [Dr.Kowalski].STABLE; see below  # Bilateral lower extremity swelling-on Lasix-improved defer to Dr.Gilbert re: continued lasix. STABLE  # Mechanical fall- no syncope; s/p evaluation in ER-again long discussion with the patient regarding the importance of avoiding falls given her age risk of fractures; and also the risk of bleeding in the brain-with the thrombocytopenia/Xarelto.   # DISPOSITION:  # follow up in 3 months-MD: labs- cbc/cmp/ldh;Thyroid profile; CT scan CAP prior-- Dr.B

## 2021-08-03 ENCOUNTER — Other Ambulatory Visit: Payer: Self-pay | Admitting: Pharmacist

## 2021-08-03 ENCOUNTER — Other Ambulatory Visit (HOSPITAL_COMMUNITY): Payer: Self-pay

## 2021-08-03 DIAGNOSIS — C8308 Small cell B-cell lymphoma, lymph nodes of multiple sites: Secondary | ICD-10-CM

## 2021-08-03 LAB — THYROID PANEL WITH TSH
Free Thyroxine Index: 2.9 (ref 1.2–4.9)
T3 Uptake Ratio: 31 % (ref 24–39)
T4, Total: 9.3 ug/dL (ref 4.5–12.0)
TSH: 1.31 u[IU]/mL (ref 0.450–4.500)

## 2021-08-03 MED ORDER — VENETOCLAX 100 MG PO TABS
300.0000 mg | ORAL_TABLET | Freq: Every day | ORAL | 3 refills | Status: DC
  Filled 2021-08-03: qty 90, 30d supply, fill #0
  Filled 2021-08-31: qty 90, 30d supply, fill #1
  Filled 2021-09-29: qty 90, 30d supply, fill #2
  Filled 2021-10-30: qty 90, 30d supply, fill #3

## 2021-08-07 ENCOUNTER — Other Ambulatory Visit (HOSPITAL_COMMUNITY): Payer: Self-pay

## 2021-08-31 ENCOUNTER — Other Ambulatory Visit (HOSPITAL_COMMUNITY): Payer: Self-pay

## 2021-09-07 ENCOUNTER — Other Ambulatory Visit (HOSPITAL_COMMUNITY): Payer: Self-pay

## 2021-09-24 ENCOUNTER — Other Ambulatory Visit: Payer: Self-pay | Admitting: Family Medicine

## 2021-09-28 ENCOUNTER — Other Ambulatory Visit (HOSPITAL_COMMUNITY): Payer: Self-pay

## 2021-09-29 ENCOUNTER — Other Ambulatory Visit (HOSPITAL_COMMUNITY): Payer: Self-pay

## 2021-10-09 ENCOUNTER — Other Ambulatory Visit (HOSPITAL_COMMUNITY): Payer: Self-pay

## 2021-10-10 ENCOUNTER — Other Ambulatory Visit: Payer: Self-pay | Admitting: Family Medicine

## 2021-10-10 DIAGNOSIS — N3941 Urge incontinence: Secondary | ICD-10-CM

## 2021-10-10 NOTE — Telephone Encounter (Signed)
Requested Prescriptions  Pending Prescriptions Disp Refills  . oxybutynin (DITROPAN) 5 MG tablet [Pharmacy Med Name: Oxybutynin Chloride 5 MG Oral Tablet] 180 tablet 2    Sig: TAKE 1 TABLET BY MOUTH  TWICE DAILY     Urology:  Bladder Agents Passed - 10/10/2021 10:46 AM      Passed - Valid encounter within last 12 months    Recent Outpatient Visits          4 months ago Essential (primary) hypertension   [facility] [name], MD   11 months ago Adult hypothyroidism   [facility] [name], MD   1 year ago [place] [name], MD   1 year ago Adult hypothyroidism   [facility] [name], MD   2 years ago Adult hypothyroidism   [facility] [name], MD      Future Appointments            Tomorrow [name], MD [facility]           . levothyroxine (SYNTHROID) 88 MCG tablet [Pharmacy Med Name: Levothyroxine Sodium 88 MCG Oral Tablet] 90 tablet 3    Sig: TAKE 1 TABLET BY MOUTH  DAILY BEFORE BREAKFAST     Endocrinology:  Hypothyroid Agents Passed - 10/10/2021 10:46 AM      Passed - TSH in normal range and within 360 days    TSH  Date Value Ref Range Status  08/02/2021 1.310 0.450 - 4.500 uIU/mL Final         Passed - Valid encounter within last 12 months    Recent Outpatient Visits          4 months ago Essential (primary) hypertension   [facility] [name], MD   11 months ago Adult hypothyroidism   [facility] [name], MD   1 year ago [place] [name], MD   1 year ago Adult hypothyroidism   [facility] [name], MD   2 years ago Adult hypothyroidism   [facility] [name], MD      Future Appointments             Tomorrow [name], MD [facility], [place]

## 2021-10-11 ENCOUNTER — Encounter: Payer: Self-pay | Admitting: Family Medicine

## 2021-10-11 ENCOUNTER — Ambulatory Visit (INDEPENDENT_AMBULATORY_CARE_PROVIDER_SITE_OTHER): Payer: Medicare Other | Admitting: Family Medicine

## 2021-10-11 VITALS — BP 120/70 | HR 72 | Resp 16 | Wt 133.0 lb

## 2021-10-11 DIAGNOSIS — I872 Venous insufficiency (chronic) (peripheral): Secondary | ICD-10-CM

## 2021-10-11 DIAGNOSIS — C8308 Small cell B-cell lymphoma, lymph nodes of multiple sites: Secondary | ICD-10-CM

## 2021-10-11 DIAGNOSIS — I25810 Atherosclerosis of coronary artery bypass graft(s) without angina pectoris: Secondary | ICD-10-CM | POA: Diagnosis not present

## 2021-10-11 DIAGNOSIS — I10 Essential (primary) hypertension: Secondary | ICD-10-CM | POA: Diagnosis not present

## 2021-10-11 DIAGNOSIS — Z23 Encounter for immunization: Secondary | ICD-10-CM

## 2021-10-11 DIAGNOSIS — E039 Hypothyroidism, unspecified: Secondary | ICD-10-CM | POA: Diagnosis not present

## 2021-10-11 DIAGNOSIS — Z853 Personal history of malignant neoplasm of breast: Secondary | ICD-10-CM

## 2021-10-11 DIAGNOSIS — I4819 Other persistent atrial fibrillation: Secondary | ICD-10-CM

## 2021-10-11 NOTE — Progress Notes (Signed)
Established patient visit  I,April Miller,acting as a scribe for [name], MD.,have documented all relevant documentation on the behalf of [name], MD,as directed by  [name], MD while in the presence of [name], MD.   Patient: Jacqueline Webb   DOB: 1940-03-19   81 y.o. Female  MRN: [id number] Visit Date: 10/11/2021  Today's healthcare provider: [name], MD   Chief Complaint  Patient presents with   Follow-up   Hypertension   Subjective    HPI  Patient continues to live independently with her husband who is in poor health. 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 She has had no falls.  She continues to play golf regularly.  Hypertension, follow-up  BP Readings from Last 3 Encounters:  10/11/21 120/70  08/02/21 (!) 107/56  06/06/21 119/76   Wt Readings from Last 3 Encounters:  10/11/21 133 lb (60.3 kg)  08/02/21 132 lb (59.9 kg)  06/06/21 135 lb 11.2 oz (61.6 kg)     She was last seen for hypertension 4 months ago.  BP at that visit was 119/76. Management since that visit includes no changes.  Outside blood pressures are not checking.  Pertinent labs Lab Results  Component Value Date   CHOL 165 11/04/2019   HDL 87 11/04/2019   LDLCALC 68 11/04/2019   TRIG 50 11/04/2019   CHOLHDL 1.9 11/04/2019   Lab Results  Component Value Date   NA 134 (L) 08/02/2021   K 3.7 08/02/2021   CREATININE 0.96 08/02/2021   GFRNONAA 59 (L) 08/02/2021   GLUCOSE 116 (H) 08/02/2021   TSH 1.310 08/02/2021     The ASCVD Risk score (Arnett DK, et al., 2019) failed to calculate for the following reasons:   The 2019 ASCVD risk score is only valid for ages 41 to 83  --------------------------------------------------------------------------------------------------- Hypothyroid, follow-up  Lab Results  Component Value Date   TSH 1.310 08/02/2021   TSH 0.469 05/03/2021   TSH 0.190 (L) 08/03/2020   T4TOTAL  9.3 08/02/2021   T4TOTAL 8.2 05/03/2021    Wt Readings from Last 3 Encounters:  10/11/21 133 lb (60.3 kg)  08/02/21 132 lb (59.9 kg)  06/06/21 135 lb 11.2 oz (61.6 kg)    She was last seen for hypothyroid 4 months ago.  Management since that visit includes no changes.  -----------------------------------------------------------------------------------------   Medications: Outpatient Medications Prior to Visit  Medication Sig   Docusate Calcium (STOOL SOFTENER PO) Take by mouth as needed.   ezetimibe (ZETIA) 10 MG tablet Take 1 tablet (10 mg total) daily by mouth.   furosemide (LASIX) 20 MG tablet Take 1 tablet (20 mg total) by mouth every morning.   isosorbide mononitrate (IMDUR) 30 MG 24 hr tablet Take 30 mg by mouth daily.   levothyroxine (SYNTHROID) 50 MCG tablet TAKE 1 TABLET(50 MCG) BY MOUTH DAILY BEFORE AND BREAKFAST   lisinopril-hydrochlorothiazide (ZESTORETIC) 20-12.5 MG tablet TAKE 1 TABLET BY MOUTH  DAILY   melatonin 5 MG TABS Take 5 mg by mouth.   metoprolol succinate (TOPROL-XL) 25 MG 24 hr tablet Take 25 mg by mouth daily.    ondansetron (ZOFRAN) 4 MG tablet Take 1 tablet (4 mg total) by mouth every 8 (eight) hours as needed for nausea or vomiting.   oxybutynin (DITROPAN) 5 MG tablet TAKE 1 TABLET BY MOUTH  TWICE DAILY   rosuvastatin (CRESTOR) 5 MG tablet Take 5 mg by mouth daily. Takes 3  times a week   venetoclax (VENCLEXTA) 100 MG tablet Take 3 tablets (300 mg total) by mouth daily. Take with food.   XARELTO 20 MG TABS tablet Take 20 mg by mouth daily.   [DISCONTINUED] KLOR-CON M10 10 MEQ tablet Take 10 mEq by mouth 2 (two) times daily as needed. Take with Furosemide (Patient not taking: Reported on 08/02/2021)   [DISCONTINUED] nitrofurantoin, macrocrystal-monohydrate, (MACROBID) 100 MG capsule Take 1 capsule (100 mg total) by mouth 2 (two) times daily. (Patient not taking: Reported on 06/06/2021)   Facility-Administered Medications Prior to Visit  Medication Dose  Route Frequency Provider   heparin lock flush 100 unit/mL  500 Units Intravenous Once Charlaine Dalton R, MD   sodium chloride flush (NS) 0.9 % injection 10 mL  10 mL Intravenous PRN Cammie Sickle, MD    Review of Systems  Constitutional:  Negative for appetite change, chills, fatigue and fever.  Respiratory:  Negative for chest tightness and shortness of breath.   Cardiovascular:  Negative for chest pain and palpitations.  Gastrointestinal:  Negative for abdominal pain, nausea and vomiting.  Neurological:  Negative for dizziness and weakness.    Last lipids Lab Results  Component Value Date   CHOL 165 11/04/2019   HDL 87 11/04/2019   LDLCALC 68 11/04/2019   TRIG 50 11/04/2019   CHOLHDL 1.9 11/04/2019       Objective    BP 120/70 (BP Location: Left Arm, Patient Position: Sitting, Cuff Size: Normal)   Pulse 72   Resp 16   Wt 133 lb (60.3 kg)   SpO2 97%   BMI 22.83 kg/m  BP Readings from Last 3 Encounters:  10/11/21 120/70  08/02/21 (!) 107/56  06/06/21 119/76   Wt Readings from Last 3 Encounters:  10/11/21 133 lb (60.3 kg)  08/02/21 132 lb (59.9 kg)  06/06/21 135 lb 11.2 oz (61.6 kg)      Physical Exam Vitals reviewed.  Constitutional:      General: She is not in acute distress.    Appearance: She is well-developed.  HENT:     Head: Normocephalic and atraumatic.     Right Ear: Hearing normal.     Left Ear: Hearing normal.     Nose: Nose normal.  Eyes:     General: Lids are normal. No scleral icterus.       Right eye: No discharge.        Left eye: No discharge.     Conjunctiva/sclera: Conjunctivae normal.  Cardiovascular:     Rate and Rhythm: Normal rate and regular rhythm.     Heart sounds: Normal heart sounds.  Pulmonary:     Effort: Pulmonary effort is normal. No respiratory distress.  Skin:    Findings: No lesion or rash.  Neurological:     General: No focal deficit present.     Mental Status: She is alert and oriented to person,  place, and time.  Psychiatric:        Mood and Affect: Mood normal.        Speech: Speech normal.        Behavior: Behavior normal.        Thought Content: Thought content normal.        Judgment: Judgment normal.       No results found for any visits on 10/11/21.  Assessment & Plan     1. Coronary artery disease involving coronary bypass graft of native heart without angina pectoris Factors treated and patient asymptomatic for  any active disease  2. Essential (primary) hypertension Good control  3. Adult hypothyroidism Euthyroid TSH  4. Need for immunization against influenza  - Flu Vaccine QUAD High Dose(Fluad)  5. Need for pneumococcal vaccine  - Pneumococcal conjugate vaccine 20-valent (Prevnar 20)  6. Persistent atrial fibrillation (HCC) Xarelto  7. Venous insufficiency of both lower extremities   8. 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 Personal history of malignant neoplasm of breast    No follow-ups on file.      I, [name], MD, have reviewed all documentation for this visit. The documentation on 10/15/21 for the exam, diagnosis, procedures, and orders are all accurate and complete.    Coben Godshall Cranford Mon, MD  [facility] [phone] (phone) [phone] (fax)  [place]

## 2021-10-18 ENCOUNTER — Ambulatory Visit
Admission: RE | Admit: 2021-10-18 | Discharge: 2021-10-18 | Disposition: A | Discharge status: Home / Self Care | Payer: Medicare Other | Source: Ambulatory Visit | Attending: Internal Medicine | Admitting: Internal Medicine

## 2021-10-18 DIAGNOSIS — C8308 Small cell B-cell lymphoma, lymph nodes of multiple sites: Secondary | ICD-10-CM | POA: Insufficient documentation

## 2021-10-18 LAB — POCT I-STAT CREATININE: Creatinine, Ser: 1 mg/dL (ref 0.44–1.00)

## 2021-10-18 MED ORDER — IOHEXOL 300 MG/ML  SOLN
100.0000 mL | Freq: Once | INTRAMUSCULAR | Status: AC | PRN
  Administered 2021-10-18: 100 mL via INTRAVENOUS

## 2021-10-26 ENCOUNTER — Other Ambulatory Visit (HOSPITAL_COMMUNITY): Payer: Self-pay

## 2021-10-30 ENCOUNTER — Other Ambulatory Visit (HOSPITAL_COMMUNITY): Payer: Self-pay

## 2021-11-01 ENCOUNTER — Inpatient Hospital Stay (HOSPITAL_BASED_OUTPATIENT_CLINIC_OR_DEPARTMENT_OTHER): Payer: Medicare Other | Admitting: Internal Medicine

## 2021-11-01 ENCOUNTER — Inpatient Hospital Stay: Payer: Medicare Other | Attending: Internal Medicine

## 2021-11-01 ENCOUNTER — Encounter: Payer: Self-pay | Admitting: Internal Medicine

## 2021-11-01 VITALS — BP 141/82 | Temp 97.2°F | Resp 18 | Wt 129.2 lb

## 2021-11-01 DIAGNOSIS — I4891 Unspecified atrial fibrillation: Secondary | ICD-10-CM | POA: Insufficient documentation

## 2021-11-01 DIAGNOSIS — C8308 Small cell B-cell lymphoma, lymph nodes of multiple sites: Secondary | ICD-10-CM | POA: Diagnosis present

## 2021-11-01 DIAGNOSIS — M7989 Other specified soft tissue disorders: Secondary | ICD-10-CM | POA: Insufficient documentation

## 2021-11-01 DIAGNOSIS — Z79899 Other long term (current) drug therapy: Secondary | ICD-10-CM | POA: Insufficient documentation

## 2021-11-01 DIAGNOSIS — Z7901 Long term (current) use of anticoagulants: Secondary | ICD-10-CM | POA: Insufficient documentation

## 2021-11-01 DIAGNOSIS — D696 Thrombocytopenia, unspecified: Secondary | ICD-10-CM | POA: Insufficient documentation

## 2021-11-01 DIAGNOSIS — R1900 Intra-abdominal and pelvic swelling, mass and lump, unspecified site: Secondary | ICD-10-CM | POA: Diagnosis not present

## 2021-11-01 DIAGNOSIS — Z7969 Long term (current) use of other immunomodulators and immunosuppressants: Secondary | ICD-10-CM | POA: Insufficient documentation

## 2021-11-01 LAB — CBC WITH DIFFERENTIAL/PLATELET
Abs Immature Granulocytes: 0.01 10*3/uL (ref 0.00–0.07)
Basophils Absolute: 0 10*3/uL (ref 0.0–0.1)
Basophils Relative: 0 %
Eosinophils Absolute: 0 10*3/uL (ref 0.0–0.5)
Eosinophils Relative: 0 %
HCT: 37.9 % (ref 36.0–46.0)
Hemoglobin: 13.2 g/dL (ref 12.0–15.0)
Immature Granulocytes: 0 %
Lymphocytes Relative: 26 %
Lymphs Abs: 0.8 10*3/uL (ref 0.7–4.0)
MCH: 30.8 pg (ref 26.0–34.0)
MCHC: 34.8 g/dL (ref 30.0–36.0)
MCV: 88.6 fL (ref 80.0–100.0)
Monocytes Absolute: 0.3 10*3/uL (ref 0.1–1.0)
Monocytes Relative: 10 %
Neutro Abs: 1.9 10*3/uL (ref 1.7–7.7)
Neutrophils Relative %: 64 %
Platelets: 72 10*3/uL — ABNORMAL LOW (ref 150–400)
RBC: 4.28 MIL/uL (ref 3.87–5.11)
RDW: 13 % (ref 11.5–15.5)
WBC: 2.9 10*3/uL — ABNORMAL LOW (ref 4.0–10.5)
nRBC: 0 % (ref 0.0–0.2)

## 2021-11-01 LAB — COMPREHENSIVE METABOLIC PANEL
ALT: 19 U/L (ref 0–44)
AST: 29 U/L (ref 15–41)
Albumin: 4 g/dL (ref 3.5–5.0)
Alkaline Phosphatase: 62 U/L (ref 38–126)
Anion gap: 7 (ref 5–15)
BUN: 12 mg/dL (ref 8–23)
CO2: 26 mmol/L (ref 22–32)
Calcium: 9.3 mg/dL (ref 8.9–10.3)
Chloride: 102 mmol/L (ref 98–111)
Creatinine, Ser: 0.99 mg/dL (ref 0.44–1.00)
GFR, Estimated: 57 mL/min — ABNORMAL LOW (ref >60)
Glucose, Bld: 115 mg/dL — ABNORMAL HIGH (ref 70–99)
Potassium: 3.5 mmol/L (ref 3.5–5.1)
Sodium: 135 mmol/L (ref 135–145)
Total Bilirubin: 1 mg/dL (ref 0.3–1.2)
Total Protein: 6.8 g/dL (ref 6.5–8.1)

## 2021-11-01 LAB — LACTATE DEHYDROGENASE: LDH: 155 U/L (ref 98–192)

## 2021-11-01 NOTE — Progress Notes (Signed)
Patient here today for follow up regarding lymphoma, CT scan results. Patient denies concerns.

## 2021-11-01 NOTE — Progress Notes (Signed)
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 Lymphoma low-grade, status Rituxan therapy. 2. Recurrent disease by clinical examination in December of 2012 3. Ovarian mass on PET scan in December of 2012 (normal CA 125). Ovarian mass has resolved after rituximab therapy. 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  Patient had total 5 cycles of chemotherapy with bendamustine and rituximab and 6 cycle was omitted because of significant side effects with and weakness and fatigue.  # OCT 2016- PET significant response; ON surveillance  # 18th OCT 2018- Gazyva;SEP 2018-/OCT 2018 CT scan- progression; s/p 6 cycles [#6 in April 2019]  # JAN 2020-CT scan shows progressive disease; start a acalabrutinib '100mg'$  BID;# feb 6th2020- decrease dose of acalbrutinib to 100 mg/day [sec to spon bruising] March 1st week- STOPPED [place] sec to spon brusing  # MARCH 17th 2020- Start Venetoclax [out pt ramp up]; 300 mg a day   # 11/06/2016- colonoscopy [Dr.Byrnett- 2 polyps]  #History of A.  Fib-Xarelto; CKD stage II-III.   # #Right posterior neck melanoma stage I [ Dr.Lee/Elkins]..   -------------------------------------------------------------------    # left ? Adnexal mass- ? Etiology; mild SUV uptake incidental [stable compared to previous PET in 2016] Previous workup 2015- vaginal ultrasound negative. MRI February 2018- approximately 3 cm in size; slow increase in the size of the left adnexal mass over at least 3 years- suspect involvement of lymphoma rather than a primary gynecologic malignancy. --------------------------------------------------------------   DIAGNOSIS: SMALL LYMPHOCYTIC LEUKEMIA  STAGE:  IV    ;GOALS: control/pallaitive  CURRENT/MOST RECENT THERAPY: Venatoclax.    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  Jacqueline Webb 81 y.o.  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    Patient has not had any new lumps or bumps.  No abdominal pain nausea vomiting.  Chronic mild lower extremity swelling on Lasix as needed. No diarrhea no bleeding.  No fever no chills.  No nausea no vomiting.  Review of Systems  Constitutional:  Positive for malaise/fatigue and weight loss. Negative for chills, diaphoresis and fever.  HENT:  Negative for nosebleeds and sore throat.   Eyes:  Negative for double vision.  Respiratory:  Negative for cough, hemoptysis, sputum production, shortness of breath and wheezing.   Cardiovascular:  Positive for leg swelling. Negative for chest pain, palpitations and orthopnea.  Gastrointestinal:  Negative for abdominal pain, blood in stool, constipation, diarrhea, heartburn, melena and  vomiting.  Genitourinary:  Negative for dysuria, frequency and urgency.  Musculoskeletal:  Positive for back pain and joint pain.  Neurological:  Negative for dizziness, tingling, focal weakness, weakness and headaches.  Psychiatric/Behavioral:  Negative for depression. The patient is not nervous/anxious and does not have insomnia.     PAST MEDICAL HISTORY :  Past Medical History:  Diagnosis Date   A-fib [facility])    Benign neoplasm of rectum and anal canal    Chronic leukemia of unspecified cell type, without mention of having achieved remission    Dysrhythmia    A-Fib   Hypercholesteremia    Hypertension    Hyperthyroidism    Lymphoma ([place]) 2007   Non Hodgkin's lymphoma ([place]) 01/22/2006   Personal history of chemotherapy     PAST SURGICAL HISTORY :   Past Surgical History:  Procedure Laterality Date   ABDOMINAL HYSTERECTOMY  1958   APPENDECTOMY     BREAST BIOPSY Right 1970   right breast biopsy with clip- neg   BREAST BIOPSY Left 2002   left breast biopsy with clip-neg   BREAST BIOPSY Right 2014   right breast biopsy with clip-neg   BREAST CYST ASPIRATION Bilateral 2000   bilateral fine needle aspiration   BREAST SURGERY Left 1990   biopsy   BREAST SURGERY Right May 08, 2012   complex fibroadenoma without malignancy.   CARDIAC CATHETERIZATION  2014   COLONOSCOPY  2009, 2012    hyperplastic rectal polyp 2012 tubular adenoma of proximal ascending colon 2000 9 repeat exam due to 2017.   COLONOSCOPY WITH PROPOFOL N/A 11/06/2016   Procedure: COLONOSCOPY WITH PROPOFOL;  Surgeon: Robert Bellow, MD;  Location: [facility] ENDOSCOPY;  Service: Endoscopy;  Laterality: N/A;   COLONOSCOPY WITH PROPOFOL N/A 02/27/2019   Procedure: COLONOSCOPY WITH PROPOFOL;  Surgeon: Robert Bellow, MD;  Location: [facility] ENDOSCOPY;  Service: General;  Laterality: N/A;   CORONARY ARTERY BYPASS GRAFT  1999   FLEXIBLE SIGMOIDOSCOPY  1998    FAMILY HISTORY :   Family History  Problem Relation Age of  Onset   Heart disease Mother        Died age 25   Asthma Mother    Lung cancer Father        Died age 9   Heart attack Sister    Tuberculosis Sister        43 years old   Lung cancer Sister        Died in her 43s   Angina Sister    Breast cancer Sister 85   Rheum arthritis Sister    Aneurysm Sister        Brain, died age 46   Angina Sister    CAD Brother        double bypass   Heart disease Brother        Stent placement   Heart disease Brother        stent placement   Cancer Niece        breast cancer niece, pancreatic cancer niece   Breast cancer Niece    Ovarian cancer Neg Hx    Diabetes Neg Hx     SOCIAL HISTORY:   Social History   Tobacco Use   Smoking status: Never   Smokeless tobacco: Never  Vaping Use   Vaping Use: Never used  Substance Use Topics   Alcohol use: Yes    Alcohol/week: 0.0 -  7.0 standard drinks of alcohol    Comment: 1 drink a day of wine or cocktail   Drug use: No    ALLERGIES:  is allergic to pravastatin.  MEDICATIONS:  Current Outpatient Medications  Medication Sig Dispense Refill   Docusate Calcium (STOOL SOFTENER PO) Take by mouth as needed.     ezetimibe (ZETIA) 10 MG tablet Take 1 tablet (10 mg total) daily by mouth. 30 tablet 12   furosemide (LASIX) 20 MG tablet Take 1 tablet (20 mg total) by mouth every morning. 30 tablet 5   isosorbide mononitrate (IMDUR) 30 MG 24 hr tablet Take 30 mg by mouth daily.     levothyroxine (SYNTHROID) 50 MCG tablet TAKE 1 TABLET(50 MCG) BY MOUTH DAILY BEFORE AND BREAKFAST 90 tablet 3   lisinopril-hydrochlorothiazide (ZESTORETIC) 20-12.5 MG tablet TAKE 1 TABLET BY MOUTH  DAILY 90 tablet 3   melatonin 5 MG TABS Take 5 mg by mouth.     metoprolol succinate (TOPROL-XL) 25 MG 24 hr tablet Take 25 mg by mouth daily.      ondansetron (ZOFRAN) 4 MG tablet Take 1 tablet (4 mg total) by mouth every 8 (eight) hours as needed for nausea or vomiting. 40 tablet 3   oxybutynin (DITROPAN) 5 MG tablet TAKE 1  TABLET BY MOUTH  TWICE DAILY (Patient taking differently: daily.) 180 tablet 2   rosuvastatin (CRESTOR) 5 MG tablet Take 5 mg by mouth daily. Takes 3 times a week     venetoclax (VENCLEXTA) 100 MG tablet Take 3 tablets (300 mg total) by mouth daily. Take with food. 90 tablet 3   XARELTO 20 MG TABS tablet Take 20 mg by mouth daily.     No current facility-administered medications for this visit.   Facility-Administered Medications Ordered in Other Visits  Medication Dose Route Frequency Provider Last Rate Last Admin   heparin lock flush 100 unit/mL  500 Units Intravenous Once Charlaine Dalton R, MD       sodium chloride flush (NS) 0.9 % injection 10 mL  10 mL Intravenous PRN Cammie Sickle, MD        PHYSICAL EXAMINATION: ECOG PERFORMANCE STATUS: 1 - Symptomatic but completely ambulatory  BP (!) 141/82   Temp (!) 97.2 F (36.2 C) (Tympanic)   Resp 18   Wt 129 lb 3.2 oz (58.6 kg)   SpO2 98%   BMI 22.18 kg/m   Filed Weights   11/01/21 1038  Weight: 129 lb 3.2 oz (58.6 kg)   Bilateral lower extremity swelling.  Physical Exam HENT:     Head: Normocephalic and atraumatic.     Mouth/Throat:     Pharynx: No oropharyngeal exudate.  Eyes:     Pupils: Pupils are equal, round, and reactive to light.  Cardiovascular:     Rate and Rhythm: Normal rate and regular rhythm.  Pulmonary:     Effort: No respiratory distress.     Breath sounds: No wheezing.  Abdominal:     General: Bowel sounds are normal. There is no distension.     Palpations: Abdomen is soft. There is no mass.     Tenderness: There is no abdominal tenderness. There is no guarding or rebound.  Musculoskeletal:        General: No tenderness. Normal range of motion.     Cervical back: Normal range of motion and neck supple.  Skin:    General: Skin is warm.  Neurological:     Mental Status: She is alert and oriented to person,  place, and time.  Psychiatric:        Mood and Affect: Affect normal.      LABORATORY DATA:  I have reviewed the data as listed    Component Value Date/Time   NA 135 11/01/2021 0947   NA 139 07/03/2017 1425   NA 141 05/11/2014 0853   K 3.5 11/01/2021 0947   K 3.6 05/11/2014 0853   CL 102 11/01/2021 0947   CL 103 05/11/2014 0853   CO2 26 11/01/2021 0947   CO2 29 05/11/2014 0853   GLUCOSE 115 (H) 11/01/2021 0947   GLUCOSE 107 (H) 05/11/2014 0853   BUN 12 11/01/2021 0947   BUN 19 07/03/2017 1425   BUN 22 (H) 05/11/2014 0853   CREATININE 0.99 11/01/2021 0947   CREATININE 1.07 (H) 05/11/2014 0853   CALCIUM 9.3 11/01/2021 0947   CALCIUM 10.1 05/11/2014 0853   PROT 6.8 11/01/2021 0947   PROT 6.4 07/03/2017 1425   PROT 7.3 05/11/2014 0853   ALBUMIN 4.0 11/01/2021 0947   ALBUMIN 4.3 07/03/2017 1425   ALBUMIN 4.7 05/11/2014 0853   AST 29 11/01/2021 0947   AST 29 05/11/2014 0853   ALT 19 11/01/2021 0947   ALT 20 05/11/2014 0853   ALKPHOS 62 11/01/2021 0947   ALKPHOS 47 05/11/2014 0853   BILITOT 1.0 11/01/2021 0947   BILITOT 0.5 07/03/2017 1425   BILITOT 1.0 05/11/2014 0853   GFRNONAA 57 (L) 11/01/2021 0947   GFRNONAA 51 (L) 05/11/2014 0853   GFRAA >60 08/05/2019 0941   GFRAA 59 (L) 05/11/2014 0853    No results found for: "SPEP", "UPEP"  Lab Results  Component Value Date   WBC 2.9 (L) 11/01/2021   NEUTROABS 1.9 11/01/2021   HGB 13.2 11/01/2021   HCT 37.9 11/01/2021   MCV 88.6 11/01/2021   PLT 72 (L) 11/01/2021      Chemistry      Component Value Date/Time   NA 135 11/01/2021 0947   NA 139 07/03/2017 1425   NA 141 05/11/2014 0853   K 3.5 11/01/2021 0947   K 3.6 05/11/2014 0853   CL 102 11/01/2021 0947   CL 103 05/11/2014 0853   CO2 26 11/01/2021 0947   CO2 29 05/11/2014 0853   BUN 12 11/01/2021 0947   BUN 19 07/03/2017 1425   BUN 22 (H) 05/11/2014 0853   CREATININE 0.99 11/01/2021 0947   CREATININE 1.07 (H) 05/11/2014 0853   GLU 98 05/25/2013 0000      Component Value Date/Time   CALCIUM 9.3 11/01/2021 0947   CALCIUM  10.1 05/11/2014 0853   ALKPHOS 62 11/01/2021 0947   ALKPHOS 47 05/11/2014 0853   AST 29 11/01/2021 0947   AST 29 05/11/2014 0853   ALT 19 11/01/2021 0947   ALT 20 05/11/2014 0853   BILITOT 1.0 11/01/2021 0947   BILITOT 0.5 07/03/2017 1425   BILITOT 1.0 05/11/2014 0853       RADIOGRAPHIC STUDIES: I have personally reviewed the radiological images as listed and agreed with the findings in the report. No results found.   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 SEP 27th, 2023-- no findings suggestive of active lymphoma- STABLE.  #Continue venetoclax at current dose. Again discussed duration fixed vs. Indefinite. Recommend STOPPING at the end of the year [facility] 2021].  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 Fib-stable continue Xarelto [Dr.Kowalski].reviewed the cardiomegaly as noted on CT scan[not the best test]-patient awaiting cardiac evaluation later this morning.  # Bilateral lower extremity swelling-on Lasix-improved defer to Dr.Gilbert re: continued lasix. STABLE  #Longstanding adnexal mass on the LEFT increasing in size slowly over time as compared to more remote imaging from 2018. 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 Dedicated pelvic MRI could be performed on follow-up for complete characterization as warranted. Pt claustrophobic; declines MRI.  Recommen GYN-evaluation discussed the pros and cons of surgery Vs. surveillance.  #Incidental findings on Imaging  CT scan,sep 2023:Cortical cysts of the bilateral kidneys;  Cardiomegaly with RIGHT heart enlargement and LEFT atrial Enlargement; Mildly nodular hepatic contours  raising the question of liver disease, perhaps cardiogenic given findings above; Aortic Atherosclerosis; I reviewed/discussed/counseled the patient.   # DISPOSITION:  [wed appt- coordiate [place] appt] # referral to Gyn-Onc in 3 momths re: left pelvic mass-slow growing # follow up in 3 months-MD: labs- cbc/cmp/ldh;Thyroid profile-Dr.B  # I reviewed the blood work- with the patient in detail; also reviewed the imaging independently [as summarized above]; and with the patient in detail.          Orders Placed This Encounter  Procedures   CBC with Differential/Platelet    Standing Status:   Future    Standing Expiration Date:   11/01/2022   Comprehensive metabolic panel    Standing Status:   Future    Standing Expiration Date:   11/01/2022   Lactate dehydrogenase    Standing Status:   Future    Standing Expiration Date:   11/02/2022   Thyroid Panel With TSH    Standing Status:   Future    Standing Expiration Date:   11/02/2022   Ambulatory referral to Gynecologic Oncology    Referral Priority:   Routine    Referral Type:   Consultation    Referral Reason:   Specialty Services Required    Requested Specialty:   Gynecologic Oncology    Number of Visits Requested:   1    All questions were answered. The patient knows to call the clinic with any problems, questions or concerns.      Cammie Sickle, MD 11/01/2021 1:01 PM

## 2021-11-01 NOTE — Assessment & Plan Note (Addendum)
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 SEP 27th, 2023-- no findings suggestive of active lymphoma- STABLE.  #Continue venetoclax at current dose. Again discussed duration fixed vs. Indefinite. Recommend STOPPING at the end of the year [facility] 2021].  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 Fib-stable continue Xarelto [Dr.Kowalski].reviewed the cardiomegaly as noted on CT scan[not the best test]-patient awaiting cardiac evaluation later this morning.  # Bilateral lower extremity swelling-on Lasix-improved defer to Dr.Gilbert re: continued lasix. STABLE  #Longstanding adnexal mass on the LEFT increasing in size slowly over time as compared to more remote imaging from 2018. 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 Dedicated pelvic MRI could be performed on follow-up for complete characterization as warranted. Pt claustrophobic; declines MRI.  Recommen GYN-evaluation discussed the pros and cons of surgery Vs. surveillance.  #Incidental findings on Imaging  CT scan,sep 2023:Cortical cysts of the bilateral kidneys;  Cardiomegaly with RIGHT heart enlargement and LEFT atrial Enlargement; Mildly nodular hepatic contours raising the question of liver disease, perhaps cardiogenic given findings above; Aortic Atherosclerosis; I reviewed/discussed/counseled the patient.   # DISPOSITION:  [wed appt- coordiate [place] appt] # referral to Gyn-Onc in 3 momths re: left pelvic mass-slow growing # follow up in 3 months-MD: labs- cbc/cmp/ldh;Thyroid profile-Dr.B  # I reviewed the blood work- with the patient in detail; also reviewed the imaging independently [as summarized above]; and with the patient in detail.

## 2021-11-03 LAB — THYROID PANEL WITH TSH
Free Thyroxine Index: 3 (ref 1.2–4.9)
T3 Uptake Ratio: 32 % (ref 24–39)
T4, Total: 9.3 ug/dL (ref 4.5–12.0)
TSH: 0.682 u[IU]/mL (ref 0.450–4.500)

## 2021-11-08 ENCOUNTER — Other Ambulatory Visit (HOSPITAL_COMMUNITY): Payer: Self-pay

## 2021-11-14 ENCOUNTER — Other Ambulatory Visit (HOSPITAL_COMMUNITY): Payer: Self-pay

## 2021-11-29 ENCOUNTER — Other Ambulatory Visit (HOSPITAL_COMMUNITY): Payer: Self-pay

## 2021-12-01 ENCOUNTER — Other Ambulatory Visit (HOSPITAL_COMMUNITY): Payer: Self-pay

## 2021-12-04 ENCOUNTER — Other Ambulatory Visit (HOSPITAL_COMMUNITY): Payer: Self-pay

## 2021-12-04 ENCOUNTER — Other Ambulatory Visit: Payer: Self-pay | Admitting: Pharmacist

## 2021-12-04 DIAGNOSIS — C8308 Small cell B-cell lymphoma, lymph nodes of multiple sites: Secondary | ICD-10-CM

## 2021-12-04 MED ORDER — VENETOCLAX 100 MG PO TABS
300.0000 mg | ORAL_TABLET | Freq: Every day | ORAL | 0 refills | Status: DC
  Filled 2021-12-04 – 2021-12-27 (×2): qty 90, 30d supply, fill #0

## 2021-12-26 ENCOUNTER — Telehealth: Payer: Self-pay | Admitting: Family Medicine

## 2021-12-26 MED ORDER — LEVOTHYROXINE SODIUM 50 MCG PO TABS: ORAL_TABLET | ORAL | 0 refills | Status: DC

## 2021-12-26 NOTE — Telephone Encounter (Signed)
[place] faxed refill request for the following medications:   levothyroxine (SYNTHROID, LEVOTHROID) 88 MCG tablet     Please advise.

## 2021-12-27 ENCOUNTER — Other Ambulatory Visit (HOSPITAL_COMMUNITY): Payer: Self-pay

## 2021-12-27 ENCOUNTER — Other Ambulatory Visit: Payer: Self-pay

## 2022-01-10 ENCOUNTER — Other Ambulatory Visit (HOSPITAL_COMMUNITY): Payer: Self-pay

## 2022-01-11 ENCOUNTER — Other Ambulatory Visit (HOSPITAL_COMMUNITY): Payer: Self-pay

## 2022-01-31 ENCOUNTER — Encounter: Payer: Self-pay | Admitting: Internal Medicine

## 2022-01-31 ENCOUNTER — Telehealth: Payer: Self-pay

## 2022-01-31 ENCOUNTER — Other Ambulatory Visit (HOSPITAL_COMMUNITY): Payer: Self-pay

## 2022-01-31 NOTE — Telephone Encounter (Signed)
Left VM for patient to call back to inform them of grant.   Patient returned my call and is aware of new grant.  Ardeen Fillers, CPhT Oncology Pharmacy Patient Advocate  [facility]  [phone] (phone) [phone] (fax) 02/01/2022 4:16 PM

## 2022-01-31 NOTE — Telephone Encounter (Signed)
Oral Oncology Patient Advocate Encounter   Was successful in securing patient an $3,250 grant from Patient [facility]) to provide copayment coverage for Venclexta.  This will keep the out of pocket expense at $0.     I have spoken with the patient.    The billing information is as follows and has been shared with [place].   Member ID: [id number] Group ID: [id number] RxBin: [id number] Dates of Eligibility: 01/24/22 through 01/24/23  Fund:  Chronic Lymphocytic [place], [place] Patient [place]  [phone] (phone) [phone] (fax) 01/31/2022 10:33 AM

## 2022-02-07 ENCOUNTER — Other Ambulatory Visit: Payer: Medicare Other

## 2022-02-07 ENCOUNTER — Ambulatory Visit: Payer: Medicare Other | Admitting: Internal Medicine

## 2022-02-07 ENCOUNTER — Ambulatory Visit: Payer: Medicare Other

## 2022-02-26 ENCOUNTER — Other Ambulatory Visit: Payer: Self-pay | Admitting: Family Medicine

## 2022-02-28 ENCOUNTER — Other Ambulatory Visit: Payer: Self-pay | Admitting: Family Medicine

## 2022-02-28 DIAGNOSIS — I10 Essential (primary) hypertension: Secondary | ICD-10-CM

## 2022-02-28 MED ORDER — LISINOPRIL-HYDROCHLOROTHIAZIDE 20-12.5 MG PO TABS: 1.0000 | ORAL_TABLET | Freq: Every day | ORAL | 0 refills | Status: DC

## 2022-02-28 NOTE — Progress Notes (Signed)
Received fax for lisinopril-hctz refills   90 day supply with 0 refills submitted electronically    Eulis Foster, MD  [facility]

## 2022-03-06 ENCOUNTER — Other Ambulatory Visit (HOSPITAL_COMMUNITY): Payer: Self-pay

## 2022-03-27 ENCOUNTER — Ambulatory Visit (INDEPENDENT_AMBULATORY_CARE_PROVIDER_SITE_OTHER): Payer: Medicare Other

## 2022-03-27 VITALS — Ht 65.0 in | Wt 133.0 lb

## 2022-03-27 DIAGNOSIS — Z0000 Encounter for general adult medical examination without abnormal findings: Secondary | ICD-10-CM | POA: Diagnosis not present

## 2022-03-27 NOTE — Progress Notes (Signed)
I connected with  Dennie Maizes on 03/27/22 by a audio enabled telemedicine application and verified that I am speaking with the correct person using two identifiers.  Patient Location: Home  Provider Location: Office/Clinic  I discussed the limitations of evaluation and management by telemedicine. The patient expressed understanding and agreed to proceed.  Subjective:   Jacqueline Webb is a 82 y.o. female who presents for Medicare Annual (Subsequent) preventive examination.  Review of Systems           Objective:    Today's Vitals   03/27/22 0851  Weight: 133 lb (60.3 kg)  Height: '5\' 5"'$  (1.651 m)   Body mass index is 22.13 kg/m.     03/27/2022    9:03 AM 08/02/2021   10:02 AM 05/03/2021   10:26 AM 03/23/2021    9:15 AM 02/01/2021   10:17 AM 11/02/2020   10:13 AM 08/03/2020   10:42 AM  Advanced Directives  Does Patient Have a Medical Advance Directive? No No No No No No No  Would patient like information on creating a medical advance directive?   No - Patient declined No - Patient declined No - Patient declined No - Patient declined No - Patient declined    Current Medications (verified) Outpatient Encounter Medications as of 03/27/2022  Medication Sig   Docusate Calcium (STOOL SOFTENER PO) Take by mouth as needed.   ezetimibe (ZETIA) 10 MG tablet Take 1 tablet (10 mg total) daily by mouth.   furosemide (LASIX) 20 MG tablet Take 1 tablet (20 mg total) by mouth every morning.   isosorbide mononitrate (IMDUR) 30 MG 24 hr tablet Take 30 mg by mouth daily.   levothyroxine (SYNTHROID) 50 MCG tablet TAKE 1 TABLET BY MOUTH DAILY  BEFORE BREAKFAST   lisinopril-hydrochlorothiazide (ZESTORETIC) 20-12.5 MG tablet Take 1 tablet by mouth daily.   melatonin 5 MG TABS Take 5 mg by mouth.   metoprolol succinate (TOPROL-XL) 25 MG 24 hr tablet Take 25 mg by mouth daily.    ondansetron (ZOFRAN) 4 MG tablet Take 1 tablet (4 mg total) by mouth every 8 (eight) hours as needed for nausea  or vomiting.   oxybutynin (DITROPAN) 5 MG tablet TAKE 1 TABLET BY MOUTH  TWICE DAILY (Patient taking differently: daily.)   rosuvastatin (CRESTOR) 5 MG tablet Take 5 mg by mouth daily. Takes 3 times a week   XARELTO 20 MG TABS tablet Take 20 mg by mouth daily.   venetoclax (VENCLEXTA) 100 MG tablet Take 3 tablets (300 mg total) by mouth daily. Take with food. 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   CARDIAC CATHETERIZATION  2014   COLONOSCOPY  2009, 2012    hyperplastic rectal polyp 2012 tubular adenoma of proximal ascending colon 2000 9 repeat exam due to 2017.   COLONOSCOPY WITH PROPOFOL N/A 11/06/2016   Procedure: COLONOSCOPY WITH PROPOFOL;  Surgeon: Robert Bellow, MD;  Location: [facility] ENDOSCOPY;  Service: Endoscopy;  Laterality: N/A;   COLONOSCOPY WITH PROPOFOL N/A 02/27/2019   Procedure: COLONOSCOPY WITH PROPOFOL;   Surgeon: Robert Bellow, MD;  Location: [facility] ENDOSCOPY;  Service: General;  Laterality: N/A;   CORONARY ARTERY [place]   Family History  Problem Relation Age of Onset   Heart disease Mother        Died age 78   Asthma Mother    Lung cancer Father        Died age 68   Heart attack Sister    Tuberculosis Sister        75 years old   Lung cancer Sister        Died in her 20s   Angina Sister    Breast cancer Sister 24   Rheum arthritis Sister    Aneurysm Sister        Brain, died age 58   Angina Sister    CAD Brother        double bypass   Heart disease Brother        Stent placement   Heart disease Brother        stent placement   Cancer Niece        breast cancer niece, pancreatic cancer niece   Breast cancer Niece    Ovarian cancer Neg Hx    Diabetes Neg Hx    Social History   Socioeconomic History   Marital status: Married    Spouse name: Not on file   Number of children: 2   Years of education: In [place]   Highest education level: 10th grade  Occupational History   Occupation: retired  Tobacco Use   Smoking status: Never   Smokeless tobacco: Never  Vaping Use   Vaping Use: Never used  Substance and Sexual Activity   Alcohol use: Yes    Alcohol/week: 0.0 - 7.0 standard drinks of alcohol    Comment: 1 drink a day of wine or cocktail   Drug use: No   Sexual activity: Never  Other Topics Concern   Not on file  Social History Narrative   Not on file   Social Determinants of Health   Financial Resource Strain: Low Risk  (03/27/2022)   Overall Financial Resource Strain (CARDIA)    Difficulty of Paying Living Expenses: Not hard at all  Food Insecurity: No Food Insecurity (03/27/2022)   Hunger Vital Sign    Worried About Running Out of Food in the Last Year: Never true    Ran Out of Food in the Last Year: Never true  Transportation Needs: No Transportation Needs (03/27/2022)   PRAPARE - Radiographer, therapeutic (Medical): No    Lack of Transportation (Non-Medical): No  Physical Activity: Inactive (03/27/2022)   Exercise Vital Sign    Days  of Exercise per Week: 0 days    Minutes of Exercise per Session: 0 min  Stress: Stress Concern Present (03/27/2022)   [place]    Feeling of Stress : Rather much  Social Connections: Moderately Integrated (03/27/2022)   Social Connection and Isolation Panel [NHANES]    Frequency of Communication with Friends and Family: More than three times a week    Frequency of Social Gatherings with Friends and Family: More than three times a week    Attends Religious Services: More than 4 times per year    Active Member of [employer] or Organizations: Yes    Attends Archivist Meetings: More than 4 times per year    Marital Status: Widowed    Tobacco Counseling Counseling given: Not Answered   Clinical Intake:  Pre-visit preparation completed: Yes  Pain : No/denies pain     BMI - recorded: 22.13 Nutritional Status: BMI of 19-24  Normal Nutritional Risks: None Diabetes: No  How often do you need to have someone help you when you read instructions, pamphlets, or other written materials from your doctor or pharmacy?: 1 - Never  Diabetic?no  Interpreter Needed?: No  Information entered by :: B.Joseangel Nettleton,LPN   Activities of Daily Living    03/27/2022    9:03 AM  In your present state of health, do you have any difficulty performing the following activities:  Hearing? 0  Vision? 0  Difficulty concentrating or making decisions? 0  Walking or climbing stairs? 0  Dressing or bathing? 0  Doing errands, shopping? 0  Preparing Food and eating ? N  Using the Toilet? N  In the past six months, have you accidently leaked urine? N  Do you have problems with loss of bowel control? N  Managing your Medications? N  Managing your Finances? N  Housekeeping or managing your  Housekeeping? N    Patient Care Team: Eulas Post, MD as PCP - General (Unknown Physician Specialty) Bary Castilla [place] Gleason, MD (General Surgery) Corey Skains, MD as Consulting Physician (Cardiology) Anell Barr, OD as Consulting Physician (Optometry) Cammie Sickle, MD as Consulting Physician (Internal Medicine)  Indicate any recent Medical Services you may have received from other than [facility] providers in the past year (date may be approximate).     Assessment:   This is a routine wellness examination for [place].  Hearing/Vision screen Hearing Screening - Comments:: Adequate hearing Vision Screening - Comments:: Adequate visions;only readers Dr Graciella Belton  Dietary issues and exercise activities discussed: Current Exercise Habits: Home exercise routine   Goals Addressed             This Visit's Progress    DIET - EAT MORE FRUITS AND VEGETABLES   On track    Increase water intake   On track    Starting 11/17/15, I will increase to 6 glasses of water a day.       Depression Screen    03/27/2022    8:57 AM 03/23/2021    9:14 AM 05/04/2020    1:46 PM 11/03/2019    1:40 PM 09/22/2019   10:06 AM 09/03/2018    2:51 PM 12/03/2016   11:30 AM  PHQ 2/9 Scores  PHQ - 2 Score 0 0 0 0 0 0 0  PHQ- 9 Score   0 2   0    Fall Risk    03/27/2022    8:53 AM 03/23/2021    9:16 AM  05/04/2020    1:45 PM 11/03/2019    1:40 PM 09/22/2019   10:09 AM  Fall Risk   Falls in the past year? 0 0 0 0 0  Number falls in past yr: 0 0 0 0 0  Injury with Fall? 0 0 0 0 0  Risk for fall due to : No Fall Risks No Fall Risks     Follow up Education provided;Falls prevention discussed Falls evaluation completed Falls evaluation completed Falls evaluation completed     FALL RISK PREVENTION PERTAINING TO THE HOME:  Any stairs in or around the home? Yes  If so, are there any without handrails? Yes  Home free of loose throw rugs in walkways, pet beds, electrical cords, etc? Yes   Adequate lighting in your home to reduce risk of falls? Yes   ASSISTIVE DEVICES UTILIZED TO PREVENT FALLS:  Life alert? No  Use of a cane, walker or w/c? No  Grab bars in the bathroom? Yes  Shower chair or bench in shower? Yes  Elevated toilet seat or a handicapped toilet? Yes   Cognitive Function:        03/27/2022    9:05 AM 11/17/2015    2:16 PM  6CIT Screen  What Year? 0 points 0 points  What month? 0 points 0 points  What time? 0 points 0 points  Count back from 20 0 points 0 points  Months in reverse 0 points 0 points  Repeat phrase 0 points 0 points  Total Score 0 points 0 points    Immunizations Immunization History  Administered Date(s) Administered   Fluad Quad(high Dose 65+) 12/01/2018, 01/11/2020, 11/02/2020, 10/11/2021   Influenza, High Dose Seasonal PF 12/10/2014, 11/17/2015, 10/03/2016, 01/06/2018   Moderna Sars-Covid-2 Vaccination 02/10/2019, 03/10/2019, 11/04/2019   PNEUMOCOCCAL CONJUGATE-20 10/11/2021   Pneumococcal Conjugate-13 12/31/2013   Pneumococcal Polysaccharide-23 11/21/2010   Td 10/09/2007, 05/26/2018    TDAP status: Up to date  Flu Vaccine status: Up to date  Pneumococcal vaccine status: Up to date  Covid-19 vaccine status: Completed vaccines  Qualifies for Shingles Vaccine? Yes   Zostavax completed No   Shingrix Completed?: No.    Education has been provided regarding the importance of this vaccine. Patient has been advised to call insurance company to determine out of pocket expense if they have not yet received this vaccine. Advised may also receive vaccine at local pharmacy or Health Dept. Verbalized acceptance and understanding.  Screening Tests Health Maintenance  Topic Date Due   Zoster Vaccines- Shingrix (1 of 2) Never done   COVID-19 Vaccine (4 - 2023-24 season) 09/22/2021   DEXA SCAN  11/16/2025 (Originally 03/23/2008)   Medicare Annual Wellness (AWV)  03/27/2023   DTaP/Tdap/Td (3 - Tdap) 05/25/2028   Pneumonia Vaccine 29+  Years old  Completed   INFLUENZA VACCINE  Completed   HPV VACCINES  Aged Out    Health Maintenance  Health Maintenance Due  Topic Date Due   Zoster Vaccines- Shingrix (1 of 2) Never done   COVID-19 Vaccine (4 - 2023-24 season) 09/22/2021    Colorectal cancer screening: No longer required.   Mammogram status: No longer required due to age.   Lung Cancer Screening: (Low Dose CT Chest recommended if Age 31-80 years, 30 pack-year currently smoking OR have quit w/in 15years.) does not qualify.   Lung Cancer Screening Referral: no  Additional Screening:  Hepatitis C Screening: does not qualify; Completed no  Vision Screening: Recommended annual ophthalmology exams for early detection of glaucoma and  other disorders of the eye. Is the patient up to date with their annual eye exam?  Yes  Who is the provider or what is the name of the office in which the patient attends annual eye exams? Dr Ellin Mayhew in [place] If pt is not established with a provider, would they like to be referred to a provider to establish care? No .   Dental Screening: Recommended annual dental exams for proper oral hygiene  Community Resource Referral / Chronic Care Management: CRR required this visit?  No   CCM required this visit?  No      Plan:     I have personally reviewed and noted the following in the patient's chart:   Medical and social history Use of alcohol, tobacco or illicit drugs  Current medications and supplements including opioid prescriptions. Patient is not currently taking opioid prescriptions. Functional ability and status Nutritional status Physical activity Advanced directives List of other physicians Hospitalizations, surgeries, and ER visits in previous 12 months Vitals Screenings to include cognitive, depression, and falls Referrals and appointments  In addition, I have reviewed and discussed with patient certain preventive protocols, quality metrics, and best practice  recommendations. A written personalized care plan for preventive services as well as general preventive health recommendations were provided to patient.     Roger Shelter, LPN   [id number]   Nurse Notes: pt is states she is "getting through" in grieving the loss of her husband six weeks ago. She has family and friends (also widowers) support which is very helpful. Denies or states she has no needs right now. Pt does request to return to original dose of levothyroxine (0.88 mcg) prior to the now 55mg. She states she felt better;feels this dose not as effective.

## 2022-03-27 NOTE — Patient Instructions (Signed)
Jacqueline Webb , Thank you for taking time to come for your Medicare Wellness Visit. I appreciate your ongoing commitment to your health goals. Please review the following plan we discussed and let me know if I can assist you in the future.   These are the goals we discussed:  Goals      DIET - EAT MORE FRUITS AND VEGETABLES     Increase water intake     Starting 11/17/15, I will increase to 6 glasses of water a day.        This is a list of the screening recommended for you and due dates:  Health Maintenance  Topic Date Due   Zoster (Shingles) Vaccine (1 of 2) Never done   COVID-19 Vaccine (4 - 2023-24 season) 09/22/2021   DEXA scan (bone density measurement)  11/16/2025*   Medicare Annual Wellness Visit  03/27/2023   DTaP/Tdap/Td vaccine (3 - Tdap) 05/25/2028   Pneumonia Vaccine  Completed   Flu Shot  Completed   HPV Vaccine  Aged Out  *Topic was postponed. The date shown is not the original due date.    Advanced directives: no  Conditions/risks identified: none  Next appointment: Follow up in one year for your annual wellness visit 04/01/2023 '@9'$ :15am telephone   Preventive Care 65 Years and Older, Female Preventive care refers to lifestyle choices and visits with your health care provider that can promote health and wellness. What does preventive care include? A yearly physical exam. This is also called an annual well check. Dental exams once or twice a year. Routine eye exams. Ask your health care provider how often you should have your eyes checked. Personal lifestyle choices, including: Daily care of your teeth and gums. Regular physical activity. Eating a healthy diet. Avoiding tobacco and drug use. Limiting alcohol use. Practicing safe sex. Taking low-dose aspirin every day. Taking vitamin and mineral supplements as recommended by your health care provider. What happens during an annual well check? The services and screenings done by your health care provider  during your annual well check will depend on your age, overall health, lifestyle risk factors, and family history of disease. Counseling  Your health care provider may ask you questions about your: Alcohol use. Tobacco use. Drug use. Emotional well-being. Home and relationship well-being. Sexual activity. Eating habits. History of falls. Memory and ability to understand (cognition). Work and work Statistician. Reproductive health. Screening  You may have the following tests or measurements: Height, weight, and BMI. Blood pressure. Lipid and cholesterol levels. These may be checked every 5 years, or more frequently if you are over 2 years old. Skin check. Lung cancer screening. You may have this screening every year starting at age 17 if you have a 30-pack-year history of smoking and currently smoke or have quit within the past 15 years. Fecal occult blood test (FOBT) of the stool. You may have this test every year starting at age 89. Flexible sigmoidoscopy or colonoscopy. You may have a sigmoidoscopy every 5 years or a colonoscopy every 10 years starting at age 23. Hepatitis C blood test. Hepatitis B blood test. Sexually transmitted disease (STD) testing. Diabetes screening. This is done by checking your blood sugar (glucose) after you have not eaten for a while (fasting). You may have this done every 1-3 years. Bone density scan. This is done to screen for osteoporosis. You may have this done starting at age 51. Mammogram. This may be done every 1-2 years. Talk to your health care provider about  how often you should have regular mammograms. Talk with your health care provider about your test results, treatment options, and if necessary, the need for more tests. Vaccines  Your health care provider may recommend certain vaccines, such as: Influenza vaccine. This is recommended every year. Tetanus, diphtheria, and acellular pertussis (Tdap, Td) vaccine. You may need a Td booster every  10 years. Zoster vaccine. You may need this after age 39. Pneumococcal 13-valent conjugate (PCV13) vaccine. One dose is recommended after age 102. Pneumococcal polysaccharide (PPSV23) vaccine. One dose is recommended after age 81. Talk to your health care provider about which screenings and vaccines you need and how often you need them. This information is not intended to replace advice given to you by your health care provider. Make sure you discuss any questions you have with your health care provider. Document Released: 02/04/2015 Document Revised: 09/28/2015 Document Reviewed: 11/09/2014 Elsevier Interactive Patient Education  2017 [employer] Prevention in the Home Falls can cause injuries. They can happen to people of all ages. There are many things you can do to make your home safe and to help prevent falls. What can I do on the outside of my home? Regularly fix the edges of walkways and driveways and fix any cracks. Remove anything that might make you trip as you walk through a door, such as a raised step or threshold. Trim any bushes or trees on the path to your home. Use bright outdoor lighting. Clear any walking paths of anything that might make someone trip, such as rocks or tools. Regularly check to see if handrails are loose or broken. Make sure that both sides of any steps have handrails. Any raised decks and porches should have guardrails on the edges. Have any leaves, snow, or ice cleared regularly. Use sand or salt on walking paths during winter. Clean up any spills in your garage right away. This includes oil or grease spills. What can I do in the bathroom? Use night lights. Install grab bars by the toilet and in the tub and shower. Do not use towel bars as grab bars. Use non-skid mats or decals in the tub or shower. If you need to sit down in the shower, use a plastic, non-slip stool. Keep the floor dry. Clean up any water that spills on the floor as soon as it  happens. Remove soap buildup in the tub or shower regularly. Attach bath mats securely with double-sided non-slip rug tape. Do not have throw rugs and other things on the floor that can make you trip. What can I do in the bedroom? Use night lights. Make sure that you have a light by your bed that is easy to reach. Do not use any sheets or blankets that are too big for your bed. They should not hang down onto the floor. Have a firm chair that has side arms. You can use this for support while you get dressed. Do not have throw rugs and other things on the floor that can make you trip. What can I do in the kitchen? Clean up any spills right away. Avoid walking on wet floors. Keep items that you use a lot in easy-to-reach places. If you need to reach something above you, use a strong step stool that has a grab bar. Keep electrical cords out of the way. Do not use floor polish or wax that makes floors slippery. If you must use wax, use non-skid floor wax. Do not have throw rugs and other  things on the floor that can make you trip. What can I do with my stairs? Do not leave any items on the stairs. Make sure that there are handrails on both sides of the stairs and use them. Fix handrails that are broken or loose. Make sure that handrails are as long as the stairways. Check any carpeting to make sure that it is firmly attached to the stairs. Fix any carpet that is loose or worn. Avoid having throw rugs at the top or bottom of the stairs. If you do have throw rugs, attach them to the floor with carpet tape. Make sure that you have a light switch at the top of the stairs and the bottom of the stairs. If you do not have them, ask someone to add them for you. What else can I do to help prevent falls? Wear shoes that: Do not have high heels. Have rubber bottoms. Are comfortable and fit you well. Are closed at the toe. Do not wear sandals. If you use a stepladder: Make sure that it is fully opened.  Do not climb a closed stepladder. Make sure that both sides of the stepladder are locked into place. Ask someone to hold it for you, if possible. Clearly mark and make sure that you can see: Any grab bars or handrails. First and last steps. Where the edge of each step is. Use tools that help you move around (mobility aids) if they are needed. These include: Canes. Walkers. Scooters. Crutches. Turn on the lights when you go into a dark area. Replace any light bulbs as soon as they burn out. Set up your furniture so you have a clear path. Avoid moving your furniture around. If any of your floors are uneven, fix them. If there are any pets around you, be aware of where they are. Review your medicines with your doctor. Some medicines can make you feel dizzy. This can increase your chance of falling. Ask your doctor what other things that you can do to help prevent falls. This information is not intended to replace advice given to you by your health care provider. Make sure you discuss any questions you have with your health care provider. Document Released: 11/04/2008 Document Revised: 06/16/2015 Document Reviewed: 02/12/2014 Elsevier Interactive Patient Education  2017 [employer].

## 2022-05-29 ENCOUNTER — Other Ambulatory Visit: Payer: Self-pay | Admitting: Family Medicine

## 2022-05-29 ENCOUNTER — Other Ambulatory Visit: Payer: Self-pay

## 2022-05-29 DIAGNOSIS — I10 Essential (primary) hypertension: Secondary | ICD-10-CM

## 2022-05-29 NOTE — Telephone Encounter (Signed)
Requested medication (s) are due for refill today: yes  Requested medication (s) are on the active medication list: yes  Last refill:  02/28/22  Future visit scheduled: yes  Notes to clinic:  Unable to refill per protocol, last refill by another provider no longer at this practice. Routing for review.     Requested Prescriptions  Pending Prescriptions Disp Refills   lisinopril-hydrochlorothiazide (ZESTORETIC) 20-12.5 MG tablet [Pharmacy Med Name: Lisinopril-hydroCHLOROthiazide 20-12.5 MG Oral Tablet] 90 tablet 3    Sig: Take 1 tablet by mouth daily.     Cardiovascular:  ACEI + Diuretic Combos Failed - 05/29/2022  5:22 AM      Failed - Na in normal range and within 180 days    Sodium  Date Value Ref Range Status  11/01/2021 135 135 - 145 mmol/L Final  07/03/2017 139 134 - 144 mmol/L Final  05/11/2014 141 mmol/L Final    Comment:    135-145 NOTE: New Reference Range  03/30/14          Failed - K in normal range and within 180 days    Potassium  Date Value Ref Range Status  11/01/2021 3.5 3.5 - 5.1 mmol/L Final  05/11/2014 3.6 mmol/L Final    Comment:    3.5-5.1 NOTE: New Reference Range  03/30/14          Failed - Cr in normal range and within 180 days    Creatinine  Date Value Ref Range Status  05/11/2014 1.07 (H) mg/dL Final    Comment:    9.60-4.54 NOTE: New Reference Range  03/30/14    Creatinine, Ser  Date Value Ref Range Status  11/01/2021 0.99 0.44 - 1.00 mg/dL Final         Failed - eGFR is 30 or above and within 180 days    EGFR (African American)  Date Value Ref Range Status  05/11/2014 59 (L)  Final   GFR calc Af Amer  Date Value Ref Range Status  08/05/2019 >60 >60 mL/min Final   EGFR (Non-African Amer.)  Date Value Ref Range Status  05/11/2014 51 (L)  Final    Comment:    eGFR values <88mL/min/1.73 m2 may be an indication of chronic kidney disease (CKD). Calculated eGFR is useful in patients with stable renal function. The eGFR  calculation will not be reliable in acutely ill patients when serum creatinine is changing rapidly. It is not useful in patients on dialysis. The eGFR calculation may not be applicable to patients at the low and high extremes of body sizes, pregnant women, and vegetarians.    GFR, Estimated  Date Value Ref Range Status  11/01/2021 57 (L) >60 mL/min Final    Comment:    (NOTE) Calculated using the CKD-EPI Creatinine Equation (2021)          Failed - Last BP in normal range    BP Readings from Last 1 Encounters:  11/01/21 (!) 141/82         Passed - Patient is not pregnant      Passed - Valid encounter within last 6 months    Recent Outpatient Visits           7 months ago Coronary artery disease involving coronary bypass graft of native heart without angina pectoris   [facility] Bosie Clos, [facility]   11 months ago Essential (primary) hypertension   [place] [facility] Bosie Clos, MD   1 year ago Adult hypothyroidism  [facility] Bosie Clos, MD   2 years ago Hypercholesteremia   [facility] Bosie Clos, MD   2 years ago Adult hypothyroidism   [facility], [facility]

## 2022-08-14 ENCOUNTER — Other Ambulatory Visit: Payer: Self-pay | Admitting: Family Medicine

## 2022-08-15 NOTE — Telephone Encounter (Signed)
Requested medication (s) are due for refill today: yes   Requested medication (s) are on the active medication list: yes   Last refill:  02/26/22 #90 3 refills  Future visit scheduled: yes 04/01/23  Notes to clinic:  Rx ordered by Dr. Sullivan Lone, no longer at practice. TOC not scheduled appt until 04/01/23. Do you want to refill Rx? Does patient need earlier appt?     Requested Prescriptions  Pending Prescriptions Disp Refills   levothyroxine (SYNTHROID) 88 MCG tablet [Pharmacy Med Name: Levothyroxine Sodium 88 MCG Oral Tablet] 90 tablet 3    Sig: TAKE 1 TABLET BY MOUTH DAILY  BEFORE BREAKFAST     Endocrinology:  Hypothyroid Agents Passed - 08/14/2022  4:31 AM      Passed - TSH in normal range and within 360 days    TSH  Date Value Ref Range Status  11/01/2021 0.682 0.450 - 4.500 uIU/mL Final         Passed - Valid encounter within last 12 months    Recent Outpatient Visits           10 months ago Coronary artery disease involving coronary bypass graft of native heart without angina pectoris   [place] [facility] Bosie Clos, MD   1 year ago Essential (primary) hypertension   [place] [facility] Bosie Clos, MD   1 year ago Adult hypothyroidism   [place] [facility] Bosie Clos, MD   2 years ago Hypercholesteremia   [facility] Bosie Clos, MD   2 years ago Adult hypothyroidism   [facility] Bosie Clos, MD

## 2022-08-16 ENCOUNTER — Other Ambulatory Visit: Payer: Self-pay

## 2022-08-16 ENCOUNTER — Telehealth: Payer: Self-pay | Admitting: Family Medicine

## 2022-08-16 DIAGNOSIS — N3941 Urge incontinence: Secondary | ICD-10-CM

## 2022-08-16 MED ORDER — OXYBUTYNIN CHLORIDE 5 MG PO TABS: 5.0000 mg | ORAL_TABLET | Freq: Two times a day (BID) | ORAL | 0 refills | Status: DC

## 2022-08-16 NOTE — Telephone Encounter (Signed)
[facility] faxed refill request for the following medications:   oxybutynin (DITROPAN) 5 MG tablet   Please advise.

## 2022-08-20 ENCOUNTER — Other Ambulatory Visit: Payer: Self-pay | Admitting: Family Medicine

## 2022-08-20 DIAGNOSIS — Z1231 Encounter for screening mammogram for malignant neoplasm of breast: Secondary | ICD-10-CM

## 2022-09-06 ENCOUNTER — Inpatient Hospital Stay: Payer: Medicare Other

## 2022-09-06 ENCOUNTER — Encounter: Payer: Self-pay | Admitting: Internal Medicine

## 2022-09-06 ENCOUNTER — Ambulatory Visit
Admission: RE | Admit: 2022-09-06 | Discharge: 2022-09-06 | Disposition: A | Discharge status: Home / Self Care | Payer: Medicare Other | Source: Ambulatory Visit | Attending: Family Medicine | Admitting: Family Medicine

## 2022-09-06 ENCOUNTER — Inpatient Hospital Stay: Payer: Medicare Other | Attending: Internal Medicine | Admitting: Internal Medicine

## 2022-09-06 VITALS — BP 137/77 | HR 55 | Temp 96.7°F | Resp 18 | Ht 65.0 in | Wt 134.0 lb

## 2022-09-06 DIAGNOSIS — Z1231 Encounter for screening mammogram for malignant neoplasm of breast: Secondary | ICD-10-CM | POA: Diagnosis present

## 2022-09-06 DIAGNOSIS — Z79899 Other long term (current) drug therapy: Secondary | ICD-10-CM | POA: Diagnosis not present

## 2022-09-06 DIAGNOSIS — C9110 Chronic lymphocytic leukemia of B-cell type not having achieved remission: Secondary | ICD-10-CM | POA: Diagnosis not present

## 2022-09-06 DIAGNOSIS — Z7969 Long term (current) use of other immunomodulators and immunosuppressants: Secondary | ICD-10-CM | POA: Insufficient documentation

## 2022-09-06 DIAGNOSIS — Z7901 Long term (current) use of anticoagulants: Secondary | ICD-10-CM | POA: Insufficient documentation

## 2022-09-06 DIAGNOSIS — C8308 Small cell B-cell lymphoma, lymph nodes of multiple sites: Secondary | ICD-10-CM

## 2022-09-06 DIAGNOSIS — I4891 Unspecified atrial fibrillation: Secondary | ICD-10-CM | POA: Insufficient documentation

## 2022-09-06 DIAGNOSIS — D696 Thrombocytopenia, unspecified: Secondary | ICD-10-CM | POA: Insufficient documentation

## 2022-09-06 DIAGNOSIS — Z8572 Personal history of non-Hodgkin lymphomas: Secondary | ICD-10-CM | POA: Diagnosis present

## 2022-09-06 LAB — COMPREHENSIVE METABOLIC PANEL
ALT: 24 U/L (ref 0–44)
AST: 26 U/L (ref 15–41)
Albumin: 4.2 g/dL (ref 3.5–5.0)
Alkaline Phosphatase: 74 U/L (ref 38–126)
Anion gap: 8 (ref 5–15)
BUN: 17 mg/dL (ref 8–23)
CO2: 27 mmol/L (ref 22–32)
Calcium: 9.4 mg/dL (ref 8.9–10.3)
Chloride: 101 mmol/L (ref 98–111)
Creatinine, Ser: 0.98 mg/dL (ref 0.44–1.00)
GFR, Estimated: 57 mL/min — ABNORMAL LOW (ref >60)
Glucose, Bld: 105 mg/dL — ABNORMAL HIGH (ref 70–99)
Potassium: 3.8 mmol/L (ref 3.5–5.1)
Sodium: 136 mmol/L (ref 135–145)
Total Bilirubin: 0.9 mg/dL (ref 0.3–1.2)
Total Protein: 6.8 g/dL (ref 6.5–8.1)

## 2022-09-06 LAB — CBC WITH DIFFERENTIAL/PLATELET
Abs Immature Granulocytes: 0.01 10*3/uL (ref 0.00–0.07)
Basophils Absolute: 0 10*3/uL (ref 0.0–0.1)
Basophils Relative: 1 %
Eosinophils Absolute: 0.1 10*3/uL (ref 0.0–0.5)
Eosinophils Relative: 3 %
HCT: 39.6 % (ref 36.0–46.0)
Hemoglobin: 13.1 g/dL (ref 12.0–15.0)
Immature Granulocytes: 0 %
Lymphocytes Relative: 27 %
Lymphs Abs: 1 10*3/uL (ref 0.7–4.0)
MCH: 28.9 pg (ref 26.0–34.0)
MCHC: 33.1 g/dL (ref 30.0–36.0)
MCV: 87.4 fL (ref 80.0–100.0)
Monocytes Absolute: 0.5 10*3/uL (ref 0.1–1.0)
Monocytes Relative: 13 %
Neutro Abs: 2 10*3/uL (ref 1.7–7.7)
Neutrophils Relative %: 56 %
Platelets: 72 10*3/uL — ABNORMAL LOW (ref 150–400)
RBC: 4.53 MIL/uL (ref 3.87–5.11)
RDW: 15.3 % (ref 11.5–15.5)
WBC: 3.6 10*3/uL — ABNORMAL LOW (ref 4.0–10.5)
nRBC: 0 % (ref 0.0–0.2)

## 2022-09-06 LAB — LACTATE DEHYDROGENASE: LDH: 158 U/L (ref 98–192)

## 2022-09-06 NOTE — Progress Notes (Signed)
Pt may feel a lump or 2 in her groin area. Not much of an appetite. No fevers. Occ night sweats. Denies any pain.

## 2022-09-06 NOTE — Progress Notes (Signed)
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 Lymphoma low-grade, status Rituxan therapy. 2. Recurrent disease by clinical examination in December of 2012 3. Ovarian mass on PET scan in December of 2012 (normal CA 125). Ovarian mass has resolved after rituximab therapy. 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  Patient had total 5 cycles of chemotherapy with bendamustine and rituximab and 6 cycle was omitted because of significant side effects with and weakness and fatigue.  # OCT 2016- PET significant response; ON surveillance  # 18th OCT 2018- Gazyva;SEP 2018-/OCT 2018 CT scan- progression; s/p 6 cycles [#6 in April 2019]  # JAN 2020-CT scan shows progressive disease; start a acalabrutinib 100mg  BID;# feb 6th2020- decrease dose of acalbrutinib to 100 mg/day [sec to spon bruising] March 1st week- STOPPED Acala sec to spon brusing  # MARCH 17th 2020- Start Venetoclax [out pt ramp up]; 300 mg a day   # 11/06/2016- colonoscopy [Dr.Byrnett- 2 polyps]  #History of A. Fib-Xarelto; CKD stage  II-III.   # #Right posterior neck melanoma stage I [ Dr.Lee/Elkins]..   -------------------------------------------------------------------    # left ? Adnexal mass- ? Etiology; mild SUV uptake incidental [stable compared to previous PET in 2016] Previous workup 2015- vaginal ultrasound negative. MRI February 2018- approximately 3 cm in size; slow increase in the size of the left adnexal mass over at least 3 years- suspect involvement of lymphoma rather than a primary gynecologic malignancy. --------------------------------------------------------------   DIAGNOSIS: SMALL LYMPHOCYTIC LEUKEMIA  STAGE:  IV    ;GOALS: control/pallaitive  CURRENT/MOST RECENT THERAPY: Venatoclax.    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  Jacqueline Webb 82 y.o.  female pleasant patient above history of SLL/CLL most recently on venetoclax; also on Eliquis for A-fib is here for follow-up.  Pt may feel a lump or 2 in her groin area. Not much of an appetite. No fevers. Occ night sweats. Denies any pain.   No abdominal pain nausea vomiting.  Chronic mild lower extremity swelling on Lasix as needed. No diarrhea no bleeding.  No fever no chills.  No nausea no vomiting.  Review of Systems  Constitutional:  Positive for malaise/fatigue and weight loss. Negative for chills, diaphoresis and fever.  HENT:  Negative for nosebleeds and sore throat.   Eyes:  Negative for double vision.  Respiratory:  Negative for cough, hemoptysis, sputum production, shortness of breath and wheezing.   Cardiovascular:  Positive for leg  swelling. Negative for chest pain, palpitations and orthopnea.  Gastrointestinal:  Negative for abdominal pain, blood in stool,  constipation, diarrhea, heartburn, melena and vomiting.  Genitourinary:  Negative for dysuria, frequency and urgency.  Musculoskeletal:  Positive for back pain and joint pain.  Neurological:  Negative for dizziness, tingling, focal weakness, weakness and headaches.  Psychiatric/Behavioral:  Negative for depression. The patient is not nervous/anxious and does not have insomnia.     PAST MEDICAL HISTORY :  Past Medical History:  Diagnosis Date   A-fib [facility])    Benign neoplasm of rectum and anal canal    Chronic leukemia of unspecified cell type, without mention of having achieved remission    Dysrhythmia    A-Fib   Hypercholesteremia    Hypertension    Hyperthyroidism    Lymphoma (HCC) 2007   Non Hodgkin's lymphoma (HCC) 01/22/2006   Personal history of chemotherapy     PAST SURGICAL HISTORY :   Past Surgical History:  Procedure Laterality Date   ABDOMINAL HYSTERECTOMY  1958   APPENDECTOMY     BREAST BIOPSY Right 1970   right breast biopsy with clip- neg   BREAST BIOPSY Left 2002   left breast biopsy with clip-neg   BREAST BIOPSY Right 2014   right breast biopsy with clip-neg   BREAST CYST ASPIRATION Bilateral 2000   bilateral fine needle aspiration   BREAST SURGERY Left 1990   biopsy   BREAST SURGERY Right May 08, 2012   complex fibroadenoma without malignancy.   CARDIAC CATHETERIZATION  2014   COLONOSCOPY  2009, 2012    hyperplastic rectal polyp 2012 tubular adenoma of proximal ascending colon 2000 9 repeat exam due to 2017.   COLONOSCOPY WITH PROPOFOL N/A 11/06/2016   Procedure: COLONOSCOPY WITH PROPOFOL;  Surgeon: [name], MD;  Location: [facility] ENDOSCOPY;  Service: Endoscopy;  Laterality: N/A;   COLONOSCOPY WITH PROPOFOL N/A 02/27/2019   Procedure: COLONOSCOPY WITH PROPOFOL;  Surgeon: [name], MD;  Location: [facility] ENDOSCOPY;  Service: General;  Laterality: N/A;   CORONARY ARTERY BYPASS GRAFT  1999   FLEXIBLE SIGMOIDOSCOPY  1998    FAMILY HISTORY  :   Family History  Problem Relation Age of Onset   Heart disease Mother        Died age 32   Asthma Mother    Lung cancer Father        Died age 58   Heart attack Sister    Tuberculosis Sister        14 years old   Lung cancer Sister        Died in her 19s   Angina Sister    Breast cancer Sister 44   Rheum arthritis Sister    Aneurysm Sister        Brain, died age 47   Angina Sister    CAD Brother        double bypass   Heart disease Brother        Stent placement   Heart disease Brother        stent placement   Cancer Niece        breast cancer niece, pancreatic cancer niece   Breast cancer Niece    Ovarian cancer Neg Hx    Diabetes Neg Hx     SOCIAL HISTORY:   Social History   Tobacco Use   Smoking status: Never   Smokeless tobacco: Never  Vaping Use   Vaping status: Never Used  Substance Use Topics  Alcohol use: Yes    Alcohol/week: 0.0 - 7.0 standard drinks of alcohol    Comment: 1 drink a day of wine or cocktail   Drug use: No    ALLERGIES:  is allergic to pravastatin.  MEDICATIONS:  Current Outpatient Medications  Medication Sig Dispense Refill   ezetimibe (ZETIA) 10 MG tablet Take 1 tablet (10 mg total) daily by mouth. 30 tablet 12   furosemide (LASIX) 20 MG tablet Take 1 tablet (20 mg total) by mouth every morning. 30 tablet 5   isosorbide mononitrate (IMDUR) 30 MG 24 hr tablet Take 30 mg by mouth daily.     levothyroxine (SYNTHROID) 50 MCG tablet Take 50 mcg by mouth daily before breakfast.     lisinopril-hydrochlorothiazide (ZESTORETIC) 20-12.5 MG tablet TAKE 1 TABLET BY MOUTH DAILY 30 tablet 0   metoprolol succinate (TOPROL-XL) 25 MG 24 hr tablet Take 25 mg by mouth daily.      oxybutynin (DITROPAN) 5 MG tablet Take 1 tablet (5 mg total) by mouth 2 (two) times daily. 180 tablet 0   rosuvastatin (CRESTOR) 5 MG tablet Take 5 mg by mouth daily. Takes 3 times a week     XARELTO 20 MG TABS tablet Take 20 mg by mouth daily.     Docusate Calcium  (STOOL SOFTENER PO) Take by mouth as needed. (Patient not taking: Reported on 09/06/2022)     melatonin 5 MG TABS Take 5 mg by mouth. (Patient not taking: Reported on 09/06/2022)     ondansetron (ZOFRAN) 4 MG tablet Take 1 tablet (4 mg total) by mouth every 8 (eight) hours as needed for nausea or vomiting. (Patient not taking: Reported on 09/06/2022) 40 tablet 3   venetoclax (VENCLEXTA) 100 MG tablet Take 3 tablets (300 mg total) by mouth daily. Take with food. (Patient not taking: Reported on 03/27/2022) 90 tablet 0   No current facility-administered medications for this visit.   Facility-Administered Medications Ordered in Other Visits  Medication Dose Route Frequency Provider Last Rate Last Admin   heparin lock flush 100 unit/mL  500 Units Intravenous Once Louretta Shorten R, MD       sodium chloride flush (NS) 0.9 % injection 10 mL  10 mL Intravenous PRN Earna Coder, MD        PHYSICAL EXAMINATION: ECOG PERFORMANCE STATUS: 1 - Symptomatic but completely ambulatory  BP 137/77 (BP Location: Left Arm, Patient Position: Sitting)   Pulse (!) 55   Temp (!) 96.7 F (35.9 C) (Tympanic)   Resp 18   Ht 5\' 5"  (1.651 m)   Wt 134 lb (60.8 kg)   SpO2 98%   BMI 22.30 kg/m   Filed Weights   09/06/22 0939  Weight: 134 lb (60.8 kg)    1 cm lymph nodes Bil Ax; neck; Bil groin  Physical Exam HENT:     Head: Normocephalic and atraumatic.     Mouth/Throat:     Pharynx: No oropharyngeal exudate.  Eyes:     Pupils: Pupils are equal, round, and reactive to light.  Cardiovascular:     Rate and Rhythm: Normal rate and regular rhythm.  Pulmonary:     Effort: No respiratory distress.     Breath sounds: No wheezing.  Abdominal:     General: Bowel sounds are normal. There is no distension.     Palpations: Abdomen is soft. There is no mass.     Tenderness: There is no abdominal tenderness. There is no guarding or rebound.  Musculoskeletal:  General: No tenderness. Normal range of  motion.     Cervical back: Normal range of motion and neck supple.  Skin:    General: Skin is warm.  Neurological:     Mental Status: She is alert and oriented to person, place, and time.  Psychiatric:        Mood and Affect: Affect normal.     LABORATORY DATA:  I have reviewed the data as listed    Component Value Date/Time   NA 136 09/06/2022 0902   NA 139 07/03/2017 1425   NA 141 05/11/2014 0853   K 3.8 09/06/2022 0902   K 3.6 05/11/2014 0853   CL 101 09/06/2022 0902   CL 103 05/11/2014 0853   CO2 27 09/06/2022 0902   CO2 29 05/11/2014 0853   GLUCOSE 105 (H) 09/06/2022 0902   GLUCOSE 107 (H) 05/11/2014 0853   BUN 17 09/06/2022 0902   BUN 19 07/03/2017 1425   BUN 22 (H) 05/11/2014 0853   CREATININE 0.98 09/06/2022 0902   CREATININE 1.07 (H) 05/11/2014 0853   CALCIUM 9.4 09/06/2022 0902   CALCIUM 10.1 05/11/2014 0853   PROT 6.8 09/06/2022 0902   PROT 6.4 07/03/2017 1425   PROT 7.3 05/11/2014 0853   ALBUMIN 4.2 09/06/2022 0902   ALBUMIN 4.3 07/03/2017 1425   ALBUMIN 4.7 05/11/2014 0853   AST 26 09/06/2022 0902   AST 29 05/11/2014 0853   ALT 24 09/06/2022 0902   ALT 20 05/11/2014 0853   ALKPHOS 74 09/06/2022 0902   ALKPHOS 47 05/11/2014 0853   BILITOT 0.9 09/06/2022 0902   BILITOT 0.5 07/03/2017 1425   BILITOT 1.0 05/11/2014 0853   GFRNONAA 57 (L) 09/06/2022 0902   GFRNONAA 51 (L) 05/11/2014 0853   GFRAA >60 08/05/2019 0941   GFRAA 59 (L) 05/11/2014 0853    No results found for: "SPEP", "UPEP"  Lab Results  Component Value Date   WBC 3.6 (L) 09/06/2022   NEUTROABS 2.0 09/06/2022   HGB 13.1 09/06/2022   HCT 39.6 09/06/2022   MCV 87.4 09/06/2022   PLT 72 (L) 09/06/2022      Chemistry      Component Value Date/Time   NA 136 09/06/2022 0902   NA 139 07/03/2017 1425   NA 141 05/11/2014 0853   K 3.8 09/06/2022 0902   K 3.6 05/11/2014 0853   CL 101 09/06/2022 0902   CL 103 05/11/2014 0853   CO2 27 09/06/2022 0902   CO2 29 05/11/2014 0853   BUN 17  09/06/2022 0902   BUN 19 07/03/2017 1425   BUN 22 (H) 05/11/2014 0853   CREATININE 0.98 09/06/2022 0902   CREATININE 1.07 (H) 05/11/2014 0853   GLU 98 05/25/2013 0000      Component Value Date/Time   CALCIUM 9.4 09/06/2022 0902   CALCIUM 10.1 05/11/2014 0853   ALKPHOS 74 09/06/2022 0902   ALKPHOS 47 05/11/2014 0853   AST 26 09/06/2022 0902   AST 29 05/11/2014 0853   ALT 24 09/06/2022 0902   ALT 20 05/11/2014 0853   BILITOT 0.9 09/06/2022 0902   BILITOT 0.5 07/03/2017 1425   BILITOT 1.0 05/11/2014 0853       RADIOGRAPHIC STUDIES: I have personally reviewed the radiological images as listed and agreed with the findings in the report. No results found.   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 SEP 27th, 2023-- no findings suggestive of active lymphoma- stable.  But noted to clinically,  worsening of 1 cm lymph nodes Bil Ax; neck; Bil groin; REPEAT CT scan-and will decide on next options.    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    #Cardiac :A. Fib-stable continue Xarelto [Dr.Kowalski].reviewed the cardiomegaly as noted on CT scan[not the best test] stable.   # Bilateral lower extremity swelling-on Lasix-improved defer to Dr.Gilbert re: continued lasix.  Stable.   #Longstanding adnexal mass on the LEFT increasing in size slowly over time as compared to more remote imaging from 2018. 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 Await repeat CT scan; and then consider gyn-Onc referral again.   Will call with results of CT CAPN # DISPOSITION: # CT CAP;Neck- asap # follow up TBD- Dr.B  # I reviewed the blood work- with the patient in detail; also reviewed the imaging independently [as summarized above]; and with the  patient in detail.       Orders Placed This Encounter  Procedures   CT CHEST ABDOMEN PELVIS W CONTRAST    Standing Status:   Future    Standing Expiration Date:   09/06/2023    Order Specific Question:   If indicated for the ordered procedure, I authorize the administration of contrast media per Radiology protocol    Answer:   Yes    Order Specific Question:   Does the patient have a contrast media/X-ray dye allergy?    Answer:   No    Order Specific Question:   Preferred imaging location?    Answer:   [facility]    Order Specific Question:   If indicated for the ordered procedure, I authorize the administration of oral contrast media per Radiology protocol    Answer:   Yes   CT SOFT TISSUE NECK W CONTRAST    Standing Status:   Future    Standing Expiration Date:   09/06/2023    Order Specific Question:   If indicated for the ordered procedure, I authorize the administration of contrast media per Radiology protocol    Answer:   Yes    Order Specific Question:   Preferred imaging location?    Answer:   [facility]    All questions were answered. The patient knows to call the clinic with any problems, questions or concerns.      Earna Coder, MD 09/06/2022 10:23 AM

## 2022-09-06 NOTE — Assessment & Plan Note (Addendum)
#   Small B-cell lymphocytic lymphoma low-grade- stage IV April 2022-currently OFF  venetoclax 300 mg a day [finished DEC 2023]. SEP 27th, 2023-- no findings suggestive of active lymphoma- stable.  But noted to clinically,  worsening of 1 cm lymph nodes Bil Ax; neck; Bil groin; REPEAT CT scan-and will decide on next options.    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    #Cardiac :A. Fib-stable continue Xarelto [Dr.Kowalski].reviewed the cardiomegaly as noted on CT scan[not the best test] stable.   # Bilateral lower extremity swelling-on Lasix-improved defer to Dr.Gilbert re: continued lasix.  Stable.   #Longstanding adnexal mass on the LEFT increasing in size slowly over time as compared to more remote imaging from 2018. 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 Await repeat CT scan; and then consider gyn-Onc referral again.   Will call with results of CT CAPN # DISPOSITION: # CT CAP;Neck- asap # follow up TBD- Dr.B  # I reviewed the blood work- with the patient in detail; also reviewed the imaging independently [as summarized above]; and with the patient in detail.

## 2022-09-07 LAB — THYROID PANEL WITH TSH
Free Thyroxine Index: 2.3 (ref 1.2–4.9)
T3 Uptake Ratio: 30 % (ref 24–39)
T4, Total: 7.8 ug/dL (ref 4.5–12.0)
TSH: 3.28 u[IU]/mL (ref 0.450–4.500)

## 2022-09-11 ENCOUNTER — Other Ambulatory Visit: Payer: Medicare Other

## 2022-09-21 ENCOUNTER — Ambulatory Visit: Payer: Medicare Other

## 2022-09-21 ENCOUNTER — Ambulatory Visit
Admission: RE | Admit: 2022-09-21 | Discharge: 2022-09-21 | Disposition: A | Discharge status: Home / Self Care | Payer: Medicare Other | Source: Ambulatory Visit | Attending: Internal Medicine | Admitting: Internal Medicine

## 2022-09-21 DIAGNOSIS — C8308 Small cell B-cell lymphoma, lymph nodes of multiple sites: Secondary | ICD-10-CM | POA: Diagnosis present

## 2022-09-21 MED ORDER — IOHEXOL 300 MG/ML  SOLN
100.0000 mL | Freq: Once | INTRAMUSCULAR | Status: AC | PRN
  Administered 2022-09-21: 100 mL via INTRAVENOUS

## 2022-09-28 ENCOUNTER — Encounter: Payer: Self-pay | Admitting: Internal Medicine

## 2022-09-28 ENCOUNTER — Telehealth: Payer: Self-pay | Admitting: Pharmacist

## 2022-09-28 ENCOUNTER — Telehealth: Payer: Self-pay

## 2022-09-28 ENCOUNTER — Other Ambulatory Visit (HOSPITAL_COMMUNITY): Payer: Self-pay

## 2022-09-28 DIAGNOSIS — C8308 Small cell B-cell lymphoma, lymph nodes of multiple sites: Secondary | ICD-10-CM

## 2022-09-28 NOTE — Telephone Encounter (Addendum)
Oral Oncology Patient Advocate Encounter  Prior Authorization for Brukinsa has been approved.    PA# [id number]  Effective dates: 09/28/22 through 01/22/23  Patients co-pay is $2,319.73.   Obtained [employer] to make co-pay $0.00.   Ardeen Fillers, CPhT Oncology Pharmacy Patient Advocate  [facility]  [phone] (phone) [phone] (fax) 09/28/2022 1:30 PM

## 2022-09-28 NOTE — Telephone Encounter (Signed)
Oral Oncology Patient Advocate Encounter  Was successful in securing patient a $8,000.00 grant from [employer] to provide copayment coverage for Brukinsa.  This will keep the out of pocket expense at $0.     [employer] ID: [id number]   The billing information is as follows and has been shared with [facility].    RxBin: [id number] PCN: [id number] Member ID: [id number] Group ID: [id number] Dates of Eligibility: 08/29/22 through 08/28/23  Fund:  Chronic Lymphocytic Leukemia   Ardeen Fillers, CPhT Oncology Pharmacy Patient Advocate  [facility]  [phone] (phone) [phone] (fax) 09/28/2022 1:56 PM

## 2022-09-28 NOTE — Telephone Encounter (Signed)
Oral Oncology Patient Advocate Encounter  New authorization   Received notification that prior authorization for Brukinsa is required.   PA submitted on 09/28/22  Key [id number]  Status is pending     Ardeen Fillers, CPhT Oncology Pharmacy Patient Advocate  [facility]  [phone] (phone) [phone] (fax) 09/28/2022 12:47 PM

## 2022-09-28 NOTE — Telephone Encounter (Incomplete)
Clinical Pharmacist Encounter   Received new prescription for Brukinsa (zanubrutinib) for the treatment of recently progressed by CT stage IV small B-cell lymphocytic leukemia (CD5+, CD23+, kappa light chain 64%, lymphocytes 81%), Planned duration until disease progression or intolerable toxicities.  Labs from 09/06/2022 assessed, currently no parameters met to delay, stop or dose reduce the current treatment plan. Prescription dose and frequency assessed and appropriate.   Current medication list in Epic reviewed, 1 DDIs with Brukinsa (zanubrutinib) identified. Eliquis can add to the increase bleeding risk. Reduced dose from 320 mg daily to 240 mg daily.   Evaluated chart and no patient barriers to medication adherence identified.   Prescription has been e-scribed to the [facility] [employer] [facility] for benefits analysis and approval.  Oral Oncology Clinic will continue to follow for insurance authorization, copayment issues, initial counseling and start date.  Patient agreed to treatment on 09/28/2022 per MD documentation.  Gara Kroner, PharmD PGY1 Pharmacy Resident - [facility] [phone]  09/28/2022 2:57 PM

## 2022-09-28 NOTE — Progress Notes (Signed)
Discussed with the patient regarding need to start new therapy given progressive disease on the CAT scan.    Recommend starting Zanubrutinib.  Discussed with pharmacy.  Recommend follow-up in couple of in third week of November-MD labs CBC CMP LDH.

## 2022-10-01 ENCOUNTER — Other Ambulatory Visit: Payer: Self-pay

## 2022-10-01 ENCOUNTER — Other Ambulatory Visit (HOSPITAL_COMMUNITY): Payer: Self-pay

## 2022-10-01 MED ORDER — BRUKINSA 80 MG PO CAPS
240.0000 mg | ORAL_CAPSULE | Freq: Every day | ORAL | 0 refills | Status: DC
Start: 2022-10-01 — End: 2022-10-17
  Filled 2022-10-01: qty 120, 40d supply, fill #0
  Filled 2022-10-01: qty 90, 30d supply, fill #0

## 2022-10-01 NOTE — Telephone Encounter (Signed)
Patient successfully OnBoarded and drug education provided by pharmacist. Medication scheduled to be shipped on Tuesday, 10/02/22 for delivery on Wednesday, 10/03/22 from [facility] to patient's address. Patient also knows to call me at [phone] with any questions or concerns regarding receiving medication or if there is any unexpected change in co-pay.    Ardeen Fillers, CPhT Oncology Pharmacy Patient Advocate  [facility]  [phone] (phone) [phone] (fax) 10/01/2022 1:41 PM

## 2022-10-01 NOTE — Telephone Encounter (Signed)
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   Counseled patient on administration, dosing, side effects, monitoring, drug-food interactions, safe handling, storage, and disposal. Patient will take Brukinsa (zanubrutinib) 240 mg daily. Dose reduced from the standard 360 mg daily due to also taking Eliquis (increased bleeding risk).   Side effects include but not limited to: rash and itchy skin, muscle or joint pain/weakness, fatigue, and increased risk of bleeding.  Rash and itchy skin: Advised the patient to contact their MD if they develop a rash Muscle or Joint pain: Advised the patient that this can start within the first few months of therapy. If they experience pain to contact their provider about options to help manage it.  Fatigue: Advised the patient if they experience fatigue while using this medication to avoid driving.  Increased risk of bleeding: Advised patient to follow normal precautions as they do while on Eliquis and to monitor for s/sx of bleeding : bruising, uncontrolled bleeding, and to seek immediate medical addition if they have signs of controled bleeding or a fall that results in hitting their head.   Reviewed with patient importance of keeping a medication schedule and plan for any missed doses.  After discussion with patient no patient barriers to medication adherence identified.   Ms. Janee Morn voiced understanding and appreciation. All questions answered. Medication handout provided.  Provided patient with Oral Chemotherapy Navigation Clinic phone number. Patient knows to call the office with questions or concerns. Oral Chemotherapy Navigation Clinic will continue to follow.  Gara Kroner, PharmD PGY1 Pharmacy Resident  - [facility] [phone]  10/01/2022 1:49 PM

## 2022-10-02 ENCOUNTER — Other Ambulatory Visit (HOSPITAL_COMMUNITY): Payer: Self-pay

## 2022-10-17 ENCOUNTER — Other Ambulatory Visit: Payer: Self-pay

## 2022-10-17 ENCOUNTER — Other Ambulatory Visit: Payer: Self-pay | Admitting: Internal Medicine

## 2022-10-17 DIAGNOSIS — C8308 Small cell B-cell lymphoma, lymph nodes of multiple sites: Secondary | ICD-10-CM

## 2022-10-17 MED ORDER — BRUKINSA 80 MG PO CAPS
240.0000 mg | ORAL_CAPSULE | Freq: Every day | ORAL | 0 refills | Status: DC
Start: 2022-10-17 — End: 2022-11-15
  Filled 2022-10-17: qty 90, 30d supply, fill #0

## 2022-10-17 NOTE — Progress Notes (Signed)
Specialty Pharmacy Refill Coordination Note  Jacqueline Webb is a 82 y.o. female contacted today regarding refills of specialty medication(s) Zanubrutinib .  Patient requested Delivery  on 10/30/22  to verified address [address] [place], [place] [address]   Medication will be filled on 10/29/2022.

## 2022-10-17 NOTE — Progress Notes (Signed)
Specialty Pharmacy Ongoing Clinical Assessment Note  Jacqueline Webb is a 82 y.o. female who is being followed by the specialty pharmacy service for RxSp Oncology   Review of patient's specialty medication(s) Zanubrutinib  occurred today.   Patient has missed 0  doses in the last 4 weeks.   Patient did not have any additional questions or concerns.   Therapeutic benefit summary: Patient is achieving benefit   Adverse events/side effects summary: Experienced adverse events/side effects (Nausea) Counseled patient to reach out to provider to discuss nausea.   Patient's therapy is appropriate to : Continue   Goals Slow Disease Progression   Patient is on track . Patient will maintain adherence       Follow up:  6 months

## 2022-10-22 ENCOUNTER — Other Ambulatory Visit: Payer: Self-pay

## 2022-10-29 ENCOUNTER — Telehealth: Payer: Self-pay | Admitting: Family Medicine

## 2022-10-29 NOTE — Telephone Encounter (Signed)
Lesha with [employer] called needing clarification on the Levothyroxine .  She said they are changing the manufacture and will need an approval.  Ref #  [id number]  CB#  [phone]

## 2022-10-30 NOTE — Telephone Encounter (Signed)
Not a patient at [facility] 

## 2022-11-05 ENCOUNTER — Telehealth: Payer: Self-pay | Admitting: *Deleted

## 2022-11-05 NOTE — Telephone Encounter (Signed)
Spoke with patient. Virtual visit arranged for Friday 10.18 at 830 am. (Pt's preference). She will go tomorrow at [facility] to get her labs drawn.

## 2022-11-05 NOTE — Telephone Encounter (Signed)
Orders for cbc/metc/ldh faxed to [facility] in [place]

## 2022-11-05 NOTE — Telephone Encounter (Signed)
Patient returned call for [facility] and said the [facility] number in [place] is [phone]. I called and asked for the fax # and it is [phone]

## 2022-11-05 NOTE — Telephone Encounter (Signed)
I received a fax confirmation for [facility]. I attempted to reach pt to set up an time for her mychart visit with Sharia Reeve, NP later this week. No answer. Vm box not set up.

## 2022-11-05 NOTE — Telephone Encounter (Signed)
Patient called reporting that since yesterday her back is hurting having difficulty bending over and that for 3 days she has had a UTI being treated by PCP. She states that since last evening her left hip and knee are hurting an very painful and that she also has pain in her right hip. She just started her secund month of Brukinsa yesterday. She reports that her bruising is terrible. Her next appointment is 12/09/21. Her last platelet count was drawn 8/15 and was 72. Pease advise.

## 2022-11-06 ENCOUNTER — Encounter: Payer: Self-pay | Admitting: Internal Medicine

## 2022-11-07 ENCOUNTER — Encounter: Payer: Self-pay | Admitting: Hospice and Palliative Medicine

## 2022-11-09 ENCOUNTER — Inpatient Hospital Stay: Payer: Medicare Other | Attending: Internal Medicine | Admitting: Hospice and Palliative Medicine

## 2022-11-09 DIAGNOSIS — N390 Urinary tract infection, site not specified: Secondary | ICD-10-CM

## 2022-11-09 DIAGNOSIS — C8308 Small cell B-cell lymphoma, lymph nodes of multiple sites: Secondary | ICD-10-CM

## 2022-11-09 DIAGNOSIS — R110 Nausea: Secondary | ICD-10-CM

## 2022-11-09 MED ORDER — ONDANSETRON HCL 4 MG PO TABS: 4.0000 mg | ORAL_TABLET | Freq: Three times a day (TID) | ORAL | 3 refills | Status: AC | PRN

## 2022-11-09 NOTE — Progress Notes (Signed)
Virtual Visit via Video Note  I connected with Jacqueline Webb on 11/09/22 at  8:30 AM EDT by a video enabled telemedicine application and verified that I am speaking with the correct person using two identifiers.  Location: Patient: Home Provider: Clinic   I discussed the limitations of evaluation and management by telemedicine and the availability of in person appointments. The patient expressed understanding and agreed to proceed.  History of Present Illness: Jacqueline Webb is an 82 yo woman with multiple medical problems including low-grade, stage IV small B-cell lymphoma with recent findings of progressive disease on CT.  Patient was started on Brukinsa 1 month ago.   Observations/Objective: Telephone visit due to difficulty with connection.  Patient says that she was tolerating Brukinsa well until about a week ago when she started having easy bruising and joint pains.  Had some urinary symptoms with burning, urgency, frequency.  Has a history of UTIs and PCP had prescribed her a course of nitrofurantoin, which patient has been taking for 6 days.  Denies fever but has had some chills.  Today, patient says that she has felt the best she has in 6 days.  Denies any other symptomatic complaints or concerns today.  [place]  She was unable to come into the clinic today so we obtain the labs remotely.  Assessment and Plan: Lymphoma - on Brukinsa.  Some of patient's symptoms such as joint pains and bruising could be attributed to treatment.  Labs grossly unchanged from baseline.  Discussed with Dr. Donneta Romberg and patient offered to hold Brukinsa or dose reduce.  However, she reiterated that she is feeling the best that she has in a week and would like to continue current regimen for now.    UTI -patient completed course of nitrofurantoin  Nausea -patient request refill of her antiemetics  Follow Up Instructions: Patient will follow-up  with Dr. Donneta Romberg in 1 month or sooner if needed.   I discussed the assessment and treatment plan with the patient. The patient was provided an opportunity to ask questions and all were answered. The patient agreed with the plan and demonstrated an understanding of the instructions.   The patient was advised to call back or seek an in-person evaluation if the symptoms worsen or if the condition fails to improve as anticipated.  I provided 15 minutes of non-face-to-face time during this encounter.   Malachy Moan, NP

## 2022-11-15 ENCOUNTER — Other Ambulatory Visit: Payer: Self-pay

## 2022-11-15 ENCOUNTER — Other Ambulatory Visit: Payer: Self-pay | Admitting: Internal Medicine

## 2022-11-15 DIAGNOSIS — C8308 Small cell B-cell lymphoma, lymph nodes of multiple sites: Secondary | ICD-10-CM

## 2022-11-15 NOTE — Progress Notes (Signed)
Specialty Pharmacy Refill Coordination Note  Jacqueline Webb is a 82 y.o. female contacted today regarding refills of specialty medication(s) Zanubrutinib   Patient requested Delivery   Delivery date: 11/23/22   Verified address: [address] [place], [place] [address]   Medication will be filled on 11/22/22.  Refill request is pending. Please call if delayed.

## 2022-11-16 ENCOUNTER — Other Ambulatory Visit (HOSPITAL_COMMUNITY): Payer: Self-pay

## 2022-11-16 MED ORDER — BRUKINSA 80 MG PO CAPS
240.0000 mg | ORAL_CAPSULE | Freq: Every day | ORAL | 0 refills | Status: DC
  Filled 2022-11-16: qty 90, 30d supply, fill #0

## 2022-11-21 ENCOUNTER — Other Ambulatory Visit (HOSPITAL_COMMUNITY): Payer: Self-pay

## 2022-12-10 ENCOUNTER — Telehealth: Payer: Self-pay | Admitting: *Deleted

## 2022-12-10 ENCOUNTER — Ambulatory Visit: Payer: Medicare Other | Admitting: Internal Medicine

## 2022-12-10 ENCOUNTER — Inpatient Hospital Stay: Payer: Medicare Other

## 2022-12-10 NOTE — Telephone Encounter (Signed)
Patient called reporting that she had a biopsy Wednesday by Dermatology and had a bleeding problem that she had to go back on Friday to have taken care of at the biopsy site. Her dermatologist asked her to call Dr B to see oif she can hold her Brukinsa for a week so that she does not have any other complications. Please advise

## 2022-12-10 NOTE — Telephone Encounter (Signed)
Call returned to patient and I left message on her voice mail as requested this morning with doctor recommendations.

## 2022-12-12 ENCOUNTER — Other Ambulatory Visit: Payer: Self-pay

## 2022-12-14 ENCOUNTER — Other Ambulatory Visit: Payer: Self-pay | Admitting: *Deleted

## 2022-12-14 DIAGNOSIS — C8308 Small cell B-cell lymphoma, lymph nodes of multiple sites: Secondary | ICD-10-CM

## 2022-12-18 ENCOUNTER — Inpatient Hospital Stay (HOSPITAL_BASED_OUTPATIENT_CLINIC_OR_DEPARTMENT_OTHER): Payer: Medicare Other | Admitting: Internal Medicine

## 2022-12-18 ENCOUNTER — Other Ambulatory Visit: Payer: Self-pay

## 2022-12-18 ENCOUNTER — Encounter: Payer: Self-pay | Admitting: Internal Medicine

## 2022-12-18 ENCOUNTER — Inpatient Hospital Stay: Payer: Medicare Other | Attending: Internal Medicine

## 2022-12-18 VITALS — BP 137/70 | HR 76 | Temp 98.3°F | Wt 137.0 lb

## 2022-12-18 DIAGNOSIS — Z7901 Long term (current) use of anticoagulants: Secondary | ICD-10-CM | POA: Insufficient documentation

## 2022-12-18 DIAGNOSIS — C8308 Small cell B-cell lymphoma, lymph nodes of multiple sites: Secondary | ICD-10-CM

## 2022-12-18 DIAGNOSIS — I4891 Unspecified atrial fibrillation: Secondary | ICD-10-CM | POA: Diagnosis not present

## 2022-12-18 DIAGNOSIS — Z79899 Other long term (current) drug therapy: Secondary | ICD-10-CM | POA: Insufficient documentation

## 2022-12-18 DIAGNOSIS — Z8572 Personal history of non-Hodgkin lymphomas: Secondary | ICD-10-CM | POA: Diagnosis present

## 2022-12-18 DIAGNOSIS — D696 Thrombocytopenia, unspecified: Secondary | ICD-10-CM | POA: Diagnosis not present

## 2022-12-18 LAB — CBC WITH DIFFERENTIAL (CANCER CENTER ONLY)
Abs Immature Granulocytes: 0.03 10*3/uL (ref 0.00–0.07)
Basophils Absolute: 0.1 10*3/uL (ref 0.0–0.1)
Basophils Relative: 1 %
Eosinophils Absolute: 0.1 10*3/uL (ref 0.0–0.5)
Eosinophils Relative: 2 %
HCT: 35.4 % — ABNORMAL LOW (ref 36.0–46.0)
Hemoglobin: 11.3 g/dL — ABNORMAL LOW (ref 12.0–15.0)
Immature Granulocytes: 0 %
Lymphocytes Relative: 48 %
Lymphs Abs: 3.8 10*3/uL (ref 0.7–4.0)
MCH: 28.4 pg (ref 26.0–34.0)
MCHC: 31.9 g/dL (ref 30.0–36.0)
MCV: 88.9 fL (ref 80.0–100.0)
Monocytes Absolute: 0.7 10*3/uL (ref 0.1–1.0)
Monocytes Relative: 8 %
Neutro Abs: 3.3 10*3/uL (ref 1.7–7.7)
Neutrophils Relative %: 41 %
Platelet Count: 80 10*3/uL — ABNORMAL LOW (ref 150–400)
RBC: 3.98 MIL/uL (ref 3.87–5.11)
RDW: 15 % (ref 11.5–15.5)
WBC Count: 7.9 10*3/uL (ref 4.0–10.5)
nRBC: 0 % (ref 0.0–0.2)

## 2022-12-18 LAB — CMP (CANCER CENTER ONLY)
ALT: 22 U/L (ref 0–44)
AST: 31 U/L (ref 15–41)
Albumin: 4.2 g/dL (ref 3.5–5.0)
Alkaline Phosphatase: 76 U/L (ref 38–126)
Anion gap: 11 (ref 5–15)
BUN: 16 mg/dL (ref 8–23)
CO2: 28 mmol/L (ref 22–32)
Calcium: 9.6 mg/dL (ref 8.9–10.3)
Chloride: 99 mmol/L (ref 98–111)
Creatinine: 1.09 mg/dL — ABNORMAL HIGH (ref 0.44–1.00)
GFR, Estimated: 50 mL/min — ABNORMAL LOW (ref >60)
Glucose, Bld: 95 mg/dL (ref 70–99)
Potassium: 3.8 mmol/L (ref 3.5–5.1)
Sodium: 138 mmol/L (ref 135–145)
Total Bilirubin: 1 mg/dL (ref <1.2)
Total Protein: 6.8 g/dL (ref 6.5–8.1)

## 2022-12-18 LAB — LACTATE DEHYDROGENASE: LDH: 156 U/L (ref 98–192)

## 2022-12-18 NOTE — Assessment & Plan Note (Addendum)
#   Small B-cell lymphocytic lymphoma low-grade- stage IV-  AUG 2024- CT Neck- no findings suggestive of active lymphoma- stable.  . Numerous prominent bilateral cervical chain lymph nodes measuring up to 0.8 cm, increased in size compared to the most recent prior imaging of the neck which is a PET-CT from 2018. CT CAP- increasing lymphadenopathy in the chest, abdomen and pelvis, most evident in the chest, concerning  for recurrent disease.  # Continue Brukinsa [80 mg- 3 pills a day; since Mid sep 2024]-clinical response noted with improvement of the neck and axillary adenopathy.  However noted to have increasing bruising/bleeding.  Discussed that bleeding/bruising is multifactorial-Xarelto; Brukinsa; thrombocytopenia; and age.   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    # :A. Fib-stable continue Xarelto [Amanda Steineri]-I have reached out to cardiology regarding cutting the dose of Xarelto to 10 given the easy bruising/bleeding  # Bilateral lower extremity swelling-on Lasix-improved defer to Dr.Gilbert re: continued lasix.  Stable.   #Longstanding adnexal mass on the LEFT increasing in size slowly over time. 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  Discussed with Silva Bandy, will make a referral to gynecology oncology.  Appointment tomorrow.  #Incidental findings on Imaging  CT , 2024: New trace right pleural effusion;  Morphologic changes in the liver indicative of underlying cirrhosis. Cardiomegaly with biatrial dilatation.I reviewed/discussed/counseled the patient.   # DISPOSITION: # follow up  in 3 months- MD: labs- cbc/cmp/LDH- Dr.B # I reviewed the blood work- with the patient in detail; also reviewed the imaging independently [as summarized above]; and with the patient in detail.

## 2022-12-18 NOTE — Progress Notes (Signed)
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 Lymphoma low-grade, status Rituxan therapy. 2. Recurrent disease by clinical examination in December of 2012 3. Ovarian mass on PET scan in December of 2012 (normal CA 125). Ovarian mass has resolved after rituximab therapy. 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  Patient had total 5 cycles of chemotherapy with bendamustine and rituximab and 6 cycle was omitted because of significant side effects with and weakness and fatigue.  # OCT 2016- PET significant response; ON surveillance  # 18th OCT 2018- Gazyva;SEP 2018-/OCT 2018 CT scan- progression; s/p 6 cycles [#6 in April 2019]  # JAN 2020-CT scan shows progressive disease; start a acalabrutinib 100mg  BID;# feb 6th2020- decrease dose of acalbrutinib to 100 mg/day [sec to spon bruising] March 1st week- STOPPED Acala sec to spon brusing  # MARCH 17th 2020- Start Venetoclax [out pt ramp up]; 300 mg a day; Discontiued  in AUg 2024. CT CAP-N: AUG 2024-progressive lymphadenopathy.   # Brukinsa- MID  SEp 2024- 3 pills a day    # 11/06/2016- colonoscopy [Dr.Byrnett- 2 polyps]  #History of A. Fib-Xarelto; CKD stage II-III.   # #Right posterior neck melanoma stage I [ Dr.Lee/Elkins]..   -------------------------------------------------------------------    # left ? Adnexal mass- ? Etiology; mild SUV uptake incidental [stable compared to previous PET in 2016] Previous workup 2015- vaginal ultrasound negative. MRI February 2018- approximately 3 cm in size; slow increase in the size of the left adnexal mass over at least 3 years- suspect involvement of lymphoma rather than a primary gynecologic malignancy. --------------------------------------------------------------   DIAGNOSIS: SMALL LYMPHOCYTIC LEUKEMIA  STAGE:  IV    ;GOALS: control/pallaitive  CURRENT/MOST RECENT THERAPY: Venatoclax.    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   Jacqueline Webb 82 y.o.  female pleasant patient above history of SLL/CLL on Brukinsa and also xarelto for A-fib is here for follow-up/review results of the CT scan  Patient was taken off venetoclax in mid August given the progressive disease noted on CT scan.  Patient was started on Brukinsa.  Patient more recently had a skin biopsy/skin lesion excision which led to extensive bleeding.  Patient was also on Xarelto at that time.  No fever no chills.  Otherwise no lumps or bumps.  No abdominal pain nausea vomiting.  Chronic mild lower extremity swelling on Lasix as needed. No diarrhea no bleeding.  No fever no chills.  No nausea no vomiting.  Review of Systems  Constitutional:  Positive for malaise/fatigue and weight loss. Negative for chills, diaphoresis and fever.  HENT:  Negative for nosebleeds  and sore throat.   Eyes:  Negative for double vision.  Respiratory:  Negative for cough, hemoptysis, sputum production, shortness of breath and wheezing.   Cardiovascular:  Positive for leg swelling. Negative for chest pain, palpitations and orthopnea.  Gastrointestinal:  Negative for abdominal pain, blood in stool, constipation, diarrhea, heartburn, melena and vomiting.  Genitourinary:  Negative for dysuria, frequency and urgency.  Musculoskeletal:  Positive for back pain and joint pain.  Neurological:  Negative for dizziness, tingling, focal weakness, weakness and headaches.  Psychiatric/Behavioral:  Negative for depression. The patient is not nervous/anxious and does not have insomnia.     PAST MEDICAL HISTORY :  Past Medical History:  Diagnosis Date   A-fib [facility])    Benign neoplasm of rectum and anal canal    Chronic leukemia of unspecified cell type, without mention of having achieved remission    Dysrhythmia    A-Fib   Hypercholesteremia    Hypertension    Hyperthyroidism    Lymphoma (HCC) 2007   Non Hodgkin's lymphoma (HCC) 01/22/2006   Personal history of chemotherapy     PAST SURGICAL HISTORY :   Past Surgical History:  Procedure Laterality Date   ABDOMINAL HYSTERECTOMY  1958   APPENDECTOMY     BREAST BIOPSY Right 1970   right breast biopsy with clip- neg   BREAST BIOPSY Left 2002   left breast biopsy with clip-neg   BREAST BIOPSY Right 2014   right breast biopsy with clip-neg   BREAST CYST ASPIRATION Bilateral 2000   bilateral fine needle aspiration   BREAST SURGERY Left 1990   biopsy   BREAST SURGERY Right May 08, 2012   complex fibroadenoma without malignancy.   CARDIAC CATHETERIZATION  2014   COLONOSCOPY  2009, 2012    hyperplastic rectal polyp 2012 tubular adenoma of proximal ascending colon 2000 9 repeat exam due to 2017.   COLONOSCOPY WITH PROPOFOL N/A 11/06/2016   Procedure: COLONOSCOPY WITH PROPOFOL;  Surgeon: [name], MD;  Location: [facility]  ENDOSCOPY;  Service: Endoscopy;  Laterality: N/A;   COLONOSCOPY WITH PROPOFOL N/A 02/27/2019   Procedure: COLONOSCOPY WITH PROPOFOL;  Surgeon: [name], MD;  Location: [facility] ENDOSCOPY;  Service: General;  Laterality: N/A;   CORONARY ARTERY BYPASS GRAFT  1999   FLEXIBLE SIGMOIDOSCOPY  1998    FAMILY HISTORY :   Family History  Problem Relation Age of Onset   Heart disease Mother        Died age 70   Asthma Mother    Lung cancer Father        Died age 44   Heart attack Sister    Tuberculosis Sister        54 years old   Lung cancer Sister        Died in her 25s   Angina Sister    Breast cancer Sister 23   Rheum arthritis Sister    Aneurysm Sister        Brain, died age 38   Angina Sister    CAD Brother        double bypass   Heart disease Brother        Stent placement   Heart disease Brother        stent placement   Cancer Niece        breast cancer niece, pancreatic cancer  niece   Breast cancer Niece    Ovarian cancer Neg Hx    Diabetes Neg Hx     SOCIAL HISTORY:   Social History   Tobacco Use   Smoking status: Never   Smokeless tobacco: Never  Vaping Use   Vaping status: Never Used  Substance Use Topics   Alcohol use: Yes    Alcohol/week: 0.0 - 7.0 standard drinks of alcohol    Comment: 1 drink a day of wine or cocktail   Drug use: No    ALLERGIES:  is allergic to pravastatin.  MEDICATIONS:  Current Outpatient Medications  Medication Sig Dispense Refill   Docusate Calcium (STOOL SOFTENER PO) Take by mouth as needed. (Patient not taking: Reported on 09/06/2022)     ezetimibe (ZETIA) 10 MG tablet Take 1 tablet (10 mg total) daily by mouth. 30 tablet 12   furosemide (LASIX) 20 MG tablet Take 1 tablet (20 mg total) by mouth every morning. 30 tablet 5   isosorbide mononitrate (IMDUR) 30 MG 24 hr tablet Take 30 mg by mouth daily.     levothyroxine (SYNTHROID) 50 MCG tablet Take 50 mcg by mouth daily before breakfast.     lisinopril-hydrochlorothiazide  (ZESTORETIC) 20-12.5 MG tablet TAKE 1 TABLET BY MOUTH DAILY 30 tablet 0   melatonin 5 MG TABS Take 5 mg by mouth. (Patient not taking: Reported on 09/06/2022)     metoprolol succinate (TOPROL-XL) 25 MG 24 hr tablet Take 25 mg by mouth daily.      ondansetron (ZOFRAN) 4 MG tablet Take 1 tablet (4 mg total) by mouth every 8 (eight) hours as needed for nausea or vomiting. 40 tablet 3   oxybutynin (DITROPAN) 5 MG tablet Take 1 tablet (5 mg total) by mouth 2 (two) times daily. 180 tablet 0   rosuvastatin (CRESTOR) 5 MG tablet Take 5 mg by mouth daily. Takes 3 times a week     XARELTO 20 MG TABS tablet Take 20 mg by mouth daily.     zanubrutinib (BRUKINSA) 80 MG capsule Take 3 capsules (240 mg total) by mouth daily. (Patient not taking: Reported on 12/18/2022) 90 capsule 0   No current facility-administered medications for this visit.   Facility-Administered Medications Ordered in Other Visits  Medication Dose Route Frequency Provider Last Rate Last Admin   heparin lock flush 100 unit/mL  500 Units Intravenous Once Louretta Shorten R, MD       sodium chloride flush (NS) 0.9 % injection 10 mL  10 mL Intravenous PRN Earna Coder, MD        PHYSICAL EXAMINATION: ECOG PERFORMANCE STATUS: 1 - Symptomatic but completely ambulatory  BP 137/70 (BP Location: Right Arm, Patient Position: Sitting, Cuff Size: Normal)   Pulse 76   Temp 98.3 F (36.8 C) (Tympanic)   Wt 137 lb (62.1 kg)   SpO2 100%   BMI 22.80 kg/m   Filed Weights   12/18/22 1330  Weight: 137 lb (62.1 kg)   Improvement/resolution of the neck and axillary adenopathy.  Physical Exam HENT:     Head: Normocephalic and atraumatic.     Mouth/Throat:     Pharynx: No oropharyngeal exudate.  Eyes:     Pupils: Pupils are equal, round, and reactive to light.  Cardiovascular:     Rate and Rhythm: Normal rate and regular rhythm.  Pulmonary:     Effort: No respiratory distress.     Breath sounds: No wheezing.  Abdominal:      General: Bowel sounds are  normal. There is no distension.     Palpations: Abdomen is soft. There is no mass.     Tenderness: There is no abdominal tenderness. There is no guarding or rebound.  Musculoskeletal:        General: No tenderness. Normal range of motion.     Cervical back: Normal range of motion and neck supple.  Skin:    General: Skin is warm.  Neurological:     Mental Status: She is alert and oriented to person, place, and time.  Psychiatric:        Mood and Affect: Affect normal.     LABORATORY DATA:  I have reviewed the data as listed    Component Value Date/Time   NA 138 12/18/2022 1310   NA 139 07/03/2017 1425   NA 141 05/11/2014 0853   K 3.8 12/18/2022 1310   K 3.6 05/11/2014 0853   CL 99 12/18/2022 1310   CL 103 05/11/2014 0853   CO2 28 12/18/2022 1310   CO2 29 05/11/2014 0853   GLUCOSE 95 12/18/2022 1310   GLUCOSE 107 (H) 05/11/2014 0853   BUN 16 12/18/2022 1310   BUN 19 07/03/2017 1425   BUN 22 (H) 05/11/2014 0853   CREATININE 1.09 (H) 12/18/2022 1310   CREATININE 1.07 (H) 05/11/2014 0853   CALCIUM 9.6 12/18/2022 1310   CALCIUM 10.1 05/11/2014 0853   PROT 6.8 12/18/2022 1310   PROT 6.4 07/03/2017 1425   PROT 7.3 05/11/2014 0853   ALBUMIN 4.2 12/18/2022 1310   ALBUMIN 4.3 07/03/2017 1425   ALBUMIN 4.7 05/11/2014 0853   AST 31 12/18/2022 1310   ALT 22 12/18/2022 1310   ALT 20 05/11/2014 0853   ALKPHOS 76 12/18/2022 1310   ALKPHOS 47 05/11/2014 0853   BILITOT 1.0 12/18/2022 1310   GFRNONAA 50 (L) 12/18/2022 1310   GFRNONAA 51 (L) 05/11/2014 0853   GFRAA >60 08/05/2019 0941   GFRAA 59 (L) 05/11/2014 0853    No results found for: "SPEP", "UPEP"  Lab Results  Component Value Date   WBC 7.9 12/18/2022   NEUTROABS 3.3 12/18/2022   HGB 11.3 (L) 12/18/2022   HCT 35.4 (L) 12/18/2022   MCV 88.9 12/18/2022   PLT 80 (L) 12/18/2022      Chemistry      Component Value Date/Time   NA 138 12/18/2022 1310   NA 139 07/03/2017 1425   NA 141  05/11/2014 0853   K 3.8 12/18/2022 1310   K 3.6 05/11/2014 0853   CL 99 12/18/2022 1310   CL 103 05/11/2014 0853   CO2 28 12/18/2022 1310   CO2 29 05/11/2014 0853   BUN 16 12/18/2022 1310   BUN 19 07/03/2017 1425   BUN 22 (H) 05/11/2014 0853   CREATININE 1.09 (H) 12/18/2022 1310   CREATININE 1.07 (H) 05/11/2014 0853   GLU 98 05/25/2013 0000      Component Value Date/Time   CALCIUM 9.6 12/18/2022 1310   CALCIUM 10.1 05/11/2014 0853   ALKPHOS 76 12/18/2022 1310   ALKPHOS 47 05/11/2014 0853   AST 31 12/18/2022 1310   ALT 22 12/18/2022 1310   ALT 20 05/11/2014 0853   BILITOT 1.0 12/18/2022 1310       RADIOGRAPHIC STUDIES: I have personally reviewed the radiological images as listed and agreed with the findings in the report. No results found.   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  . Numerous prominent bilateral cervical chain lymph nodes measuring up to 0.8 cm, increased in size compared to the most recent prior imaging of the neck which is a PET-CT from 2018. CT CAP- increasing lymphadenopathy in the chest, abdomen and pelvis, most evident in the chest, concerning  for recurrent disease.  # Continue Brukinsa [80 mg- 3 pills a day; since Mid sep 2024]-clinical response noted with improvement of the neck and axillary adenopathy.  However noted to have increasing bruising/bleeding.  Discussed that bleeding/bruising is multifactorial-Xarelto; Brukinsa; thrombocytopenia; and age.   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    # :A. Fib-stable continue Xarelto [Amanda Steineri]-I have reached out to cardiology regarding cutting the dose of Xarelto to 10 given the easy bruising/bleeding  # Bilateral lower extremity swelling-on  Lasix-improved defer to Dr.Gilbert re: continued lasix.  Stable.   #Longstanding adnexal mass on the LEFT increasing in size slowly over time. 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  Discussed with Jacqueline Webb, will make a referral to gynecology oncology.  Appointment tomorrow.  #Incidental findings on Imaging  CT , 2024: New trace right pleural effusion;  Morphologic changes in the liver indicative of underlying cirrhosis. Cardiomegaly with biatrial dilatation.I reviewed/discussed/counseled the patient.   # DISPOSITION: # follow up  in 3 months- MD: labs- cbc/cmp/LDH- Dr.B # I reviewed the blood work- with the patient in detail; also reviewed the imaging independently [as summarized above]; and with the patient in detail.       Orders Placed This Encounter  Procedures   CBC with Differential (Cancer Center Only)    Standing Status:   Future    Standing Expiration Date:   12/18/2023   CMP (Cancer Center only)    Standing Status:   Future    Standing Expiration Date:   12/18/2023   Lactate dehydrogenase    Standing Status:   Future    Standing Expiration Date:   12/18/2023   Ambulatory referral to Gynecologic Oncology    Referral Priority:   Routine    Referral Type:   Consultation    Referral Reason:   Specialty Services Required    Number of Visits Requested:   1    All questions were answered. The patient knows to call the clinic with any problems, questions or concerns.      Earna Coder, MD 12/18/2022 2:15 PM

## 2022-12-18 NOTE — Progress Notes (Signed)
Patient wants to know if she needs to go back on her Brukinsa medication. She feels like that type of medication causes so much bruising and bleeding for longer periods of time.

## 2022-12-19 ENCOUNTER — Inpatient Hospital Stay: Payer: Medicare Other | Admitting: Obstetrics and Gynecology

## 2022-12-19 VITALS — BP 146/78 | HR 63 | Temp 98.6°F | Resp 20 | Wt 138.1 lb

## 2022-12-19 DIAGNOSIS — N9489 Other specified conditions associated with female genital organs and menstrual cycle: Secondary | ICD-10-CM

## 2022-12-19 NOTE — Progress Notes (Unsigned)
Gynecologic Oncology Consult Visit   Referring Provider: Dr. Donneta Romberg  Chief Complaint: Enlarging Left Adnexal Mass  Subjective:  Jacqueline Webb is a 82 y.o. female who is seen in consultation from Dr. Donneta Romberg for longstanding adnexal mass on left that was found to be slowly increasing in size.   She is followed by Dr. Donneta Romberg for small b cell lymphocytic lymphoma, low grade, stage IV, on Brukinsa with increasing disease burden on September 2024 imaging. Treatment has been complicated by thromboctytopenia and anticoagulation, on xarelto for a fib.   09/21/22- CT C/A/P w/ contrast - Reproductive: Status post hysterectomy. Right ovary is not confidently identified may be surgically absent or atrophic. Chronic solid-appearing left adnexal mass again noted (axial image 100 of series 2 and coronal image 50 of series 5) measuring 5.0 x 4.1 x 4.3 cm on today's study, mildly increased in size compared to prior examinations.  10/18/21- CT A/P w contrast - LEFT pelvic low-density mass lesion measuring 3.6 x 4.5 cm is within 1 mm of measurements dating back to April of 2022. This mass resides in the LEFT adnexa  05/02/20- CT C/A/P w contrast - Reproductive: Stable left adnexal mass at approximately 4.6 by 3.6 cm on image 100 series 2, potentially a fibrothecoma or similar benign lesion as reported on multiple prior exams. Uterus absent.  08/04/19- CT A/P  - Reproductive: Left adnexal mass appears somewhat homogeneous measuring 4.5 by 3.6 by 3.7 cm (volume = 31 cm^3)with an internal density of 61 Hounsfield units  09/01/18- CT A/P  - Left adnexal mass, 4.6 by 3.2 cm, formerly 4.8 by 3.4 cm by my measurements. Uterus absent.  08/20/16- CT A/P - Reproductive: Prior hysterectomy noted. Left adnexal mass currently measures 3.4 x 3.5 cm on image 55/ 2. This is not significantly changed compared to MRI on 03/16/2016 but is increased in size compared to previous PET-CT in 2015 when it measured 3.0 x  3.0 cm  03/16/16- MR Pelvis 3.8 x 2.7 cm solid left ovarian mass (series 3/image 20) with enhancement following contrast administration, mildly FDG avid on prior PET  02/22/16- PET Soft tissue fullness anterior to the left psoas muscle, possibly an adnexal lesion, measuring 4.2 by 3.0 cm, maximum SUV 4.2. This has been present but smaller on prior exams and also of similar metabolic activity. He was worked up in 2015 with a pelvic ultrasound which failed does show the lesion.   Problem List: Patient Active Problem List   Diagnosis Date Noted   Dermatochalasis of eyelid 01/20/2020   Ptosis of eyelid, right 01/20/2020   Visual field defect 01/20/2020   Venous insufficiency of both lower extremities 07/15/2017   Abdominal distension 01/03/2017   Goals of care, counseling/discussion 11/08/2016   Abnormal CT scan, pelvis 11/05/2016   Intussusception of small bowel (HCC) 08/22/2016   Mass of left ovary 03/06/2016   Cystocele, midline 12/27/2015   Cystocele 10/19/2015   Status post hysterectomy 10/19/2015   Vaginal atrophy 10/19/2015   Small cell B-cell lymphoma of lymph nodes of multiple sites (HCC) 10/03/2015   Moderate mitral insufficiency 08/10/2015   Persistent atrial fibrillation (HCC) 08/10/2015   Urge incontinence of urine 06/07/2015   Personal history of surgery to heart and great vessels, presenting hazards to health 05/14/2014   Raynaud's syndrome 05/14/2014   Adult hypothyroidism 05/14/2014   Hypercholesteremia 05/14/2014   Coronary artery abnormality 05/14/2014   Essential (primary) hypertension 05/14/2014   Personal history of malignant neoplasm of breast 05/14/2014   CAD (coronary artery  disease) of bypass graft 11/24/2013    Past Medical History: Past Medical History:  Diagnosis Date   A-fib (HCC)    Benign neoplasm of rectum and anal canal    Chronic leukemia of unspecified cell type, without mention of having achieved remission    Dysrhythmia    A-Fib    Hypercholesteremia    Hypertension    Hyperthyroidism    Lymphoma (HCC) 2007   Non Hodgkin's lymphoma (HCC) 01/22/2006   Personal history of chemotherapy     Past Surgical History: Past Surgical History:  Procedure Laterality Date   ABDOMINAL HYSTERECTOMY  1958   APPENDECTOMY     BREAST BIOPSY Right 1970   right breast biopsy with clip- neg   BREAST BIOPSY Left 2002   left breast biopsy with clip-neg   BREAST BIOPSY Right 2014   right breast biopsy with clip-neg   BREAST CYST ASPIRATION Bilateral 2000   bilateral fine needle aspiration   BREAST SURGERY Left 1990   biopsy   BREAST SURGERY Right May 08, 2012   complex fibroadenoma without malignancy.   CARDIAC CATHETERIZATION  2014   COLONOSCOPY  2009, 2012    hyperplastic rectal polyp 2012 tubular adenoma of proximal ascending colon 2000 9 repeat exam due to 2017.   COLONOSCOPY WITH PROPOFOL N/A 11/06/2016   Procedure: COLONOSCOPY WITH PROPOFOL;  Surgeon: [name], MD;  Location: [facility];  Service: Endoscopy;  Laterality: N/A;   COLONOSCOPY WITH PROPOFOL N/A 02/27/2019   Procedure: COLONOSCOPY WITH PROPOFOL;  Surgeon: [name], MD;  Location: [facility] END[facility]COPY;  Service: General;  Laterality: N/A;   CORONARY ARTERY BYPASS GRAFT  1999   FLEXIBLE SIGMOIDOSCOPY  1998    Past Gynecologic History:  Menarche: {NUMBERS 0-12:18577} Menstrual details: Lasts {NUMBERS 0-12:18577} days, {DESC; menstrual flow:21146} Menses regular: {yes no:20984} Last Menstrual Period: *** History of OCP/HRT use: *** History of Abnormal pap: {yes no:20984}, {PAP RESULT:21077} Last pap: {Blank multiple:19196} History of STDs: {STD history:20597} Contraception: {contraceptive methods:21883} Sexually active: {yes no unk:32069}  OB History:  OB History  Gravida Para Term Preterm AB Living  2 2 1 1   2   SAB IAB Ectopic Multiple Live Births          2    # Outcome Date GA Lbr Len/2nd Weight Sex Type Anes PTL Lv  2 Preterm  1965   5 lb 9.6 oz (2.54 kg) F Vag-Spont   LIV  1 Term 1963   5 lb 1.8 oz (2.318 kg) M Vag-Spont   LIV    Family History: Family History  Problem Relation Age of Onset   Heart disease Mother        Died age 40   Asthma Mother    Lung cancer Father        Died age 67   Heart attack Sister    Tuberculosis Sister        52 years old   Lung cancer Sister        Died in her 48s   Angina Sister    Breast cancer Sister 22   Rheum arthritis Sister    Aneurysm Sister        Brain, died age 37   Angina Sister    CAD Brother        double bypass   Heart disease Brother        Stent placement   Heart disease Brother        stent placement   Cancer  Niece        breast cancer niece, pancreatic cancer niece   Breast cancer Niece    Ovarian cancer Neg Hx    Diabetes Neg Hx     Social History: Social History   Socioeconomic History   Marital status: Married    Spouse name: Not on file   Number of children: 2   Years of education: In [place]   Highest education level: 10th grade  Occupational History   Occupation: retired  Tobacco Use   Smoking status: Never   Smokeless tobacco: Never  Vaping Use   Vaping status: Never Used  Substance and Sexual Activity   Alcohol use: Yes    Alcohol/week: 0.0 - 7.0 standard drinks of alcohol    Comment: 1 drink a day of wine or cocktail   Drug use: No   Sexual activity: Never  Other Topics Concern   Not on file  Social History Narrative   Not on file   Social Determinants of Health   Financial Resource Strain: Low Risk  (03/27/2022)   Overall Financial Resource Strain (CARDIA)    Difficulty of Paying Living Expenses: Not hard at all  Food Insecurity: No Food Insecurity (03/27/2022)   Hunger Vital Sign    Worried About Running Out of Food in the Last Year: Never true    Ran Out of Food in the Last Year: Never true  Transportation Needs: No Transportation Needs (03/27/2022)   PRAPARE - Administrator, Civil Service  (Medical): No    Lack of Transportation (Non-Medical): No  Physical Activity: Inactive (03/27/2022)   Exercise Vital Sign    Days of Exercise per Week: 0 days    Minutes of Exercise per Session: 0 min  Stress: Stress Concern Present (03/27/2022)   [employer] of Occupational Health - Occupational Stress Questionnaire    Feeling of Stress : Rather much  Social Connections: Moderately Integrated (03/27/2022)   Social Connection and Isolation Panel [NHANES]    Frequency of Communication with Friends and Family: More than three times a week    Frequency of Social Gatherings with Friends and Family: More than three times a week    Attends Religious Services: More than 4 times per year    Active Member of [employer] or Organizations: Yes    Attends Banker Meetings: More than 4 times per year    Marital Status: Widowed  Intimate Partner Violence: Not At Risk (03/27/2022)   Humiliation, Afraid, Rape, and Kick questionnaire    Fear of Current or Ex-Partner: No    Emotionally Abused: No    Physically Abused: No    Sexually Abused: No    Allergies: Allergies  Allergen Reactions   Pravastatin Other (See Comments)    Current Medications: Current Outpatient Medications  Medication Sig Dispense Refill   ezetimibe (ZETIA) 10 MG tablet Take 1 tablet (10 mg total) daily by mouth. 30 tablet 12   furosemide (LASIX) 20 MG tablet Take 1 tablet (20 mg total) by mouth every morning. 30 tablet 5   isosorbide mononitrate (IMDUR) 30 MG 24 hr tablet Take 30 mg by mouth daily.     levothyroxine (SYNTHROID) 50 MCG tablet Take 50 mcg by mouth daily before breakfast.     lisinopril-hydrochlorothiazide (ZESTORETIC) 20-12.5 MG tablet TAKE 1 TABLET BY MOUTH DAILY 30 tablet 0   metoprolol succinate (TOPROL-XL) 25 MG 24 hr tablet Take 25 mg by mouth daily.      ondansetron (ZOFRAN) 4 MG  tablet Take 1 tablet (4 mg total) by mouth every 8 (eight) hours as needed for nausea or vomiting. 40 tablet 3    oxybutynin (DITROPAN) 5 MG tablet Take 1 tablet (5 mg total) by mouth 2 (two) times daily. 180 tablet 0   rosuvastatin (CRESTOR) 5 MG tablet Take 5 mg by mouth daily. Takes 3 times a week     XARELTO 20 MG TABS tablet Take 20 mg by mouth daily.     zanubrutinib (BRUKINSA) 80 MG capsule Take 3 capsules (240 mg total) by mouth daily. 90 capsule 0   Docusate Calcium (STOOL SOFTENER PO) Take by mouth as needed. (Patient not taking: Reported on 09/06/2022)     melatonin 5 MG TABS Take 5 mg by mouth. (Patient not taking: Reported on 09/06/2022)     No current facility-administered medications for this visit.   Facility-Administered Medications Ordered in Other Visits  Medication Dose Route Frequency Provider Last Rate Last Admin   heparin lock flush 100 unit/mL  500 Units Intravenous Once Louretta Shorten R, MD       sodium chloride flush (NS) 0.9 % injection 10 mL  10 mL Intravenous PRN Earna Coder, MD        Review of Systems General: negative for fevers, changes in weight or night sweats Skin: negative for changes in moles or sores or rash Eyes: negative for changes in vision HEENT: negative for change in hearing, tinnitus, voice changes Pulmonary: negative for dyspnea, orthopnea, productive cough, wheezing Cardiac: negative for palpitations, pain Gastrointestinal: negative for nausea, vomiting, constipation, diarrhea, hematemesis, hematochezia Genitourinary/Sexual: negative for dysuria, retention, hematuria, incontinence Ob/Gyn:  negative for abnormal bleeding, or pain Musculoskeletal: negative for pain, joint pain, back pain Hematology: negative for easy bruising, abnormal bleeding Neurologic/Psych: negative for headaches, seizures, paralysis, weakness, numbness   Objective:  Physical Examination:  BP (!) 146/78   Pulse 63   Temp 98.6 F (37 C)   Resp 20   Wt 138 lb 1.6 oz (62.6 kg)   SpO2 100%   BMI 22.98 kg/m     ECOG Performance Status: {DESC; ECOG PERFORMANCE  STATUS (NQF 385):19948:p}  General appearance: {Exam; general:16600::"alert","cooperative","appears stated age"} HEENT: {HEENT:32174::"mucus membranes moist","no mucosal lesions","posterior pharynx benign","PERRL","EOMI","sclera clear"} Neck: {neck:32177::"supple","no thyroid enlargement or cervical adenopathy"} Lymph node survey: {pe lymph nodes:310023:: "axillary", "inguinal", "supraclavicular", "non-palpable"} Cardiovascular: {cardiovascular:32178::"regular rate and rhythm","without murmurs, rubs or gallops"} Respiratory: {respiratory:32179::"clear to auscultation"} Breast exam: {pe breast exam:315056::"breasts appear normal, no suspicious masses, no skin or nipple changes or axillary nodes"}. Abdomen: {GI:32181::"no palpable masses","no hernias","well healed incision","soft","nontender, nondistended","bowel sounds present","without hepatosplenomegaly"} Back: {back exam:311380::"inspection of back is normal"} Extremities: {PE; Extremities Gen Med:33669::"no lower extremity edema"} Skin exam - {skin exam:315960::"normal coloration and turgor, no rashes, no suspicious skin lesions noted"}. Neurological exam reveals {neuro:315902::"alert, oriented, normal speech, no focal findings or movement disorder noted"}.  Pelvic: Exam chaperoned by Nursing EGBUS: no lesions Cervix: no lesions, nontender, mobile Vagina: no lesions, no discharge or bleeding Uterus: normal size, nontender, mobile Adnexa: no palpable masses Rectovaginal: confirmatory  Lab Review Labs on site today: ***  Radiologic Imaging: ***    Assessment:  Jacqueline Webb is a 82 y.o. female diagnosed with {insert grade of cancer if applicable} {GYN ONC Diagnosis:32727}.   Medical co-morbidities complicating care: {comorbidities:33638}.  Plan:   Problem List Items Addressed This Visit   None   We discussed options for management including ***. Based on *** , we recommend {GYN ONC Reccommendation:32802}.  {Insert risk  statement for surgery if  preop, .gynoncsurrisk} Suggested return to clinic in  {1-10:18281} {units:10146}.    The patient's diagnosis, an outline of the further diagnostic and laboratory studies which will be required, the recommendation for surgery, and alternatives were discussed with her and her accompanying family members.  All questions were answered to their satisfaction.  A total of *** minutes were spent with the patient/family today; ***% was spent in education, counseling and coordination of care for {GYN ONC Diagnosis:32727}.    Alinda Dooms, NP    CC:  Bosie Clos, MD [address] [place],  [place] [address] [phone]

## 2022-12-21 ENCOUNTER — Other Ambulatory Visit: Payer: Self-pay

## 2022-12-27 ENCOUNTER — Other Ambulatory Visit: Payer: Self-pay

## 2022-12-28 ENCOUNTER — Other Ambulatory Visit: Payer: Self-pay

## 2022-12-29 ENCOUNTER — Other Ambulatory Visit: Payer: Self-pay | Admitting: Family Medicine

## 2022-12-29 DIAGNOSIS — N3941 Urge incontinence: Secondary | ICD-10-CM

## 2022-12-31 NOTE — Telephone Encounter (Signed)
Last OV--10/11/21. Please advise

## 2023-01-22 ENCOUNTER — Other Ambulatory Visit (HOSPITAL_COMMUNITY): Payer: Self-pay

## 2023-01-22 ENCOUNTER — Other Ambulatory Visit: Payer: Self-pay | Admitting: Internal Medicine

## 2023-01-22 ENCOUNTER — Other Ambulatory Visit: Payer: Self-pay

## 2023-01-22 DIAGNOSIS — C8308 Small cell B-cell lymphoma, lymph nodes of multiple sites: Secondary | ICD-10-CM

## 2023-01-22 MED ORDER — BRUKINSA 80 MG PO CAPS
240.0000 mg | ORAL_CAPSULE | Freq: Every day | ORAL | 0 refills | Status: DC
Start: 2023-01-22 — End: 2023-02-19
  Filled 2023-01-22: qty 90, 30d supply, fill #0

## 2023-01-22 NOTE — Progress Notes (Signed)
 Specialty Pharmacy Refill Coordination Note  Jacqueline Webb is a 82 y.o. female contacted today regarding refills of specialty medication(s) Zanubrutinib  (Brukinsa )  Patient requested Delivery   Delivery date: 01/25/23   Verified address: [address], [place], [place] [address]  Medication will be filled on 01/24/23. **Pending Refill Request**   Morene Potters, CPhT Oncology Pharmacy Patient Advocate  [facility]  [phone] (phone) [phone] (fax) 01/22/2023 2:13 PM

## 2023-01-24 ENCOUNTER — Other Ambulatory Visit: Payer: Self-pay

## 2023-02-12 ENCOUNTER — Other Ambulatory Visit (HOSPITAL_COMMUNITY): Payer: Self-pay

## 2023-02-12 ENCOUNTER — Other Ambulatory Visit (HOSPITAL_COMMUNITY): Payer: Self-pay | Admitting: Pharmacy Technician

## 2023-02-12 ENCOUNTER — Other Ambulatory Visit: Payer: Self-pay | Admitting: Internal Medicine

## 2023-02-12 DIAGNOSIS — C8308 Small cell B-cell lymphoma, lymph nodes of multiple sites: Secondary | ICD-10-CM

## 2023-02-12 NOTE — Progress Notes (Signed)
Specialty Pharmacy Refill Coordination Note  Jacqueline Webb is a 83 y.o. female contacted today regarding refills of specialty medication(s) Zanubrutinib (Brukinsa)   Patient requested Delivery   Delivery date: 02/21/23   Verified address: Patient address [address]  [place]   Medication will be filled on 02/20/23.  Refill Request sent to MD; call if any delays

## 2023-02-19 ENCOUNTER — Other Ambulatory Visit: Payer: Self-pay | Admitting: Internal Medicine

## 2023-02-19 ENCOUNTER — Other Ambulatory Visit: Payer: Self-pay

## 2023-02-19 DIAGNOSIS — C8308 Small cell B-cell lymphoma, lymph nodes of multiple sites: Secondary | ICD-10-CM

## 2023-02-20 ENCOUNTER — Other Ambulatory Visit (HOSPITAL_COMMUNITY): Payer: Self-pay

## 2023-02-20 ENCOUNTER — Other Ambulatory Visit: Payer: Self-pay

## 2023-02-22 ENCOUNTER — Other Ambulatory Visit: Payer: Self-pay

## 2023-02-22 MED ORDER — BRUKINSA 80 MG PO CAPS
240.0000 mg | ORAL_CAPSULE | Freq: Every day | ORAL | 0 refills | Status: DC
Start: 2023-02-22 — End: 2023-04-11
  Filled 2023-02-22: qty 90, 30d supply, fill #0

## 2023-02-22 NOTE — Progress Notes (Signed)
Received refill 1/31. Shipping 2/3. Called patient to let her know.

## 2023-03-12 ENCOUNTER — Other Ambulatory Visit: Payer: Self-pay

## 2023-03-15 ENCOUNTER — Other Ambulatory Visit: Payer: Self-pay

## 2023-03-20 ENCOUNTER — Other Ambulatory Visit: Payer: Medicare Other

## 2023-03-20 ENCOUNTER — Ambulatory Visit: Payer: Medicare Other | Admitting: Internal Medicine

## 2023-04-03 ENCOUNTER — Other Ambulatory Visit (HOSPITAL_COMMUNITY): Payer: Self-pay

## 2023-04-05 ENCOUNTER — Other Ambulatory Visit (HOSPITAL_COMMUNITY): Payer: Self-pay

## 2023-04-11 ENCOUNTER — Other Ambulatory Visit (HOSPITAL_COMMUNITY): Payer: Self-pay

## 2023-04-11 ENCOUNTER — Other Ambulatory Visit: Payer: Self-pay | Admitting: Internal Medicine

## 2023-04-11 DIAGNOSIS — C8308 Small cell B-cell lymphoma, lymph nodes of multiple sites: Secondary | ICD-10-CM

## 2023-04-11 NOTE — Progress Notes (Signed)
 Specialty Pharmacy Ongoing Clinical Assessment Note  Jacqueline Webb is a 83 y.o. female who is being followed by the specialty pharmacy service for RxSp Oncology   Patient's specialty medication(s) reviewed today: Zanubrutinib (Brukinsa)   Missed doses in the last 4 weeks: 0   Patient/Caregiver did not have any additional questions or concerns.   Therapeutic benefit summary: Patient is achieving benefit   Adverse events/side effects summary: Experienced adverse events/side effects (increase in bruising and bleeding, patient was counseled on signs and symptoms of bleeding that would required immediate medical attention, she was aware and is continuing to monitor)   Patient's therapy is appropriate to: Continue    Goals Addressed             This Visit's Progress    Slow Disease Progression   On track    Patient is on track . Patient will maintain adherence. Patient was reported to have stable disease at her last office visit on 12/18/22.  She has another upcoming appointment next month.          Follow up:  6 months  Servando Snare Specialty Pharmacist

## 2023-04-11 NOTE — Progress Notes (Signed)
 Specialty Pharmacy Refill Coordination Note  Jacqueline Webb is a 83 y.o. female contacted today regarding refills of specialty medication(s) Zanubrutinib [place])   Patient requested Delivery   Delivery date: 04/17/23   Verified address: [address]  [place], [place]   Medication will be filled on 04/16/23. This fill date is pending response to refill request from provider. Patient is aware and if they have not received fill by intended date they must follow up with pharmacy.

## 2023-04-15 ENCOUNTER — Other Ambulatory Visit: Payer: Self-pay

## 2023-04-15 MED ORDER — BRUKINSA 80 MG PO CAPS
240.0000 mg | ORAL_CAPSULE | Freq: Every day | ORAL | 0 refills | Status: DC
  Filled 2023-04-15: qty 90, 30d supply, fill #0

## 2023-04-16 ENCOUNTER — Other Ambulatory Visit: Payer: Self-pay

## 2023-05-03 ENCOUNTER — Other Ambulatory Visit: Payer: Self-pay

## 2023-05-06 ENCOUNTER — Other Ambulatory Visit: Payer: Self-pay

## 2023-05-08 ENCOUNTER — Inpatient Hospital Stay: Attending: Internal Medicine

## 2023-05-08 ENCOUNTER — Other Ambulatory Visit: Payer: Self-pay

## 2023-05-08 ENCOUNTER — Inpatient Hospital Stay (HOSPITAL_BASED_OUTPATIENT_CLINIC_OR_DEPARTMENT_OTHER): Admitting: Internal Medicine

## 2023-05-08 ENCOUNTER — Encounter: Payer: Self-pay | Admitting: Internal Medicine

## 2023-05-08 VITALS — BP 125/69 | HR 81 | Temp 97.6°F | Resp 16 | Wt 148.0 lb

## 2023-05-08 DIAGNOSIS — R601 Generalized edema: Secondary | ICD-10-CM | POA: Diagnosis not present

## 2023-05-08 DIAGNOSIS — C8308 Small cell B-cell lymphoma, lymph nodes of multiple sites: Secondary | ICD-10-CM

## 2023-05-08 DIAGNOSIS — R0600 Dyspnea, unspecified: Secondary | ICD-10-CM | POA: Insufficient documentation

## 2023-05-08 DIAGNOSIS — C9110 Chronic lymphocytic leukemia of B-cell type not having achieved remission: Secondary | ICD-10-CM | POA: Insufficient documentation

## 2023-05-08 DIAGNOSIS — Z79899 Other long term (current) drug therapy: Secondary | ICD-10-CM | POA: Diagnosis not present

## 2023-05-08 DIAGNOSIS — N9489 Other specified conditions associated with female genital organs and menstrual cycle: Secondary | ICD-10-CM

## 2023-05-08 DIAGNOSIS — Z7901 Long term (current) use of anticoagulants: Secondary | ICD-10-CM | POA: Diagnosis not present

## 2023-05-08 DIAGNOSIS — D696 Thrombocytopenia, unspecified: Secondary | ICD-10-CM | POA: Diagnosis not present

## 2023-05-08 DIAGNOSIS — I4891 Unspecified atrial fibrillation: Secondary | ICD-10-CM | POA: Diagnosis not present

## 2023-05-08 LAB — LACTATE DEHYDROGENASE: LDH: 182 U/L (ref 98–192)

## 2023-05-08 LAB — CMP (CANCER CENTER ONLY)
ALT: 20 U/L (ref 0–44)
AST: 28 U/L (ref 15–41)
Albumin: 3.6 g/dL (ref 3.5–5.0)
Alkaline Phosphatase: 85 U/L (ref 38–126)
Anion gap: 7 (ref 5–15)
BUN: 23 mg/dL (ref 8–23)
CO2: 24 mmol/L (ref 22–32)
Calcium: 9 mg/dL (ref 8.9–10.3)
Chloride: 104 mmol/L (ref 98–111)
Creatinine: 1.21 mg/dL — ABNORMAL HIGH (ref 0.44–1.00)
GFR, Estimated: 44 mL/min — ABNORMAL LOW (ref >60)
Glucose, Bld: 116 mg/dL — ABNORMAL HIGH (ref 70–99)
Potassium: 4 mmol/L (ref 3.5–5.1)
Sodium: 135 mmol/L (ref 135–145)
Total Bilirubin: 0.7 mg/dL (ref 0.0–1.2)
Total Protein: 6.4 g/dL — ABNORMAL LOW (ref 6.5–8.1)

## 2023-05-08 LAB — BRAIN NATRIURETIC PEPTIDE: B Natriuretic Peptide: 256.6 pg/mL — ABNORMAL HIGH (ref 0.0–100.0)

## 2023-05-08 LAB — CBC WITH DIFFERENTIAL (CANCER CENTER ONLY)
Abs Immature Granulocytes: 0.03 10*3/uL (ref 0.00–0.07)
Basophils Absolute: 0.1 10*3/uL (ref 0.0–0.1)
Basophils Relative: 1 %
Eosinophils Absolute: 0.1 10*3/uL (ref 0.0–0.5)
Eosinophils Relative: 2 %
HCT: 26 % — ABNORMAL LOW (ref 36.0–46.0)
Hemoglobin: 7.4 g/dL — ABNORMAL LOW (ref 12.0–15.0)
Immature Granulocytes: 1 %
Lymphocytes Relative: 15 %
Lymphs Abs: 0.7 10*3/uL (ref 0.7–4.0)
MCH: 20.5 pg — ABNORMAL LOW (ref 26.0–34.0)
MCHC: 28.5 g/dL — ABNORMAL LOW (ref 30.0–36.0)
MCV: 72 fL — ABNORMAL LOW (ref 80.0–100.0)
Monocytes Absolute: 0.7 10*3/uL (ref 0.1–1.0)
Monocytes Relative: 15 %
Neutro Abs: 2.9 10*3/uL (ref 1.7–7.7)
Neutrophils Relative %: 66 %
Platelet Count: 98 10*3/uL — ABNORMAL LOW (ref 150–400)
RBC: 3.61 MIL/uL — ABNORMAL LOW (ref 3.87–5.11)
RDW: 17.2 % — ABNORMAL HIGH (ref 11.5–15.5)
WBC Count: 4.4 10*3/uL (ref 4.0–10.5)
nRBC: 0 % (ref 0.0–0.2)

## 2023-05-08 NOTE — Assessment & Plan Note (Addendum)
#   Small B-cell lymphocytic lymphoma low-grade- stage IV-  AUG 2025- CT CAP- increasing lymphadenopathy in the chest, abdomen and pelvis, most evident in the chest, concerning  for recurrent disease. Currently on Brukinsa [80 mg- 3 pills a day; since Mid sep 2024]-Clinical response noted with improvement of the neck and axillary adenopathy.  However noted to have increasing bruising/bleeding/fluid retention.  See below  Given fluid retention/bleeding/bruising [leg swelling; shortness of breath]-HOLD Brukinsa for now.   # Dyspnea- fluid retention-recommend holding Brukinsaa.  Check BNP.  Await cardiology evaluation.  I have reached out to cardiologist to discuss my concerns.  # A . Fib-stable continue Xarelto [Jacqueline Webb]-on Xarelto to 10- stable. 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    #Longstanding adnexal mass on the LEFT increasing in size slowly over time. 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  Discussed with Kristi, will make a referral to gynecology oncology.  Appointment tomorrow.  # DISPOSITION: # ADD BNP today  # follow up TBD- Dr.B

## 2023-05-08 NOTE — Progress Notes (Signed)
 Patients legs and feet are both swollen, ongoing for a couple of months now. Shortness of breath, which she does have an appointment with her cardiologist after she leaves here today. She has stopped taking her Brukinsa treatment medication, due to having such swelling along with bruising.

## 2023-05-08 NOTE — Progress Notes (Signed)
 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 Lymphoma low-grade, status Rituxan therapy. 2. Recurrent disease by clinical examination in December of 2012 3. Ovarian mass on PET scan in December of 2012 (normal CA 125). Ovarian mass has resolved after rituximab therapy. 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  Patient had total 5 cycles of chemotherapy with bendamustine and rituximab and 6 cycle was omitted because of significant side effects with and weakness and fatigue.  # OCT 2016- PET significant response; ON surveillance  # 18th OCT 2018- Gazyva;SEP 2018-/OCT 2018 CT scan- progression; s/p 6 cycles [#6 in April 2019]  # JAN 2020-CT scan shows progressive disease; start a acalabrutinib 100mg  BID;# feb 6th2020- decrease dose of acalbrutinib to 100 mg/day [sec to spon bruising] March 1st week- STOPPED Acala sec to spon brusing  # MARCH 17th 2020- Start Venetoclax [out pt ramp up]; 300 mg a day; Discontiued  in AUg 2024. CT CAP-N: AUG 2024-progressive lymphadenopathy.   # Brukinsa- MID  SEp 2024- 3 pills a day - STOPPED Brukinsa  sec to spon brusing in April 2025.    # 11/06/2016- colonoscopy [Dr.Byrnett- 2 polyps]  #History of A. Fib-Xarelto; CKD stage II-III.   # #Right posterior neck melanoma stage I [ Dr.Lee/Elkins]..   -------------------------------------------------------------------    # left ? Adnexal mass- ? Etiology; mild SUV uptake incidental [stable compared to previous PET in 2016] Previous workup 2015- vaginal ultrasound negative. MRI February 2018- approximately 3 cm in size; slow increase in the size of the left adnexal mass over at least 3 years- suspect involvement of lymphoma rather than a primary gynecologic malignancy. --------------------------------------------------------------   DIAGNOSIS: SMALL LYMPHOCYTIC LEUKEMIA  STAGE:  IV    ;GOALS: control/pallaitive  CURRENT/MOST RECENT THERAPY: Venatoclax.    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   Jacqueline Webb 83 y.o.  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  She has stopped taking her Brukinsa treatment medication in 1st week of April. Pt stopped due to having such swelling along with bruising. Noted no bleeding gums or rectal bleeding.    Patients legs and feet are both swollen, ongoing for a couple of months now. Patient complains of ongoing Shortness of breath, which she does have an appointment with her cardiologist after she leaves here today.   No fever no chills.  Otherwise no lumps or bumps.  No  abdominal pain nausea vomiting.   No diarrhea.  No fever no chills.  No nausea no vomiting.  Review of Systems  Constitutional:  Positive for malaise/fatigue and  weight loss. Negative for chills, diaphoresis and fever.  HENT:  Negative for nosebleeds and sore throat.   Eyes:  Negative for double vision.  Respiratory:  Negative for cough, hemoptysis, sputum production, shortness of breath and wheezing.   Cardiovascular:  Positive for leg swelling. Negative for chest pain, palpitations and orthopnea.  Gastrointestinal:  Negative for abdominal pain, blood in stool, constipation, diarrhea, heartburn, melena and vomiting.  Genitourinary:  Negative for dysuria, frequency and urgency.  Musculoskeletal:  Positive for back pain and joint pain.  Neurological:  Negative for dizziness, tingling, focal weakness, weakness and headaches.  Psychiatric/Behavioral:  Negative for depression. The patient is not nervous/anxious and does not have insomnia.     PAST MEDICAL HISTORY :  Past Medical History:  Diagnosis Date   A-fib [facility])    Benign neoplasm of rectum and anal canal    Chronic leukemia of unspecified cell type, without mention of having achieved remission    Dysrhythmia    A-Fib   Hypercholesteremia    Hypertension    Hyperthyroidism    Lymphoma (HCC) 2007   Non Hodgkin's lymphoma (HCC) 01/22/2006   Personal history of chemotherapy     PAST SURGICAL HISTORY :   Past Surgical History:  Procedure Laterality Date   ABDOMINAL HYSTERECTOMY  1958   APPENDECTOMY     BREAST BIOPSY Right 1970   right breast biopsy with clip- neg   BREAST BIOPSY Left 2002   left breast biopsy with clip-neg   BREAST BIOPSY Right 2014   right breast biopsy with clip-neg   BREAST CYST ASPIRATION Bilateral 2000   bilateral fine needle aspiration   BREAST SURGERY Left 1990   biopsy   BREAST SURGERY Right May 08, 2012   complex fibroadenoma without malignancy.   CARDIAC CATHETERIZATION  2014   COLONOSCOPY  2009, 2012    hyperplastic rectal polyp 2012 tubular adenoma of proximal ascending colon 2000 9 repeat exam due to 2017.   COLONOSCOPY WITH PROPOFOL N/A 11/06/2016    Procedure: COLONOSCOPY WITH PROPOFOL;  Surgeon: [name], MD;  Location: [facility] ENDOSCOPY;  Service: Endoscopy;  Laterality: N/A;   COLONOSCOPY WITH PROPOFOL N/A 02/27/2019   Procedure: COLONOSCOPY WITH PROPOFOL;  Surgeon: [name], MD;  Location: [facility] ENDOSCOPY;  Service: General;  Laterality: N/A;   CORONARY ARTERY BYPASS GRAFT  1999   FLEXIBLE SIGMOIDOSCOPY  1998    FAMILY HISTORY :   Family History  Problem Relation Age of Onset   Heart disease Mother        Died age 49   Asthma Mother    Lung cancer Father        Died age 36   Heart attack Sister    Tuberculosis Sister        51 years old   Lung cancer Sister        Died in her 23s   Angina Sister    Breast cancer Sister 38   Rheum arthritis Sister    Aneurysm Sister        Brain, died age 68   Angina Sister    CAD Brother        double bypass   Heart disease Brother        Stent placement   Heart disease Brother  stent placement   Cancer Niece        breast cancer niece, pancreatic cancer niece   Breast cancer Niece    Ovarian cancer Neg Hx    Diabetes Neg Hx     SOCIAL HISTORY:   Social History   Tobacco Use   Smoking status: Never   Smokeless tobacco: Never  Vaping Use   Vaping status: Never Used  Substance Use Topics   Alcohol use: Yes    Alcohol/week: 0.0 - 7.0 standard drinks of alcohol    Comment: 1 drink a day of wine or cocktail   Drug use: No    ALLERGIES:  is allergic to pravastatin.  MEDICATIONS:  Current Outpatient Medications  Medication Sig Dispense Refill   Docusate Calcium (STOOL SOFTENER PO) Take by mouth as needed.     ezetimibe (ZETIA) 10 MG tablet Take 1 tablet (10 mg total) daily by mouth. 30 tablet 12   furosemide (LASIX) 20 MG tablet Take 1 tablet (20 mg total) by mouth every morning. 30 tablet 5   isosorbide mononitrate (IMDUR) 30 MG 24 hr tablet Take 30 mg by mouth daily.     levothyroxine (SYNTHROID) 50 MCG tablet Take 50 mcg by mouth daily before  breakfast.     lisinopril-hydrochlorothiazide (ZESTORETIC) 20-12.5 MG tablet TAKE 1 TABLET BY MOUTH DAILY 30 tablet 0   melatonin 5 MG TABS Take 5 mg by mouth.     metoprolol succinate (TOPROL-XL) 25 MG 24 hr tablet Take 25 mg by mouth daily.      ondansetron (ZOFRAN) 4 MG tablet Take 1 tablet (4 mg total) by mouth every 8 (eight) hours as needed for nausea or vomiting. 40 tablet 3   oxybutynin (DITROPAN) 5 MG tablet Take 1 tablet (5 mg total) by mouth 2 (two) times daily. 180 tablet 0   rosuvastatin (CRESTOR) 5 MG tablet Take 5 mg by mouth daily. Takes 3 times a week     XARELTO 20 MG TABS tablet Take 20 mg by mouth daily.     zanubrutinib (BRUKINSA) 80 MG capsule Take 3 capsules (240 mg total) by mouth daily. (Patient not taking: Reported on 05/08/2023) 90 capsule 0   No current facility-administered medications for this visit.   Facility-Administered Medications Ordered in Other Visits  Medication Dose Route Frequency Provider Last Rate Last Admin   heparin lock flush 100 unit/mL  500 Units Intravenous Once Rena Sweeden R, MD       sodium chloride flush (NS) 0.9 % injection 10 mL  10 mL Intravenous PRN Bobbe Quilter R, MD        PHYSICAL EXAMINATION: ECOG PERFORMANCE STATUS: 1 - Symptomatic but completely ambulatory  BP 125/69 (BP Location: Right Arm, Patient Position: Sitting, Cuff Size: Normal)   Pulse 81   Temp 97.6 F (36.4 C) (Tympanic)   Resp 16   Wt 148 lb (67.1 kg)   SpO2 100%   BMI 24.63 kg/m   Filed Weights   05/08/23 0953  Weight: 148 lb (67.1 kg)   Improvement/resolution of the neck and axillary adenopathy.  Bilateral leg swelling.  Clinically not suggestive of DVT.  Physical Exam HENT:     Head: Normocephalic and atraumatic.     Mouth/Throat:     Pharynx: No oropharyngeal exudate.  Eyes:     Pupils: Pupils are equal, round, and reactive to light.  Cardiovascular:     Rate and Rhythm: Normal rate and regular rhythm.  Pulmonary:     Effort:  No  respiratory distress.     Breath sounds: No wheezing.  Abdominal:     General: Bowel sounds are normal. There is no distension.     Palpations: Abdomen is soft. There is no mass.     Tenderness: There is no abdominal tenderness. There is no guarding or rebound.  Musculoskeletal:        General: No tenderness. Normal range of motion.     Cervical back: Normal range of motion and neck supple.  Skin:    General: Skin is warm.  Neurological:     Mental Status: She is alert and oriented to person, place, and time.  Psychiatric:        Mood and Affect: Affect normal.     LABORATORY DATA:  I have reviewed the data as listed    Component Value Date/Time   NA 135 05/08/2023 0940   NA 139 07/03/2017 1425   NA 141 05/11/2014 0853   K 4.0 05/08/2023 0940   K 3.6 05/11/2014 0853   CL 104 05/08/2023 0940   CL 103 05/11/2014 0853   CO2 24 05/08/2023 0940   CO2 29 05/11/2014 0853   GLUCOSE 116 (H) 05/08/2023 0940   GLUCOSE 107 (H) 05/11/2014 0853   BUN 23 05/08/2023 0940   BUN 19 07/03/2017 1425   BUN 22 (H) 05/11/2014 0853   CREATININE 1.21 (H) 05/08/2023 0940   CREATININE 1.07 (H) 05/11/2014 0853   CALCIUM 9.0 05/08/2023 0940   CALCIUM 10.1 05/11/2014 0853   PROT 6.4 (L) 05/08/2023 0940   PROT 6.4 07/03/2017 1425   PROT 7.3 05/11/2014 0853   ALBUMIN 3.6 05/08/2023 0940   ALBUMIN 4.3 07/03/2017 1425   ALBUMIN 4.7 05/11/2014 0853   AST 28 05/08/2023 0940   ALT 20 05/08/2023 0940   ALT 20 05/11/2014 0853   ALKPHOS 85 05/08/2023 0940   ALKPHOS 47 05/11/2014 0853   BILITOT 0.7 05/08/2023 0940   GFRNONAA 44 (L) 05/08/2023 0940   GFRNONAA 51 (L) 05/11/2014 0853   GFRAA >60 08/05/2019 0941   GFRAA 59 (L) 05/11/2014 0853    No results found for: "SPEP", "UPEP"  Lab Results  Component Value Date   WBC 4.4 05/08/2023   NEUTROABS 2.9 05/08/2023   HGB 7.4 (L) 05/08/2023   HCT 26.0 (L) 05/08/2023   MCV 72.0 (L) 05/08/2023   PLT 98 (L) 05/08/2023      Chemistry       Component Value Date/Time   NA 135 05/08/2023 0940   NA 139 07/03/2017 1425   NA 141 05/11/2014 0853   K 4.0 05/08/2023 0940   K 3.6 05/11/2014 0853   CL 104 05/08/2023 0940   CL 103 05/11/2014 0853   CO2 24 05/08/2023 0940   CO2 29 05/11/2014 0853   BUN 23 05/08/2023 0940   BUN 19 07/03/2017 1425   BUN 22 (H) 05/11/2014 0853   CREATININE 1.21 (H) 05/08/2023 0940   CREATININE 1.07 (H) 05/11/2014 0853   GLU 98 05/25/2013 0000      Component Value Date/Time   CALCIUM 9.0 05/08/2023 0940   CALCIUM 10.1 05/11/2014 0853   ALKPHOS 85 05/08/2023 0940   ALKPHOS 47 05/11/2014 0853   AST 28 05/08/2023 0940   ALT 20 05/08/2023 0940   ALT 20 05/11/2014 0853   BILITOT 0.7 05/08/2023 0940       RADIOGRAPHIC STUDIES: I have personally reviewed the radiological images as listed and agreed with the findings in the report. No results found.  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 Currently on Brukinsa [80 mg- 3 pills a day; since Mid sep 2024]-Clinical response noted with improvement of the neck and axillary adenopathy.  However noted to have increasing bruising/bleeding/fluid retention.  See below  Given fluid retention/bleeding/bruising [leg swelling; shortness of breath]-HOLD Brukinsa for now.   # Dyspnea- fluid retention-recommend holding Brukinsaa.  Check BNP.  Await cardiology evaluation.  I have reached out to cardiologist to discuss my concerns.  # A . Fib-stable continue Xarelto [Amanda Steiner]-on Xarelto to 10- stable. 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    #Longstanding adnexal mass on the LEFT increasing in size slowly over time. 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  Discussed with Kristi, will make a referral to gynecology oncology.  Appointment tomorrow.  # DISPOSITION: # ADD BNP today  # follow up TBD- Dr.B       Orders Placed This Encounter  Procedures   Brain natriuretic peptide    Standing Status:   Future    Number of Occurrences:   1    Expected Date:   05/08/2023    Expiration Date:   05/07/2024    All questions were answered. The patient knows to call the clinic with any problems, questions or concerns.      Gwyn Leos, MD 05/08/2023 12:26 PM

## 2023-05-13 ENCOUNTER — Other Ambulatory Visit: Payer: Self-pay

## 2023-05-14 ENCOUNTER — Other Ambulatory Visit: Payer: Self-pay

## 2023-05-16 ENCOUNTER — Other Ambulatory Visit: Payer: Self-pay

## 2023-05-20 ENCOUNTER — Other Ambulatory Visit: Payer: Self-pay | Admitting: Internal Medicine

## 2023-05-20 ENCOUNTER — Other Ambulatory Visit: Payer: Self-pay

## 2023-05-20 ENCOUNTER — Telehealth: Payer: Self-pay | Admitting: *Deleted

## 2023-05-20 ENCOUNTER — Other Ambulatory Visit (HOSPITAL_COMMUNITY): Payer: Self-pay

## 2023-05-20 DIAGNOSIS — D649 Anemia, unspecified: Secondary | ICD-10-CM | POA: Insufficient documentation

## 2023-05-20 NOTE — Telephone Encounter (Signed)
 She wants the results about the labs she had 4/16. She was not about to wait or the results because she had to go to another MD appt. She would like to get a call about the results

## 2023-05-20 NOTE — Telephone Encounter (Signed)
 Labs entered.

## 2023-05-20 NOTE — Telephone Encounter (Signed)
 I spoke to patient regarding her labs-patient s/p diuretics.  With cardiology.  Hemoglobin 7.4 MCV 70 suspect chronic iron deficiency.  No active bleeding.  Recommend oral iron.  [place]  [place] will [place] know when she is here in the next couple weeks-please make an appointment next available  MD/APP- labs- cbc/cmp; iron studies; ferritin; Possible venofer-

## 2023-05-27 ENCOUNTER — Inpatient Hospital Stay: Attending: Internal Medicine

## 2023-05-27 ENCOUNTER — Inpatient Hospital Stay

## 2023-05-27 ENCOUNTER — Other Ambulatory Visit: Payer: Self-pay | Admitting: *Deleted

## 2023-05-27 ENCOUNTER — Inpatient Hospital Stay (HOSPITAL_BASED_OUTPATIENT_CLINIC_OR_DEPARTMENT_OTHER): Admitting: Nurse Practitioner

## 2023-05-27 ENCOUNTER — Other Ambulatory Visit: Payer: Self-pay

## 2023-05-27 VITALS — BP 112/62 | HR 60 | Temp 96.0°F | Resp 19

## 2023-05-27 VITALS — BP 106/56 | HR 69 | Resp 18 | Wt 145.0 lb

## 2023-05-27 DIAGNOSIS — Z8572 Personal history of non-Hodgkin lymphomas: Secondary | ICD-10-CM | POA: Diagnosis present

## 2023-05-27 DIAGNOSIS — C8308 Small cell B-cell lymphoma, lymph nodes of multiple sites: Secondary | ICD-10-CM

## 2023-05-27 DIAGNOSIS — D509 Iron deficiency anemia, unspecified: Secondary | ICD-10-CM | POA: Diagnosis not present

## 2023-05-27 DIAGNOSIS — D649 Anemia, unspecified: Secondary | ICD-10-CM

## 2023-05-27 DIAGNOSIS — Z7189 Other specified counseling: Secondary | ICD-10-CM

## 2023-05-27 LAB — CBC WITH DIFFERENTIAL (CANCER CENTER ONLY)
Abs Immature Granulocytes: 0.02 10*3/uL (ref 0.00–0.07)
Basophils Absolute: 0.1 10*3/uL (ref 0.0–0.1)
Basophils Relative: 1 %
Eosinophils Absolute: 0.2 10*3/uL (ref 0.0–0.5)
Eosinophils Relative: 3 %
HCT: 25.4 % — ABNORMAL LOW (ref 36.0–46.0)
Hemoglobin: 7.2 g/dL — ABNORMAL LOW (ref 12.0–15.0)
Immature Granulocytes: 1 %
Lymphocytes Relative: 18 %
Lymphs Abs: 0.8 10*3/uL (ref 0.7–4.0)
MCH: 19.4 pg — ABNORMAL LOW (ref 26.0–34.0)
MCHC: 28.3 g/dL — ABNORMAL LOW (ref 30.0–36.0)
MCV: 68.3 fL — ABNORMAL LOW (ref 80.0–100.0)
Monocytes Absolute: 0.4 10*3/uL (ref 0.1–1.0)
Monocytes Relative: 10 %
Neutro Abs: 2.9 10*3/uL (ref 1.7–7.7)
Neutrophils Relative %: 67 %
Platelet Count: 77 10*3/uL — ABNORMAL LOW (ref 150–400)
RBC: 3.72 MIL/uL — ABNORMAL LOW (ref 3.87–5.11)
RDW: 17.6 % — ABNORMAL HIGH (ref 11.5–15.5)
WBC Count: 4.4 10*3/uL (ref 4.0–10.5)
nRBC: 0 % (ref 0.0–0.2)

## 2023-05-27 LAB — CMP (CANCER CENTER ONLY)
ALT: 24 U/L (ref 0–44)
AST: 34 U/L (ref 15–41)
Albumin: 3.5 g/dL (ref 3.5–5.0)
Alkaline Phosphatase: 84 U/L (ref 38–126)
Anion gap: 9 (ref 5–15)
BUN: 25 mg/dL — ABNORMAL HIGH (ref 8–23)
CO2: 21 mmol/L — ABNORMAL LOW (ref 22–32)
Calcium: 8.7 mg/dL — ABNORMAL LOW (ref 8.9–10.3)
Chloride: 104 mmol/L (ref 98–111)
Creatinine: 1.19 mg/dL — ABNORMAL HIGH (ref 0.44–1.00)
GFR, Estimated: 45 mL/min — ABNORMAL LOW (ref >60)
Glucose, Bld: 113 mg/dL — ABNORMAL HIGH (ref 70–99)
Potassium: 4 mmol/L (ref 3.5–5.1)
Sodium: 134 mmol/L — ABNORMAL LOW (ref 135–145)
Total Bilirubin: 0.8 mg/dL (ref 0.0–1.2)
Total Protein: 6.2 g/dL — ABNORMAL LOW (ref 6.5–8.1)

## 2023-05-27 LAB — FERRITIN: Ferritin: 5 ng/mL — ABNORMAL LOW (ref 11–307)

## 2023-05-27 LAB — IRON AND TIBC
Iron: 23 ug/dL — ABNORMAL LOW (ref 28–170)
Saturation Ratios: 5 % — ABNORMAL LOW (ref 10.4–31.8)
TIBC: 498 ug/dL — ABNORMAL HIGH (ref 250–450)
UIBC: 475 ug/dL

## 2023-05-27 MED ORDER — IRON SUCROSE 20 MG/ML IV SOLN
200.0000 mg | Freq: Once | INTRAVENOUS | Status: AC
  Administered 2023-05-27: 200 mg via INTRAVENOUS

## 2023-05-27 MED ORDER — SODIUM CHLORIDE 0.9% FLUSH
10.0000 mL | Freq: Once | INTRAVENOUS | Status: AC | PRN
  Administered 2023-05-27: 10 mL
  Filled 2023-05-27: qty 10

## 2023-05-27 NOTE — Progress Notes (Unsigned)
 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 Lymphoma low-grade, status Rituxan  therapy. 2. Recurrent disease by clinical examination in December of 2012 3. Ovarian mass on PET scan in December of 2012 (normal CA 125). Ovarian mass has resolved after rituximab  therapy. 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, 2016) 6, patient started on bendamustine  and Rituxan  because of progressive disease and symptomatic disease (May 11, 2014) 7.  Patient had total 5 cycles of chemotherapy with bendamustine  and rituximab  and 6 cycle was omitted because of significant side effects with and weakness and fatigue.  # OCT 2016- PET significant response; ON surveillance  # 18th OCT 2018- Gazyva ;SEP 2018-/OCT 2018 CT scan- progression; s/p 6 cycles [#6 in April 2019]  # JAN 2020-CT scan shows progressive disease; start a acalabrutinib  100mg  BID;# feb 6th2020- decrease dose of acalbrutinib to 100 mg/day [sec to spon bruising] March 1st week- STOPPED Acala sec to spon brusing  # MARCH 17th 2020- Start Venetoclax  [out pt ramp up]; 300 mg a day; Discontiued  in AUg 2024. CT CAP-N: AUG 2024-progressive lymphadenopathy.   # Brukinsa - MID SEp  2024- 3 pills a day - STOPPED Brukinsa   sec to spon brusing in April 2025.    # 11/06/2016- colonoscopy [Dr.Byrnett- 2 polyps]  #History of A. Fib-Xarelto; CKD stage II-III.   # #Right posterior neck melanoma stage I [ Dr.Lee/Elkins]..   -------------------------------------------------------------------    # left ? Adnexal mass- ? Etiology; mild SUV uptake incidental [stable compared to previous PET in 2016] Previous workup 2015- vaginal ultrasound negative. MRI February 2018- approximately 3 cm in size; slow increase in the size of the left adnexal mass over at least 3 years- suspect involvement of lymphoma rather than a primary gynecologic malignancy. --------------------------------------------------------------   DIAGNOSIS: SMALL LYMPHOCYTIC LEUKEMIA  STAGE:  IV    ;GOALS: control/pallaitive  CURRENT/MOST RECENT THERAPY: Venatoclax.    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   Jacqueline Webb 83 y.o. female patient with above history of SLL/CLL on Brukinsa , also xarelto for her a-fib, who returns to clinic for follow up. In interim she was found to have hemoglobin 7.4. She denies active bleeding. She was having increased fluid retention, bruising and brukinsa  has been held since mid-April. Baseline hemoglobin around 11-13. She has chronic thrombocytopenia with platelets in the 70s-90s. She has increased shortness of breath and is unable to do her usual activities due to fatigue and breathlessness. Denies fevers or chills. No new lumps or bumps. Denies abdominal pain, nausea, or vomiting. Denies diarrhea or constipation. Denies vaginal bleeding or hematuria.  Review of Systems  Constitutional:  Positive for malaise/fatigue and  weight loss. Negative for chills, diaphoresis and fever.  HENT:  Negative for nosebleeds and sore throat.   Eyes:  Negative for double vision.  Respiratory:  Positive for shortness of breath. Negative for cough, hemoptysis, sputum production and wheezing.   Cardiovascular:  Positive for leg swelling. Negative for chest pain, palpitations and orthopnea.  Gastrointestinal:  Negative for abdominal pain, blood in stool, constipation, diarrhea, heartburn, melena, nausea and vomiting.  Genitourinary:  Negative for dysuria, frequency, hematuria and urgency.  Musculoskeletal:  Positive for back pain and joint pain. Negative for falls.  Skin:  Negative for rash.  Neurological:  Negative for dizziness, tingling, focal weakness, weakness and headaches.  Endo/Heme/Allergies:  Bruises/bleeds easily.  Psychiatric/Behavioral:  Negative for depression. The patient is not nervous/anxious and does not have insomnia.     PAST MEDICAL HISTORY :  Past Medical History:  Diagnosis Date   A-fib [facility])    Benign neoplasm of rectum and anal canal    Chronic leukemia of unspecified cell type, without mention of having achieved remission    Dysrhythmia    A-Fib   Hypercholesteremia    Hypertension    Hyperthyroidism    Lymphoma (HCC) 2007   Non Hodgkin's lymphoma (HCC) 01/22/2006   Personal history of chemotherapy     PAST SURGICAL HISTORY :   Past Surgical History:  Procedure Laterality Date   ABDOMINAL HYSTERECTOMY  1958   APPENDECTOMY     BREAST BIOPSY Right 1970   right breast biopsy with clip- neg   BREAST BIOPSY Left 2002   left breast biopsy with clip-neg   BREAST BIOPSY Right 2014   right breast biopsy with clip-neg   BREAST CYST ASPIRATION Bilateral 2000   bilateral fine needle aspiration   BREAST SURGERY Left 1990   biopsy   BREAST SURGERY Right May 08, 2012   complex fibroadenoma without malignancy.   CARDIAC CATHETERIZATION  2014   COLONOSCOPY  2009, 2012    hyperplastic rectal  polyp 2012 tubular adenoma of proximal ascending colon 2000 9 repeat exam due to 2017.   COLONOSCOPY WITH PROPOFOL  N/A 11/06/2016   Procedure: COLONOSCOPY WITH PROPOFOL ;  Surgeon: Marshall Skeeter, MD;  Location: [facility] ENDOSCOPY;  Service: Endoscopy;  Laterality: N/A;   COLONOSCOPY WITH PROPOFOL  N/A 02/27/2019   Procedure: COLONOSCOPY WITH PROPOFOL ;  Surgeon: Marshall Skeeter, MD;  Location: [facility] ENDOSCOPY;  Service: General;  Laterality: N/A;   CORONARY ARTERY BYPASS GRAFT  1999   FLEXIBLE SIGMOIDOSCOPY  1998    FAMILY HISTORY :   Family History  Problem Relation Age of Onset   Heart disease Mother        Died age 85   Asthma Mother    Lung cancer Father        Died age 36   Heart attack Sister    Tuberculosis Sister        57 years old   Lung cancer Sister        Died in her 29s   Angina Sister    Breast cancer Sister 66   Rheum arthritis Sister    Aneurysm Sister        Brain, died age 73   Angina Sister    CAD Brother        double bypass   Heart disease Brother        Stent placement   Heart disease Brother  stent placement   Cancer Niece        breast cancer niece, pancreatic cancer niece   Breast cancer Niece    Ovarian cancer Neg Hx    Diabetes Neg Hx     SOCIAL HISTORY:   Social History   Tobacco Use   Smoking status: Never   Smokeless tobacco: Never  Vaping Use   Vaping status: Never Used  Substance Use Topics   Alcohol  use: Yes    Alcohol /week: 0.0 - 7.0 standard drinks of alcohol     Comment: 1 drink a day of wine or cocktail   Drug use: No    ALLERGIES:  is allergic to pravastatin.  MEDICATIONS:  Current Outpatient Medications  Medication Sig Dispense Refill   Docusate Calcium (STOOL SOFTENER PO) Take by mouth as needed.     ezetimibe  (ZETIA ) 10 MG tablet Take 1 tablet (10 mg total) daily by mouth. 30 tablet 12   furosemide  (LASIX ) 20 MG tablet Take 1 tablet (20 mg total) by mouth every morning. 30 tablet 5   isosorbide mononitrate  (IMDUR) 30 MG 24 hr tablet Take 30 mg by mouth daily.     levothyroxine  (SYNTHROID ) 50 MCG tablet Take 50 mcg by mouth daily before breakfast.     lisinopril -hydrochlorothiazide  (ZESTORETIC ) 20-12.5 MG tablet TAKE 1 TABLET BY MOUTH DAILY 30 tablet 0   melatonin 5 MG TABS Take 5 mg by mouth.     metoprolol succinate (TOPROL-XL) 25 MG 24 hr tablet Take 25 mg by mouth daily.      ondansetron  (ZOFRAN ) 4 MG tablet Take 1 tablet (4 mg total) by mouth every 8 (eight) hours as needed for nausea or vomiting. 40 tablet 3   oxybutynin  (DITROPAN ) 5 MG tablet Take 1 tablet (5 mg total) by mouth 2 (two) times daily. 180 tablet 0   rosuvastatin  (CRESTOR ) 5 MG tablet Take 5 mg by mouth daily. Takes 3 times a week     XARELTO 20 MG TABS tablet Take 20 mg by mouth daily.     zanubrutinib  (BRUKINSA ) 80 MG capsule Take 3 capsules (240 mg total) by mouth daily. (Patient not taking: Reported on 05/08/2023) 90 capsule 0   No current facility-administered medications for this visit.   Facility-Administered Medications Ordered in Other Visits  Medication Dose Route Frequency Provider Last Rate Last Admin   heparin  lock flush 100 unit/mL  500 Units Intravenous Once Brahmanday, Govinda R, MD       sodium chloride  flush (NS) 0.9 % injection 10 mL  10 mL Intravenous PRN Brahmanday, Govinda R, MD        PHYSICAL EXAMINATION: ECOG PERFORMANCE STATUS: 1 - Symptomatic but completely ambulatory  BP (!) 106/56 (BP Location: Right Arm, Patient Position: Sitting)   Pulse 69   Resp 18   Wt 145 lb (65.8 kg)   SpO2 100%   BMI 24.13 kg/m   Filed Weights   05/27/23 1318  Weight: 145 lb (65.8 kg)    Physical Exam Vitals reviewed.  Constitutional:      Appearance: She is not ill-appearing.  HENT:     Head: Normocephalic and atraumatic.  Cardiovascular:     Rate and Rhythm: Normal rate and regular rhythm.  Pulmonary:     Effort: No respiratory distress.     Breath sounds: No wheezing.  Abdominal:     General: There  is no distension.     Palpations: Abdomen is soft.     Tenderness: There is no abdominal tenderness. There  is no guarding.  Musculoskeletal:        General: No tenderness.     Right lower leg: Edema present.     Left lower leg: Edema present.     Comments: Ambulating w/o aids  Skin:    General: Skin is warm.     Coloration: Skin is pale.  Neurological:     Mental Status: She is alert and oriented to person, place, and time.  Psychiatric:        Mood and Affect: Mood and affect normal.        Behavior: Behavior normal.    LABORATORY DATA:  I have reviewed the data as listed    Component Value Date/Time   NA 134 (L) 05/27/2023 1258   NA 139 07/03/2017 1425   NA 141 05/11/2014 0853   K 4.0 05/27/2023 1258   K 3.6 05/11/2014 0853   CL 104 05/27/2023 1258   CL 103 05/11/2014 0853   CO2 21 (L) 05/27/2023 1258   CO2 29 05/11/2014 0853   GLUCOSE 113 (H) 05/27/2023 1258   GLUCOSE 107 (H) 05/11/2014 0853   BUN 25 (H) 05/27/2023 1258   BUN 19 07/03/2017 1425   BUN 22 (H) 05/11/2014 0853   CREATININE 1.19 (H) 05/27/2023 1258   CREATININE 1.07 (H) 05/11/2014 0853   CALCIUM 8.7 (L) 05/27/2023 1258   CALCIUM 10.1 05/11/2014 0853   PROT 6.2 (L) 05/27/2023 1258   PROT 6.4 07/03/2017 1425   PROT 7.3 05/11/2014 0853   ALBUMIN 3.5 05/27/2023 1258   ALBUMIN 4.3 07/03/2017 1425   ALBUMIN 4.7 05/11/2014 0853   AST 34 05/27/2023 1258   ALT 24 05/27/2023 1258   ALT 20 05/11/2014 0853   ALKPHOS 84 05/27/2023 1258   ALKPHOS 47 05/11/2014 0853   BILITOT 0.8 05/27/2023 1258   GFRNONAA 45 (L) 05/27/2023 1258   GFRNONAA 51 (L) 05/11/2014 0853   GFRAA >60 08/05/2019 0941   GFRAA 59 (L) 05/11/2014 0853   Lab Results  Component Value Date   WBC 4.4 05/27/2023   NEUTROABS 2.9 05/27/2023   HGB 7.2 (L) 05/27/2023   HCT 25.4 (L) 05/27/2023   MCV 68.3 (L) 05/27/2023   PLT 77 (L) 05/27/2023   Iron/TIBC/Ferritin/ %Sat    Component Value Date/Time   IRON 23 (L) 05/27/2023 1258   TIBC 498  (H) 05/27/2023 1258   FERRITIN 5 (L) 05/27/2023 1258   IRONPCTSAT 5 (L) 05/27/2023 1258     RADIOGRAPHIC STUDIES: I have personally reviewed the radiological images as listed and agreed with the findings in the report. No results found.   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 Currently on Brukinsa  [80 mg- 3 pills a day; since Mid sep 2024]-Clinical response noted with improvement of the neck and axillary adenopathy.  However noted to have increasing bruising/bleeding/fluid retention.  Brukinsa  held. Ok to continue holding for now.    # Anemia- hmg 7.2. Baseline around 11-13. Ferritin 5, iron sat 5%. Started oral iron. Discussed option of starting IV iron. Discussed risks including possible anaphylaxis. She's in agreement to proceed. Start venofer 200 mg x 5. Receive weekly. Hold on interventional procedures currently given that she's asymptomatic. Would work up if ongoing drops in counts or no response to iv iron.    #  Dyspnea- fluid retention- Not improved with Brukinsa . BNP mildly elevated. Suspect dyspnea related to anemia.   # A. Fib-stable continue Xarelto [Amanda Steiner]-on Xarelto 20 mg- stable.    # Thrombocytopenia- 80s;  likely secondary to CLL-se below [anti-coagulation; Xarelto 20 mg a day]. Plt 77 today. Ok to continue xarelto for now. Monitor.    # Adnexal mass- long history. Slowly increased in size over time. 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 Previously referred to gyn onc for evaluation but declined appt. Consider surveillance imaging and can reevaluate in the future if symptomatic or ongoing enlargement.    # DISPOSITION: Venofer today Venofer weekly for next 4 weeks (she'll get a total of 5 infusions) 6  weeks- lab, Dr Valentine Gasmen, +/- venofer- la    No problem-specific Assessment & Plan notes found for this encounter.  No orders of the defined types were placed in this encounter.  All questions were answered. The patient knows to call the clinic with any problems, questions or concerns.     Nelda Balsam, NP 05/27/2023

## 2023-05-30 ENCOUNTER — Encounter: Payer: Self-pay | Admitting: Internal Medicine

## 2023-06-03 ENCOUNTER — Inpatient Hospital Stay

## 2023-06-03 VITALS — BP 119/77 | HR 76 | Temp 97.0°F | Resp 18

## 2023-06-03 DIAGNOSIS — Z7189 Other specified counseling: Secondary | ICD-10-CM

## 2023-06-03 DIAGNOSIS — C8308 Small cell B-cell lymphoma, lymph nodes of multiple sites: Secondary | ICD-10-CM

## 2023-06-03 DIAGNOSIS — D649 Anemia, unspecified: Secondary | ICD-10-CM | POA: Diagnosis not present

## 2023-06-03 MED ORDER — IRON SUCROSE 20 MG/ML IV SOLN
200.0000 mg | Freq: Once | INTRAVENOUS | Status: AC
  Administered 2023-06-03: 200 mg via INTRAVENOUS

## 2023-06-03 MED ORDER — SODIUM CHLORIDE 0.9% FLUSH
10.0000 mL | Freq: Once | INTRAVENOUS | Status: DC | PRN
  Filled 2023-06-03: qty 10

## 2023-06-03 NOTE — Patient Instructions (Signed)

## 2023-06-10 ENCOUNTER — Inpatient Hospital Stay

## 2023-06-10 VITALS — BP 117/63 | HR 71 | Temp 97.4°F | Resp 16

## 2023-06-10 DIAGNOSIS — D649 Anemia, unspecified: Secondary | ICD-10-CM | POA: Diagnosis not present

## 2023-06-10 DIAGNOSIS — Z7189 Other specified counseling: Secondary | ICD-10-CM

## 2023-06-10 DIAGNOSIS — C8308 Small cell B-cell lymphoma, lymph nodes of multiple sites: Secondary | ICD-10-CM

## 2023-06-10 MED ORDER — IRON SUCROSE 20 MG/ML IV SOLN
200.0000 mg | Freq: Once | INTRAVENOUS | Status: AC
  Administered 2023-06-10: 200 mg via INTRAVENOUS
  Filled 2023-06-10: qty 10

## 2023-06-24 ENCOUNTER — Inpatient Hospital Stay: Attending: Internal Medicine

## 2023-06-24 VITALS — BP 133/74 | HR 68 | Temp 96.5°F | Resp 17

## 2023-06-24 DIAGNOSIS — Z7189 Other specified counseling: Secondary | ICD-10-CM

## 2023-06-24 DIAGNOSIS — D696 Thrombocytopenia, unspecified: Secondary | ICD-10-CM | POA: Diagnosis present

## 2023-06-24 DIAGNOSIS — C8308 Small cell B-cell lymphoma, lymph nodes of multiple sites: Secondary | ICD-10-CM

## 2023-06-24 DIAGNOSIS — N182 Chronic kidney disease, stage 2 (mild): Secondary | ICD-10-CM | POA: Insufficient documentation

## 2023-06-24 DIAGNOSIS — I130 Hypertensive heart and chronic kidney disease with heart failure and stage 1 through stage 4 chronic kidney disease, or unspecified chronic kidney disease: Secondary | ICD-10-CM | POA: Diagnosis not present

## 2023-06-24 DIAGNOSIS — Z79899 Other long term (current) drug therapy: Secondary | ICD-10-CM | POA: Insufficient documentation

## 2023-06-24 DIAGNOSIS — Z8572 Personal history of non-Hodgkin lymphomas: Secondary | ICD-10-CM | POA: Diagnosis present

## 2023-06-24 DIAGNOSIS — Z7901 Long term (current) use of anticoagulants: Secondary | ICD-10-CM | POA: Diagnosis not present

## 2023-06-24 DIAGNOSIS — D509 Iron deficiency anemia, unspecified: Secondary | ICD-10-CM | POA: Diagnosis present

## 2023-06-24 DIAGNOSIS — I4891 Unspecified atrial fibrillation: Secondary | ICD-10-CM | POA: Diagnosis not present

## 2023-06-24 MED ORDER — SODIUM CHLORIDE 0.9% FLUSH
10.0000 mL | Freq: Once | INTRAVENOUS | Status: AC | PRN
  Administered 2023-06-24: 10 mL
  Filled 2023-06-24: qty 10

## 2023-06-24 MED ORDER — IRON SUCROSE 20 MG/ML IV SOLN
200.0000 mg | Freq: Once | INTRAVENOUS | Status: AC
  Administered 2023-06-24: 200 mg via INTRAVENOUS
  Filled 2023-06-24: qty 10

## 2023-07-01 ENCOUNTER — Inpatient Hospital Stay

## 2023-07-01 VITALS — BP 100/63 | HR 69 | Temp 97.7°F | Resp 17

## 2023-07-01 DIAGNOSIS — Z7189 Other specified counseling: Secondary | ICD-10-CM

## 2023-07-01 DIAGNOSIS — C8308 Small cell B-cell lymphoma, lymph nodes of multiple sites: Secondary | ICD-10-CM

## 2023-07-01 DIAGNOSIS — D509 Iron deficiency anemia, unspecified: Secondary | ICD-10-CM | POA: Diagnosis not present

## 2023-07-01 MED ORDER — IRON SUCROSE 20 MG/ML IV SOLN
200.0000 mg | Freq: Once | INTRAVENOUS | Status: AC
  Administered 2023-07-01: 200 mg via INTRAVENOUS

## 2023-07-01 MED ORDER — SODIUM CHLORIDE 0.9% FLUSH
10.0000 mL | Freq: Once | INTRAVENOUS | Status: AC | PRN
  Administered 2023-07-01: 10 mL
  Filled 2023-07-01: qty 10

## 2023-07-08 ENCOUNTER — Inpatient Hospital Stay

## 2023-07-08 ENCOUNTER — Encounter: Payer: Self-pay | Admitting: Internal Medicine

## 2023-07-08 ENCOUNTER — Other Ambulatory Visit: Payer: Self-pay

## 2023-07-08 ENCOUNTER — Inpatient Hospital Stay (HOSPITAL_BASED_OUTPATIENT_CLINIC_OR_DEPARTMENT_OTHER): Admitting: Internal Medicine

## 2023-07-08 VITALS — BP 150/80 | HR 77 | Temp 98.0°F | Resp 16 | Ht 65.0 in | Wt 142.0 lb

## 2023-07-08 VITALS — BP 162/63 | HR 54 | Temp 97.8°F

## 2023-07-08 DIAGNOSIS — D649 Anemia, unspecified: Secondary | ICD-10-CM

## 2023-07-08 DIAGNOSIS — Z9889 Other specified postprocedural states: Secondary | ICD-10-CM

## 2023-07-08 DIAGNOSIS — C8308 Small cell B-cell lymphoma, lymph nodes of multiple sites: Secondary | ICD-10-CM

## 2023-07-08 DIAGNOSIS — I4819 Other persistent atrial fibrillation: Secondary | ICD-10-CM | POA: Diagnosis not present

## 2023-07-08 DIAGNOSIS — I2489 Other forms of acute ischemic heart disease: Secondary | ICD-10-CM

## 2023-07-08 DIAGNOSIS — D509 Iron deficiency anemia, unspecified: Secondary | ICD-10-CM | POA: Diagnosis not present

## 2023-07-08 DIAGNOSIS — Z7189 Other specified counseling: Secondary | ICD-10-CM

## 2023-07-08 DIAGNOSIS — Q245 Malformation of coronary vessels: Secondary | ICD-10-CM | POA: Diagnosis not present

## 2023-07-08 DIAGNOSIS — I340 Nonrheumatic mitral (valve) insufficiency: Secondary | ICD-10-CM | POA: Diagnosis not present

## 2023-07-08 LAB — CBC WITH DIFFERENTIAL (CANCER CENTER ONLY)
Abs Immature Granulocytes: 0.01 10*3/uL (ref 0.00–0.07)
Basophils Absolute: 0.1 10*3/uL (ref 0.0–0.1)
Basophils Relative: 2 %
Eosinophils Absolute: 0.1 10*3/uL (ref 0.0–0.5)
Eosinophils Relative: 3 %
HCT: 33 % — ABNORMAL LOW (ref 36.0–46.0)
Hemoglobin: 9.8 g/dL — ABNORMAL LOW (ref 12.0–15.0)
Immature Granulocytes: 0 %
Lymphocytes Relative: 28 %
Lymphs Abs: 1.1 10*3/uL (ref 0.7–4.0)
MCH: 21.8 pg — ABNORMAL LOW (ref 26.0–34.0)
MCHC: 29.7 g/dL — ABNORMAL LOW (ref 30.0–36.0)
MCV: 73.5 fL — ABNORMAL LOW (ref 80.0–100.0)
Monocytes Absolute: 0.5 10*3/uL (ref 0.1–1.0)
Monocytes Relative: 12 %
Neutro Abs: 2.1 10*3/uL (ref 1.7–7.7)
Neutrophils Relative %: 55 %
Platelet Count: 85 10*3/uL — ABNORMAL LOW (ref 150–400)
RBC: 4.49 MIL/uL (ref 3.87–5.11)
RDW: 24.4 % — ABNORMAL HIGH (ref 11.5–15.5)
WBC Count: 3.9 10*3/uL — ABNORMAL LOW (ref 4.0–10.5)
nRBC: 0 % (ref 0.0–0.2)

## 2023-07-08 LAB — CMP (CANCER CENTER ONLY)
ALT: 20 U/L (ref 0–44)
AST: 30 U/L (ref 15–41)
Albumin: 3.7 g/dL (ref 3.5–5.0)
Alkaline Phosphatase: 85 U/L (ref 38–126)
Anion gap: 8 (ref 5–15)
BUN: 16 mg/dL (ref 8–23)
CO2: 23 mmol/L (ref 22–32)
Calcium: 9 mg/dL (ref 8.9–10.3)
Chloride: 101 mmol/L (ref 98–111)
Creatinine: 0.89 mg/dL (ref 0.44–1.00)
GFR, Estimated: >60 mL/min (ref >60)
Glucose, Bld: 97 mg/dL (ref 70–99)
Potassium: 4.1 mmol/L (ref 3.5–5.1)
Sodium: 132 mmol/L — ABNORMAL LOW (ref 135–145)
Total Bilirubin: 0.9 mg/dL (ref 0.0–1.2)
Total Protein: 6.3 g/dL — ABNORMAL LOW (ref 6.5–8.1)

## 2023-07-08 LAB — FERRITIN: Ferritin: 47 ng/mL (ref 11–307)

## 2023-07-08 LAB — IRON AND TIBC
Iron: 44 ug/dL (ref 28–170)
Saturation Ratios: 10 % — ABNORMAL LOW (ref 10.4–31.8)
TIBC: 421 ug/dL (ref 250–450)
UIBC: 377 ug/dL

## 2023-07-08 LAB — BRAIN NATRIURETIC PEPTIDE: B Natriuretic Peptide: 242.5 pg/mL — ABNORMAL HIGH (ref 0.0–100.0)

## 2023-07-08 MED ORDER — FUROSEMIDE 20 MG PO TABS: 20.0000 mg | ORAL_TABLET | ORAL | 1 refills | Status: DC

## 2023-07-08 MED ORDER — IRON SUCROSE 20 MG/ML IV SOLN
200.0000 mg | Freq: Once | INTRAVENOUS | Status: AC
  Administered 2023-07-08: 200 mg via INTRAVENOUS

## 2023-07-08 MED ORDER — SODIUM CHLORIDE 0.9% FLUSH
10.0000 mL | Freq: Once | INTRAVENOUS | Status: AC | PRN
  Filled 2023-07-08: qty 10

## 2023-07-08 NOTE — Assessment & Plan Note (Addendum)
#   Small B-cell lymphocytic lymphoma low-grade- stage IV-  AUG 2025- CT CAP- increasing lymphadenopathy in the chest, abdomen and pelvis, most evident in the chest, concerning  for recurrent disease.  Most recently on Brukinsa  [80 mg- 3 pills a day; since Mid sep 2024]-Clinical response noted with improvement of the neck and axillary adenopathy.  However noted to have increasing bruising/bleeding/fluid retention.  # Currently off Brukinsa  since April 2025.  Mild left axillary lymphadenopathy felt-but no obvious progression noted.  Will plan imaging again in few months./Next visit.  # Iron  deficiency anemia-unclear etiology [suspect chronic bleeding] s/p iron  infusion- improved- not resolved-hemoglobin today is 9.4.  Proceed with iron  infusion.  # CHF/chronic A . Fib-  Jacqueline Webb]-on Eliquis 2.5 mg BID- stable.  Recommend reinstating Lasix  20 mg a day daily-called in a prescription.  Check BNP today; and again  next visit.  # Thrombocytopenia- 80s;  likely secondary to CLL-se below [anti-coagulation;  Eliquis 2.5 mg BID y] /see above- stable.   # DISPOSITION: # BNP today # venofer  today # venofer  every 2 weeks- x 2 more   # follow up in 3  months- MD; labs-cbc/BMP- BNP; iron  studies;ferritin-- Dr.B

## 2023-07-08 NOTE — Progress Notes (Signed)
 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 Lymphoma low-grade, status Rituxan  therapy. 2. Recurrent disease by clinical examination in December of 2012 3. Ovarian mass on PET scan in December of 2012 (normal CA 125). Ovarian mass has resolved after rituximab  therapy. 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, 2016) 6, patient started on bendamustine  and Rituxan  because of progressive disease and symptomatic disease (May 11, 2014) 7.  Patient had total 5 cycles of chemotherapy with bendamustine  and rituximab  and 6 cycle was omitted because of significant side effects with and weakness and fatigue.  # OCT 2016- PET significant response; ON surveillance  # 18th OCT 2018- Gazyva ;SEP 2018-/OCT 2018 CT scan- progression; s/p 6 cycles [#6 in April 2019]  # JAN 2020-CT scan shows progressive disease; start a acalabrutinib  100mg  BID;# feb 6th2020- decrease dose of acalbrutinib to 100 mg/day [sec to spon bruising] March 1st week- STOPPED Acala sec to spon brusing  # MARCH 17th 2020- Start Venetoclax  [out pt ramp up]; 300 mg a day; Discontiued  in AUg 2024. CT CAP-N: AUG 2024-progressive lymphadenopathy.   # Brukinsa - MID  SEp 2024- 3 pills a day - STOPPED Brukinsa   sec to spon brusing in April 2025.    # 11/06/2016- colonoscopy [Dr.Byrnett- 2 polyps]  #History of A. Fib-Xarelto; CKD stage II-III.   # #Right posterior neck melanoma stage I [ Dr.Lee/Elkins]..   -------------------------------------------------------------------    # left ? Adnexal mass- ? Etiology; mild SUV uptake incidental [stable compared to previous PET in 2016] Previous workup 2015- vaginal ultrasound negative. MRI February 2018- approximately 3 cm in size; slow increase in the size of the left adnexal mass over at least 3 years- suspect involvement of lymphoma rather than a primary gynecologic malignancy. --------------------------------------------------------------   DIAGNOSIS: SMALL LYMPHOCYTIC LEUKEMIA  STAGE:  IV    ;GOALS: control/pallaitive  CURRENT/MOST RECENT THERAPY: Venatoclax.    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  Jacqueline Webb 83 y.o.  female pleasant patient above history of SLL/CLL OFF Brukinsa - since April 2025 and also on eliquis for A-fib; CHF- Iron  deficiency aneima s/p venofer   is is here for follow-up.  Patient is still having some swelling in her legs.  Patient was started on Lasix  for about 7 days also by cardiology.  Is currently off lasix .   Patient continues to have legs and feet are both swollen, ongoing for a couple of months now. Patient complains of ongoing Shortness of breath-overall improved but not resolved.  No fever no chills.  Otherwise no lumps or bumps.  No abdominal pain nausea vomiting.   No diarrhea.  No fever no chills.  No nausea no vomiting.  Review of Systems  Constitutional:  Positive for  malaise/fatigue and weight loss. Negative for chills, diaphoresis and fever.  HENT:  Negative for nosebleeds and sore throat.   Eyes:  Negative for double vision.  Respiratory:  Negative for cough, hemoptysis, sputum production, shortness of breath and wheezing.   Cardiovascular:  Positive for leg swelling. Negative for chest pain, palpitations and orthopnea.  Gastrointestinal:  Negative for abdominal pain, blood in stool, constipation, diarrhea, heartburn, melena and vomiting.  Genitourinary:  Negative for dysuria, frequency and urgency.  Musculoskeletal:  Positive for back pain and joint pain.  Neurological:  Negative for dizziness, tingling, focal weakness, weakness and headaches.  Psychiatric/Behavioral:  Negative for depression. The patient is not nervous/anxious and does not have insomnia.     PAST MEDICAL HISTORY :  Past Medical History:  Diagnosis Date   A-fib [facility])    Benign neoplasm of rectum and anal canal    Chronic leukemia of unspecified cell type, without mention of having achieved remission    Dysrhythmia    A-Fib   Hypercholesteremia    Hypertension    Hyperthyroidism    Lymphoma (HCC) 2007   Non Hodgkin's lymphoma (HCC) 01/22/2006   Personal history of chemotherapy     PAST SURGICAL HISTORY :   Past Surgical History:  Procedure Laterality Date   ABDOMINAL HYSTERECTOMY  1958   APPENDECTOMY     BREAST BIOPSY Right 1970   right breast biopsy with clip- neg   BREAST BIOPSY Left 2002   left breast biopsy with clip-neg   BREAST BIOPSY Right 2014   right breast biopsy with clip-neg   BREAST CYST ASPIRATION Bilateral 2000   bilateral fine needle aspiration   BREAST SURGERY Left 1990   biopsy   BREAST SURGERY Right May 08, 2012   complex fibroadenoma without malignancy.   CARDIAC CATHETERIZATION  2014   COLONOSCOPY  2009, 2012    hyperplastic rectal polyp 2012 tubular adenoma of proximal ascending colon 2000 9 repeat exam due to 2017.   COLONOSCOPY WITH  PROPOFOL  N/A 11/06/2016   Procedure: COLONOSCOPY WITH PROPOFOL ;  Surgeon: Marshall Skeeter, MD;  Location: [facility] ENDOSCOPY;  Service: Endoscopy;  Laterality: N/A;   COLONOSCOPY WITH PROPOFOL  N/A 02/27/2019   Procedure: COLONOSCOPY WITH PROPOFOL ;  Surgeon: Marshall Skeeter, MD;  Location: [facility] ENDOSCOPY;  Service: General;  Laterality: N/A;   CORONARY ARTERY BYPASS GRAFT  1999   FLEXIBLE SIGMOIDOSCOPY  1998    FAMILY HISTORY :   Family History  Problem Relation Age of Onset   Heart disease Mother        Died age 32   Asthma Mother    Lung cancer Father        Died age 79   Heart attack Sister    Tuberculosis Sister        2 years old   Lung cancer Sister        Died in her 1s   Angina Sister    Breast cancer Sister 32   Rheum arthritis Sister    Aneurysm Sister        Brain, died age 45   Angina Sister    CAD Brother        double bypass   Heart disease Brother        Stent placement   Heart disease Brother  stent placement   Cancer Niece        breast cancer niece, pancreatic cancer niece   Breast cancer Niece    Ovarian cancer Neg Hx    Diabetes Neg Hx     SOCIAL HISTORY:   Social History   Tobacco Use   Smoking status: Never   Smokeless tobacco: Never  Vaping Use   Vaping status: Never Used  Substance Use Topics   Alcohol  use: Yes    Alcohol /week: 0.0 - 7.0 standard drinks of alcohol     Comment: 1 drink a day of wine or cocktail   Drug use: No    ALLERGIES:  is allergic to pravastatin.  MEDICATIONS:  Current Outpatient Medications  Medication Sig Dispense Refill   apixaban (ELIQUIS) 2.5 MG TABS tablet Take 2.5 mg by mouth.     Docusate Calcium (STOOL SOFTENER PO) Take by mouth as needed.     isosorbide mononitrate (IMDUR) 30 MG 24 hr tablet Take 30 mg by mouth daily.     levothyroxine  (SYNTHROID ) 50 MCG tablet Take 50 mcg by mouth daily before breakfast.     lisinopril -hydrochlorothiazide  (ZESTORETIC ) 20-12.5 MG tablet TAKE 1 TABLET BY MOUTH  DAILY 30 tablet 0   melatonin 5 MG TABS Take 5 mg by mouth.     metoprolol succinate (TOPROL-XL) 25 MG 24 hr tablet Take 25 mg by mouth daily.      ondansetron  (ZOFRAN ) 4 MG tablet Take 1 tablet (4 mg total) by mouth every 8 (eight) hours as needed for nausea or vomiting. 40 tablet 3   oxybutynin  (DITROPAN ) 5 MG tablet Take 1 tablet (5 mg total) by mouth 2 (two) times daily. 180 tablet 0   rosuvastatin  (CRESTOR ) 5 MG tablet Take 5 mg by mouth daily. Takes 3 times a week     furosemide  (LASIX ) 20 MG tablet Take 1 tablet (20 mg total) by mouth every morning. 60 tablet 1   zanubrutinib  (BRUKINSA ) 80 MG capsule Take 3 capsules (240 mg total) by mouth daily. (Patient not taking: Reported on 07/08/2023) 90 capsule 0   No current facility-administered medications for this visit.   Facility-Administered Medications Ordered in Other Visits  Medication Dose Route Frequency Provider Last Rate Last Admin   heparin  lock flush 100 unit/mL  500 Units Intravenous Once Essynce Munsch R, MD       sodium chloride  flush (NS) 0.9 % injection 10 mL  10 mL Intravenous PRN Lalena Salas R, MD        PHYSICAL EXAMINATION: ECOG PERFORMANCE STATUS: 1 - Symptomatic but completely ambulatory  BP (!) 150/80 (BP Location: Right Arm, Patient Position: Sitting, Cuff Size: Normal)   Pulse 77   Temp 98 F (36.7 C) (Tympanic)   Resp 16   Ht 5' 5 (1.651 m)   Wt 142 lb (64.4 kg)   SpO2 99%   BMI 23.63 kg/m   Filed Weights   07/08/23 1454  Weight: 142 lb (64.4 kg)   Improvement/resolution of the neck and axillary adenopathy.  Bilateral leg swelling.  Clinically not suggestive of DVT.  Physical Exam HENT:     Head: Normocephalic and atraumatic.     Mouth/Throat:     Pharynx: No oropharyngeal exudate.   Eyes:     Pupils: Pupils are equal, round, and reactive to light.    Cardiovascular:     Rate and Rhythm: Normal rate and regular rhythm.  Pulmonary:     Effort: No respiratory distress.      Breath sounds:  No wheezing.  Abdominal:     General: Bowel sounds are normal. There is no distension.     Palpations: Abdomen is soft. There is no mass.     Tenderness: There is no abdominal tenderness. There is no guarding or rebound.   Musculoskeletal:        General: No tenderness. Normal range of motion.     Cervical back: Normal range of motion and neck supple.   Skin:    General: Skin is warm.   Neurological:     Mental Status: She is alert and oriented to person, place, and time.   Psychiatric:        Mood and Affect: Affect normal.     LABORATORY DATA:  I have reviewed the data as listed    Component Value Date/Time   NA 132 (L) 07/08/2023 1436   NA 139 07/03/2017 1425   NA 141 05/11/2014 0853   K 4.1 07/08/2023 1436   K 3.6 05/11/2014 0853   CL 101 07/08/2023 1436   CL 103 05/11/2014 0853   CO2 23 07/08/2023 1436   CO2 29 05/11/2014 0853   GLUCOSE 97 07/08/2023 1436   GLUCOSE 107 (H) 05/11/2014 0853   BUN 16 07/08/2023 1436   BUN 19 07/03/2017 1425   BUN 22 (H) 05/11/2014 0853   CREATININE 0.89 07/08/2023 1436   CREATININE 1.07 (H) 05/11/2014 0853   CALCIUM 9.0 07/08/2023 1436   CALCIUM 10.1 05/11/2014 0853   PROT 6.3 (L) 07/08/2023 1436   PROT 6.4 07/03/2017 1425   PROT 7.3 05/11/2014 0853   ALBUMIN 3.7 07/08/2023 1436   ALBUMIN 4.3 07/03/2017 1425   ALBUMIN 4.7 05/11/2014 0853   AST 30 07/08/2023 1436   ALT 20 07/08/2023 1436   ALT 20 05/11/2014 0853   ALKPHOS 85 07/08/2023 1436   ALKPHOS 47 05/11/2014 0853   BILITOT 0.9 07/08/2023 1436   GFRNONAA >60 07/08/2023 1436   GFRNONAA 51 (L) 05/11/2014 0853   GFRAA >60 08/05/2019 0941   GFRAA 59 (L) 05/11/2014 0853    No results found for: SPEP, UPEP  Lab Results  Component Value Date   WBC 3.9 (L) 07/08/2023   NEUTROABS 2.1 07/08/2023   HGB 9.8 (L) 07/08/2023   HCT 33.0 (L) 07/08/2023   MCV 73.5 (L) 07/08/2023   PLT 85 (L) 07/08/2023      Chemistry      Component Value Date/Time    NA 132 (L) 07/08/2023 1436   NA 139 07/03/2017 1425   NA 141 05/11/2014 0853   K 4.1 07/08/2023 1436   K 3.6 05/11/2014 0853   CL 101 07/08/2023 1436   CL 103 05/11/2014 0853   CO2 23 07/08/2023 1436   CO2 29 05/11/2014 0853   BUN 16 07/08/2023 1436   BUN 19 07/03/2017 1425   BUN 22 (H) 05/11/2014 0853   CREATININE 0.89 07/08/2023 1436   CREATININE 1.07 (H) 05/11/2014 0853   GLU 98 05/25/2013 0000      Component Value Date/Time   CALCIUM 9.0 07/08/2023 1436   CALCIUM 10.1 05/11/2014 0853   ALKPHOS 85 07/08/2023 1436   ALKPHOS 47 05/11/2014 0853   AST 30 07/08/2023 1436   ALT 20 07/08/2023 1436   ALT 20 05/11/2014 0853   BILITOT 0.9 07/08/2023 1436       RADIOGRAPHIC STUDIES: I have personally reviewed the radiological images as listed and agreed with the findings in the report. No results found.   ASSESSMENT & PLAN:  Small  cell B-cell lymphoma of lymph nodes of multiple sites (HCC) # Small B-cell lymphocytic lymphoma low-grade- stage IV-  AUG 2025- CT CAP- increasing lymphadenopathy in the chest, abdomen and pelvis, most evident in the chest, concerning  for recurrent disease.  Most recently on Brukinsa  [80 mg- 3 pills a day; since Mid sep 2024]-Clinical response noted with improvement of the neck and axillary adenopathy.  However noted to have increasing bruising/bleeding/fluid retention.  # Currently off Brukinsa  since April 2025.  Mild left axillary lymphadenopathy felt-but no obvious progression noted.  Will plan imaging again in few months./Next visit.  # Iron  deficiency anemia-unclear etiology [suspect chronic bleeding] s/p iron  infusion- improved- not resolved-hemoglobin today is 9.4.  Proceed with iron  infusion.  # CHF/chronic A . Fib-  Mylinda Asa Steiner]-on Eliquis 2.5 mg BID- stable.  Recommend reinstating Lasix  20 mg a day daily-called in a prescription.  Check BNP today; and again  next visit.  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   # DISPOSITION: # BNP today # venofer  today # venofer  every 2 weeks- x 2 more   # follow up in 3  months- MD; labs-cbc/BMP- BNP; iron  studies;ferritin-- Dr.B       Orders Placed This Encounter  Procedures   Brain natriuretic peptide    Standing Status:   Future    Number of Occurrences:   1    Expected Date:   07/08/2023    Expiration Date:   07/07/2024   CBC with Differential (Cancer Center Only)    Standing Status:   Future    Expected Date:   10/07/2023    Expiration Date:   01/05/2024   Basic Metabolic Panel - Cancer Center Only    Standing Status:   Future    Expected Date:   10/07/2023    Expiration Date:   01/05/2024   Iron  and TIBC    Standing Status:   Future    Expected Date:   10/07/2023    Expiration Date:   01/05/2024   Ferritin    Standing Status:   Future    Expected Date:   10/07/2023    Expiration Date:   01/05/2024   Brain natriuretic peptide    Standing Status:   Future    Expected Date:   10/07/2023    Expiration Date:   07/07/2024    All questions were answered. The patient knows to call the clinic with any problems, questions or concerns.      Gwyn Leos, MD 07/08/2023 4:15 PM

## 2023-07-08 NOTE — Patient Instructions (Signed)
#  Recommend gentle iron [iron biglycinate; 28 mg ] 1 pill a day.  This pill is unlikely to cause stomach upset or cause constipation.

## 2023-07-08 NOTE — Progress Notes (Signed)
 Patient is still having some swelling in her legs, the cardiologist prescribed her 6 pills of the lasix , and wants to know if Dr. Valentine Gasmen to be the one who prescribes it to her.    Would like to talk to the doctor about Brukinsa  medication.

## 2023-07-22 ENCOUNTER — Inpatient Hospital Stay

## 2023-07-22 VITALS — BP 124/71 | HR 59 | Temp 96.0°F | Resp 18

## 2023-07-22 DIAGNOSIS — Z7189 Other specified counseling: Secondary | ICD-10-CM

## 2023-07-22 DIAGNOSIS — D509 Iron deficiency anemia, unspecified: Secondary | ICD-10-CM | POA: Diagnosis not present

## 2023-07-22 DIAGNOSIS — C8308 Small cell B-cell lymphoma, lymph nodes of multiple sites: Secondary | ICD-10-CM

## 2023-07-22 MED ORDER — IRON SUCROSE 20 MG/ML IV SOLN
200.0000 mg | Freq: Once | INTRAVENOUS | Status: AC
  Administered 2023-07-22: 200 mg via INTRAVENOUS

## 2023-07-22 MED ORDER — SODIUM CHLORIDE 0.9% FLUSH
10.0000 mL | Freq: Once | INTRAVENOUS | Status: AC | PRN
  Administered 2023-07-22: 10 mL
  Filled 2023-07-22: qty 10

## 2023-08-05 ENCOUNTER — Inpatient Hospital Stay: Attending: Internal Medicine

## 2023-08-05 VITALS — BP 154/68 | HR 69 | Temp 96.9°F | Resp 18

## 2023-08-05 DIAGNOSIS — D509 Iron deficiency anemia, unspecified: Secondary | ICD-10-CM | POA: Insufficient documentation

## 2023-08-05 DIAGNOSIS — D696 Thrombocytopenia, unspecified: Secondary | ICD-10-CM | POA: Diagnosis present

## 2023-08-05 DIAGNOSIS — Z8572 Personal history of non-Hodgkin lymphomas: Secondary | ICD-10-CM | POA: Insufficient documentation

## 2023-08-05 DIAGNOSIS — C8308 Small cell B-cell lymphoma, lymph nodes of multiple sites: Secondary | ICD-10-CM

## 2023-08-05 DIAGNOSIS — Z7189 Other specified counseling: Secondary | ICD-10-CM

## 2023-08-05 MED ORDER — IRON SUCROSE 20 MG/ML IV SOLN
200.0000 mg | Freq: Once | INTRAVENOUS | Status: AC
  Administered 2023-08-05: 200 mg via INTRAVENOUS
  Filled 2023-08-05: qty 10

## 2023-09-08 ENCOUNTER — Other Ambulatory Visit: Payer: Self-pay | Admitting: Internal Medicine

## 2023-09-09 ENCOUNTER — Encounter: Payer: Self-pay | Admitting: Internal Medicine

## 2023-09-17 ENCOUNTER — Other Ambulatory Visit: Payer: Self-pay | Admitting: Pharmacy Technician

## 2023-09-17 NOTE — Progress Notes (Signed)
 Oral Oncology Patient Advocate Encounter  Disenrolling. Patient not taking: Reported on 07/08/2023   Genny Caulder (Patty) Chet Burnet, CPhT  [facility], [name], [place] Oral Chemotherapy Patient Advocate Specialist III Phone: [phone]  Fax: [phone]

## 2023-10-07 ENCOUNTER — Inpatient Hospital Stay: Attending: Internal Medicine

## 2023-10-07 ENCOUNTER — Encounter: Payer: Self-pay | Admitting: Internal Medicine

## 2023-10-07 ENCOUNTER — Inpatient Hospital Stay (HOSPITAL_BASED_OUTPATIENT_CLINIC_OR_DEPARTMENT_OTHER): Admitting: Internal Medicine

## 2023-10-07 VITALS — BP 148/76 | HR 56 | Temp 97.4°F | Resp 16 | Ht 65.0 in | Wt 141.4 lb

## 2023-10-07 DIAGNOSIS — Z803 Family history of malignant neoplasm of breast: Secondary | ICD-10-CM | POA: Diagnosis not present

## 2023-10-07 DIAGNOSIS — C8308 Small cell B-cell lymphoma, lymph nodes of multiple sites: Secondary | ICD-10-CM

## 2023-10-07 DIAGNOSIS — D696 Thrombocytopenia, unspecified: Secondary | ICD-10-CM | POA: Insufficient documentation

## 2023-10-07 DIAGNOSIS — I340 Nonrheumatic mitral (valve) insufficiency: Secondary | ICD-10-CM

## 2023-10-07 DIAGNOSIS — I509 Heart failure, unspecified: Secondary | ICD-10-CM

## 2023-10-07 DIAGNOSIS — Z8572 Personal history of non-Hodgkin lymphomas: Secondary | ICD-10-CM | POA: Diagnosis present

## 2023-10-07 DIAGNOSIS — I4891 Unspecified atrial fibrillation: Secondary | ICD-10-CM | POA: Diagnosis not present

## 2023-10-07 DIAGNOSIS — N182 Chronic kidney disease, stage 2 (mild): Secondary | ICD-10-CM | POA: Insufficient documentation

## 2023-10-07 DIAGNOSIS — I25810 Atherosclerosis of coronary artery bypass graft(s) without angina pectoris: Secondary | ICD-10-CM | POA: Diagnosis not present

## 2023-10-07 DIAGNOSIS — Z801 Family history of malignant neoplasm of trachea, bronchus and lung: Secondary | ICD-10-CM | POA: Diagnosis not present

## 2023-10-07 DIAGNOSIS — D509 Iron deficiency anemia, unspecified: Secondary | ICD-10-CM | POA: Insufficient documentation

## 2023-10-07 DIAGNOSIS — Z79899 Other long term (current) drug therapy: Secondary | ICD-10-CM | POA: Insufficient documentation

## 2023-10-07 DIAGNOSIS — R609 Edema, unspecified: Secondary | ICD-10-CM

## 2023-10-07 DIAGNOSIS — I4819 Other persistent atrial fibrillation: Secondary | ICD-10-CM

## 2023-10-07 DIAGNOSIS — Z7901 Long term (current) use of anticoagulants: Secondary | ICD-10-CM | POA: Diagnosis not present

## 2023-10-07 DIAGNOSIS — I130 Hypertensive heart and chronic kidney disease with heart failure and stage 1 through stage 4 chronic kidney disease, or unspecified chronic kidney disease: Secondary | ICD-10-CM | POA: Insufficient documentation

## 2023-10-07 DIAGNOSIS — Z9221 Personal history of antineoplastic chemotherapy: Secondary | ICD-10-CM | POA: Diagnosis not present

## 2023-10-07 DIAGNOSIS — I872 Venous insufficiency (chronic) (peripheral): Secondary | ICD-10-CM

## 2023-10-07 DIAGNOSIS — I2489 Other forms of acute ischemic heart disease: Secondary | ICD-10-CM

## 2023-10-07 LAB — CBC WITH DIFFERENTIAL (CANCER CENTER ONLY)
Abs Immature Granulocytes: 0.01 K/uL (ref 0.00–0.07)
Basophils Absolute: 0 K/uL (ref 0.0–0.1)
Basophils Relative: 1 %
Eosinophils Absolute: 0.1 K/uL (ref 0.0–0.5)
Eosinophils Relative: 2 %
HCT: 32.9 % — ABNORMAL LOW (ref 36.0–46.0)
Hemoglobin: 10.7 g/dL — ABNORMAL LOW (ref 12.0–15.0)
Immature Granulocytes: 0 %
Lymphocytes Relative: 40 %
Lymphs Abs: 1.8 K/uL (ref 0.7–4.0)
MCH: 27 pg (ref 26.0–34.0)
MCHC: 32.5 g/dL (ref 30.0–36.0)
MCV: 83.1 fL (ref 80.0–100.0)
Monocytes Absolute: 0.5 K/uL (ref 0.1–1.0)
Monocytes Relative: 11 %
Neutro Abs: 2.1 K/uL (ref 1.7–7.7)
Neutrophils Relative %: 46 %
Platelet Count: 93 K/uL — ABNORMAL LOW (ref 150–400)
RBC: 3.96 MIL/uL (ref 3.87–5.11)
RDW: 17.2 % — ABNORMAL HIGH (ref 11.5–15.5)
WBC Count: 4.5 K/uL (ref 4.0–10.5)
nRBC: 0 % (ref 0.0–0.2)

## 2023-10-07 LAB — BASIC METABOLIC PANEL - CANCER CENTER ONLY
Anion gap: 9 (ref 5–15)
BUN: 18 mg/dL (ref 8–23)
CO2: 29 mmol/L (ref 22–32)
Calcium: 9.3 mg/dL (ref 8.9–10.3)
Chloride: 95 mmol/L — ABNORMAL LOW (ref 98–111)
Creatinine: 0.89 mg/dL (ref 0.44–1.00)
GFR, Estimated: >60 mL/min (ref >60)
Glucose, Bld: 109 mg/dL — ABNORMAL HIGH (ref 70–99)
Potassium: 3.9 mmol/L (ref 3.5–5.1)
Sodium: 133 mmol/L — ABNORMAL LOW (ref 135–145)

## 2023-10-07 LAB — FERRITIN: Ferritin: 37 ng/mL (ref 11–307)

## 2023-10-07 LAB — IRON AND TIBC
Iron: 63 ug/dL (ref 28–170)
Saturation Ratios: 17 % (ref 10.4–31.8)
TIBC: 361 ug/dL (ref 250–450)
UIBC: 298 ug/dL

## 2023-10-07 LAB — BRAIN NATRIURETIC PEPTIDE: B Natriuretic Peptide: 120.6 pg/mL — ABNORMAL HIGH (ref 0.0–100.0)

## 2023-10-07 NOTE — Assessment & Plan Note (Addendum)
#   Small B-cell lymphocytic lymphoma low-grade- stage IV-  AUG 2025- CT CAP- increasing lymphadenopathy in the chest, abdomen and pelvis, most evident in the chest, concerning  for recurrent disease.  Most recently on Brukinsa  [80 mg- 3 pills a day; since Mid sep 2024]-Clinical response noted with improvement of the neck and axillary adenopathy.  However noted to have increasing bruising/bleeding/fluid retention.  # Currently off Brukinsa  since April 2025; however- re-started of her own x1 months- but recommend discontinuation given the stability diease/ and also intolerance. CT AUG 2025-  Mild left axillary lymphadenopathy felt-but no obvious progression noted.  Will plan imaging today.   # Iron  deficiency anemia-unclear etiology [suspect chronic bleeding] s/p iron  infusion- improved- not resolved-hemoglobin today is 10.7- continue to PO Iron . HOLD off   # CHF/chronic A . Fib-  Jacqueline Webb]-on Eliquis 2.5 mg BID- stable. Continue Lasix  20 mg a day daily per cardiology.   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   # DISPOSITION: # venofer  next week; and then weekly x 3 more-  # handicap placard  # follow up in 4  months- MD; labs-cbc/BMP- BNP; iron  studies;ferritin; possible venofer - CT CAP- -- Dr.B

## 2023-10-07 NOTE — Progress Notes (Signed)
 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 Lymphoma low-grade, status Rituxan  therapy. 2. Recurrent disease by clinical examination in December of 2012 3. Ovarian mass on PET scan in December of 2012 (normal CA 125). Ovarian mass has resolved after rituximab  therapy. 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, 2016) 6, patient started on bendamustine  and Rituxan  because of progressive disease and symptomatic disease (May 11, 2014) 7.  Patient had total 5 cycles of chemotherapy with bendamustine  and rituximab  and 6 cycle was omitted because of significant side effects with and weakness and fatigue.  # OCT 2016- PET significant response; ON surveillance  # 18th OCT 2018- Gazyva ;SEP 2018-/OCT 2018 CT scan- progression; s/p 6 cycles [#6 in April 2019]  # JAN 2020-CT scan shows progressive disease; start a acalabrutinib  100mg  BID;# feb 6th2020- decrease dose of acalbrutinib to 100 mg/day [sec to spon bruising] March 1st week- STOPPED Acala sec to spon brusing  # MARCH 17th 2020- Start Venetoclax  [out pt ramp up]; 300 mg a day; Discontiued  in AUg 2024. CT CAP-N: AUG 2024-progressive lymphadenopathy.   # Brukinsa - MID  SEp 2024- 3 pills a day - STOPPED Brukinsa   sec to spon brusing in April 2025.    # 11/06/2016- colonoscopy [Dr.Byrnett- 2 polyps]  #History of A. Fib-Xarelto; CKD stage II-III.   # #Right posterior neck melanoma stage I [ Dr.Lee/Elkins]..   -------------------------------------------------------------------    # left ? Adnexal mass- ? Etiology; mild SUV uptake incidental [stable compared to previous PET in 2016] Previous workup 2015- vaginal ultrasound negative. MRI February 2018- approximately 3 cm in size; slow increase in the size of the left adnexal mass over at least 3 years- suspect involvement of lymphoma rather than a primary gynecologic malignancy. --------------------------------------------------------------   DIAGNOSIS: SMALL LYMPHOCYTIC LEUKEMIA  STAGE:  IV    ;GOALS: control/pallaitive  CURRENT/MOST RECENT THERAPY: Venatoclax.    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  Jacqueline Webb 83 y.o.  female pleasant patient above history of SLL/CLL OFF Brukinsa - since April 2025 and also on eliquis for A-fib; CHF- Iron  deficiency aneima s/p venofer   is is here for follow-up.  Patient moved closer to her daughter.   Pt continues to have legs are swelling but this has been on and off.  Currently on lasix  a day per cardiology.     Pt states she doesn't have the energy or the stamina like she did and requesting a handicap placard.   No fever no chills.  Otherwise no lumps or bumps.  No abdominal pain nausea  vomiting.   No diarrhea.  No fever no chills.  No nausea no vomiting.  Review of Systems  Constitutional:  Positive for malaise/fatigue and weight loss. Negative for chills, diaphoresis and fever.   HENT:  Negative for nosebleeds and sore throat.   Eyes:  Negative for double vision.  Respiratory:  Negative for cough, hemoptysis, sputum production, shortness of breath and wheezing.   Cardiovascular:  Positive for leg swelling. Negative for chest pain, palpitations and orthopnea.  Gastrointestinal:  Negative for abdominal pain, blood in stool, constipation, diarrhea, heartburn, melena and vomiting.  Genitourinary:  Negative for dysuria, frequency and urgency.  Musculoskeletal:  Positive for back pain and joint pain.  Neurological:  Negative for dizziness, tingling, focal weakness, weakness and headaches.  Psychiatric/Behavioral:  Negative for depression. The patient is not nervous/anxious and does not have insomnia.     PAST MEDICAL HISTORY :  Past Medical History:  Diagnosis Date   A-fib [facility])    Benign neoplasm of rectum and anal canal    Chronic leukemia of unspecified cell type, without mention of having achieved remission    Dysrhythmia    A-Fib   Hypercholesteremia    Hypertension    Hyperthyroidism    Lymphoma (HCC) 2007   Non Hodgkin's lymphoma (HCC) 01/22/2006   Personal history of chemotherapy     PAST SURGICAL HISTORY :   Past Surgical History:  Procedure Laterality Date   ABDOMINAL HYSTERECTOMY  1958   APPENDECTOMY     BREAST BIOPSY Right 1970   right breast biopsy with clip- neg   BREAST BIOPSY Left 2002   left breast biopsy with clip-neg   BREAST BIOPSY Right 2014   right breast biopsy with clip-neg   BREAST CYST ASPIRATION Bilateral 2000   bilateral fine needle aspiration   BREAST SURGERY Left 1990   biopsy   BREAST SURGERY Right May 08, 2012   complex fibroadenoma without malignancy.   CARDIAC CATHETERIZATION  2014   COLONOSCOPY  2009, 2012    hyperplastic rectal polyp 2012 tubular adenoma of proximal ascending colon 2000 9 repeat exam due to 2017.   COLONOSCOPY WITH PROPOFOL  N/A 11/06/2016   Procedure: COLONOSCOPY WITH PROPOFOL ;  Surgeon:  Dessa Reyes ORN, MD;  Location: [facility] ENDOSCOPY;  Service: Endoscopy;  Laterality: N/A;   COLONOSCOPY WITH PROPOFOL  N/A 02/27/2019   Procedure: COLONOSCOPY WITH PROPOFOL ;  Surgeon: Dessa Reyes ORN, MD;  Location: [facility] ENDOSCOPY;  Service: General;  Laterality: N/A;   CORONARY ARTERY BYPASS GRAFT  1999   FLEXIBLE SIGMOIDOSCOPY  1998    FAMILY HISTORY :   Family History  Problem Relation Age of Onset   Heart disease Mother        Died age 17   Asthma Mother    Lung cancer Father        Died age 69   Heart attack Sister    Tuberculosis Sister        24 years old   Lung cancer Sister        Died in her 36s   Angina Sister    Breast cancer Sister 55   Rheum arthritis Sister    Aneurysm Sister        Brain, died age 51   Angina Sister    CAD Brother        double bypass   Heart disease Brother        Stent placement   Heart disease Brother        stent placement  Cancer Niece        breast cancer niece, pancreatic cancer niece   Breast cancer Niece    Ovarian cancer Neg Hx    Diabetes Neg Hx     SOCIAL HISTORY:   Social History   Tobacco Use   Smoking status: Never   Smokeless tobacco: Never  Vaping Use   Vaping status: Never Used  Substance Use Topics   Alcohol  use: Yes    Alcohol /week: 0.0 - 7.0 standard drinks of alcohol     Comment: 1 drink a day of wine or cocktail   Drug use: No    ALLERGIES:  is allergic to pravastatin.  MEDICATIONS:  Current Outpatient Medications  Medication Sig Dispense Refill   apixaban (ELIQUIS) 2.5 MG TABS tablet Take 2.5 mg by mouth 2 (two) times daily.     Docusate Calcium (STOOL SOFTENER PO) Take by mouth as needed.     furosemide  (LASIX ) 20 MG tablet TAKE 1 TABLET BY MOUTH EVERY  MORNING 90 tablet 3   isosorbide mononitrate (IMDUR) 30 MG 24 hr tablet Take 30 mg by mouth daily.     levothyroxine  (SYNTHROID ) 50 MCG tablet Take 50 mcg by mouth daily before breakfast.     lisinopril -hydrochlorothiazide  (ZESTORETIC ) 20-12.5 MG  tablet TAKE 1 TABLET BY MOUTH DAILY 30 tablet 0   melatonin 5 MG TABS Take 5 mg by mouth.     metoprolol succinate (TOPROL-XL) 25 MG 24 hr tablet Take 25 mg by mouth daily.      ondansetron  (ZOFRAN ) 4 MG tablet Take 1 tablet (4 mg total) by mouth every 8 (eight) hours as needed for nausea or vomiting. 40 tablet 3   oxybutynin  (DITROPAN ) 5 MG tablet Take 1 tablet (5 mg total) by mouth 2 (two) times daily. 180 tablet 0   rosuvastatin  (CRESTOR ) 5 MG tablet Take 5 mg by mouth daily. Takes 3 times a week     zanubrutinib  (BRUKINSA ) 80 MG capsule Take 3 capsules (240 mg total) by mouth daily. 90 capsule 0   No current facility-administered medications for this visit.   Facility-Administered Medications Ordered in Other Visits  Medication Dose Route Frequency Provider Last Rate Last Admin   heparin  lock flush 100 unit/mL  500 Units Intravenous Once Caily Rakers R, MD       sodium chloride  flush (NS) 0.9 % injection 10 mL  10 mL Intravenous PRN Shakeya Kerkman R, MD        PHYSICAL EXAMINATION: ECOG PERFORMANCE STATUS: 1 - Symptomatic but completely ambulatory  BP (!) 148/76 (BP Location: Left Arm, Patient Position: Sitting, Cuff Size: Normal) Comment: pt advised bp elevated, keep check at home, contact pcp if con't to stay elevated  Pulse (!) 56   Temp (!) 97.4 F (36.3 C) (Tympanic)   Resp 16   Ht 5' 5 (1.651 m)   Wt 141 lb 6.4 oz (64.1 kg)   SpO2 100%   BMI 23.53 kg/m   Filed Weights   10/07/23 1248  Weight: 141 lb 6.4 oz (64.1 kg)   Improvement/resolution of the neck and axillary adenopathy.  Bilateral leg swelling.  Clinically not suggestive of DVT.  Physical Exam HENT:     Head: Normocephalic and atraumatic.     Mouth/Throat:     Pharynx: No oropharyngeal exudate.  Eyes:     Pupils: Pupils are equal, round, and reactive to light.  Cardiovascular:     Rate and Rhythm: Normal rate and regular rhythm.  Pulmonary:     Effort:  No respiratory distress.     Breath  sounds: No wheezing.  Abdominal:     General: Bowel sounds are normal. There is no distension.     Palpations: Abdomen is soft. There is no mass.     Tenderness: There is no abdominal tenderness. There is no guarding or rebound.  Musculoskeletal:        General: No tenderness. Normal range of motion.     Cervical back: Normal range of motion and neck supple.  Skin:    General: Skin is warm.  Neurological:     Mental Status: She is alert and oriented to person, place, and time.  Psychiatric:        Mood and Affect: Affect normal.     LABORATORY DATA:  I have reviewed the data as listed    Component Value Date/Time   NA 132 (L) 07/08/2023 1436   NA 139 07/03/2017 1425   NA 141 05/11/2014 0853   K 4.1 07/08/2023 1436   K 3.6 05/11/2014 0853   CL 101 07/08/2023 1436   CL 103 05/11/2014 0853   CO2 23 07/08/2023 1436   CO2 29 05/11/2014 0853   GLUCOSE 97 07/08/2023 1436   GLUCOSE 107 (H) 05/11/2014 0853   BUN 16 07/08/2023 1436   BUN 19 07/03/2017 1425   BUN 22 (H) 05/11/2014 0853   CREATININE 0.89 07/08/2023 1436   CREATININE 1.07 (H) 05/11/2014 0853   CALCIUM 9.0 07/08/2023 1436   CALCIUM 10.1 05/11/2014 0853   PROT 6.3 (L) 07/08/2023 1436   PROT 6.4 07/03/2017 1425   PROT 7.3 05/11/2014 0853   ALBUMIN 3.7 07/08/2023 1436   ALBUMIN 4.3 07/03/2017 1425   ALBUMIN 4.7 05/11/2014 0853   AST 30 07/08/2023 1436   ALT 20 07/08/2023 1436   ALT 20 05/11/2014 0853   ALKPHOS 85 07/08/2023 1436   ALKPHOS 47 05/11/2014 0853   BILITOT 0.9 07/08/2023 1436   GFRNONAA >60 07/08/2023 1436   GFRNONAA 51 (L) 05/11/2014 0853   GFRAA >60 08/05/2019 0941   GFRAA 59 (L) 05/11/2014 0853    No results found for: SPEP, UPEP  Lab Results  Component Value Date   WBC 4.5 10/07/2023   NEUTROABS 2.1 10/07/2023   HGB 10.7 (L) 10/07/2023   HCT 32.9 (L) 10/07/2023   MCV 83.1 10/07/2023   PLT 93 (L) 10/07/2023      Chemistry      Component Value Date/Time   NA 132 (L) 07/08/2023  1436   NA 139 07/03/2017 1425   NA 141 05/11/2014 0853   K 4.1 07/08/2023 1436   K 3.6 05/11/2014 0853   CL 101 07/08/2023 1436   CL 103 05/11/2014 0853   CO2 23 07/08/2023 1436   CO2 29 05/11/2014 0853   BUN 16 07/08/2023 1436   BUN 19 07/03/2017 1425   BUN 22 (H) 05/11/2014 0853   CREATININE 0.89 07/08/2023 1436   CREATININE 1.07 (H) 05/11/2014 0853   GLU 98 05/25/2013 0000      Component Value Date/Time   CALCIUM 9.0 07/08/2023 1436   CALCIUM 10.1 05/11/2014 0853   ALKPHOS 85 07/08/2023 1436   ALKPHOS 47 05/11/2014 0853   AST 30 07/08/2023 1436   ALT 20 07/08/2023 1436   ALT 20 05/11/2014 0853   BILITOT 0.9 07/08/2023 1436       RADIOGRAPHIC STUDIES: I have personally reviewed the radiological images as listed and agreed with the findings in the report. No results found.   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  Most recently on Brukinsa  [80 mg- 3 pills a day; since Mid sep 2024]-Clinical response noted with improvement of the neck and axillary adenopathy.  However noted to have increasing bruising/bleeding/fluid retention.  # Currently off Brukinsa  since April 2025; however- re-started of her own x1 months- but recommend discontinuation given the stability diease/ and also intolerance. CT AUG 2025-  Mild left axillary lymphadenopathy felt-but no obvious progression noted.  Will plan imaging today.   # Iron  deficiency anemia-unclear etiology [suspect chronic bleeding] s/p iron  infusion- improved- not resolved-hemoglobin today is 10.7- continue to PO Iron . HOLD off   # CHF/chronic A . Fib-  Junette Steiner]-on Eliquis 2.5 mg BID- stable. Continue Lasix  20 mg a day daily per cardiology.   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   # DISPOSITION: # venofer  next week; and then weekly x 3 more-  # handicap placard  # follow up in 4  months- MD; labs-cbc/BMP- BNP; iron  studies;ferritin; possible venofer - CT CAP- -- Dr.B       Orders Placed This Encounter  Procedures   CT CHEST ABDOMEN PELVIS W CONTRAST    Standing Status:   Future    Expected Date:   02/06/2024    Expiration Date:   10/06/2024    If indicated for the ordered procedure, I authorize the administration of contrast media per Radiology protocol:   Yes    Does the patient have a contrast media/X-ray dye allergy?:   No    Preferred imaging location?:   [facility]    If indicated for the ordered procedure, I authorize the administration of oral contrast media per Radiology protocol:   Yes   CBC with Differential (Cancer Center Only)    Standing Status:   Future    Expected Date:   02/03/2024    Expiration Date:   05/03/2024   Basic Metabolic Panel - Cancer Center Only    Standing Status:   Future    Expected Date:   02/03/2024    Expiration Date:   05/03/2024   Brain natriuretic peptide    Standing Status:   Future    Expected Date:   02/03/2024    Expiration Date:   05/03/2024   Iron  and TIBC    Standing Status:   Future    Expected Date:   02/03/2024    Expiration Date:   05/03/2024   Ferritin    Standing Status:   Future    Expected Date:   02/03/2024    Expiration Date:   05/03/2024    All questions were answered. The patient knows to call the clinic with any problems, questions or concerns.      Cindy JONELLE Joe, MD 10/07/2023 2:09 PM

## 2023-10-07 NOTE — Progress Notes (Signed)
 Pt states legs are swelling but this has been on and off. Would like to discuss a grant for the bruknisa, she started taking it again.   Pt states she doesn't have the energy or the stamina like she did and requesting a handicap placard.

## 2023-10-09 ENCOUNTER — Encounter: Payer: Self-pay | Admitting: Internal Medicine

## 2023-10-14 ENCOUNTER — Inpatient Hospital Stay

## 2023-10-14 VITALS — BP 155/78 | HR 59 | Temp 98.0°F | Resp 16

## 2023-10-14 DIAGNOSIS — Z7189 Other specified counseling: Secondary | ICD-10-CM

## 2023-10-14 DIAGNOSIS — C8308 Small cell B-cell lymphoma, lymph nodes of multiple sites: Secondary | ICD-10-CM

## 2023-10-14 DIAGNOSIS — D509 Iron deficiency anemia, unspecified: Secondary | ICD-10-CM | POA: Diagnosis not present

## 2023-10-14 MED ORDER — IRON SUCROSE 20 MG/ML IV SOLN
200.0000 mg | Freq: Once | INTRAVENOUS | Status: AC
  Administered 2023-10-14: 200 mg via INTRAVENOUS
  Filled 2023-10-14: qty 10

## 2023-10-21 ENCOUNTER — Inpatient Hospital Stay

## 2023-10-21 VITALS — BP 137/67 | HR 67 | Temp 98.0°F | Resp 16

## 2023-10-21 DIAGNOSIS — D509 Iron deficiency anemia, unspecified: Secondary | ICD-10-CM | POA: Diagnosis not present

## 2023-10-21 DIAGNOSIS — Z7189 Other specified counseling: Secondary | ICD-10-CM

## 2023-10-21 DIAGNOSIS — C8308 Small cell B-cell lymphoma, lymph nodes of multiple sites: Secondary | ICD-10-CM

## 2023-10-21 MED ORDER — IRON SUCROSE 20 MG/ML IV SOLN
200.0000 mg | Freq: Once | INTRAVENOUS | Status: AC
  Administered 2023-10-21: 200 mg via INTRAVENOUS
  Filled 2023-10-21: qty 10

## 2023-10-21 NOTE — Patient Instructions (Signed)

## 2023-10-28 ENCOUNTER — Inpatient Hospital Stay: Attending: Internal Medicine

## 2023-10-28 VITALS — BP 117/60 | HR 54 | Temp 98.3°F | Resp 18

## 2023-10-28 DIAGNOSIS — D509 Iron deficiency anemia, unspecified: Secondary | ICD-10-CM | POA: Diagnosis present

## 2023-10-28 DIAGNOSIS — D696 Thrombocytopenia, unspecified: Secondary | ICD-10-CM | POA: Insufficient documentation

## 2023-10-28 DIAGNOSIS — C8308 Small cell B-cell lymphoma, lymph nodes of multiple sites: Secondary | ICD-10-CM

## 2023-10-28 DIAGNOSIS — Z8572 Personal history of non-Hodgkin lymphomas: Secondary | ICD-10-CM | POA: Diagnosis present

## 2023-10-28 DIAGNOSIS — Z7189 Other specified counseling: Secondary | ICD-10-CM

## 2023-10-28 MED ORDER — IRON SUCROSE 20 MG/ML IV SOLN
200.0000 mg | Freq: Once | INTRAVENOUS | Status: AC
  Administered 2023-10-28: 200 mg via INTRAVENOUS
  Filled 2023-10-28: qty 10

## 2023-10-28 NOTE — Patient Instructions (Signed)

## 2023-11-04 ENCOUNTER — Inpatient Hospital Stay

## 2023-11-04 VITALS — BP 146/85 | HR 67 | Temp 97.2°F | Resp 18

## 2023-11-04 DIAGNOSIS — C8308 Small cell B-cell lymphoma, lymph nodes of multiple sites: Secondary | ICD-10-CM

## 2023-11-04 DIAGNOSIS — Z7189 Other specified counseling: Secondary | ICD-10-CM

## 2023-11-04 DIAGNOSIS — D509 Iron deficiency anemia, unspecified: Secondary | ICD-10-CM | POA: Diagnosis not present

## 2023-11-04 MED ORDER — IRON SUCROSE 20 MG/ML IV SOLN
200.0000 mg | Freq: Once | INTRAVENOUS | Status: AC
  Administered 2023-11-04: 200 mg via INTRAVENOUS
  Filled 2023-11-04: qty 10

## 2023-11-04 NOTE — Patient Instructions (Signed)

## 2023-11-18 ENCOUNTER — Other Ambulatory Visit: Payer: Self-pay | Admitting: Family Medicine

## 2023-11-18 DIAGNOSIS — Z1231 Encounter for screening mammogram for malignant neoplasm of breast: Secondary | ICD-10-CM

## 2023-12-18 ENCOUNTER — Ambulatory Visit
Admission: RE | Admit: 2023-12-18 | Discharge: 2023-12-18 | Disposition: A | Discharge status: Home / Self Care | Source: Ambulatory Visit | Attending: Family Medicine | Admitting: Family Medicine

## 2023-12-18 DIAGNOSIS — Z1231 Encounter for screening mammogram for malignant neoplasm of breast: Secondary | ICD-10-CM | POA: Diagnosis present

## 2024-01-30 ENCOUNTER — Emergency Department

## 2024-01-30 ENCOUNTER — Telehealth: Payer: Self-pay | Admitting: Internal Medicine

## 2024-01-30 ENCOUNTER — Inpatient Hospital Stay
Admission: EM | Admit: 2024-01-30 | Discharge: 2024-02-02 | DRG: 872 | Disposition: A | Discharge status: Home / Self Care | Attending: Internal Medicine | Admitting: Internal Medicine

## 2024-01-30 ENCOUNTER — Encounter: Payer: Self-pay | Admitting: *Deleted

## 2024-01-30 ENCOUNTER — Other Ambulatory Visit: Payer: Self-pay

## 2024-01-30 DIAGNOSIS — E7800 Pure hypercholesterolemia, unspecified: Secondary | ICD-10-CM | POA: Diagnosis present

## 2024-01-30 DIAGNOSIS — A419 Sepsis, unspecified organism: Secondary | ICD-10-CM | POA: Diagnosis not present

## 2024-01-30 DIAGNOSIS — Z1152 Encounter for screening for COVID-19: Secondary | ICD-10-CM

## 2024-01-30 DIAGNOSIS — C8308 Small cell B-cell lymphoma, lymph nodes of multiple sites: Secondary | ICD-10-CM | POA: Diagnosis present

## 2024-01-30 DIAGNOSIS — N1831 Chronic kidney disease, stage 3a: Secondary | ICD-10-CM | POA: Diagnosis not present

## 2024-01-30 DIAGNOSIS — A4159 Other Gram-negative sepsis: Principal | ICD-10-CM | POA: Diagnosis present

## 2024-01-30 DIAGNOSIS — N390 Urinary tract infection, site not specified: Secondary | ICD-10-CM | POA: Diagnosis present

## 2024-01-30 DIAGNOSIS — I5032 Chronic diastolic (congestive) heart failure: Secondary | ICD-10-CM | POA: Diagnosis not present

## 2024-01-30 DIAGNOSIS — Z951 Presence of aortocoronary bypass graft: Secondary | ICD-10-CM

## 2024-01-30 DIAGNOSIS — I25810 Atherosclerosis of coronary artery bypass graft(s) without angina pectoris: Secondary | ICD-10-CM | POA: Diagnosis present

## 2024-01-30 DIAGNOSIS — Z79890 Hormone replacement therapy (postmenopausal): Secondary | ICD-10-CM

## 2024-01-30 DIAGNOSIS — Z8249 Family history of ischemic heart disease and other diseases of the circulatory system: Secondary | ICD-10-CM

## 2024-01-30 DIAGNOSIS — Z7901 Long term (current) use of anticoagulants: Secondary | ICD-10-CM

## 2024-01-30 DIAGNOSIS — I872 Venous insufficiency (chronic) (peripheral): Secondary | ICD-10-CM | POA: Diagnosis present

## 2024-01-30 DIAGNOSIS — Z831 Family history of other infectious and parasitic diseases: Secondary | ICD-10-CM

## 2024-01-30 DIAGNOSIS — N179 Acute kidney failure, unspecified: Secondary | ICD-10-CM | POA: Diagnosis present

## 2024-01-30 DIAGNOSIS — E871 Hypo-osmolality and hyponatremia: Secondary | ICD-10-CM | POA: Diagnosis present

## 2024-01-30 DIAGNOSIS — Z8261 Family history of arthritis: Secondary | ICD-10-CM

## 2024-01-30 DIAGNOSIS — E039 Hypothyroidism, unspecified: Secondary | ICD-10-CM | POA: Diagnosis not present

## 2024-01-30 DIAGNOSIS — I4819 Other persistent atrial fibrillation: Secondary | ICD-10-CM | POA: Diagnosis present

## 2024-01-30 DIAGNOSIS — D696 Thrombocytopenia, unspecified: Secondary | ICD-10-CM | POA: Diagnosis not present

## 2024-01-30 DIAGNOSIS — C434 Malignant melanoma of scalp and neck: Secondary | ICD-10-CM | POA: Diagnosis present

## 2024-01-30 DIAGNOSIS — Z825 Family history of asthma and other chronic lower respiratory diseases: Secondary | ICD-10-CM

## 2024-01-30 DIAGNOSIS — I130 Hypertensive heart and chronic kidney disease with heart failure and stage 1 through stage 4 chronic kidney disease, or unspecified chronic kidney disease: Secondary | ICD-10-CM | POA: Diagnosis present

## 2024-01-30 DIAGNOSIS — Z860100 Personal history of colon polyps, unspecified: Secondary | ICD-10-CM

## 2024-01-30 DIAGNOSIS — Z9221 Personal history of antineoplastic chemotherapy: Secondary | ICD-10-CM

## 2024-01-30 DIAGNOSIS — I10 Essential (primary) hypertension: Secondary | ICD-10-CM | POA: Diagnosis not present

## 2024-01-30 DIAGNOSIS — E876 Hypokalemia: Secondary | ICD-10-CM | POA: Diagnosis present

## 2024-01-30 DIAGNOSIS — I2510 Atherosclerotic heart disease of native coronary artery without angina pectoris: Secondary | ICD-10-CM | POA: Diagnosis present

## 2024-01-30 DIAGNOSIS — Z79899 Other long term (current) drug therapy: Secondary | ICD-10-CM

## 2024-01-30 DIAGNOSIS — Z801 Family history of malignant neoplasm of trachea, bronchus and lung: Secondary | ICD-10-CM

## 2024-01-30 DIAGNOSIS — R6520 Severe sepsis without septic shock: Secondary | ICD-10-CM | POA: Diagnosis present

## 2024-01-30 DIAGNOSIS — R509 Fever, unspecified: Principal | ICD-10-CM

## 2024-01-30 DIAGNOSIS — Z803 Family history of malignant neoplasm of breast: Secondary | ICD-10-CM

## 2024-01-30 LAB — COMPREHENSIVE METABOLIC PANEL WITH GFR
ALT: 15 U/L (ref 0–44)
AST: 21 U/L (ref 15–41)
Albumin: 3.7 g/dL (ref 3.5–5.0)
Alkaline Phosphatase: 112 U/L (ref 38–126)
Anion gap: 12 (ref 5–15)
BUN: 28 mg/dL — ABNORMAL HIGH (ref 8–23)
CO2: 23 mmol/L (ref 22–32)
Calcium: 9 mg/dL (ref 8.9–10.3)
Chloride: 95 mmol/L — ABNORMAL LOW (ref 98–111)
Creatinine, Ser: 1.39 mg/dL — ABNORMAL HIGH (ref 0.44–1.00)
GFR, Estimated: 37 mL/min — ABNORMAL LOW
Glucose, Bld: 120 mg/dL — ABNORMAL HIGH (ref 70–99)
Potassium: 3.6 mmol/L (ref 3.5–5.1)
Sodium: 130 mmol/L — ABNORMAL LOW (ref 135–145)
Total Bilirubin: 0.8 mg/dL (ref 0.0–1.2)
Total Protein: 5.9 g/dL — ABNORMAL LOW (ref 6.5–8.1)

## 2024-01-30 LAB — PROTIME-INR
INR: 1.9 — ABNORMAL HIGH (ref 0.8–1.2)
Prothrombin Time: 23.1 s — ABNORMAL HIGH (ref 11.4–15.2)

## 2024-01-30 LAB — CBC WITH DIFFERENTIAL/PLATELET
Abs Immature Granulocytes: 0.08 K/uL — ABNORMAL HIGH (ref 0.00–0.07)
Basophils Absolute: 0.1 K/uL (ref 0.0–0.1)
Basophils Relative: 0 %
Eosinophils Absolute: 0 K/uL (ref 0.0–0.5)
Eosinophils Relative: 0 %
HCT: 30.1 % — ABNORMAL LOW (ref 36.0–46.0)
Hemoglobin: 10 g/dL — ABNORMAL LOW (ref 12.0–15.0)
Immature Granulocytes: 1 %
Lymphocytes Relative: 6 %
Lymphs Abs: 0.8 K/uL (ref 0.7–4.0)
MCH: 29.2 pg (ref 26.0–34.0)
MCHC: 33.2 g/dL (ref 30.0–36.0)
MCV: 88 fL (ref 80.0–100.0)
Monocytes Absolute: 1.2 K/uL — ABNORMAL HIGH (ref 0.1–1.0)
Monocytes Relative: 10 %
Neutro Abs: 10.3 K/uL — ABNORMAL HIGH (ref 1.7–7.7)
Neutrophils Relative %: 83 %
Platelets: 86 K/uL — ABNORMAL LOW (ref 150–400)
RBC: 3.42 MIL/uL — ABNORMAL LOW (ref 3.87–5.11)
RDW: 14.7 % (ref 11.5–15.5)
Smear Review: NORMAL
WBC: 12.4 K/uL — ABNORMAL HIGH (ref 4.0–10.5)
nRBC: 0 % (ref 0.0–0.2)

## 2024-01-30 LAB — RESP PANEL BY RT-PCR (RSV, FLU A&B, COVID)  RVPGX2
Influenza A by PCR: NEGATIVE
Influenza B by PCR: NEGATIVE
Resp Syncytial Virus by PCR: NEGATIVE
SARS Coronavirus 2 by RT PCR: NEGATIVE

## 2024-01-30 LAB — URINALYSIS, W/ REFLEX TO CULTURE (INFECTION SUSPECTED)
Bilirubin Urine: NEGATIVE
Glucose, UA: NEGATIVE mg/dL
Ketones, ur: NEGATIVE mg/dL
Nitrite: NEGATIVE
Protein, ur: 100 mg/dL — AB
Specific Gravity, Urine: 1.016 (ref 1.005–1.030)
WBC, UA: >50 WBC/hpf (ref 0–5)
pH: 5 (ref 5.0–8.0)

## 2024-01-30 LAB — LACTIC ACID, PLASMA: Lactic Acid, Venous: 0.7 mmol/L (ref 0.5–1.9)

## 2024-01-30 MED ORDER — SODIUM CHLORIDE 0.9 % IV SOLN
1.0000 g | Freq: Once | INTRAVENOUS | Status: AC
  Administered 2024-01-31: 1 g via INTRAVENOUS
  Filled 2024-01-30: qty 10

## 2024-01-30 MED ORDER — SODIUM CHLORIDE 0.9 % IV BOLUS
1000.0000 mL | Freq: Once | INTRAVENOUS | Status: AC
  Administered 2024-01-31: 1000 mL via INTRAVENOUS

## 2024-01-30 MED ORDER — ONDANSETRON HCL 4 MG/2ML IJ SOLN
4.0000 mg | Freq: Once | INTRAMUSCULAR | Status: AC
  Administered 2024-01-30: 4 mg via INTRAVENOUS
  Filled 2024-01-30: qty 2

## 2024-01-30 MED ORDER — ACETAMINOPHEN 500 MG PO TABS
1000.0000 mg | ORAL_TABLET | Freq: Once | ORAL | Status: AC
  Administered 2024-01-30: 1000 mg via ORAL
  Filled 2024-01-30: qty 2

## 2024-01-30 MED ORDER — SODIUM CHLORIDE 0.9 % IV BOLUS
1000.0000 mL | Freq: Once | INTRAVENOUS | Status: AC
  Administered 2024-01-30: 1000 mL via INTRAVENOUS

## 2024-01-30 NOTE — ED Provider Notes (Signed)
 Evidence of urinary tract infection on urinalysis. Viral swabs still pending. Initial vital signs were concerning given fever, tachycardia, increased respiratory rate.  They have improved. Admit to medicine service.   Cyrena Mylar, MD 01/30/24 [phone]

## 2024-01-30 NOTE — H&P (Addendum)
 " History and Physical    Jacqueline Webb:[id number] DOB: 27-Sep-1940 DOA: 01/30/2024  Referring MD/NP/PA:   PCP: Rennie Cindy SAUNDERS, MD   Patient coming from:  The patient is coming from home.     Chief Complaint: Fever, chills, weakness, increased urinary frequency  HPI: Jacqueline Webb is a 84 y.o. 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  Per patient and her daughter at the bedside, her symptoms started today, including fever, chills, generalized weakness, increased urinary frequency.  No dysuria or hematuria.  Denies flank pain.  No nausea, vomiting, diarrhea or abdominal pain.  She also has mild SOB, no cough or chest pain.  Data reviewed independently and ED Course: pt was found to have positive UA (cloudy appearance, moderate leukocyte esterase, rare bacteria, WBC > 50), lactic acid 0.7, INR 1.9, worsening renal function, negative PCR for COVID, flu and RSV, pending RVP, proBNP 3065.  Temperature 103.1, softer blood pressure with SBP upper 90s-100s, heart rate 122, RR 22, oxygen sat 96% on room air.  Chest x-ray negative for infiltration.  Patient is admitted to PCU as inpatient.   EKG: I have personally reviewed.  Afebrile, QTc 439, nonspecific T wave change.   Review of Systems:   General: has fevers, chills, no body weight gain, has poor appetite, has fatigue HEENT: no blurry vision, no sore throat Respiratory: has mild dyspnea, no coughing, wheezing CV: no chest pain, no palpitations GI: no nausea, vomiting, abdominal pain, diarrhea, constipation GU: no dysuria, burning on urination, has increased urinary frequency, no hematuria  Ext: has leg edema Neuro: no unilateral weakness, numbness, or tingling,  no vision change or hearing loss Skin: no rash, no skin tear. MSK: No muscle spasm, no deformity, no limitation of range of movement in spin Heme: No easy bruising.  Travel history: No recent long distant travel.   Allergy: Allergies[1]  Past Medical History:  Diagnosis Date   A-fib (HCC)    Benign neoplasm of rectum and anal canal    Chronic leukemia of unspecified cell type, without mention of having achieved remission    Dysrhythmia    A-Fib   Hypercholesteremia    Hypertension    Hyperthyroidism    Lymphoma (HCC) 2007   Non Hodgkin's lymphoma (HCC) 01/22/2006   Personal history of chemotherapy     Past Surgical History:  Procedure Laterality Date   ABDOMINAL HYSTERECTOMY  1958   APPENDECTOMY     BREAST BIOPSY Right 1970   right breast biopsy with clip- neg   BREAST BIOPSY Left 2002   left breast biopsy with clip-neg   BREAST BIOPSY Right 2014   right breast biopsy with clip-neg   BREAST CYST ASPIRATION Bilateral 2000   bilateral fine needle aspiration   BREAST SURGERY Left 1990   biopsy   BREAST SURGERY Right May 08, 2012   complex fibroadenoma without malignancy.   CARDIAC CATHETERIZATION  2014   COLONOSCOPY  2009, 2012    hyperplastic rectal polyp 2012 tubular adenoma of proximal ascending colon 2000 9 repeat exam due to 2017.   COLONOSCOPY WITH PROPOFOL  N/A 11/06/2016   Procedure: COLONOSCOPY WITH PROPOFOL ;  Surgeon: Dessa Reyes ORN, MD;  Location: [facility] ENDOSCOPY;  Service: Endoscopy;  Laterality: N/A;   COLONOSCOPY WITH PROPOFOL  N/A 02/27/2019  Procedure: COLONOSCOPY WITH PROPOFOL ;  Surgeon: Dessa Reyes ORN, MD;  Location: [facility] ENDOSCOPY;  Service: General;  Laterality: N/A;   CORONARY ARTERY BYPASS GRAFT  1999   FLEXIBLE SIGMOIDOSCOPY  1998    Social History:  reports that she has never smoked. She has never used smokeless tobacco. She reports current alcohol  use. She reports that she does not use drugs.  Family History:  Family History  Problem  Relation Age of Onset   Heart disease Mother        Died age 49   Asthma Mother    Lung cancer Father        Died age 57   Heart attack Sister    Tuberculosis Sister        24 years old   Lung cancer Sister        Died in her 85s   Angina Sister    Breast cancer Sister 1   Rheum arthritis Sister    Aneurysm Sister        Brain, died age 46   Angina Sister    CAD Brother        double bypass   Heart disease Brother        Stent placement   Heart disease Brother        stent placement   Cancer Niece        breast cancer niece, pancreatic cancer niece   Breast cancer Niece    Ovarian cancer Neg Hx    Diabetes Neg Hx      Prior to Admission medications  Medication Sig Start Date End Date Taking? Authorizing Provider  apixaban  (ELIQUIS ) 2.5 MG TABS tablet Take 2.5 mg by mouth 2 (two) times daily. 05/08/23   [provider]  Docusate Calcium  (STOOL SOFTENER PO) Take by mouth as needed.    [provider]  furosemide  (LASIX ) 20 MG tablet TAKE 1 TABLET BY MOUTH EVERY  MORNING 09/09/23   Brahmanday, Govinda R, MD  isosorbide mononitrate (IMDUR) 30 MG 24 hr tablet Take 30 mg by mouth daily. 08/26/18   [provider]  levothyroxine  (SYNTHROID ) 50 MCG tablet Take 50 mcg by mouth daily before breakfast.    [provider]  lisinopril -hydrochlorothiazide  (ZESTORETIC ) 20-12.5 MG tablet TAKE 1 TABLET BY MOUTH DAILY 05/29/22   Simmons-Robinson, Makiera, MD  melatonin 5 MG TABS Take 5 mg by mouth.    [provider]  metoprolol  succinate (TOPROL -XL) 25 MG 24 hr tablet Take 25 mg by mouth daily.  08/10/15   [provider]  ondansetron  (ZOFRAN ) 4 MG tablet Take 1 tablet (4 mg total) by mouth every 8 (eight) hours as needed for nausea or vomiting. 11/09/22   Borders, Fonda SAUNDERS, NP  oxybutynin  (DITROPAN ) 5 MG tablet Take 1 tablet (5 mg total) by mouth 2 (two) times daily. 08/16/22   Emilio Kelly DASEN, FNP  rosuvastatin  (CRESTOR ) 5 MG tablet Take 5 mg  by mouth daily. Takes 3 times a week 02/11/19   [provider]  zanubrutinib  (BRUKINSA ) 80 MG capsule Take 3 capsules (240 mg total) by mouth daily. 04/15/23   Rennie Cindy SAUNDERS, MD    Physical Exam: Vitals:   01/30/24 2213 01/30/24 2313 01/31/24 0009 01/31/24 0100  BP:  (!) 116/90    Pulse:  83    Resp:  16    Temp:   98.4 F (36.9 C) 97.8 F (36.6 C)  TempSrc:   Oral Oral  SpO2: 96% 96%  Weight:      Height:       General: Not in acute distress HEENT:       Eyes: PERRL, EOMI, no jaundice       ENT: No discharge from the ears and nose, no pharynx injection, no tonsillar enlargement.        Neck: No JVD, no bruit, no mass felt. Heme: No neck lymph node enlargement. Cardiac: S1/S2, irregularly irregular rhythm, no gallops or rubs. Respiratory: No rales, wheezing, rhonchi or rubs. GI: Soft, nondistended, nontender, no rebound pain, no organomegaly, BS present. GU: No hematuria Ext: has chronic venous insufficiency and leg edema bilaterally. 1+DP/PT pulse bilaterally. Musculoskeletal: No joint deformities, No joint redness or warmth, no limitation of ROM in spin. Skin: No rashes.  Neuro: Alert, oriented X3, cranial nerves II-XII grossly intact, moves all extremities. Psych: Patient is not psychotic, no suicidal or hemocidal ideation.  Labs on Admission: I have personally reviewed following labs and imaging studies  CBC: Recent Labs  Lab 01/30/24 2226  WBC 12.4*  NEUTROABS 10.3*  HGB 10.0*  HCT 30.1*  MCV 88.0  PLT 86*   Basic Metabolic Panel: Recent Labs  Lab 01/30/24 2226  NA 130*  K 3.6  CL 95*  CO2 23  GLUCOSE 120*  BUN 28*  CREATININE 1.39*  CALCIUM  9.0   GFR: Estimated Creatinine Clearance: 26.6 mL/min (A) (by C-G formula based on SCr of 1.39 mg/dL (H)). Liver Function Tests: Recent Labs  Lab 01/30/24 2226  AST 21  ALT 15  ALKPHOS 112  BILITOT 0.8  PROT 5.9*  ALBUMIN 3.7   No results for input(s): LIPASE, AMYLASE in the last  168 hours. No results for input(s): AMMONIA in the last 168 hours. Coagulation Profile: Recent Labs  Lab 01/30/24 2226  INR 1.9*   Cardiac Enzymes: No results for input(s): CKTOTAL, CKMB, CKMBINDEX, TROPONINI in the last 168 hours. BNP (last 3 results) Recent Labs    01/30/24 2226  PROBNP 3,065.0*   HbA1C: No results for input(s): HGBA1C in the last 72 hours. CBG: No results for input(s): GLUCAP in the last 168 hours. Lipid Profile: No results for input(s): CHOL, HDL, LDLCALC, TRIG, CHOLHDL, LDLDIRECT in the last 72 hours. Thyroid  Function Tests: No results for input(s): TSH, T4TOTAL, FREET4, T3FREE, THYROIDAB in the last 72 hours. Anemia Panel: No results for input(s): VITAMINB12, FOLATE, FERRITIN, TIBC, IRON , RETICCTPCT in the last 72 hours. Urine analysis:    Component Value Date/Time   COLORURINE YELLOW (A) 01/30/2024 2226   APPEARANCEUR CLOUDY (A) 01/30/2024 2226   APPEARANCEUR Cloudy (A) 10/03/2016 1519   LABSPEC 1.016 01/30/2024 2226   PHURINE 5.0 01/30/2024 2226   GLUCOSEU NEGATIVE 01/30/2024 2226   HGBUR SMALL (A) 01/30/2024 2226   BILIRUBINUR NEGATIVE 01/30/2024 2226   BILIRUBINUR Negative 10/03/2016 1519   KETONESUR NEGATIVE 01/30/2024 2226   PROTEINUR 100 (A) 01/30/2024 2226   UROBILINOGEN 0.2 08/18/2015 1103   NITRITE NEGATIVE 01/30/2024 2226   LEUKOCYTESUR MODERATE (A) 01/30/2024 2226   Sepsis Labs: @LABRCNTIP (procalcitonin:4,lacticidven:4) ) Recent Results (from the past 240 hours)  Resp panel by RT-PCR (RSV, Flu A&B, Covid) Anterior Nasal Swab     Status: None   Collection Time: 01/30/24 10:26 PM   Specimen: Anterior Nasal Swab  Result Value Ref Range Status   SARS Coronavirus 2 by RT PCR NEGATIVE NEGATIVE Final    Comment: (NOTE) SARS-CoV-2 target nucleic acids are NOT DETECTED.  The SARS-CoV-2 RNA is generally detectable in upper respiratory specimens during the acute  phase of infection. The  lowest concentration of SARS-CoV-2 viral copies this assay can detect is 138 copies/mL. A negative result does not preclude SARS-Cov-2 infection and should not be used as the sole basis for treatment or other patient management decisions. A negative result may occur with  improper specimen collection/handling, submission of specimen other than nasopharyngeal swab, presence of viral mutation(s) within the areas targeted by this assay, and inadequate number of viral copies(<138 copies/mL). A negative result must be combined with clinical observations, patient history, and epidemiological information. The expected result is Negative.  Fact Sheet for Patients:  bloggercourse.com  Fact Sheet for Healthcare Providers:  seriousbroker.it  This test is no t yet approved or cleared by the [place]  FDA and  has been authorized for detection and/or diagnosis of SARS-CoV-2 by FDA under an Emergency Use Authorization (EUA). This EUA will remain  in effect (meaning this test can be used) for the duration of the COVID-19 declaration under Section 564(b)(1) of the Act, 21 U.S.C.section 360bbb-3(b)(1), unless the authorization is terminated  or revoked sooner.       Influenza A by PCR NEGATIVE NEGATIVE Final   Influenza B by PCR NEGATIVE NEGATIVE Final    Comment: (NOTE) The Xpert Xpress SARS-CoV-2/FLU/RSV plus assay is intended as an aid in the diagnosis of influenza from Nasopharyngeal swab specimens and should not be used as a sole basis for treatment. Nasal washings and aspirates are unacceptable for Xpert Xpress SARS-CoV-2/FLU/RSV testing.  Fact Sheet for Patients: bloggercourse.com  Fact Sheet for Healthcare Providers: seriousbroker.it  This test is not yet approved or cleared by the [place]  FDA and has been authorized for detection and/or diagnosis of SARS-CoV-2 by FDA under  an Emergency Use Authorization (EUA). This EUA will remain in effect (meaning this test can be used) for the duration of the COVID-19 declaration under Section 564(b)(1) of the Act, 21 U.S.C. section 360bbb-3(b)(1), unless the authorization is terminated or revoked.     Resp Syncytial Virus by PCR NEGATIVE NEGATIVE Final    Comment: (NOTE) Fact Sheet for Patients: bloggercourse.com  Fact Sheet for Healthcare Providers: seriousbroker.it  This test is not yet approved or cleared by the [place]  FDA and has been authorized for detection and/or diagnosis of SARS-CoV-2 by FDA under an Emergency Use Authorization (EUA). This EUA will remain in effect (meaning this test can be used) for the duration of the COVID-19 declaration under Section 564(b)(1) of the Act, 21 U.S.C. section 360bbb-3(b)(1), unless the authorization is terminated or revoked.  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): Patient meets criteria for sepsis with WBC 12.4, temperature 103.1, heart rate up to 122, RR 22.  Lactic acid is normal at 0.7.  Blood pressure is softer.  Patient is at high risk of developing hypotension.  Patient does not have flank pain, no nausea vomiting or abdominal pain.  Low suspicions for kidney stone  -  Admitted to PCU as inpatient - IV  Rocephin  - Follow-up blood culture and urine culture - IV fluid: 2 L normal saline  Chronic diastolic CHF (congestive heart failure) (HCC): 2D echo on 05/08/2023 showed EF> 55%.  Patient has chronic venous insufficiency change and leg edema bilaterally.  BNP is elevated at 3065, but no oxygen desaturation.  No acute CHF exacerbation. - Will hold Lasix  due to sepsis and high risk of developing hypotension  Essential (primary) hypertension -IV hydralazine  as needed - Hold all blood pressure medications due to risk of hypotension, including Lasix , Imdur, Zestoretic , metoprolol   Hypercholesteremia -Crestor   CAD (coronary artery disease) of bypass graft: No chest pain -Hold Imdure - Continue Crestor  - Patient is on Eliquis   Adult hypothyroidism -Synthroid   Persistent atrial fibrillation (HCC): Heart rate 122 --> 83 -Hold metoprolol  due to risk of developing hypotension - Continue Eliquis  -prn IV metoprolol  2.5 mg every 2 hour for heart rate> 125  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  - Follow-up with Dr. Rennie  Chronic kidney disease, stage 3a [facility]): Renal function is worsening.  Recent baseline creatinine 0.89 on 10/07/2023.  Her creatinine is 1.39, BUN 28, GFR 37.  Likely due to UTI. - IV fluid as above - Lasix  and Zestoretic  are on hold  Thrombocytopenia: This is chronic issue.  Platelet 86. - Follow-up with CBC      DVT ppx: on Eliquis   Code Status: Full code per pt  Family Communication:      Yes, patient's daughter at bed side.     Disposition Plan:  Anticipate discharge back to previous environment  Consults called: None  Admission status and Level of care: Progressive:   as inpt        Dispo: The patient is from: Home              Anticipated d/c is to: Home              Anticipated d/c date is: 2 days              Patient currently is not medically stable to d/c.    Severity of  Illness:  The appropriate patient status for this patient is INPATIENT. Inpatient status is judged to be reasonable and necessary in order to provide the required intensity of service to ensure the patient's safety. The patient's presenting symptoms, physical exam findings, and initial radiographic and laboratory data in the context of their chronic comorbidities is felt to place them at high risk for further clinical deterioration. Furthermore, it is not anticipated that the patient will be medically stable for discharge from the hospital within 2 midnights of admission.   * I certify that at the point of admission it is my clinical judgment that the patient will require inpatient hospital care spanning beyond 2 midnights from the point of admission due to high intensity of service, high risk for further deterioration and high frequency of surveillance required.*       Date of Service 01/31/2024    Caleb Exon [facility]   If 7PM-7AM, please contact night-coverage [email or web address] 01/31/2024, 1:36 AM     [1]  Allergies Allergen Reactions   Pravastatin Other (See Comments)   "

## 2024-01-30 NOTE — ED Notes (Signed)
 Pt to room 13 from triage.

## 2024-01-30 NOTE — ED Provider Notes (Signed)
 "  [facility] Provider Note    Event Date/Time   First MD Initiated Contact with Patient 01/30/24 2156     (approximate)  History   Chief Complaint: Fever  HPI  Jacqueline Webb is a 84 y.o. female with a past medical history of atrial fibrillation, hypertension, hyperlipidemia, anemia, presents to the emergency department for generalized weakness and chills.  According to the patient since yesterday she has been shaking having chills and feeling somewhat short of breath.  Patient denies any cough or congestion.  Denies any abdominal pain or chest pain.  Denies any urinary symptoms.  Patient found to be febrile to 103.1.  Physical Exam   Triage Vital Signs: ED Triage Vitals  Encounter Vitals Group     BP 01/30/24 2139 (!) 126/90     Girls Systolic BP Percentile --      Girls Diastolic BP Percentile --      Boys Systolic BP Percentile --      Boys Diastolic BP Percentile --      Pulse Rate 01/30/24 2139 (!) 122     Resp 01/30/24 2139 (!) 22     Temp 01/30/24 2139 (!) 103.1 F (39.5 C)     Temp Source 01/30/24 2139 Oral     SpO2 01/30/24 2139 97 %     Weight 01/30/24 2140 145 lb (65.8 kg)     Height 01/30/24 2140 5' 5 (1.651 m)     Head Circumference --      Peak Flow --      Pain Score 01/30/24 2140 8     Pain Loc --      Pain Education --      Exclude from Growth Chart --     Most recent vital signs: Vitals:   01/30/24 2139  BP: (!) 126/90  Pulse: (!) 122  Resp: (!) 22  Temp: (!) 103.1 F (39.5 C)  SpO2: 97%    General: Awake, no distress.  CV:  Good peripheral perfusion.  Regular rate and rhythm  Resp:  Normal effort.  Equal breath sounds bilaterally.  Abd:  No distention.  Soft, nontender.   ED Results / Procedures / Treatments   EKG  EKG viewed and interpreted by myself shows atrial fibrillation at 112 bpm with a narrow QRS, normal axis, normal intervals, nonspecific ST changes without ST elevation.  RADIOLOGY  Chest x-ray  shows cardiomegaly with small pleural effusions.   MEDICATIONS ORDERED IN ED: Medications  sodium chloride  0.9 % bolus 1,000 mL (has no administration in time range)  ondansetron  (ZOFRAN ) injection 4 mg (has no administration in time range)  acetaminophen  (TYLENOL ) tablet 1,000 mg (1,000 mg Oral Given 01/30/24 2145)     IMPRESSION / MDM / ASSESSMENT AND PLAN / ED COURSE  I reviewed the triage vital signs and the nursing notes.  Patient's presentation is most consistent with acute presentation with potential threat to life or bodily function.  Patient presents to the emergency department for generalized weakness chills found to be febrile to 103.1.  Patient states she has been somewhat short of breath since yesterday 2 but denies any cough congestion denies any chest pain abdominal pain denies any vomiting diarrhea or urinary symptoms.  Symptoms seem suggestive of an infectious etiology, given the shortness of breath possible indicating more viral etiology such as influenza.  Will dose Tylenol , IV fluids and Zofran .  Will check labs including blood cultures lactic acid will obtain a COVID/flu swab.  Will  obtain a chest x-ray to rule out pneumonia and urinalysis to evaluate for UTI.  Patient agreeable to plan of care and workup.  Patient's workup is pending.  Patient care signed out to oncoming provider.  If flu negative patient will require antibiotics.  FINAL CLINICAL IMPRESSION(S) / ED DIAGNOSES   Fever Weakness    Note:  This document was prepared using Dragon voice recognition software and may include unintentional dictation errors.   Dorothyann Drivers, MD 01/30/24 2317  "

## 2024-01-30 NOTE — Telephone Encounter (Signed)
 Called pt to confirm CT for 1/12 - wanted to remind of arrival 2 hours prior - no answer and no vm set up - will try to call again - [facility]

## 2024-01-30 NOTE — ED Triage Notes (Addendum)
 Pt ambulatory to triage.  Pt has fever, bodayaches and body tremors/chills.   Sx began today.   No cough.  Pt has sob  no chest pain.  Pt alert. Speech clear.

## 2024-01-31 DIAGNOSIS — Z79890 Hormone replacement therapy (postmenopausal): Secondary | ICD-10-CM | POA: Diagnosis not present

## 2024-01-31 DIAGNOSIS — A4159 Other Gram-negative sepsis: Secondary | ICD-10-CM | POA: Diagnosis present

## 2024-01-31 DIAGNOSIS — I872 Venous insufficiency (chronic) (peripheral): Secondary | ICD-10-CM | POA: Diagnosis present

## 2024-01-31 DIAGNOSIS — E871 Hypo-osmolality and hyponatremia: Secondary | ICD-10-CM | POA: Diagnosis present

## 2024-01-31 DIAGNOSIS — E039 Hypothyroidism, unspecified: Secondary | ICD-10-CM | POA: Diagnosis present

## 2024-01-31 DIAGNOSIS — N179 Acute kidney failure, unspecified: Secondary | ICD-10-CM | POA: Diagnosis present

## 2024-01-31 DIAGNOSIS — E876 Hypokalemia: Secondary | ICD-10-CM | POA: Diagnosis present

## 2024-01-31 DIAGNOSIS — I130 Hypertensive heart and chronic kidney disease with heart failure and stage 1 through stage 4 chronic kidney disease, or unspecified chronic kidney disease: Secondary | ICD-10-CM | POA: Diagnosis present

## 2024-01-31 DIAGNOSIS — A419 Sepsis, unspecified organism: Secondary | ICD-10-CM | POA: Diagnosis not present

## 2024-01-31 DIAGNOSIS — I2510 Atherosclerotic heart disease of native coronary artery without angina pectoris: Secondary | ICD-10-CM | POA: Diagnosis present

## 2024-01-31 DIAGNOSIS — E7800 Pure hypercholesterolemia, unspecified: Secondary | ICD-10-CM | POA: Diagnosis present

## 2024-01-31 DIAGNOSIS — D696 Thrombocytopenia, unspecified: Secondary | ICD-10-CM | POA: Diagnosis present

## 2024-01-31 DIAGNOSIS — Z9221 Personal history of antineoplastic chemotherapy: Secondary | ICD-10-CM | POA: Diagnosis not present

## 2024-01-31 DIAGNOSIS — R509 Fever, unspecified: Secondary | ICD-10-CM | POA: Diagnosis present

## 2024-01-31 DIAGNOSIS — I4819 Other persistent atrial fibrillation: Secondary | ICD-10-CM | POA: Diagnosis present

## 2024-01-31 DIAGNOSIS — I5032 Chronic diastolic (congestive) heart failure: Secondary | ICD-10-CM | POA: Diagnosis present

## 2024-01-31 DIAGNOSIS — N1831 Chronic kidney disease, stage 3a: Secondary | ICD-10-CM | POA: Diagnosis present

## 2024-01-31 DIAGNOSIS — Z860100 Personal history of colon polyps, unspecified: Secondary | ICD-10-CM | POA: Diagnosis not present

## 2024-01-31 DIAGNOSIS — Z7901 Long term (current) use of anticoagulants: Secondary | ICD-10-CM | POA: Diagnosis not present

## 2024-01-31 DIAGNOSIS — C8308 Small cell B-cell lymphoma, lymph nodes of multiple sites: Secondary | ICD-10-CM | POA: Diagnosis present

## 2024-01-31 DIAGNOSIS — R6520 Severe sepsis without septic shock: Secondary | ICD-10-CM | POA: Diagnosis present

## 2024-01-31 DIAGNOSIS — N390 Urinary tract infection, site not specified: Secondary | ICD-10-CM | POA: Diagnosis present

## 2024-01-31 DIAGNOSIS — Z8249 Family history of ischemic heart disease and other diseases of the circulatory system: Secondary | ICD-10-CM | POA: Diagnosis not present

## 2024-01-31 DIAGNOSIS — C434 Malignant melanoma of scalp and neck: Secondary | ICD-10-CM | POA: Diagnosis present

## 2024-01-31 DIAGNOSIS — Z825 Family history of asthma and other chronic lower respiratory diseases: Secondary | ICD-10-CM | POA: Diagnosis not present

## 2024-01-31 DIAGNOSIS — Z1152 Encounter for screening for COVID-19: Secondary | ICD-10-CM | POA: Diagnosis not present

## 2024-01-31 LAB — APTT: aPTT: 59 s — ABNORMAL HIGH (ref 24–36)

## 2024-01-31 LAB — CBC
HCT: 35.3 % — ABNORMAL LOW (ref 36.0–46.0)
Hemoglobin: 11.4 g/dL — ABNORMAL LOW (ref 12.0–15.0)
MCH: 29.2 pg (ref 26.0–34.0)
MCHC: 32.3 g/dL (ref 30.0–36.0)
MCV: 90.3 fL (ref 80.0–100.0)
Platelets: 99 K/uL — ABNORMAL LOW (ref 150–400)
RBC: 3.91 MIL/uL (ref 3.87–5.11)
RDW: 14.9 % (ref 11.5–15.5)
WBC: 12.5 K/uL — ABNORMAL HIGH (ref 4.0–10.5)
nRBC: 0 % (ref 0.0–0.2)

## 2024-01-31 LAB — BASIC METABOLIC PANEL WITH GFR
Anion gap: 12 (ref 5–15)
BUN: 26 mg/dL — ABNORMAL HIGH (ref 8–23)
CO2: 23 mmol/L (ref 22–32)
Calcium: 9.3 mg/dL (ref 8.9–10.3)
Chloride: 102 mmol/L (ref 98–111)
Creatinine, Ser: 1.28 mg/dL — ABNORMAL HIGH (ref 0.44–1.00)
GFR, Estimated: 41 mL/min — ABNORMAL LOW
Glucose, Bld: 86 mg/dL (ref 70–99)
Potassium: 4 mmol/L (ref 3.5–5.1)
Sodium: 137 mmol/L (ref 135–145)

## 2024-01-31 LAB — RESPIRATORY PANEL BY PCR

## 2024-01-31 LAB — PRO BRAIN NATRIURETIC PEPTIDE: Pro Brain Natriuretic Peptide: 3065 pg/mL — ABNORMAL HIGH

## 2024-01-31 LAB — LACTIC ACID, PLASMA: Lactic Acid, Venous: 2.2 mmol/L (ref 0.5–1.9)

## 2024-01-31 MED ORDER — ROSUVASTATIN CALCIUM 10 MG PO TABS
5.0000 mg | ORAL_TABLET | ORAL | Status: DC
  Administered 2024-01-31: 5 mg via ORAL
  Filled 2024-01-31: qty 1

## 2024-01-31 MED ORDER — METOPROLOL TARTRATE 5 MG/5ML IV SOLN: 2.5000 mg | INTRAVENOUS | Status: DC | PRN

## 2024-01-31 MED ORDER — SENNOSIDES-DOCUSATE SODIUM 8.6-50 MG PO TABS
2.0000 | ORAL_TABLET | Freq: Two times a day (BID) | ORAL | Status: DC
  Administered 2024-01-31 – 2024-02-02 (×4): 2 via ORAL
  Filled 2024-01-31 (×4): qty 2

## 2024-01-31 MED ORDER — METOPROLOL SUCCINATE ER 25 MG PO TB24
25.0000 mg | ORAL_TABLET | Freq: Every day | ORAL | Status: DC
  Administered 2024-01-31 – 2024-02-02 (×3): 25 mg via ORAL
  Filled 2024-01-31 (×3): qty 1

## 2024-01-31 MED ORDER — APIXABAN 2.5 MG PO TABS
2.5000 mg | ORAL_TABLET | Freq: Two times a day (BID) | ORAL | Status: DC
  Administered 2024-01-31 – 2024-02-02 (×5): 2.5 mg via ORAL
  Filled 2024-01-31 (×5): qty 1

## 2024-01-31 MED ORDER — FUROSEMIDE 20 MG PO TABS
20.0000 mg | ORAL_TABLET | Freq: Every day | ORAL | Status: DC
  Administered 2024-01-31 – 2024-02-02 (×3): 20 mg via ORAL
  Filled 2024-01-31 (×3): qty 1

## 2024-01-31 MED ORDER — SODIUM CHLORIDE 0.9 % IV SOLN
1.0000 g | INTRAVENOUS | Status: DC
  Administered 2024-01-31 – 2024-02-01 (×2): 1 g via INTRAVENOUS
  Filled 2024-01-31 (×2): qty 10

## 2024-01-31 MED ORDER — ACETAMINOPHEN 325 MG PO TABS
650.0000 mg | ORAL_TABLET | Freq: Four times a day (QID) | ORAL | Status: DC | PRN
  Administered 2024-01-31 (×2): 650 mg via ORAL
  Filled 2024-01-31 (×2): qty 2

## 2024-01-31 MED ORDER — ONDANSETRON HCL 4 MG/2ML IJ SOLN
4.0000 mg | Freq: Three times a day (TID) | INTRAMUSCULAR | Status: DC | PRN
  Administered 2024-01-31 (×2): 4 mg via INTRAVENOUS
  Filled 2024-01-31 (×2): qty 2

## 2024-01-31 MED ORDER — HYDRALAZINE HCL 20 MG/ML IJ SOLN: 5.0000 mg | INTRAMUSCULAR | Status: DC | PRN

## 2024-01-31 MED ORDER — LEVOTHYROXINE SODIUM 50 MCG PO TABS
50.0000 ug | ORAL_TABLET | Freq: Every day | ORAL | Status: DC
  Administered 2024-01-31 – 2024-02-02 (×3): 50 ug via ORAL
  Filled 2024-01-31 (×3): qty 1

## 2024-01-31 MED ORDER — MELATONIN 5 MG PO TABS
5.0000 mg | ORAL_TABLET | Freq: Every evening | ORAL | Status: DC | PRN
  Filled 2024-01-31: qty 1

## 2024-01-31 MED ORDER — SODIUM CHLORIDE 0.9 % IV SOLN: INTRAVENOUS | Status: DC

## 2024-01-31 MED ORDER — MIDODRINE HCL 5 MG PO TABS: 10.0000 mg | ORAL_TABLET | Freq: Three times a day (TID) | ORAL | Status: DC

## 2024-01-31 NOTE — Evaluation (Signed)
 Physical Therapy Evaluation & Discharge Patient Details Name: Jacqueline Webb MRN: [id number] DOB: Oct 12, 1940 Today's Date: 01/31/2024  History of Present Illness  Pt is an 84 y/o F admitted on 01/30/24 after presenting with c/o fever, weakness, chills, & increased urinary frequency. Pt is being treated for sepsis 2/2 UTI. PMH: a-fib on Eliquis , small B cell lymphoma, HTT, HLD, CAD, s/p CABG, dCHF, chronic venous insufficiency, hypothyroidism, CKD 3A, thrombocytopenia, HOH  Clinical Impression  Pt seen for PT evaluation with pt agreeable to tx with encouragement 2/2 being tired, daughter present in room. Pt reports prior to admission she was living in 1st floor apartment, ambulatory without AD, denies falls (daughter lives on 3rd floor of apartment complex & can assist PRN). On this date, pt is able to ambulate around unit without AD with mod I without LOB, does use UE to assist BLE in/out of bed PRN. At this time, pt appears to be at or close to baseline level of mobility & does not require acute PT services. Pt would benefit from mobilizing with nursing staff or mobility specialists. PT to complete current orders, please re-consult if new needs arise.      If plan is discharge home, recommend the following: Assistance with cooking/housework   Can travel by private vehicle        Equipment Recommendations None recommended by PT  Recommendations for Other Services       Functional Status Assessment Patient has not had a recent decline in their functional status     Precautions / Restrictions Precautions Precautions: Fall Restrictions Weight Bearing Restrictions Per Provider Order: No      Mobility  Bed Mobility Overal bed mobility: Modified Independent Bed Mobility: Supine to Sit, Sit to Supine     Supine to sit: Modified independent (Device/Increase time), HOB elevated, Used rails (exit R side of bed, uses UE to assist LE off EOB PRN) Sit to supine: Modified independent  (Device/Increase time), HOB elevated, Used rails (extra time to elevate BLE onto bed)        Transfers Overall transfer level: Modified independent Equipment used: None               General transfer comment: sit<>stand from EOB    Ambulation/Gait Ambulation/Gait assistance: Modified independent (Device/Increase time) Gait Distance (Feet): 160 Feet Assistive device: None Gait Pattern/deviations: Step-through pattern Gait velocity: decreased     General Gait Details: no LOB  Stairs            Wheelchair Mobility     Tilt Bed    Modified Rankin (Stroke Patients Only)       Balance Overall balance assessment: Needs assistance Sitting-balance support: Feet supported Sitting balance-Leahy Scale: Good     Standing balance support: No upper extremity supported, During functional activity Standing balance-Leahy Scale: Good                               Pertinent Vitals/Pain Pain Assessment Pain Assessment: Faces Faces Pain Scale: Hurts a little bit Pain Location: R groin Pain Descriptors / Indicators: Discomfort Pain Intervention(s): Monitored during session    Home Living Family/patient expects to be discharged to:: Private residence Living Arrangements: Alone Available Help at Discharge: Family;Available PRN/intermittently Type of Home: Apartment Home Access: Level entry       Home Layout: One level Home Equipment: None      Prior Function Prior Level of Function : Independent/Modified Independent;Driving  Mobility Comments: independent without AD, denies falls       Extremity/Trunk Assessment   Upper Extremity Assessment Upper Extremity Assessment: Overall WFL for tasks assessed    Lower Extremity Assessment Lower Extremity Assessment: Generalized weakness;Overall WFL for tasks assessed       Communication   Communication Communication: Impaired Factors Affecting Communication: Hearing impaired     Cognition Arousal: Alert Behavior During Therapy: WFL for tasks assessed/performed   PT - Cognitive impairments: Orientation   Orientation impairments: Situation                   PT - Cognition Comments: unsure of why she's in the hospital, PT educating her on this Following commands: Intact       Cueing Cueing Techniques: Verbal cues     General Comments General comments (skin integrity, edema, etc.): SpO2 97% after gait    Exercises     Assessment/Plan    PT Assessment Patient does not need any further PT services  PT Problem List         PT Treatment Interventions      PT Goals (Current goals can be found in the Care Plan section)  Acute Rehab PT Goals Patient Stated Goal: rest PT Goal Formulation: All assessment and education complete, DC therapy Time For Goal Achievement: 02/14/24 Potential to Achieve Goals: Good    Frequency       Co-evaluation               AM-PAC PT 6 Clicks Mobility  Outcome Measure Help needed turning from your back to your side while in a flat bed without using bedrails?: None Help needed moving from lying on your back to sitting on the side of a flat bed without using bedrails?: None Help needed moving to and from a bed to a chair (including a wheelchair)?: None Help needed standing up from a chair using your arms (e.g., wheelchair or bedside chair)?: None Help needed to walk in hospital room?: None Help needed climbing 3-5 steps with a railing? : None 6 Click Score: 24    End of Session   Activity Tolerance: Patient tolerated treatment well Patient left: in bed;with call bell/phone within reach;with bed alarm set;with family/visitor present        Time: 1450-1458 PT Time Calculation (min) (ACUTE ONLY): 8 min   Charges:   PT Evaluation $PT Eval Low Complexity: 1 Low   PT General Charges $$ ACUTE PT VISIT: 1 Visit         Richerd Pinal, PT, DPT 01/31/2024, 3:08 PM   Richerd CHRISTELLA Pinal 01/31/2024, 3:07 PM

## 2024-01-31 NOTE — Evaluation (Signed)
 Occupational Therapy Evaluation Patient Details Name: Jacqueline Webb MRN: [id number] DOB: 09-01-1940 Today's Date: 01/31/2024   History of Present Illness   Pt is an 84 y/o F admitted on 01/30/24 after presenting with c/o fever, weakness, chills, & increased urinary frequency. Pt is being treated for sepsis 2/2 UTI. PMH: a-fib on Eliquis , small B cell lymphoma, HTT, HLD, CAD, s/p CABG, dCHF, chronic venous insufficiency, hypothyroidism, CKD 3A, thrombocytopenia, HOH     Clinical Impressions Patient presenting with decreased Ind with self care,balance, functional mobility, transfers, endurance, and safety awareness. Patient reports living at home alone and being Ind without use of AD. Pt demonstrates bed mobility without assistance and ambulates in room with supervision. Pt does fatigue quickly this session and returns to bed.  Patient will benefit from acute OT to increase overall independence in the areas of ADLs, functional mobility, and safety awareness in order to safely discharge.     If plan is discharge home, recommend the following:   A little help with walking and/or transfers;A little help with bathing/dressing/bathroom;Help with stairs or ramp for entrance;Assist for transportation;Supervision due to cognitive status     Functional Status Assessment   Patient has had a recent decline in their functional status and demonstrates the ability to make significant improvements in function in a reasonable and predictable amount of time.     Equipment Recommendations   BSC/3in1      Precautions/Restrictions   Precautions Precautions: Fall     Mobility Bed Mobility Overal bed mobility: Modified Independent                  Transfers Overall transfer level: Needs assistance Equipment used: None Transfers: Sit to/from Stand, Bed to chair/wheelchair/BSC Sit to Stand: Supervision Stand pivot transfers: Supervision                Balance Overall  balance assessment: Needs assistance Sitting-balance support: Feet supported Sitting balance-Leahy Scale: Good     Standing balance support: No upper extremity supported, During functional activity Standing balance-Leahy Scale: Good                             ADL either performed or assessed with clinical judgement   ADL Overall ADL's : Needs assistance/impaired                                       General ADL Comments: supervision overall without use of AD     Vision Patient Visual Report: No change from baseline              Pertinent Vitals/Pain Pain Assessment Pain Assessment: Faces Faces Pain Scale: Hurts little more Pain Location: R groin Pain Descriptors / Indicators: Discomfort Pain Intervention(s): Monitored during session, Repositioned     Extremity/Trunk Assessment Upper Extremity Assessment Upper Extremity Assessment: Overall WFL for tasks assessed   Lower Extremity Assessment Lower Extremity Assessment: Overall WFL for tasks assessed       Communication Communication Communication: Impaired Factors Affecting Communication: Hearing impaired   Cognition Arousal: Alert Behavior During Therapy: WFL for tasks assessed/performed Cognition: No apparent impairments                               Following commands: Intact       Cueing  General Comments  Cueing Techniques: Verbal cues  SpO2 97% after gait           Home Living Family/patient expects to be discharged to:: Private residence Living Arrangements: Alone Available Help at Discharge: Family;Available PRN/intermittently (daughter and brother) Type of Home: Apartment Home Access: Level entry     Home Layout: One level     Bathroom Shower/Tub: Tub/shower unit         Home Equipment: None          Prior Functioning/Environment Prior Level of Function : Independent/Modified Independent;Driving             Mobility  Comments: independent without AD, denies falls      OT Problem List: Decreased strength;Decreased safety awareness;Decreased activity tolerance;Impaired balance (sitting and/or standing);Decreased knowledge of precautions;Pain   OT Treatment/Interventions: Self-care/ADL training;Therapeutic activities;Therapeutic exercise;Patient/family education;Energy conservation;Balance training      OT Goals(Current goals can be found in the care plan section)   Acute Rehab OT Goals Patient Stated Goal: to go home OT Goal Formulation: With patient Time For Goal Achievement: 02/14/24 Potential to Achieve Goals: Fair ADL Goals Pt Will Perform Grooming: Independently;standing Pt Will Perform Lower Body Dressing: Independently;sit to/from stand Pt Will Transfer to Toilet: Independently;ambulating Pt Will Perform Toileting - Clothing Manipulation and hygiene: Independently;sit to/from stand   OT Frequency:  Min 2X/week       AM-PAC OT 6 Clicks Daily Activity     Outcome Measure Help from another person eating meals?: None Help from another person taking care of personal grooming?: None Help from another person toileting, which includes using toliet, bedpan, or urinal?: A Little Help from another person bathing (including washing, rinsing, drying)?: A Little Help from another person to put on and taking off regular upper body clothing?: None Help from another person to put on and taking off regular lower body clothing?: A Little 6 Click Score: 21   End of Session Equipment Utilized During Treatment: Rolling walker (2 wheels)  Activity Tolerance: Patient tolerated treatment well Patient left: in bed;with call bell/phone within reach;with bed alarm set  OT Visit Diagnosis: Unsteadiness on feet (R26.81);Repeated falls (R29.6);Muscle weakness (generalized) (M62.81)                Time: 1000-1019 OT Time Calculation (min): 19 min Charges:  OT General Charges $OT Visit: 1 Visit OT  Evaluation $OT Eval Moderate Complexity: [address] [place], OTR/L , CBIS ascom [phone]  01/31/2024, 3:34 PM

## 2024-01-31 NOTE — Consult Note (Signed)
 [facility] CONSULT NOTE  Patient Care Team: Rennie Cindy SAUNDERS, MD as PCP - General (Internal Medicine) Dessa, Reyes ORN, MD (General Surgery) Hester Wolm PARAS, MD as Consulting Physician (Cardiology) Mevelyn JONETTA Bathe, OD as Consulting Physician (Optometry) Rennie Cindy SAUNDERS, MD as Consulting Physician (Internal Medicine)  CHIEF COMPLAINTS/PURPOSE OF CONSULTATION: SLL/UTI with fever.   Oncology History Overview Note  1. Lymphoma low-grade, status Rituxan  therapy. 2. Recurrent disease by clinical examination in December of 2012 3. Ovarian mass on PET scan in December of 2012 (normal CA 125). Ovarian mass has resolved after rituximab  therapy. 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, 2016) 6, patient started on bendamustine  and Rituxan  because of progressive disease and symptomatic disease (May 11, 2014) 7.  Patient had total 5 cycles of chemotherapy with bendamustine  and rituximab  and 6 cycle was omitted because of significant side effects with and weakness and fatigue.  # OCT 2016- PET significant response; ON surveillance  # 18th OCT 2018- Gazyva ;SEP 2018-/OCT 2018 CT scan- progression; s/p 6 cycles [#6 in April 2019]  # JAN 2020-CT scan shows progressive disease; start a acalabrutinib  100mg  BID;# feb 6th2020- decrease dose of acalbrutinib to 100 mg/day [sec to spon bruising] March 1st week- STOPPED Acala sec to spon brusing  # MARCH 17th 2020- Start Venetoclax  [out pt ramp up]; 300 mg a day; Discontiued  in AUg 2024. CT CAP-N: AUG 2024-progressive lymphadenopathy.   # Brukinsa - MID SEp 2024- 3 pills a day - STOPPED Brukinsa   sec to spon brusing in April 2025.    # 11/06/2016- colonoscopy [Dr.Byrnett- 2 polyps]  #History of A. Fib-Xarelto; CKD stage II-III.   # #Right posterior neck melanoma stage I [ Dr.Lee/Elkins]..    -------------------------------------------------------------------    # left ? Adnexal mass- ? Etiology; mild SUV uptake incidental [stable compared to previous PET in 2016] Previous workup 2015- vaginal ultrasound negative. MRI February 2018- approximately 3 cm in size; slow increase in the size of the left adnexal mass over at least 3 years- suspect involvement of lymphoma rather than a primary gynecologic malignancy. --------------------------------------------------------------   DIAGNOSIS: SMALL LYMPHOCYTIC LEUKEMIA  STAGE:  IV    ;GOALS: control/pallaitive  CURRENT/MOST RECENT THERAPY: Venatoclax.    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   Alone  Jacqueline Webb 84 y.o.  female pleasant patient with history of A-fib on Eliquis  and also B-cell lymphoma/CLL-current on surveillance; and a history of CAD/CABG history of diastolic heart failure chronic insufficiency bilateral extremities, CKD-stage III-is currently admitted to hospital for UTI.  Patient noted to have fairly rapid onset of fevers and chills with generalized weakness prior to admission.  Patient noted to have a fever of 102 on admission.  With regards to SLL-patient currently is currently on surveillance because of poor tolerance to oral TKI therapy.   Of note patient stopped taking her Lasix  as she felt that she was getting dehydrated because of her recent illness.  Otherwise patient denies any blood in stools black loose stools.  Patient also denies any nausea vomiting abdominal pain.  Review of Systems  Constitutional:  Positive for chills, fever, malaise/fatigue and weight loss. Negative for diaphoresis.  HENT:  Negative for nosebleeds and sore  throat.   Eyes:  Negative for double vision.  Respiratory:  Positive for shortness of breath. Negative for cough, hemoptysis, sputum production and wheezing.   Cardiovascular:  Negative for chest pain, palpitations, orthopnea and leg swelling.  Gastrointestinal:  Negative for abdominal pain, blood in stool, constipation, diarrhea, heartburn, melena, nausea and vomiting.  Genitourinary:  Positive for frequency and urgency.  Musculoskeletal:  Negative for back pain and joint pain.  Skin: Negative.  Negative for itching and rash.  Neurological:  Positive for weakness. Negative for dizziness, tingling, focal weakness and headaches.  Endo/Heme/Allergies:  Does not bruise/bleed easily.  Psychiatric/Behavioral:  Negative for depression. The patient is not nervous/anxious and does not have insomnia.     MEDICAL HISTORY:  Past Medical History:  Diagnosis Date   A-fib [facility])    Benign neoplasm of rectum and anal canal    Chronic leukemia of unspecified cell type, without mention of having achieved remission    Dysrhythmia    A-Fib   Hypercholesteremia    Hypertension    Hyperthyroidism    Lymphoma (HCC) 2007   Non Hodgkin's lymphoma (HCC) 01/22/2006   Personal history of chemotherapy     SURGICAL HISTORY: Past Surgical History:  Procedure Laterality Date   ABDOMINAL HYSTERECTOMY  1958   APPENDECTOMY     BREAST BIOPSY Right 1970   right breast biopsy with clip- neg   BREAST BIOPSY Left 2002   left breast biopsy with clip-neg   BREAST BIOPSY Right 2014   right breast biopsy with clip-neg   BREAST CYST ASPIRATION Bilateral 2000   bilateral fine needle aspiration   BREAST SURGERY Left 1990   biopsy   BREAST SURGERY Right May 08, 2012   complex fibroadenoma without malignancy.   CARDIAC CATHETERIZATION  2014   COLONOSCOPY  2009, 2012    hyperplastic rectal polyp 2012 tubular adenoma of proximal ascending colon 2000 9 repeat exam due to 2017.   COLONOSCOPY WITH PROPOFOL  N/A  11/06/2016   Procedure: COLONOSCOPY WITH PROPOFOL ;  Surgeon: Dessa Reyes ORN, MD;  Location: [facility] ENDOSCOPY;  Service: Endoscopy;  Laterality: N/A;   COLONOSCOPY WITH PROPOFOL  N/A 02/27/2019   Procedure: COLONOSCOPY WITH PROPOFOL ;  Surgeon: Dessa Reyes ORN, MD;  Location: [facility] ENDOSCOPY;  Service: General;  Laterality: N/A;   CORONARY ARTERY BYPASS GRAFT  1999   FLEXIBLE SIGMOIDOSCOPY  1998    SOCIAL HISTORY: Social History   Socioeconomic History   Marital status: Married    Spouse name: Not on file   Number of children: 2   Years of education: In [place]   Highest education level: 10th grade  Occupational History   Occupation: retired  Tobacco Use   Smoking status: Never   Smokeless tobacco: Never  Vaping Use   Vaping status: Never Used  Substance and Sexual Activity   Alcohol  use: Yes    Alcohol /week: 0.0 - 7.0 standard drinks of alcohol     Comment: 1 drink a day of wine or cocktail   Drug use: No   Sexual activity: Never  Other Topics Concern   Not on file  Social History Narrative   Not on file   Social Drivers of Health   Tobacco Use: Low Risk (01/30/2024)   Patient History    Smoking Tobacco Use: Never    Smokeless Tobacco Use: Never    Passive Exposure: Not on file  Financial Resource Strain: Low Risk (03/27/2022)   Overall Financial Resource Strain (CARDIA)  Difficulty of Paying Living Expenses: Not hard at all  Food Insecurity: No Food Insecurity (01/31/2024)   Epic    Worried About Programme Researcher, Broadcasting/film/video in the Last Year: Never true    Ran Out of Food in the Last Year: Never true  Transportation Needs: No Transportation Needs (01/31/2024)   Epic    Lack of Transportation (Medical): No    Lack of Transportation (Non-Medical): No  Physical Activity: Inactive (03/27/2022)   Exercise Vital Sign    Days of Exercise per Week: 0 days    Minutes of Exercise per Session: 0 min  Stress: Stress Concern Present (03/27/2022)   [employer] of Occupational Health -  Occupational Stress Questionnaire    Feeling of Stress : Rather much  Social Connections: Moderately Integrated (01/31/2024)   Social Connection and Isolation Panel    Frequency of Communication with Friends and Family: More than three times a week    Frequency of Social Gatherings with Friends and Family: More than three times a week    Attends Religious Services: More than 4 times per year    Active Member of [employer] or Organizations: Yes    Attends Banker Meetings: More than 4 times per year    Marital Status: Widowed  Intimate Partner Violence: Not At Risk (01/31/2024)   Epic    Fear of Current or Ex-Partner: No    Emotionally Abused: No    Physically Abused: No    Sexually Abused: No  Depression (PHQ2-9): Low Risk (11/04/2023)   Depression (PHQ2-9)    PHQ-2 Score: 0  Alcohol  Screen: Low Risk (03/27/2022)   Alcohol  Screen    Last Alcohol  Screening Score (AUDIT): 0  Housing: Low Risk (01/31/2024)   Epic    Unable to Pay for Housing in the Last Year: No    Number of Times Moved in the Last Year: 0    Homeless in the Last Year: No  Utilities: Not At Risk (01/31/2024)   Epic    Threatened with loss of utilities: No  Health Literacy: Not on file    FAMILY HISTORY: Family History  Problem Relation Age of Onset   Heart disease Mother        Died age 35   Asthma Mother    Lung cancer Father        Died age 54   Heart attack Sister    Tuberculosis Sister        84 years old   Lung cancer Sister        Died in her 74s   Angina Sister    Breast cancer Sister 11   Rheum arthritis Sister    Aneurysm Sister        Brain, died age 59   Angina Sister    CAD Brother        double bypass   Heart disease Brother        Stent placement   Heart disease Brother        stent placement   Cancer Niece        breast cancer niece, pancreatic cancer niece   Breast cancer Niece    Ovarian cancer Neg Hx    Diabetes Neg Hx     ALLERGIES:  is allergic to  pravastatin.  MEDICATIONS:  Current Facility-Administered Medications  Medication Dose Route Frequency Provider Last Rate Last Admin   acetaminophen  (TYLENOL ) tablet 650 mg  650 mg Oral Q6H PRN Niu, Xilin, MD   650  mg at 01/31/24 2042   apixaban  (ELIQUIS ) tablet 2.5 mg  2.5 mg Oral BID Niu, Xilin, MD   2.5 mg at 01/31/24 2042   cefTRIAXone  (ROCEPHIN ) 1 g in sodium chloride  0.9 % 100 mL IVPB  1 g Intravenous Q24H Niu, Xilin, MD       furosemide  (LASIX ) tablet 20 mg  20 mg Oral Daily Zhang, Dekui, MD   20 mg at 01/31/24 1254   hydrALAZINE  (APRESOLINE ) injection 5 mg  5 mg Intravenous Q2H PRN Niu, Xilin, MD       levothyroxine  (SYNTHROID ) tablet 50 mcg  50 mcg Oral Q0600 Niu, Xilin, MD   50 mcg at 01/31/24 0555   melatonin tablet 5 mg  5 mg Oral QHS PRN Niu, Xilin, MD       metoprolol  succinate (TOPROL -XL) 24 hr tablet 25 mg  25 mg Oral Daily Zhang, Dekui, MD   25 mg at 01/31/24 1253   metoprolol  tartrate (LOPRESSOR ) injection 2.5 mg  2.5 mg Intravenous Q2H PRN Niu, Xilin, MD       ondansetron  (ZOFRAN ) injection 4 mg  4 mg Intravenous Q8H PRN Niu, Xilin, MD   4 mg at 01/31/24 2042   rosuvastatin  (CRESTOR ) tablet 5 mg  5 mg Oral Once per day on Monday Wednesday Friday Niu, Xilin, MD   5 mg at 01/31/24 9043   senna-docusate (Senokot-S) tablet 2 tablet  2 tablet Oral BID Laurita Pillion, MD   2 tablet at 01/31/24 2042   Facility-Administered Medications Ordered in Other Encounters  Medication Dose Route Frequency Provider Last Rate Last Admin   heparin  lock flush 100 unit/mL  500 Units Intravenous Once Earlin Sweeden R, MD       sodium chloride  flush (NS) 0.9 % injection 10 mL  10 mL Intravenous PRN Jameire Kouba R, MD        PHYSICAL EXAMINATION:   Vitals:   01/31/24 1545 01/31/24 2010  BP: 127/64 131/74  Pulse: 83 100  Resp: 14 15  Temp: 98.3 F (36.8 C) (!) 100.5 F (38.1 C)  SpO2: 100% 97%   Filed Weights   01/30/24 2140  Weight: 145 lb (65.8 kg)   Bilateral axillary  adenopathy right more than left.   Physical Exam Vitals and nursing note reviewed.  HENT:     Head: Normocephalic and atraumatic.     Mouth/Throat:     Pharynx: Oropharynx is clear.  Eyes:     Extraocular Movements: Extraocular movements intact.     Pupils: Pupils are equal, round, and reactive to light.  Cardiovascular:     Rate and Rhythm: Normal rate. Rhythm irregular.  Pulmonary:     Comments: Decreased breath sounds bilaterally.  Abdominal:     Palpations: Abdomen is soft.  Musculoskeletal:        General: Normal range of motion.     Cervical back: Normal range of motion.  Skin:    General: Skin is warm.  Neurological:     General: No focal deficit present.     Mental Status: She is alert and oriented to person, place, and time.  Psychiatric:        Behavior: Behavior normal.        Judgment: Judgment normal.     LABORATORY DATA:  I have reviewed the data as listed Lab Results  Component Value Date   WBC 12.5 (H) 01/31/2024   HGB 11.4 (L) 01/31/2024   HCT 35.3 (L) 01/31/2024   MCV 90.3 01/31/2024   PLT 99 (  L) 01/31/2024   Recent Labs    05/27/23 1258 07/08/23 1436 10/07/23 1246 01/30/24 2226 01/31/24 0727  NA 134* 132* 133* 130* 137  K 4.0 4.1 3.9 3.6 4.0  CL 104 101 95* 95* 102  CO2 21* 23 29 23 23   GLUCOSE 113* 97 109* 120* 86  BUN 25* 16 18 28* 26*  CREATININE 1.19* 0.89 0.89 1.39* 1.28*  CALCIUM  8.7* 9.0 9.3 9.0 9.3  GFRNONAA 45* >60 >60 37* 41*  PROT 6.2* 6.3*  --  5.9*  --   ALBUMIN 3.5 3.7  --  3.7  --   AST 34 30  --  21  --   ALT 24 20  --  15  --   ALKPHOS 84 85  --  112  --   BILITOT 0.8 0.9  --  0.8  --     RADIOGRAPHIC STUDIES: I have personally reviewed the radiological images as listed and agreed with the findings in the report. DG Chest 2 View Result Date: 01/30/2024 CLINICAL DATA:  Fever EXAM: CHEST - 2 VIEW COMPARISON:  07/06/2005, CT 09/21/2022 FINDINGS: Cardiomegaly with small pleural effusions. Aortic atherosclerosis. No  focal consolidation. IMPRESSION: Cardiomegaly with small pleural effusions. Electronically Signed   By: Luke Bun M.D.   On: 01/30/2024 22:16   No problem-specific Assessment & Plan notes found for this encounter.  # 84 year old female patient with history of A-fib on Eliquis  and also B-cell lymphoma/CLL-current on surveillance; and a history of CAD/CABG history of diastolic heart failure chronic insufficiency bilateral extremities, CKD-stage III-is currently admitted to hospital for UTI/sepsis.  # SLL/B-cell lymphoma-currently on surveillance/off any therapy; given patient's poor tolerance to recent therapy including-Brukinsa .   # Thrombocytopenia-mild to moderate secondary to SLL.  # UTI with meeting SIRS criteria.  Urine/blood cultures pending.  Currently on ceftriaxone   # A-fib-on Eliquis  CKD  # Recommendations/plan:  # With regards to SLL-patient noted to have mild progression of disease especially in the axilla right more than left.  Will plan follow-up CT scan chest abdomen pelvis outpatient imaging in the next few weeks after resolution of acute issues/hospitalization.  # Given the infection-recommend checking quantitative immunoglobulins.  If IgG levels less than 400-consider IVIG infusions for secondary prophylaxis of infections.  # Thank you Dr. Laurita MD for allowing me to participate in the care of your pleasant patient. Please do not hesitate to contact me with questions or concerns in the interim.   Above plan of care was discussed with patient  in detail; and she is in agreement.    Cindy JONELLE Joe, MD 01/31/2024 8:49 PM

## 2024-01-31 NOTE — Hospital Course (Signed)
"   Jacqueline Webb is a 84 y.o. 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  Patient is placed on Rocephin  with urine and blood culture sent out. "

## 2024-01-31 NOTE — Plan of Care (Signed)
 Notified [facility] critical Lactic 2.2

## 2024-01-31 NOTE — Progress Notes (Addendum)
 " Progress Note   Patient: Jacqueline Webb FMW:[id number] DOB: [birth year] DOA: 01/30/2024     0 DOS: the patient was seen and examined on 01/31/2024   Brief hospital course:  Jacqueline Webb is a 83 y.o. 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  Patient is placed on Rocephin  with urine and blood culture sent out.   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  Lactic acid elevated to 2.2.  Patient also has acute kidney injury. Currently pending urine culture and blood culture, more hemodynamically stable, patient can be monitored on the general medical floor now.  Continue Rocephin .   Chronic diastolic CHF (congestive heart failure) (HCC): 2D echo on 05/08/2023 showed EF> 55%.  Patient has chronic venous insufficiency change and leg edema bilaterally.  BNP is elevated at 3065, but no oxygen desaturation.  No acute CHF exacerbation. Still has no shortness of breath, no evidence of exacerbation.  But will start home dose oral Lasix  as patient is more hemodynamic stable.  Acute kidney injury. Hyponatremia. Not consistent with CKD 3A. Sodium level has normalized since  admission, continue monitor renal function with restarting of oral Lasix .  Essential (primary) hypertension -IV hydralazine  as needed Blood pressure not slightly elevated, continue to hold blood pressure medicines, plan to restart metoprolol  for atrial fibrillation.   Hypercholesteremia -Crestor    CAD (coronary artery disease) of bypass graft: No chest pain -Hold Imdure - Continue Crestor  - Patient is on Eliquis    Adult hypothyroidism -Synthroid    Persistent atrial fibrillation [facility]): Heart rate 122 --> 83 Continue Eliquis , restart metoprolol .   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 Bilateral leg weakness.   S/p of Rituxan  and Brukinsa  treatment.  Patient had significant leg weakness, but no incontinence to urine.  This could be due to UTI, but we will get oncology consult to see if we need to consider MRI scan of the lumbar and thoracic spine.  Addendum.  Patient refused to perform a MRI, will consider CT with contrast, but will await for renal function to recover.     Subjective:  Patient had some shaking chills today, temperature was 99.7.  She also complained of significant bilateral leg weakness.  Denied any short of breath or cough.  Physical Exam: Vitals:   01/31/24 0351 01/31/24 0353 01/31/24 0437 01/31/24 0907  BP: 115/61  115/61 (!) 107/53  Pulse:  74 74 94  Resp: 18  18 (!) 21  Temp: 98.2 F (36.8 C)  98.2 F (36.8 C) 99.6 F (37.6 C)  TempSrc: Oral  Oral Oral  SpO2: 96%  96% 96%  Weight:      Height:       General exam: Appears  calm and comfortable  Respiratory system: Clear to auscultation. Respiratory effort normal. Cardiovascular system: S1 & S2 heard, RRR. No JVD, murmurs, rubs, gallops or clicks. No pedal edema. Gastrointestinal system: Abdomen is nondistended, soft and nontender. No organomegaly or masses felt. Normal bowel sounds heard. Central nervous system: Alert and oriented x3. No focal  neurological deficits. Extremities: Symmetric 5 x 5 power. Skin: No rashes, lesions or ulcers Psychiatry: Judgement and insight appear normal. Mood & affect appropriate.    Data Reviewed:  Reviewed chest x-ray and lab results.  Family Communication: Brother updated at the bedside  Disposition: Status is: Inpatient Remains inpatient appropriate because: Severity of disease, IV treatment     Time spent: 50 minutes  Author: Murvin Mana, MD 01/31/2024 10:30 AM  For on call review [email or web address].    "

## 2024-02-01 LAB — BASIC METABOLIC PANEL WITH GFR
Anion gap: 9 (ref 5–15)
BUN: 21 mg/dL (ref 8–23)
CO2: 25 mmol/L (ref 22–32)
Calcium: 9 mg/dL (ref 8.9–10.3)
Chloride: 103 mmol/L (ref 98–111)
Creatinine, Ser: 1.02 mg/dL — ABNORMAL HIGH (ref 0.44–1.00)
GFR, Estimated: 54 mL/min — ABNORMAL LOW
Glucose, Bld: 96 mg/dL (ref 70–99)
Potassium: 3.2 mmol/L — ABNORMAL LOW (ref 3.5–5.1)
Sodium: 137 mmol/L (ref 135–145)

## 2024-02-01 LAB — MAGNESIUM: Magnesium: 1.8 mg/dL (ref 1.7–2.4)

## 2024-02-01 NOTE — Progress Notes (Signed)
 " Progress Note   Patient: Jacqueline Webb DOB: May 27, 1940 DOA: 01/30/2024     1 DOS: the patient was seen and examined on 02/01/2024   Brief hospital course:  Jacqueline Webb is a 84 y.o. 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  Blood culture was negative, urine culture grew Klebsiella, patient was continued on Rocephin .   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  Lactic acid elevated to 2.2.  Patient also has acute kidney injury. Urine culture grew Klebsiella, will continue Rocephin .  Probably discharge home tomorrow when blood culture finalized.   Chronic diastolic CHF (congestive heart failure) (HCC): 2D echo on 05/08/2023 showed EF> 55%.  Patient has chronic venous insufficiency change and leg edema bilaterally.  BNP is elevated at 3065, but no oxygen desaturation.  No acute CHF exacerbation. Still has no shortness of breath, no evidence of exacerbation.  But started home dose oral Lasix  as patient is more hemodynamic stable. Patient still has no evidence of volume overload.   Acute kidney injury. Hyponatremia. Hypokalemia. Not consistent  with CKD 3A. Sodium level has normalized since admission, continue monitor renal function with restarting of oral Lasix . Replete potassium.  Renal function improving.   Essential (primary) hypertension -IV hydralazine  as needed Continue metoprolol .   Hypercholesteremia -Crestor    CAD (coronary artery disease) of bypass graft: No chest pain -Hold Imdure - Continue Crestor  - Patient is on Eliquis    Adult hypothyroidism -Synthroid    Persistent atrial fibrillation (HCC): Heart rate 122 --> 83 Continue Eliquis , restarted metoprolol .   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 Bilateral leg weakness.   S/p of Rituxan  and Brukinsa  treatment.  Back with weakness much better after starting antibiotics.  Discussed with oncology, no need for spine workup.         Subjective:  Patient feels much better today, feels stronger.  Physical Exam: Vitals:   01/31/24 1545 01/31/24 2010 02/01/24 0442 02/01/24 0852  BP: 127/64 131/74 128/69 (!) 141/61  Pulse: 83 100 76 94  Resp: 14 15 16 18   Temp: 98.3 F (36.8 C) (!) 100.5 F (38.1 C) 98 F (36.7 C) 98.6 F (37 C)  TempSrc:  Oral    SpO2: 100% 97% 100% 97%  Weight:      Height:       General exam: Appears calm and comfortable  Respiratory system: Clear to auscultation. Respiratory effort normal. Cardiovascular system: S1 & S2 heard, RRR. No JVD, murmurs, rubs, gallops or clicks. No pedal edema. Gastrointestinal system: Abdomen is nondistended, soft and nontender. No organomegaly or masses felt. Normal bowel sounds heard. Central nervous system: Alert and oriented. No focal neurological deficits. Extremities: Symmetric 5 x 5  power. Skin: No rashes, lesions or ulcers Psychiatry: Judgement and insight appear normal. Mood & affect appropriate.    Data Reviewed:  Lab results reviewed.  Family Communication: None  Disposition: Status is: Inpatient Remains inpatient  appropriate because: Severity of disease, IV treatment.     Time spent: 35 minutes  Author: Murvin Mana, MD 02/01/2024 12:33 PM  For on call review [email or web address].    "

## 2024-02-02 ENCOUNTER — Other Ambulatory Visit: Payer: Self-pay

## 2024-02-02 LAB — BASIC METABOLIC PANEL WITH GFR
Anion gap: 8 (ref 5–15)
BUN: 18 mg/dL (ref 8–23)
CO2: 27 mmol/L (ref 22–32)
Calcium: 9.2 mg/dL (ref 8.9–10.3)
Chloride: 103 mmol/L (ref 98–111)
Creatinine, Ser: 0.92 mg/dL (ref 0.44–1.00)
GFR, Estimated: >60 mL/min
Glucose, Bld: 94 mg/dL (ref 70–99)
Potassium: 3.2 mmol/L — ABNORMAL LOW (ref 3.5–5.1)
Sodium: 139 mmol/L (ref 135–145)

## 2024-02-02 LAB — URINE CULTURE: Culture: >=100000 — AB

## 2024-02-02 LAB — MAGNESIUM: Magnesium: 1.7 mg/dL (ref 1.7–2.4)

## 2024-02-02 MED ORDER — POTASSIUM CHLORIDE 20 MEQ PO PACK
40.0000 meq | PACK | ORAL | Status: AC
  Administered 2024-02-02 (×2): 40 meq via ORAL
  Filled 2024-02-02 (×2): qty 2

## 2024-02-02 MED ORDER — POTASSIUM CHLORIDE CRYS ER 10 MEQ PO TBCR
20.0000 meq | EXTENDED_RELEASE_TABLET | Freq: Two times a day (BID) | ORAL | 0 refills | Status: AC
  Filled 2024-02-02: qty 30, 8d supply, fill #0

## 2024-02-02 MED ORDER — MAGNESIUM SULFATE 2 GM/50ML IV SOLN
2.0000 g | Freq: Once | INTRAVENOUS | Status: AC
  Administered 2024-02-02: 2 g via INTRAVENOUS
  Filled 2024-02-02: qty 50

## 2024-02-02 MED ORDER — CEPHALEXIN 500 MG PO CAPS
500.0000 mg | ORAL_CAPSULE | Freq: Three times a day (TID) | ORAL | 0 refills | Status: AC
  Filled 2024-02-02: qty 9, 3d supply, fill #0

## 2024-02-02 NOTE — Discharge Summary (Addendum)
 " Physician Discharge Summary   Patient: Jacqueline Webb MRN: [id number] DOB: 04-12-1940  Admit date:     01/30/2024  Discharge date: 02/02/2024  Discharge Physician: Murvin Mana   PCP: Rennie Cindy SAUNDERS, MD   Recommendations at discharge:   Follow-up with PCP in 1 week Check a BMP in the next office visit, adjust the potassium dose. Follow-up with oncology as scheduled.  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 [facility] Course:  Jacqueline Webb is a 84 y.o. 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  Blood culture was negative, urine culture grew Klebsiella, patient was continued on Rocephin . Urine culture results finalized, will treat with Keflex  to complete 5-day antibiotics.  Leg weakness also improved, medically stable for discharge.  She no longer needs home physical therapy.  Assessment and Plan: Severe sepsis due to urinary tract infection [facility]):  Patient meets criteria for sepsis with WBC 12.4, temperature 103.1, heart rate up to 122, RR 22.  Lactic acid elevated to 2.2.  Patient also has acute kidney injury. Urine culture grew Klebsiella, treated with Rocephin , will transition to oral Keflex .   Chronic diastolic CHF (congestive heart  failure) (HCC): 2D echo on 05/08/2023 showed EF> 55%.  Patient has chronic venous insufficiency change and leg edema bilaterally.  BNP is elevated at 3065, but no oxygen desaturation.  No acute CHF exacerbation. Still has no shortness of breath, no evidence of exacerbation.  But started home dose oral Lasix  as patient is more hemodynamic stable. Clinically, patient doing well, no need for IV Lasix .  Follow-up with PCP to adjust medication.   Acute kidney injury. Hyponatremia. Hypokalemia. Not consistent with CKD 3A. Renal function has improved to normal.  Potassium is 3.2, will give additional 80 mEq of oral potassium before discharge.  Patient be also continued on 20 mEq of potassium twice a day.  She also given 2 g magnesium  sulfate for magnesium  level of 1.7.   Essential (primary) hypertension -IV hydralazine  as needed Continue metoprolol .   Hypercholesteremia -Crestor    CAD (coronary artery disease) of bypass graft: No chest pain -Hold Imdure - Continue Crestor  - Patient is on Eliquis    Adult hypothyroidism -Synthroid    Persistent atrial fibrillation (HCC): Heart rate 122 --> 83 Continue Eliquis , restarted metoprolol .   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 Bilateral leg weakness.   S/p of Rituxan  and Brukinsa  treatment.  Back with weakness much better after starting antibiotics.  Discussed with oncology, no need for spine workup. Patient will continue follow-up with oncology as previously scheduled.          Consultants: Oncology. Procedures performed: None  Disposition: Home Diet recommendation:  Discharge Diet Orders (From admission, onward)     Start     Ordered   02/02/24 0000  Diet -  low sodium heart healthy        02/02/24 1023           Cardiac diet DISCHARGE MEDICATION: Allergies as of 02/02/2024       Reactions   Pravastatin Other (See Comments)        Medication List     STOP  taking these medications    Brukinsa  80 MG capsule Generic drug: zanubrutinib    lisinopril -hydrochlorothiazide  20-12.5 MG tablet Commonly known as: ZESTORETIC    oxybutynin  5 MG tablet Commonly known as: DITROPAN        TAKE these medications    apixaban  2.5 MG Tabs tablet Commonly known as: ELIQUIS  Take 2.5 mg by mouth 2 (two) times daily.   cephALEXin  500 MG capsule Commonly known as: KEFLEX  Take 1 capsule (500 mg total) by mouth 3 (three) times daily for 3 days.   furosemide  20 MG tablet Commonly known as: LASIX  TAKE 1 TABLET BY MOUTH EVERY  MORNING   isosorbide mononitrate 30 MG 24 hr tablet Commonly known as: IMDUR Take 30 mg by mouth daily.   levothyroxine  50 MCG tablet Commonly known as: SYNTHROID  Take 50 mcg by mouth daily before breakfast.   melatonin 5 MG Tabs Take 5 mg by mouth.   metoprolol  succinate 25 MG 24 hr tablet Commonly known as: TOPROL -XL Take 25 mg by mouth daily.   ondansetron  4 MG tablet Commonly known as: Zofran  Take 1 tablet (4 mg total) by mouth every 8 (eight) hours as needed for nausea or vomiting.   potassium chloride  10 MEQ tablet Commonly known as: KLOR-CON  M Take 2 tablets (20 mEq total) by mouth 2 (two) times daily.   rosuvastatin  5 MG tablet Commonly known as: CRESTOR  Take 5 mg by mouth daily. Takes 3 times a week   STOOL SOFTENER PO Take by mouth as needed.        Follow-up Information     Rennie Cindy SAUNDERS, MD Follow up in 1 week(s).   Specialties: Internal Medicine, Oncology Contact information: [address] [place] [address] [phone]                Discharge Exam: [name]   01/30/24 2140  Weight: 65.8 kg   General exam: Appears calm and comfortable  Respiratory system: Clear to auscultation. Respiratory effort normal. Cardiovascular system: Irregular. No JVD, murmurs, rubs, gallops or clicks. No pedal edema. Gastrointestinal system: Abdomen is nondistended, soft and  nontender. No organomegaly or masses felt. Normal bowel sounds heard. Central nervous system: Alert and oriented. No focal neurological deficits. Extremities: Symmetric 5 x 5 power. Skin: No rashes, lesions or ulcers Psychiatry: Judgement and insight appear normal. Mood & affect appropriate.    Condition at discharge: good  The results of significant diagnostics from this hospitalization (including imaging, microbiology, ancillary and laboratory) are listed below for reference.   Imaging Studies: DG Chest 2 View Result Date: 01/30/2024 CLINICAL DATA:  Fever EXAM: CHEST - 2 VIEW COMPARISON:  07/06/2005, CT 09/21/2022 FINDINGS: Cardiomegaly with small pleural effusions. Aortic atherosclerosis. No focal consolidation. IMPRESSION: Cardiomegaly with small pleural effusions. Electronically Signed   By: Luke Bun M.D.   On: 01/30/2024 22:16    Microbiology: Results for orders placed or performed during the hospital encounter of 01/30/24  Culture, blood (Routine x 2)     Status: None (Preliminary result)   Collection Time: 01/30/24 10:26 PM   Specimen: BLOOD  Result Value Ref Range Status   Specimen Description BLOOD BLOOD RIGHT ARM  Final  Special Requests   Final    BOTTLES DRAWN AEROBIC AND ANAEROBIC Blood Culture adequate volume   Culture   Final    NO GROWTH 3 DAYS Performed at [facility], [address] [place], [place] [address]    Report Status PENDING  Incomplete  Culture, blood (Routine x 2)     Status: None (Preliminary result)   Collection Time: 01/30/24 10:26 PM   Specimen: BLOOD  Result Value Ref Range Status   Specimen Description BLOOD BLOOD LEFT ARM  Final   Special Requests   Final    BOTTLES DRAWN AEROBIC AND ANAEROBIC Blood Culture adequate volume   Culture   Final    NO GROWTH 3 DAYS Performed at [facility], [address], [place], [place] [address]    Report Status PENDING  Incomplete  Resp panel by RT-PCR (RSV, Flu A&B, Covid)  Anterior Nasal Swab     Status: None   Collection Time: 01/30/24 10:26 PM   Specimen: Anterior Nasal Swab  Result Value Ref Range Status   SARS Coronavirus 2 by RT PCR NEGATIVE NEGATIVE Final    Comment: (NOTE) SARS-CoV-2 target nucleic acids are NOT DETECTED.  The SARS-CoV-2 RNA is generally detectable in upper respiratory specimens during the acute phase of infection. The lowest concentration of SARS-CoV-2 viral copies this assay can detect is 138 copies/mL. A negative result does not preclude SARS-Cov-2 infection and should not be used as the sole basis for treatment or other patient management decisions. A negative result may occur with  improper specimen collection/handling, submission of specimen other than nasopharyngeal swab, presence of viral mutation(s) within the areas targeted by this assay, and inadequate number of viral copies(<138 copies/mL). A negative result must be combined with clinical observations, patient history, and epidemiological information. The expected result is Negative.  Fact Sheet for Patients:  bloggercourse.com  Fact Sheet for Healthcare Providers:  seriousbroker.it  This test is no t yet approved or cleared by the [place]  FDA and  has been authorized for detection and/or diagnosis of SARS-CoV-2 by FDA under an Emergency Use Authorization (EUA). This EUA will remain  in effect (meaning this test can be used) for the duration of the COVID-19 declaration under Section 564(b)(1) of the Act, 21 U.S.C.section 360bbb-3(b)(1), unless the authorization is terminated  or revoked sooner.       Influenza A by PCR NEGATIVE NEGATIVE Final   Influenza B by PCR NEGATIVE NEGATIVE Final    Comment: (NOTE) The Xpert Xpress SARS-CoV-2/FLU/RSV plus assay is intended as an aid in the diagnosis of influenza from Nasopharyngeal swab specimens and should not be used as a sole basis for treatment. Nasal washings  and aspirates are unacceptable for Xpert Xpress SARS-CoV-2/FLU/RSV testing.  Fact Sheet for Patients: bloggercourse.com  Fact Sheet for Healthcare Providers: seriousbroker.it  This test is not yet approved or cleared by the [place]  FDA and has been authorized for detection and/or diagnosis of SARS-CoV-2 by FDA under an Emergency Use Authorization (EUA). This EUA will remain in effect (meaning this test can be used) for the duration of the COVID-19 declaration under Section 564(b)(1) of the Act, 21 U.S.C. section 360bbb-3(b)(1), unless the authorization is terminated or revoked.     Resp Syncytial Virus by PCR NEGATIVE NEGATIVE Final    Comment: (NOTE) Fact Sheet for Patients: bloggercourse.com  Fact Sheet for Healthcare Providers: seriousbroker.it  This test is not yet approved or cleared by the [place]  FDA and has been authorized for detection  and/or diagnosis of SARS-CoV-2 by FDA under an Emergency Use Authorization (EUA). This EUA will remain in effect (meaning this test can be used) for the duration of the COVID-19 declaration under Section 564(b)(1) of the Act, 21 U.S.C. section 360bbb-3(b)(1), unless the authorization is terminated or revoked.  Performed at [facility], [address], [place], [place] [address]   Urine Culture     Status: Abnormal   Collection Time: 01/30/24 10:26 PM   Specimen: Urine, Random  Result Value Ref Range Status   Specimen Description   Final    URINE, RANDOM Performed at [facility], [address] [place], [place] [address]    Special Requests   Final    NONE Reflexed from [phone] Performed at [facility], [address] [place], [place] [address]    Culture >=100,000 COLONIES/mL KLEBSIELLA PNEUMONIAE (A)  Final   Report Status 02/02/2024 FINAL  Final   Organism ID, Bacteria KLEBSIELLA  PNEUMONIAE (A)  Final      Susceptibility   Klebsiella pneumoniae - MIC*    AMPICILLIN >=32 RESISTANT Resistant     CEFAZOLIN (URINE) Value in next row Sensitive      <=1 SENSITIVEThis is a modified FDA-approved test that has been validated and its performance characteristics determined by the reporting laboratory.  This laboratory is certified under the Clinical Laboratory Improvement Amendments CLIA as qualified to perform high complexity clinical laboratory testing.    CEFEPIME Value in next row Sensitive      <=1 SENSITIVEThis is a modified FDA-approved test that has been validated and its performance characteristics determined by the reporting laboratory.  This laboratory is certified under the Clinical Laboratory Improvement Amendments CLIA as qualified to perform high complexity clinical laboratory testing.    ERTAPENEM Value in next row Sensitive      <=1 SENSITIVEThis is a modified FDA-approved test that has been validated and its performance characteristics determined by the reporting laboratory.  This laboratory is certified under the Clinical Laboratory Improvement Amendments CLIA as qualified to perform high complexity clinical laboratory testing.    CEFTRIAXONE  Value in next row Sensitive      <=1 SENSITIVEThis is a modified FDA-approved test that has been validated and its performance characteristics determined by the reporting laboratory.  This laboratory is certified under the Clinical Laboratory Improvement Amendments CLIA as qualified to perform high complexity clinical laboratory testing.    CIPROFLOXACIN  Value in next row Sensitive      <=1 SENSITIVEThis is a modified FDA-approved test that has been validated and its performance characteristics determined by the reporting laboratory.  This laboratory is certified under the Clinical Laboratory Improvement Amendments CLIA as qualified to perform high complexity clinical laboratory testing.    GENTAMICIN Value in next row Sensitive       <=1 SENSITIVEThis is a modified FDA-approved test that has been validated and its performance characteristics determined by the reporting laboratory.  This laboratory is certified under the Clinical Laboratory Improvement Amendments CLIA as qualified to perform high complexity clinical laboratory testing.    NITROFURANTOIN  Value in next row Resistant      <=1 SENSITIVEThis is a modified FDA-approved test that has been validated and its performance characteristics determined by the reporting laboratory.  This laboratory is certified under the Clinical Laboratory Improvement Amendments CLIA as qualified to perform high complexity clinical laboratory testing.    TRIMETH /SULFA  Value in next row Resistant      <=1 SENSITIVEThis is a modified FDA-approved test that has  been validated and its performance characteristics determined by the reporting laboratory.  This laboratory is certified under the Clinical Laboratory Improvement Amendments CLIA as qualified to perform high complexity clinical laboratory testing.    AMPICILLIN/SULBACTAM Value in next row Sensitive      <=1 SENSITIVEThis is a modified FDA-approved test that has been validated and its performance characteristics determined by the reporting laboratory.  This laboratory is certified under the Clinical Laboratory Improvement Amendments CLIA as qualified to perform high complexity clinical laboratory testing.    PIP/TAZO Value in next row Sensitive      <=4 SENSITIVEThis is a modified FDA-approved test that has been validated and its performance characteristics determined by the reporting laboratory.  This laboratory is certified under the Clinical Laboratory Improvement Amendments CLIA as qualified to perform high complexity clinical laboratory testing.    MEROPENEM Value in next row Sensitive      <=4 SENSITIVEThis is a modified FDA-approved test that has been validated and its performance characteristics determined by the reporting laboratory.   This laboratory is certified under the Clinical Laboratory Improvement Amendments CLIA as qualified to perform high complexity clinical laboratory testing.    * >=100,000 COLONIES/mL KLEBSIELLA PNEUMONIAE  Respiratory (~20 pathogens) panel by PCR     Status: None   Collection Time: 01/31/24 10:30 AM   Specimen: Nasopharyngeal Swab; Respiratory  Result Value Ref Range Status   Adenovirus NOT DETECTED NOT DETECTED Final   Coronavirus 229E NOT DETECTED NOT DETECTED Final    Comment: (NOTE) The Coronavirus on the Respiratory Panel, DOES NOT test for the novel  Coronavirus (2019 nCoV)    Coronavirus HKU1 NOT DETECTED NOT DETECTED Final   Coronavirus NL63 NOT DETECTED NOT DETECTED Final   Coronavirus OC43 NOT DETECTED NOT DETECTED Final   Metapneumovirus NOT DETECTED NOT DETECTED Final   Rhinovirus / Enterovirus NOT DETECTED NOT DETECTED Final   Influenza A NOT DETECTED NOT DETECTED Final   Influenza B NOT DETECTED NOT DETECTED Final   Parainfluenza Virus 1 NOT DETECTED NOT DETECTED Final   Parainfluenza Virus 2 NOT DETECTED NOT DETECTED Final   Parainfluenza Virus 3 NOT DETECTED NOT DETECTED Final   Parainfluenza Virus 4 NOT DETECTED NOT DETECTED Final   Respiratory Syncytial Virus NOT DETECTED NOT DETECTED Final   Bordetella pertussis NOT DETECTED NOT DETECTED Final   Bordetella Parapertussis NOT DETECTED NOT DETECTED Final   Chlamydophila pneumoniae NOT DETECTED NOT DETECTED Final   Mycoplasma pneumoniae NOT DETECTED NOT DETECTED Final    Comment: Performed at [facility], [address], [place], [place] [address]    Labs: CBC: Recent Labs  Lab 01/30/24 2226 01/31/24 0727  WBC 12.4* 12.5*  NEUTROABS 10.3*  --   HGB 10.0* 11.4*  HCT 30.1* 35.3*  MCV 88.0 90.3  PLT 86* 99*   Basic Metabolic Panel: Recent Labs  Lab 01/30/24 2226 01/31/24 0727 02/01/24 0649 02/02/24 0636  NA 130* 137 137 139  K 3.6 4.0 3.2* 3.2*  CL 95* 102 103 103  CO2 23 23 25 27   GLUCOSE  120* 86 96 94  BUN 28* 26* 21 18  CREATININE 1.39* 1.28* 1.02* 0.92  CALCIUM  9.0 9.3 9.0 9.2  MG  --   --  1.8 1.7   Liver Function Tests: Recent Labs  Lab 01/30/24 2226  AST 21  ALT 15  ALKPHOS 112  BILITOT 0.8  PROT 5.9*  ALBUMIN 3.7   CBG: No results for input(s): GLUCAP in the last 168 hours.  Discharge time spent: 35 minutes.  Signed: Murvin Mana, MD [facility] 02/02/2024 "

## 2024-02-03 ENCOUNTER — Telehealth: Payer: Self-pay | Admitting: *Deleted

## 2024-02-03 ENCOUNTER — Ambulatory Visit
Admission: RE | Admit: 2024-02-03 | Discharge: 2024-02-03 | Disposition: A | Discharge status: Home / Self Care | Source: Ambulatory Visit | Attending: Internal Medicine | Admitting: Internal Medicine

## 2024-02-03 ENCOUNTER — Other Ambulatory Visit

## 2024-02-03 DIAGNOSIS — C8308 Small cell B-cell lymphoma, lymph nodes of multiple sites: Secondary | ICD-10-CM | POA: Insufficient documentation

## 2024-02-03 LAB — IGG, IGA, IGM
IgA: 63 mg/dL — ABNORMAL LOW (ref 64–422)
IgG (Immunoglobin G), Serum: 670 mg/dL (ref 586–1602)
IgM (Immunoglobulin M), Srm: 12 mg/dL — ABNORMAL LOW (ref 26–217)

## 2024-02-03 MED ORDER — IOHEXOL 300 MG/ML  SOLN
100.0000 mL | Freq: Once | INTRAMUSCULAR | Status: AC | PRN
  Administered 2024-02-03: 100 mL via INTRAVENOUS

## 2024-02-03 NOTE — Telephone Encounter (Signed)
 Caller verified using pt's full name and dob prior to discussing PHI    Patient called to confirm that her Ct scan is still scheduled for today as planned since she was just d/c from [facility]. RN informed patient that scan is still scheduled. She thanked me for answering her question.

## 2024-02-04 LAB — CULTURE, BLOOD (ROUTINE X 2)
Culture: NO GROWTH
Culture: NO GROWTH
Special Requests: ADEQUATE
Special Requests: ADEQUATE

## 2024-02-14 ENCOUNTER — Other Ambulatory Visit: Payer: Self-pay

## 2024-02-14 ENCOUNTER — Other Ambulatory Visit: Payer: Self-pay | Admitting: *Deleted

## 2024-02-14 ENCOUNTER — Inpatient Hospital Stay: Attending: Internal Medicine

## 2024-02-14 ENCOUNTER — Encounter: Payer: Self-pay | Admitting: Internal Medicine

## 2024-02-14 DIAGNOSIS — C9110 Chronic lymphocytic leukemia of B-cell type not having achieved remission: Secondary | ICD-10-CM | POA: Diagnosis not present

## 2024-02-14 DIAGNOSIS — I4891 Unspecified atrial fibrillation: Secondary | ICD-10-CM | POA: Insufficient documentation

## 2024-02-14 DIAGNOSIS — Z85820 Personal history of malignant melanoma of skin: Secondary | ICD-10-CM | POA: Insufficient documentation

## 2024-02-14 DIAGNOSIS — C8308 Small cell B-cell lymphoma, lymph nodes of multiple sites: Secondary | ICD-10-CM

## 2024-02-14 DIAGNOSIS — Z79899 Other long term (current) drug therapy: Secondary | ICD-10-CM | POA: Diagnosis not present

## 2024-02-14 DIAGNOSIS — D696 Thrombocytopenia, unspecified: Secondary | ICD-10-CM | POA: Diagnosis not present

## 2024-02-14 DIAGNOSIS — I872 Venous insufficiency (chronic) (peripheral): Secondary | ICD-10-CM

## 2024-02-14 DIAGNOSIS — I509 Heart failure, unspecified: Secondary | ICD-10-CM | POA: Diagnosis not present

## 2024-02-14 DIAGNOSIS — D509 Iron deficiency anemia, unspecified: Secondary | ICD-10-CM | POA: Diagnosis present

## 2024-02-14 DIAGNOSIS — I25810 Atherosclerosis of coronary artery bypass graft(s) without angina pectoris: Secondary | ICD-10-CM

## 2024-02-14 DIAGNOSIS — Z7901 Long term (current) use of anticoagulants: Secondary | ICD-10-CM | POA: Diagnosis not present

## 2024-02-14 DIAGNOSIS — Z87440 Personal history of urinary (tract) infections: Secondary | ICD-10-CM | POA: Insufficient documentation

## 2024-02-14 DIAGNOSIS — R609 Edema, unspecified: Secondary | ICD-10-CM

## 2024-02-14 DIAGNOSIS — I340 Nonrheumatic mitral (valve) insufficiency: Secondary | ICD-10-CM

## 2024-02-14 DIAGNOSIS — I4819 Other persistent atrial fibrillation: Secondary | ICD-10-CM

## 2024-02-14 LAB — BASIC METABOLIC PANEL - CANCER CENTER ONLY
Anion gap: 11 (ref 5–15)
BUN: 17 mg/dL (ref 8–23)
CO2: 25 mmol/L (ref 22–32)
Calcium: 9.8 mg/dL (ref 8.9–10.3)
Chloride: 104 mmol/L (ref 98–111)
Creatinine: 0.91 mg/dL (ref 0.44–1.00)
GFR, Estimated: >60 mL/min
Glucose, Bld: 113 mg/dL — ABNORMAL HIGH (ref 70–99)
Potassium: 3.9 mmol/L (ref 3.5–5.1)
Sodium: 140 mmol/L (ref 135–145)

## 2024-02-14 LAB — CBC WITH DIFFERENTIAL (CANCER CENTER ONLY)
Abs Immature Granulocytes: 0.04 K/uL (ref 0.00–0.07)
Basophils Absolute: 0.1 K/uL (ref 0.0–0.1)
Basophils Relative: 1 %
Eosinophils Absolute: 0.2 K/uL (ref 0.0–0.5)
Eosinophils Relative: 3 %
HCT: 33.3 % — ABNORMAL LOW (ref 36.0–46.0)
Hemoglobin: 10.6 g/dL — ABNORMAL LOW (ref 12.0–15.0)
Immature Granulocytes: 1 %
Lymphocytes Relative: 27 %
Lymphs Abs: 1.5 K/uL (ref 0.7–4.0)
MCH: 28.6 pg (ref 26.0–34.0)
MCHC: 31.8 g/dL (ref 30.0–36.0)
MCV: 90 fL (ref 80.0–100.0)
Monocytes Absolute: 0.5 K/uL (ref 0.1–1.0)
Monocytes Relative: 8 %
Neutro Abs: 3.4 K/uL (ref 1.7–7.7)
Neutrophils Relative %: 60 %
Platelet Count: 108 K/uL — ABNORMAL LOW (ref 150–400)
RBC: 3.7 MIL/uL — ABNORMAL LOW (ref 3.87–5.11)
RDW: 15 % (ref 11.5–15.5)
WBC Count: 5.6 K/uL (ref 4.0–10.5)
nRBC: 0 % (ref 0.0–0.2)

## 2024-02-14 LAB — IRON AND TIBC
Iron: 59 ug/dL (ref 28–170)
Saturation Ratios: 20 % (ref 10.4–31.8)
TIBC: 301 ug/dL (ref 250–450)
UIBC: 242 ug/dL

## 2024-02-14 LAB — PRO BRAIN NATRIURETIC PEPTIDE: Pro Brain Natriuretic Peptide: 1436 pg/mL — ABNORMAL HIGH

## 2024-02-14 LAB — FERRITIN: Ferritin: 225 ng/mL (ref 11–307)

## 2024-02-14 NOTE — Progress Notes (Unsigned)
 Results of scan; Blood work

## 2024-02-17 ENCOUNTER — Ambulatory Visit

## 2024-02-17 ENCOUNTER — Telehealth: Payer: Self-pay | Admitting: Pharmacist

## 2024-02-17 ENCOUNTER — Other Ambulatory Visit

## 2024-02-17 ENCOUNTER — Inpatient Hospital Stay (HOSPITAL_BASED_OUTPATIENT_CLINIC_OR_DEPARTMENT_OTHER): Admitting: Internal Medicine

## 2024-02-17 DIAGNOSIS — C8308 Small cell B-cell lymphoma, lymph nodes of multiple sites: Secondary | ICD-10-CM | POA: Diagnosis not present

## 2024-02-17 DIAGNOSIS — D509 Iron deficiency anemia, unspecified: Secondary | ICD-10-CM | POA: Diagnosis not present

## 2024-02-17 NOTE — Assessment & Plan Note (Addendum)
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  # JAN 12th, 2026-   Numerous enlarged lymph nodes throughout the chest, abdomen, and pelvis, not significantly changed;  Mild splenomegaly, unchanged;  Slight interval enlargement of left ovarian mass measuring 5.5 x 4.3 cm, previously 5.0 x 4.1 cm. 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  # Recommend restarting Brukinsa  80 mg 3 pills a day-again reviewed the potential side effects.  Will reach out to pharmacy regarding prescription.  # Iron  deficiency anemia-unclear etiology [suspect chronic bleeding] s/p iron  infusion- improved- not resolved-hemoglobin today is 10.7- continue to PO Iron . HOLD off Venofer .   # Chronic CHF/chronic A . Fib-  Junette Steiner]-on Eliquis  2.5 mg BID-right-sided pleural effusion-mild to moderate recommend continue Lasix  20 mg a day daily per cardiology.  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   # DISPOSITION: # NO venofer   # handicap placard  # follow up in 2   months- MD; labs-cbc/CMP- BNP;LDH;  iron  studies;ferritin; possible venofer -  - Dr.B

## 2024-02-17 NOTE — Telephone Encounter (Signed)
 Clinical Pharmacist Practitioner Encounter   Received new prescription for Brukinsa  (zanubrutinib ) for the treatment of SLL, planned duration until disease progression or unacceptable drug toxicity.  Of note, patient was previously on zanubrutinib  but therapy was stopped d/t intolerance. MD would like to retry patient on medication.   CMP from 01/30/24 assessed, no relevant lab abnormalities. Prescription dose and frequency assessed.   Current medication list in Epic reviewed, one DDIs with zanubrutinib  identified: Apixaban : Zanubrutinib  may increase adverse/toxic effects of Anticoagulants. Monitor patients for signs and symptoms of bleeding if zanubrutinib  and anticoagulants are used concomitantly.  Evaluated chart and no patient barriers to medication adherence identified.   Prescription has been e-scribed to the [facility] for benefits analysis and approval.  Oral Oncology Clinic will continue to follow for insurance authorization, copayment issues, initial counseling and start date.   Hallel Denherder N. Drevon Plog, PharmD, BCOP, CPP Hematology/Oncology Clinical Pharmacist [facility] Oral Chemotherapy Navigation Clinic [phone]  02/17/2024 3:42 PM

## 2024-02-17 NOTE — Progress Notes (Signed)
 I connected with Jacqueline Webb on 02/17/24 at  3:00 PM EST by video enabled telemedicine visit and verified that I am speaking with the correct person using two identifiers.  I discussed the limitations, risks, security and privacy concerns of performing an evaluation and management service by telemedicine and the availability of in-person appointments. I also discussed with the patient that there may be a patient responsible charge related to this service. The patient expressed understanding and agreed to proceed.    Other persons participating in the visit and their role in the encounter: RN/medical reconciliation Patients location: home Providers location: home  Oncology History Overview Note  1. Lymphoma low-grade, status Rituxan  therapy. 2. Recurrent disease by clinical examination in December of 2012 3. Ovarian mass on PET scan in December of 2012 (normal CA 125). Ovarian mass has resolved after rituximab  therapy. 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, 2016) 6, patient started on bendamustine  and Rituxan  because of progressive disease and symptomatic disease (May 11, 2014) 7.  Patient had total 5 cycles of chemotherapy with bendamustine  and rituximab  and 6 cycle was omitted because of significant side effects with and weakness and fatigue.  # OCT 2016- PET significant response; ON surveillance  # 18th OCT 2018- Gazyva ;SEP 2018-/OCT 2018 CT scan- progression; s/p 6 cycles [#6 in April 2019]  # JAN 2020-CT scan shows progressive disease; start a acalabrutinib  100mg  BID;# feb 6th2020- decrease dose of acalbrutinib to 100 mg/day [sec to spon bruising] March 1st week- STOPPED Acala sec to spon brusing  # MARCH 17th 2020- Start Venetoclax  [out pt ramp up]; 300 mg a day; Discontiued  in AUg 2024. CT CAP-N: AUG 2024-progressive lymphadenopathy.   # Brukinsa - MID SEp 2024- 3 pills a day - STOPPED  Brukinsa   sec to spon brusing in April 2025.    # 11/06/2016- colonoscopy [Dr.Byrnett- 2 polyps]  #History of A. Fib-Xarelto; CKD stage II-III.   # #Right posterior neck melanoma stage I [ Dr.Lee/Elkins]..   -------------------------------------------------------------------    # left ? Adnexal mass- ? Etiology; mild SUV uptake incidental [stable compared to previous PET in 2016] Previous workup 2015- vaginal ultrasound negative. MRI February 2018- approximately 3 cm in size; slow increase in the size of the left adnexal mass over at least 3 years- suspect involvement of lymphoma rather than a primary gynecologic malignancy. --------------------------------------------------------------   DIAGNOSIS: SMALL LYMPHOCYTIC LEUKEMIA  STAGE:  IV    ;GOALS: control/pallaitive  CURRENT/MOST RECENT THERAPY: Venatoclax.    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  History of Present Illness   Jacqueline Webb is an 84 year old female with indolent small cell B-cell lymphoma who presents for follow-up of progressive disease.  She is currently off any therapy because of poor tolerance.  Patient was recently admitted to hospital for UTI status post treatment with antibiotics.  Currently back home with her daughter.  She has radiographic evidence of progressive small cell B-cell lymphoma, with increasing lymphadenopathy in the left axilla and abdomen, and a stable to slightly enlarged left ovarian mass. She discontinued Brukinsa  (zanubrutinib ) nearly  one year ago due to significant bruising and is currently not receiving active lymphoma  therapy.  She experiences progressive exertional dyspnea, requiring a cart for support when walking in stores and utilizes a handicap parking spot. She is not short of breath at rest. Imaging demonstrates a mild to moderate chronic right-sided pleural effusion. She takes furosemide  20 mg daily and has an upcoming cardiology follow-up appointment.  She continues to have easy bruising, which was exacerbated by Brukinsa  and persists off the medication. She is also taking Eliquis  for cardiac indications, and her platelet count remains slightly low at 108 x10^9/L.  She has chronic anemia, previously severe during a bleeding episode (hemoglobin nadir 7 g/dL), now improved with a most recent hemoglobin of 10.6 g/dL. She is taking daily slow-release iron  and has not required iron  infusions recently. She has no new symptoms suggestive of worsening anemia.      Observation/objective: Alert & oriented x 3. In No acute distress.   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  # JAN 12th, 2026-   Numerous enlarged lymph nodes throughout the chest, abdomen, and pelvis, not significantly changed;  Mild splenomegaly, unchanged;  Slight interval enlargement of left ovarian mass measuring 5.5 x 4.3 cm, previously 5.0 x 4.1 cm. 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  # Recommend restarting Brukinsa  80 mg 3 pills a day-again reviewed the potential side effects.  Will reach out to pharmacy regarding prescription.  # Iron  deficiency anemia-unclear etiology [suspect chronic  bleeding] s/p iron  infusion- improved- not resolved-hemoglobin today is 10.7- continue to PO Iron . HOLD off Venofer .   # Chronic CHF/chronic A . Fib-  Jacqueline Webb]-on Eliquis  2.5 mg BID-right-sided pleural effusion-mild to moderate recommend continue Lasix  20 mg a day daily per cardiology.  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   # DISPOSITION: # NO venofer   # handicap placard  # follow up in 2   months- MD; labs-cbc/CMP- BNP;LDH;  iron  studies;ferritin; possible venofer -  - Dr.B  Follow-up instructions:  I discussed the assessment and treatment plan with the patient.  The patient was provided an opportunity to ask questions and all were answered.  The patient agreed with the plan and demonstrated understanding of instructions.  The patient was advised to call back or seek an in person evaluation if the symptoms worsen or if the condition fails to improve as anticipated.    Dr. Glynn Freas [facility] at [facility] 02/17/2024 3:20 PM

## 2024-02-18 ENCOUNTER — Other Ambulatory Visit (HOSPITAL_COMMUNITY): Payer: Self-pay

## 2024-02-18 ENCOUNTER — Encounter: Payer: Self-pay | Admitting: Internal Medicine

## 2024-02-18 ENCOUNTER — Telehealth: Payer: Self-pay | Admitting: Pharmacy Technician

## 2024-02-18 NOTE — Addendum Note (Signed)
 Addended by: LAEL BROWNING A on: 02/18/2024 11:00 AM   Modules accepted: Orders

## 2024-02-18 NOTE — Telephone Encounter (Signed)
 Oral Oncology Patient Advocate Encounter  After completing a benefits investigation, prior authorization for Brukinsa  is not required at this time through [facility].  Patient's copay is $2,031.86.     Radwan Cowley (Patty) Chet Burnet, CPhT  [facility], [name], [facility] - Oral Chemotherapy Patient Advocate Specialist III Phone: [phone]  Fax: [phone]

## 2024-02-18 NOTE — Telephone Encounter (Signed)
 Oral Oncology Patient Advocate Encounter  Was successful in securing patient a $4,500 grant from [employer] to provide copayment coverage for Brukinsa .  This will keep the out of pocket expense at $0.     The billing information is as follows and has been shared with [facility].    RxBin: [id number] PCN: [id number] Member ID: [id number] Group ID: [id number] Dates of Eligibility: 11/20/2023 through 02/17/2025    Fund:  CLL  Jacqueline Webb (Jacqueline Webb) Jacqueline Webb, CPhT  [facility], Jacqueline Webb, [facility] - Oral Chemotherapy Patient Advocate Specialist III Phone: [phone]  Fax: [phone]

## 2024-02-19 ENCOUNTER — Telehealth: Payer: Self-pay | Admitting: Pharmacist

## 2024-02-19 ENCOUNTER — Other Ambulatory Visit: Payer: Self-pay

## 2024-02-19 ENCOUNTER — Other Ambulatory Visit: Payer: Self-pay | Admitting: Pharmacy Technician

## 2024-02-19 DIAGNOSIS — C8308 Small cell B-cell lymphoma, lymph nodes of multiple sites: Secondary | ICD-10-CM

## 2024-02-19 MED ORDER — ZANUBRUTINIB 160 MG PO TABS
240.0000 mg | ORAL_TABLET | Freq: Every day | ORAL | 1 refills | Status: AC
  Filled 2024-02-19: qty 45, 30d supply, fill #0

## 2024-02-19 NOTE — Progress Notes (Signed)
 Patient education documented in EPIC note on 02/19/24.

## 2024-02-19 NOTE — Progress Notes (Signed)
 Specialty Pharmacy Initial Fill Coordination Note  Jacqueline Webb is a 84 y.o. female contacted today regarding initial fill of specialty medication(s) Zanubrutinib  (BRUKINSA )   Patient requested Delivery   Delivery date: 02/21/24   Verified address: [address] [place] [address]   Medication will be filled on: 02/20/24   Patient is aware of $0 copayment. Leisure centre manager.  [name] Chet Burnet, CPhT  [facility], [name], [facility] - Oral Chemotherapy Patient Advocate Specialist III Phone: [phone]  Fax: [phone]

## 2024-02-19 NOTE — Patient Instructions (Signed)
 [facility] - A DEPT OF [facility]    Thank you for choosing [facility] to provide your oncology/hematology care and for allowing us  to participate in your care today!  As a reminder, we spoke about the following today: Brukinsa  (zanubrutinib ) for the treatment of SLL, planned duration until disease progression or unacceptable drug toxicity.   Treatment goal: Control  Medication handout has been provided.   **For oral cancer medication prescription refill requests, contact your pharmacy and they will contact our office if needed. Allow 5-7 days for refills to be completed by your specialty pharmacy.    Cancer Center General Instructions:  If you have an appointment at the [facility], please go directly to the Cancer Center and check in at the registration area.  We strive to give you quality time with your provider. You may need to reschedule your appointment if you arrive late (15 or more minutes).  Arriving late affects you and other patients whose appointments are after yours.  Also, if you miss three or more appointments without notifying the office, you may be dismissed from the clinic at the provider's discretion.      BELOW ARE SYMPTOMS THAT SHOULD BE REPORTED IMMEDIATELY: *FEVER GREATER THAN 100.4 F (38 C) OR HIGHER *CHILLS OR SWEATING *NAUSEA AND VOMITING THAT IS NOT CONTROLLED WITH YOUR NAUSEA MEDICATION *UNUSUAL SHORTNESS OF BREATH *UNUSUAL BRUISING OR BLEEDING *URINARY PROBLEMS (pain or burning when urinating, or frequent urination) *BOWEL PROBLEMS (unusual diarrhea, constipation, pain near the anus) TENDERNESS IN MOUTH AND THROAT WITH OR WITHOUT PRESENCE OF ULCERS (sore throat, sores in mouth, or a toothache) UNUSUAL RASH, SWELLING OR PAIN  UNUSUAL VAGINAL DISCHARGE OR ITCHING   Items with * indicate a potential emergency and should be followed up as soon as possible or go to the Emergency Department if any problems  should occur.  Please show the CHEMOTHERAPY ALERT CARD at check-in to the Emergency Department and triage nurse.  Should you have questions after your visit or need to cancel or reschedule your appointment, please contact [facility] - A DEPT OF [facility]  [phone] and follow the prompts.  Office hours are 8:00 a.m. to 4:30 p.m. Monday - Friday. Please note that voicemails left after 4:00 p.m. may not be returned until the following business day.  We are closed weekends and major holidays. You have access to a nurse at all times for urgent questions. Please call the main number to the clinic [phone] and follow the prompts.  For any non-urgent questions, you may also contact your provider using MyChart. We now offer e-Visits for anyone 51 and older to request care online for non-urgent symptoms. For details visit [email or web address].   Also download the MyChart app! Go to the app store, search MyChart, open the app, select [place], and log in with your MyChart username and password.

## 2024-02-19 NOTE — Progress Notes (Signed)
 Clinical Pharmacist Practitioner Encounter   [facility] (Specialty[facility] will deliver medication to patient on 02/21/24.  Patient knows to start once they have medication in hand.   Patient Education I spoke with patient for overview of new oral chemotherapy medication: Brukinsa  (zanubrutinib ) for the treatment of SLL, planned duration until disease progression or unacceptable drug toxicity.   Treatment goal: Control  Counseled patient on administration, dosing, side effects, monitoring, drug-food interactions, safe handling, storage, and disposal. Patient will take 1.5 tablets (240 mg total) by mouth daily. .  Side effects include but not limited to: rash, decreased wbc/hgb/plt, fatigue.    Reviewed with patient importance of keeping a medication schedule and plan for any missed doses.  After discussion with patient no patient barriers to medication adherence identified.   Distress evaluation: Distress thermometer not completed during telephone call as patient has been on previous lines of therapy.   Communication and Learning Assessment Primary learner: Patient Barriers to learning: No barriers Preferred language: English Learning preferences: Listening Reading   Mr. Sebastian voiced understanding and appreciation. All questions answered. Medication handout provided.  Provided patient with Oral Chemotherapy Navigation Clinic phone number. Patient knows to call the office with questions or concerns. Reviewed with patient the expectations for rescheduling or cancelling appointments.  Oral Chemotherapy Navigation Clinic will continue to follow.  Soma Bachand N. Tziporah Knoke, PharmD, BCOP, CPP Hematology/Oncology Clinical Pharmacist [facility] Oral Chemotherapy Navigation Clinic [phone]  02/19/2024 12:43 PM

## 2024-02-20 ENCOUNTER — Other Ambulatory Visit: Payer: Self-pay

## 2024-02-26 ENCOUNTER — Other Ambulatory Visit (HOSPITAL_COMMUNITY): Payer: Self-pay

## 2024-04-17 ENCOUNTER — Inpatient Hospital Stay

## 2024-04-17 ENCOUNTER — Inpatient Hospital Stay: Admitting: Internal Medicine
